# Patient Record
Sex: Male | Born: 1935
Health system: Southern US, Community
[De-identification: ages and names within clinical notes are randomized; demographics above are authoritative.]

## PROBLEM LIST (undated history)

## (undated) DIAGNOSIS — Z8521 Personal history of malignant neoplasm of larynx: Secondary | ICD-10-CM

## (undated) DIAGNOSIS — G4733 Obstructive sleep apnea (adult) (pediatric): Secondary | ICD-10-CM

## (undated) DIAGNOSIS — L570 Actinic keratosis: Secondary | ICD-10-CM

## (undated) DIAGNOSIS — K219 Gastro-esophageal reflux disease without esophagitis: Secondary | ICD-10-CM

## (undated) DIAGNOSIS — I10 Essential (primary) hypertension: Secondary | ICD-10-CM

## (undated) DIAGNOSIS — M109 Gout, unspecified: Secondary | ICD-10-CM

## (undated) DIAGNOSIS — J302 Other seasonal allergic rhinitis: Secondary | ICD-10-CM

## (undated) DIAGNOSIS — R7303 Prediabetes: Secondary | ICD-10-CM

## (undated) DIAGNOSIS — Z85828 Personal history of other malignant neoplasm of skin: Secondary | ICD-10-CM

## (undated) DIAGNOSIS — Z9989 Dependence on other enabling machines and devices: Principal | ICD-10-CM

## (undated) DIAGNOSIS — M1990 Unspecified osteoarthritis, unspecified site: Secondary | ICD-10-CM

## (undated) DIAGNOSIS — T8859XA Other complications of anesthesia, initial encounter: Secondary | ICD-10-CM

## (undated) DIAGNOSIS — K5792 Diverticulitis of intestine, part unspecified, without perforation or abscess without bleeding: Secondary | ICD-10-CM

## (undated) DIAGNOSIS — H903 Sensorineural hearing loss, bilateral: Secondary | ICD-10-CM

## (undated) DIAGNOSIS — Z8739 Personal history of other diseases of the musculoskeletal system and connective tissue: Secondary | ICD-10-CM

## (undated) DIAGNOSIS — C4492 Squamous cell carcinoma of skin, unspecified: Secondary | ICD-10-CM

## (undated) DIAGNOSIS — I6523 Occlusion and stenosis of bilateral carotid arteries: Secondary | ICD-10-CM

## (undated) DIAGNOSIS — T4145XA Adverse effect of unspecified anesthetic, initial encounter: Secondary | ICD-10-CM

## (undated) HISTORY — DX: Personal history of other malignant neoplasm of skin: Z85.828

## (undated) HISTORY — DX: Unspecified osteoarthritis, unspecified site: M19.90

## (undated) HISTORY — PX: APPENDECTOMY: SHX54

## (undated) HISTORY — DX: Squamous cell carcinoma of skin, unspecified: C44.92

## (undated) HISTORY — DX: Personal history of malignant neoplasm of larynx: Z85.21

## (undated) HISTORY — PX: COLONOSCOPY: SHX174

## (undated) HISTORY — DX: Other seasonal allergic rhinitis: J30.2

## (undated) HISTORY — DX: Gastro-esophageal reflux disease without esophagitis: K21.9

## (undated) HISTORY — DX: Actinic keratosis: L57.0

## (undated) HISTORY — DX: Gout, unspecified: M10.9

## (undated) HISTORY — DX: Sensorineural hearing loss, bilateral: H90.3

## (undated) HISTORY — DX: Essential (primary) hypertension: I10

## (undated) HISTORY — PX: ACHILLES TENDON REPAIR: SUR1153

## (undated) HISTORY — PX: TREATMENT FISTULA ANAL: SUR1390

## (undated) HISTORY — PX: TONSILLECTOMY: SUR1361

## (undated) HISTORY — DX: Obstructive sleep apnea (adult) (pediatric): G47.33

## (undated) HISTORY — PX: CHOLECYSTECTOMY: SHX55

## (undated) HISTORY — DX: Diverticulitis of intestine, part unspecified, without perforation or abscess without bleeding: K57.92

## (undated) HISTORY — DX: Dependence on other enabling machines and devices: Z99.89

## (undated) HISTORY — DX: Personal history of other diseases of the musculoskeletal system and connective tissue: Z87.39

## (undated) HISTORY — DX: Occlusion and stenosis of bilateral carotid arteries: I65.23

---
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
## 2007-05-09 DIAGNOSIS — Z8521 Personal history of malignant neoplasm of larynx: Secondary | ICD-10-CM

## 2007-05-09 HISTORY — DX: Personal history of malignant neoplasm of larynx: Z85.21

## 2007-05-09 HISTORY — PX: THROAT SURGERY: SHX803

## 2013-05-08 DIAGNOSIS — L089 Local infection of the skin and subcutaneous tissue, unspecified: Secondary | ICD-10-CM | POA: Insufficient documentation

## 2013-05-08 DIAGNOSIS — B9562 Methicillin resistant Staphylococcus aureus infection as the cause of diseases classified elsewhere: Secondary | ICD-10-CM

## 2013-05-08 HISTORY — DX: Methicillin resistant Staphylococcus aureus infection as the cause of diseases classified elsewhere: B95.62

## 2013-05-08 HISTORY — DX: Local infection of the skin and subcutaneous tissue, unspecified: L08.9

## 2014-05-09 DIAGNOSIS — J019 Acute sinusitis, unspecified: Secondary | ICD-10-CM | POA: Diagnosis not present

## 2014-05-09 DIAGNOSIS — J209 Acute bronchitis, unspecified: Secondary | ICD-10-CM | POA: Diagnosis not present

## 2014-05-26 DIAGNOSIS — J209 Acute bronchitis, unspecified: Secondary | ICD-10-CM | POA: Diagnosis not present

## 2014-06-04 DIAGNOSIS — H33311 Horseshoe tear of retina without detachment, right eye: Secondary | ICD-10-CM | POA: Diagnosis not present

## 2014-06-04 DIAGNOSIS — H43813 Vitreous degeneration, bilateral: Secondary | ICD-10-CM | POA: Diagnosis not present

## 2014-06-04 DIAGNOSIS — D3131 Benign neoplasm of right choroid: Secondary | ICD-10-CM | POA: Diagnosis not present

## 2014-07-01 DIAGNOSIS — J069 Acute upper respiratory infection, unspecified: Secondary | ICD-10-CM | POA: Diagnosis not present

## 2014-07-14 DIAGNOSIS — L570 Actinic keratosis: Secondary | ICD-10-CM | POA: Diagnosis not present

## 2014-07-16 DIAGNOSIS — I10 Essential (primary) hypertension: Secondary | ICD-10-CM | POA: Diagnosis not present

## 2014-07-16 DIAGNOSIS — R6889 Other general symptoms and signs: Secondary | ICD-10-CM | POA: Diagnosis not present

## 2014-07-16 DIAGNOSIS — M1000 Idiopathic gout, unspecified site: Secondary | ICD-10-CM | POA: Diagnosis not present

## 2014-07-16 DIAGNOSIS — Z79899 Other long term (current) drug therapy: Secondary | ICD-10-CM | POA: Diagnosis not present

## 2014-07-21 DIAGNOSIS — M1000 Idiopathic gout, unspecified site: Secondary | ICD-10-CM | POA: Diagnosis not present

## 2014-07-21 DIAGNOSIS — I10 Essential (primary) hypertension: Secondary | ICD-10-CM | POA: Diagnosis not present

## 2014-08-03 DIAGNOSIS — S335XXA Sprain of ligaments of lumbar spine, initial encounter: Secondary | ICD-10-CM | POA: Diagnosis not present

## 2014-08-03 DIAGNOSIS — S134XXA Sprain of ligaments of cervical spine, initial encounter: Secondary | ICD-10-CM | POA: Diagnosis not present

## 2014-08-03 DIAGNOSIS — M9903 Segmental and somatic dysfunction of lumbar region: Secondary | ICD-10-CM | POA: Diagnosis not present

## 2014-08-03 DIAGNOSIS — M9905 Segmental and somatic dysfunction of pelvic region: Secondary | ICD-10-CM | POA: Diagnosis not present

## 2014-08-03 DIAGNOSIS — M9901 Segmental and somatic dysfunction of cervical region: Secondary | ICD-10-CM | POA: Diagnosis not present

## 2014-08-03 DIAGNOSIS — M5441 Lumbago with sciatica, right side: Secondary | ICD-10-CM | POA: Diagnosis not present

## 2014-08-04 DIAGNOSIS — M25551 Pain in right hip: Secondary | ICD-10-CM | POA: Diagnosis not present

## 2014-08-05 DIAGNOSIS — T148 Other injury of unspecified body region: Secondary | ICD-10-CM | POA: Diagnosis not present

## 2014-11-08 DIAGNOSIS — T25299A Burn of second degree of multiple sites of unspecified ankle and foot, initial encounter: Secondary | ICD-10-CM | POA: Diagnosis not present

## 2014-11-10 DIAGNOSIS — T25299D Burn of second degree of multiple sites of unspecified ankle and foot, subsequent encounter: Secondary | ICD-10-CM | POA: Diagnosis not present

## 2014-12-14 DIAGNOSIS — Z961 Presence of intraocular lens: Secondary | ICD-10-CM | POA: Diagnosis not present

## 2014-12-14 DIAGNOSIS — H04123 Dry eye syndrome of bilateral lacrimal glands: Secondary | ICD-10-CM | POA: Diagnosis not present

## 2014-12-24 DIAGNOSIS — C320 Malignant neoplasm of glottis: Secondary | ICD-10-CM | POA: Diagnosis not present

## 2015-01-25 DIAGNOSIS — H905 Unspecified sensorineural hearing loss: Secondary | ICD-10-CM | POA: Diagnosis not present

## 2015-01-25 DIAGNOSIS — H903 Sensorineural hearing loss, bilateral: Secondary | ICD-10-CM | POA: Diagnosis not present

## 2015-02-16 DIAGNOSIS — B356 Tinea cruris: Secondary | ICD-10-CM | POA: Diagnosis not present

## 2015-02-16 DIAGNOSIS — L821 Other seborrheic keratosis: Secondary | ICD-10-CM | POA: Diagnosis not present

## 2015-02-16 DIAGNOSIS — L570 Actinic keratosis: Secondary | ICD-10-CM | POA: Diagnosis not present

## 2015-02-17 DIAGNOSIS — M9905 Segmental and somatic dysfunction of pelvic region: Secondary | ICD-10-CM | POA: Diagnosis not present

## 2015-02-17 DIAGNOSIS — M62830 Muscle spasm of back: Secondary | ICD-10-CM | POA: Diagnosis not present

## 2015-02-17 DIAGNOSIS — M5441 Lumbago with sciatica, right side: Secondary | ICD-10-CM | POA: Diagnosis not present

## 2015-02-17 DIAGNOSIS — M9903 Segmental and somatic dysfunction of lumbar region: Secondary | ICD-10-CM | POA: Diagnosis not present

## 2015-03-04 DIAGNOSIS — Z23 Encounter for immunization: Secondary | ICD-10-CM | POA: Diagnosis not present

## 2015-03-05 DIAGNOSIS — I10 Essential (primary) hypertension: Secondary | ICD-10-CM | POA: Diagnosis not present

## 2015-03-05 DIAGNOSIS — X030XXA Exposure to flames in controlled fire, not in building or structure, initial encounter: Secondary | ICD-10-CM | POA: Diagnosis not present

## 2015-03-05 DIAGNOSIS — J309 Allergic rhinitis, unspecified: Secondary | ICD-10-CM | POA: Diagnosis not present

## 2015-03-05 DIAGNOSIS — T25232A Burn of second degree of left toe(s) (nail), initial encounter: Secondary | ICD-10-CM | POA: Diagnosis not present

## 2015-03-05 DIAGNOSIS — Z79899 Other long term (current) drug therapy: Secondary | ICD-10-CM | POA: Diagnosis not present

## 2015-03-05 DIAGNOSIS — C320 Malignant neoplasm of glottis: Secondary | ICD-10-CM | POA: Diagnosis not present

## 2015-03-05 DIAGNOSIS — M109 Gout, unspecified: Secondary | ICD-10-CM | POA: Diagnosis not present

## 2015-03-05 DIAGNOSIS — T310 Burns involving less than 10% of body surface: Secondary | ICD-10-CM | POA: Diagnosis not present

## 2015-03-05 DIAGNOSIS — T25231A Burn of second degree of right toe(s) (nail), initial encounter: Secondary | ICD-10-CM | POA: Diagnosis not present

## 2015-03-05 DIAGNOSIS — M1000 Idiopathic gout, unspecified site: Secondary | ICD-10-CM | POA: Diagnosis not present

## 2015-03-05 DIAGNOSIS — T25022A Burn of unspecified degree of left foot, initial encounter: Secondary | ICD-10-CM | POA: Diagnosis not present

## 2015-03-05 DIAGNOSIS — T25021A Burn of unspecified degree of right foot, initial encounter: Secondary | ICD-10-CM | POA: Diagnosis not present

## 2015-03-12 DIAGNOSIS — I10 Essential (primary) hypertension: Secondary | ICD-10-CM | POA: Diagnosis not present

## 2015-03-12 DIAGNOSIS — R7309 Other abnormal glucose: Secondary | ICD-10-CM | POA: Diagnosis not present

## 2015-03-12 LAB — CBC AND DIFFERENTIAL
HEMOGLOBIN: 13.2 g/dL — AB (ref 13.5–17.5)
PLATELETS: 224 10*3/uL (ref 150–399)
WBC: 4.7 10^3/mL

## 2015-03-12 LAB — VITAMIN D 25 HYDROXY (VIT D DEFICIENCY, FRACTURES): VIT D 25 HYDROXY: 39.4

## 2015-03-12 LAB — LIPID PANEL
Cholesterol: 137 mg/dL (ref 0–200)
HDL: 34 mg/dL — AB (ref 35–70)
LDL CALC: 68 mg/dL
Triglycerides: 173 mg/dL — AB (ref 40–160)

## 2015-03-12 LAB — HEPATIC FUNCTION PANEL
ALK PHOS: 53 U/L (ref 25–125)
ALT: 37 U/L (ref 10–40)
AST: 38 U/L (ref 14–40)
BILIRUBIN, TOTAL: 0.7 mg/dL

## 2015-03-12 LAB — TSH: TSH: 3.42 u[IU]/mL (ref 0.41–5.90)

## 2015-03-12 LAB — BASIC METABOLIC PANEL: CREATININE: 1 mg/dL (ref <=1.3)

## 2015-03-12 LAB — HEMOGLOBIN A1C: Hemoglobin A1C: 5.9

## 2015-03-17 DIAGNOSIS — M62830 Muscle spasm of back: Secondary | ICD-10-CM | POA: Diagnosis not present

## 2015-03-17 DIAGNOSIS — M5441 Lumbago with sciatica, right side: Secondary | ICD-10-CM | POA: Diagnosis not present

## 2015-03-17 DIAGNOSIS — M9905 Segmental and somatic dysfunction of pelvic region: Secondary | ICD-10-CM | POA: Diagnosis not present

## 2015-03-17 DIAGNOSIS — M9903 Segmental and somatic dysfunction of lumbar region: Secondary | ICD-10-CM | POA: Diagnosis not present

## 2015-03-18 DIAGNOSIS — M109 Gout, unspecified: Secondary | ICD-10-CM | POA: Diagnosis not present

## 2015-03-18 DIAGNOSIS — T25331A Burn of third degree of right toe(s) (nail), initial encounter: Secondary | ICD-10-CM | POA: Diagnosis not present

## 2015-03-18 DIAGNOSIS — T25131A Burn of first degree of right toe(s) (nail), initial encounter: Secondary | ICD-10-CM | POA: Diagnosis not present

## 2015-03-18 DIAGNOSIS — T25332A Burn of third degree of left toe(s) (nail), initial encounter: Secondary | ICD-10-CM | POA: Diagnosis not present

## 2015-03-18 DIAGNOSIS — I10 Essential (primary) hypertension: Secondary | ICD-10-CM | POA: Diagnosis not present

## 2015-03-18 DIAGNOSIS — T310 Burns involving less than 10% of body surface: Secondary | ICD-10-CM | POA: Diagnosis not present

## 2015-03-22 DIAGNOSIS — M5441 Lumbago with sciatica, right side: Secondary | ICD-10-CM | POA: Diagnosis not present

## 2015-03-22 DIAGNOSIS — M9905 Segmental and somatic dysfunction of pelvic region: Secondary | ICD-10-CM | POA: Diagnosis not present

## 2015-03-22 DIAGNOSIS — M9903 Segmental and somatic dysfunction of lumbar region: Secondary | ICD-10-CM | POA: Diagnosis not present

## 2015-03-22 DIAGNOSIS — M62830 Muscle spasm of back: Secondary | ICD-10-CM | POA: Diagnosis not present

## 2015-04-08 DIAGNOSIS — T25331A Burn of third degree of right toe(s) (nail), initial encounter: Secondary | ICD-10-CM | POA: Diagnosis not present

## 2015-04-08 DIAGNOSIS — I10 Essential (primary) hypertension: Secondary | ICD-10-CM | POA: Diagnosis not present

## 2015-04-08 DIAGNOSIS — T25332A Burn of third degree of left toe(s) (nail), initial encounter: Secondary | ICD-10-CM | POA: Diagnosis not present

## 2015-04-08 DIAGNOSIS — T310 Burns involving less than 10% of body surface: Secondary | ICD-10-CM | POA: Diagnosis not present

## 2015-04-08 DIAGNOSIS — M109 Gout, unspecified: Secondary | ICD-10-CM | POA: Diagnosis not present

## 2015-04-08 DIAGNOSIS — T25131A Burn of first degree of right toe(s) (nail), initial encounter: Secondary | ICD-10-CM | POA: Diagnosis not present

## 2015-04-16 DIAGNOSIS — B001 Herpesviral vesicular dermatitis: Secondary | ICD-10-CM | POA: Diagnosis not present

## 2015-04-16 DIAGNOSIS — L309 Dermatitis, unspecified: Secondary | ICD-10-CM | POA: Diagnosis not present

## 2015-04-22 DIAGNOSIS — T25332A Burn of third degree of left toe(s) (nail), initial encounter: Secondary | ICD-10-CM | POA: Diagnosis not present

## 2015-04-22 DIAGNOSIS — T25131A Burn of first degree of right toe(s) (nail), initial encounter: Secondary | ICD-10-CM | POA: Diagnosis not present

## 2015-04-22 DIAGNOSIS — T25331A Burn of third degree of right toe(s) (nail), initial encounter: Secondary | ICD-10-CM | POA: Diagnosis not present

## 2015-04-22 DIAGNOSIS — I10 Essential (primary) hypertension: Secondary | ICD-10-CM | POA: Diagnosis not present

## 2015-04-22 DIAGNOSIS — T310 Burns involving less than 10% of body surface: Secondary | ICD-10-CM | POA: Diagnosis not present

## 2015-04-22 DIAGNOSIS — M109 Gout, unspecified: Secondary | ICD-10-CM | POA: Diagnosis not present

## 2015-05-11 DIAGNOSIS — M9903 Segmental and somatic dysfunction of lumbar region: Secondary | ICD-10-CM | POA: Diagnosis not present

## 2015-05-11 DIAGNOSIS — I10 Essential (primary) hypertension: Secondary | ICD-10-CM | POA: Diagnosis not present

## 2015-05-11 DIAGNOSIS — M9905 Segmental and somatic dysfunction of pelvic region: Secondary | ICD-10-CM | POA: Diagnosis not present

## 2015-05-11 DIAGNOSIS — S39012A Strain of muscle, fascia and tendon of lower back, initial encounter: Secondary | ICD-10-CM | POA: Diagnosis not present

## 2015-05-11 DIAGNOSIS — J309 Allergic rhinitis, unspecified: Secondary | ICD-10-CM | POA: Diagnosis not present

## 2015-05-11 DIAGNOSIS — C320 Malignant neoplasm of glottis: Secondary | ICD-10-CM | POA: Diagnosis not present

## 2015-05-11 DIAGNOSIS — M5441 Lumbago with sciatica, right side: Secondary | ICD-10-CM | POA: Diagnosis not present

## 2015-05-13 DIAGNOSIS — M5441 Lumbago with sciatica, right side: Secondary | ICD-10-CM | POA: Diagnosis not present

## 2015-05-13 DIAGNOSIS — M9905 Segmental and somatic dysfunction of pelvic region: Secondary | ICD-10-CM | POA: Diagnosis not present

## 2015-05-13 DIAGNOSIS — S39012A Strain of muscle, fascia and tendon of lower back, initial encounter: Secondary | ICD-10-CM | POA: Diagnosis not present

## 2015-05-13 DIAGNOSIS — M9903 Segmental and somatic dysfunction of lumbar region: Secondary | ICD-10-CM | POA: Diagnosis not present

## 2015-05-17 DIAGNOSIS — M9903 Segmental and somatic dysfunction of lumbar region: Secondary | ICD-10-CM | POA: Diagnosis not present

## 2015-05-17 DIAGNOSIS — M9905 Segmental and somatic dysfunction of pelvic region: Secondary | ICD-10-CM | POA: Diagnosis not present

## 2015-05-17 DIAGNOSIS — S39012A Strain of muscle, fascia and tendon of lower back, initial encounter: Secondary | ICD-10-CM | POA: Diagnosis not present

## 2015-05-17 DIAGNOSIS — M5441 Lumbago with sciatica, right side: Secondary | ICD-10-CM | POA: Diagnosis not present

## 2015-05-18 DIAGNOSIS — T25332A Burn of third degree of left toe(s) (nail), initial encounter: Secondary | ICD-10-CM | POA: Diagnosis not present

## 2015-05-18 DIAGNOSIS — T310 Burns involving less than 10% of body surface: Secondary | ICD-10-CM | POA: Diagnosis not present

## 2015-05-18 DIAGNOSIS — T25331A Burn of third degree of right toe(s) (nail), initial encounter: Secondary | ICD-10-CM | POA: Diagnosis not present

## 2015-05-18 DIAGNOSIS — Z79899 Other long term (current) drug therapy: Secondary | ICD-10-CM | POA: Diagnosis not present

## 2015-05-18 DIAGNOSIS — I10 Essential (primary) hypertension: Secondary | ICD-10-CM | POA: Diagnosis not present

## 2015-05-18 DIAGNOSIS — M109 Gout, unspecified: Secondary | ICD-10-CM | POA: Diagnosis not present

## 2015-05-20 DIAGNOSIS — M5441 Lumbago with sciatica, right side: Secondary | ICD-10-CM | POA: Diagnosis not present

## 2015-05-20 DIAGNOSIS — M9905 Segmental and somatic dysfunction of pelvic region: Secondary | ICD-10-CM | POA: Diagnosis not present

## 2015-05-20 DIAGNOSIS — M9903 Segmental and somatic dysfunction of lumbar region: Secondary | ICD-10-CM | POA: Diagnosis not present

## 2015-05-20 DIAGNOSIS — S39012A Strain of muscle, fascia and tendon of lower back, initial encounter: Secondary | ICD-10-CM | POA: Diagnosis not present

## 2015-06-08 DIAGNOSIS — Z79899 Other long term (current) drug therapy: Secondary | ICD-10-CM | POA: Diagnosis not present

## 2015-06-08 DIAGNOSIS — T25331A Burn of third degree of right toe(s) (nail), initial encounter: Secondary | ICD-10-CM | POA: Diagnosis not present

## 2015-06-08 DIAGNOSIS — T310 Burns involving less than 10% of body surface: Secondary | ICD-10-CM | POA: Diagnosis not present

## 2015-06-08 DIAGNOSIS — T25332A Burn of third degree of left toe(s) (nail), initial encounter: Secondary | ICD-10-CM | POA: Diagnosis not present

## 2015-06-08 DIAGNOSIS — I10 Essential (primary) hypertension: Secondary | ICD-10-CM | POA: Diagnosis not present

## 2015-06-08 DIAGNOSIS — M109 Gout, unspecified: Secondary | ICD-10-CM | POA: Diagnosis not present

## 2015-06-24 DIAGNOSIS — I10 Essential (primary) hypertension: Secondary | ICD-10-CM | POA: Diagnosis not present

## 2015-06-24 DIAGNOSIS — T25332A Burn of third degree of left toe(s) (nail), initial encounter: Secondary | ICD-10-CM | POA: Diagnosis not present

## 2015-06-24 DIAGNOSIS — T310 Burns involving less than 10% of body surface: Secondary | ICD-10-CM | POA: Diagnosis not present

## 2015-06-24 DIAGNOSIS — M109 Gout, unspecified: Secondary | ICD-10-CM | POA: Diagnosis not present

## 2015-06-24 DIAGNOSIS — T25331A Burn of third degree of right toe(s) (nail), initial encounter: Secondary | ICD-10-CM | POA: Diagnosis not present

## 2015-06-28 DIAGNOSIS — J069 Acute upper respiratory infection, unspecified: Secondary | ICD-10-CM | POA: Diagnosis not present

## 2015-07-22 DIAGNOSIS — E669 Obesity, unspecified: Secondary | ICD-10-CM | POA: Diagnosis not present

## 2015-07-22 DIAGNOSIS — I10 Essential (primary) hypertension: Secondary | ICD-10-CM | POA: Diagnosis not present

## 2015-07-22 DIAGNOSIS — T310 Burns involving less than 10% of body surface: Secondary | ICD-10-CM | POA: Diagnosis not present

## 2015-07-22 DIAGNOSIS — T25331A Burn of third degree of right toe(s) (nail), initial encounter: Secondary | ICD-10-CM | POA: Diagnosis not present

## 2015-07-22 DIAGNOSIS — M109 Gout, unspecified: Secondary | ICD-10-CM | POA: Diagnosis not present

## 2015-07-22 DIAGNOSIS — T25332A Burn of third degree of left toe(s) (nail), initial encounter: Secondary | ICD-10-CM | POA: Diagnosis not present

## 2015-07-22 DIAGNOSIS — Z79899 Other long term (current) drug therapy: Secondary | ICD-10-CM | POA: Diagnosis not present

## 2015-07-27 DIAGNOSIS — L570 Actinic keratosis: Secondary | ICD-10-CM | POA: Diagnosis not present

## 2015-07-27 DIAGNOSIS — L821 Other seborrheic keratosis: Secondary | ICD-10-CM | POA: Diagnosis not present

## 2015-08-30 DIAGNOSIS — J019 Acute sinusitis, unspecified: Secondary | ICD-10-CM | POA: Diagnosis not present

## 2015-09-17 DIAGNOSIS — M86172 Other acute osteomyelitis, left ankle and foot: Secondary | ICD-10-CM | POA: Diagnosis not present

## 2015-09-17 DIAGNOSIS — M868X7 Other osteomyelitis, ankle and foot: Secondary | ICD-10-CM | POA: Diagnosis present

## 2015-09-17 DIAGNOSIS — T25331S Burn of third degree of right toe(s) (nail), sequela: Secondary | ICD-10-CM | POA: Diagnosis not present

## 2015-09-17 DIAGNOSIS — M868X8 Other osteomyelitis, other site: Secondary | ICD-10-CM | POA: Diagnosis not present

## 2015-09-17 DIAGNOSIS — I10 Essential (primary) hypertension: Secondary | ICD-10-CM | POA: Diagnosis present

## 2015-09-17 DIAGNOSIS — L97529 Non-pressure chronic ulcer of other part of left foot with unspecified severity: Secondary | ICD-10-CM | POA: Diagnosis not present

## 2015-09-17 DIAGNOSIS — T25332S Burn of third degree of left toe(s) (nail), sequela: Secondary | ICD-10-CM | POA: Diagnosis not present

## 2015-09-17 DIAGNOSIS — R600 Localized edema: Secondary | ICD-10-CM | POA: Diagnosis not present

## 2015-09-17 DIAGNOSIS — X038XXS Other exposure to controlled fire, not in building or structure, sequela: Secondary | ICD-10-CM | POA: Diagnosis not present

## 2015-09-17 DIAGNOSIS — L089 Local infection of the skin and subcutaneous tissue, unspecified: Secondary | ICD-10-CM | POA: Diagnosis not present

## 2015-09-17 DIAGNOSIS — L03032 Cellulitis of left toe: Secondary | ICD-10-CM | POA: Diagnosis present

## 2015-09-17 DIAGNOSIS — M109 Gout, unspecified: Secondary | ICD-10-CM | POA: Diagnosis present

## 2015-09-20 DIAGNOSIS — M868X7 Other osteomyelitis, ankle and foot: Secondary | ICD-10-CM | POA: Diagnosis not present

## 2015-09-20 DIAGNOSIS — L03032 Cellulitis of left toe: Secondary | ICD-10-CM | POA: Diagnosis not present

## 2015-09-20 DIAGNOSIS — L089 Local infection of the skin and subcutaneous tissue, unspecified: Secondary | ICD-10-CM | POA: Diagnosis not present

## 2015-09-20 DIAGNOSIS — T25332S Burn of third degree of left toe(s) (nail), sequela: Secondary | ICD-10-CM | POA: Diagnosis not present

## 2015-09-22 DIAGNOSIS — M86172 Other acute osteomyelitis, left ankle and foot: Secondary | ICD-10-CM | POA: Diagnosis not present

## 2015-09-23 DIAGNOSIS — M86172 Other acute osteomyelitis, left ankle and foot: Secondary | ICD-10-CM | POA: Diagnosis not present

## 2015-09-24 DIAGNOSIS — L603 Nail dystrophy: Secondary | ICD-10-CM | POA: Diagnosis not present

## 2015-09-24 DIAGNOSIS — L03032 Cellulitis of left toe: Secondary | ICD-10-CM | POA: Diagnosis not present

## 2015-09-24 DIAGNOSIS — T25332S Burn of third degree of left toe(s) (nail), sequela: Secondary | ICD-10-CM | POA: Diagnosis not present

## 2015-09-24 DIAGNOSIS — M86172 Other acute osteomyelitis, left ankle and foot: Secondary | ICD-10-CM | POA: Diagnosis not present

## 2015-09-24 DIAGNOSIS — I10 Essential (primary) hypertension: Secondary | ICD-10-CM | POA: Diagnosis not present

## 2015-09-24 DIAGNOSIS — B9689 Other specified bacterial agents as the cause of diseases classified elsewhere: Secondary | ICD-10-CM | POA: Diagnosis not present

## 2015-09-24 DIAGNOSIS — M86171 Other acute osteomyelitis, right ankle and foot: Secondary | ICD-10-CM | POA: Diagnosis not present

## 2015-09-24 DIAGNOSIS — S91102A Unspecified open wound of left great toe without damage to nail, initial encounter: Secondary | ICD-10-CM | POA: Diagnosis not present

## 2015-09-25 DIAGNOSIS — M86172 Other acute osteomyelitis, left ankle and foot: Secondary | ICD-10-CM | POA: Diagnosis not present

## 2015-09-26 DIAGNOSIS — M86172 Other acute osteomyelitis, left ankle and foot: Secondary | ICD-10-CM | POA: Diagnosis not present

## 2015-09-27 DIAGNOSIS — M86172 Other acute osteomyelitis, left ankle and foot: Secondary | ICD-10-CM | POA: Diagnosis not present

## 2015-09-28 DIAGNOSIS — M86172 Other acute osteomyelitis, left ankle and foot: Secondary | ICD-10-CM | POA: Diagnosis not present

## 2015-09-29 DIAGNOSIS — M86172 Other acute osteomyelitis, left ankle and foot: Secondary | ICD-10-CM | POA: Diagnosis not present

## 2015-09-30 DIAGNOSIS — M86172 Other acute osteomyelitis, left ankle and foot: Secondary | ICD-10-CM | POA: Diagnosis not present

## 2015-10-01 DIAGNOSIS — S91102A Unspecified open wound of left great toe without damage to nail, initial encounter: Secondary | ICD-10-CM | POA: Diagnosis not present

## 2015-10-01 DIAGNOSIS — Z923 Personal history of irradiation: Secondary | ICD-10-CM | POA: Diagnosis not present

## 2015-10-01 DIAGNOSIS — T25332S Burn of third degree of left toe(s) (nail), sequela: Secondary | ICD-10-CM | POA: Diagnosis not present

## 2015-10-01 DIAGNOSIS — B9689 Other specified bacterial agents as the cause of diseases classified elsewhere: Secondary | ICD-10-CM | POA: Diagnosis not present

## 2015-10-01 DIAGNOSIS — I10 Essential (primary) hypertension: Secondary | ICD-10-CM | POA: Diagnosis not present

## 2015-10-01 DIAGNOSIS — M86172 Other acute osteomyelitis, left ankle and foot: Secondary | ICD-10-CM | POA: Diagnosis not present

## 2015-10-01 DIAGNOSIS — L03032 Cellulitis of left toe: Secondary | ICD-10-CM | POA: Diagnosis not present

## 2015-10-01 DIAGNOSIS — M86171 Other acute osteomyelitis, right ankle and foot: Secondary | ICD-10-CM | POA: Diagnosis not present

## 2015-10-02 DIAGNOSIS — M86172 Other acute osteomyelitis, left ankle and foot: Secondary | ICD-10-CM | POA: Diagnosis not present

## 2015-10-03 DIAGNOSIS — M86172 Other acute osteomyelitis, left ankle and foot: Secondary | ICD-10-CM | POA: Diagnosis not present

## 2015-10-04 DIAGNOSIS — M86172 Other acute osteomyelitis, left ankle and foot: Secondary | ICD-10-CM | POA: Diagnosis not present

## 2015-10-05 DIAGNOSIS — L03032 Cellulitis of left toe: Secondary | ICD-10-CM | POA: Diagnosis not present

## 2015-10-05 DIAGNOSIS — B9689 Other specified bacterial agents as the cause of diseases classified elsewhere: Secondary | ICD-10-CM | POA: Diagnosis not present

## 2015-10-05 DIAGNOSIS — M86172 Other acute osteomyelitis, left ankle and foot: Secondary | ICD-10-CM | POA: Diagnosis not present

## 2015-10-05 DIAGNOSIS — I10 Essential (primary) hypertension: Secondary | ICD-10-CM | POA: Diagnosis not present

## 2015-10-05 DIAGNOSIS — T25332S Burn of third degree of left toe(s) (nail), sequela: Secondary | ICD-10-CM | POA: Diagnosis not present

## 2015-10-05 DIAGNOSIS — S91102A Unspecified open wound of left great toe without damage to nail, initial encounter: Secondary | ICD-10-CM | POA: Diagnosis not present

## 2015-10-06 DIAGNOSIS — I10 Essential (primary) hypertension: Secondary | ICD-10-CM | POA: Diagnosis not present

## 2015-10-06 DIAGNOSIS — B9689 Other specified bacterial agents as the cause of diseases classified elsewhere: Secondary | ICD-10-CM | POA: Diagnosis not present

## 2015-10-06 DIAGNOSIS — S91102A Unspecified open wound of left great toe without damage to nail, initial encounter: Secondary | ICD-10-CM | POA: Diagnosis not present

## 2015-10-06 DIAGNOSIS — M86172 Other acute osteomyelitis, left ankle and foot: Secondary | ICD-10-CM | POA: Diagnosis not present

## 2015-10-06 DIAGNOSIS — T25332S Burn of third degree of left toe(s) (nail), sequela: Secondary | ICD-10-CM | POA: Diagnosis not present

## 2015-10-06 DIAGNOSIS — L03032 Cellulitis of left toe: Secondary | ICD-10-CM | POA: Diagnosis not present

## 2015-10-07 DIAGNOSIS — L84 Corns and callosities: Secondary | ICD-10-CM | POA: Diagnosis not present

## 2015-10-07 DIAGNOSIS — L603 Nail dystrophy: Secondary | ICD-10-CM | POA: Diagnosis not present

## 2015-10-07 DIAGNOSIS — B9689 Other specified bacterial agents as the cause of diseases classified elsewhere: Secondary | ICD-10-CM | POA: Diagnosis not present

## 2015-10-07 DIAGNOSIS — Z8739 Personal history of other diseases of the musculoskeletal system and connective tissue: Secondary | ICD-10-CM

## 2015-10-07 DIAGNOSIS — I10 Essential (primary) hypertension: Secondary | ICD-10-CM | POA: Diagnosis not present

## 2015-10-07 DIAGNOSIS — L03032 Cellulitis of left toe: Secondary | ICD-10-CM | POA: Diagnosis not present

## 2015-10-07 DIAGNOSIS — T25332S Burn of third degree of left toe(s) (nail), sequela: Secondary | ICD-10-CM | POA: Diagnosis not present

## 2015-10-07 DIAGNOSIS — S91102A Unspecified open wound of left great toe without damage to nail, initial encounter: Secondary | ICD-10-CM | POA: Diagnosis not present

## 2015-10-07 DIAGNOSIS — M86172 Other acute osteomyelitis, left ankle and foot: Secondary | ICD-10-CM | POA: Diagnosis not present

## 2015-10-07 HISTORY — DX: Personal history of other diseases of the musculoskeletal system and connective tissue: Z87.39

## 2015-10-12 DIAGNOSIS — T25332S Burn of third degree of left toe(s) (nail), sequela: Secondary | ICD-10-CM | POA: Diagnosis not present

## 2015-10-12 DIAGNOSIS — I10 Essential (primary) hypertension: Secondary | ICD-10-CM | POA: Diagnosis not present

## 2015-10-12 DIAGNOSIS — B9689 Other specified bacterial agents as the cause of diseases classified elsewhere: Secondary | ICD-10-CM | POA: Diagnosis not present

## 2015-10-12 DIAGNOSIS — S91102A Unspecified open wound of left great toe without damage to nail, initial encounter: Secondary | ICD-10-CM | POA: Diagnosis not present

## 2015-10-12 DIAGNOSIS — L03032 Cellulitis of left toe: Secondary | ICD-10-CM | POA: Diagnosis not present

## 2015-10-12 DIAGNOSIS — M86172 Other acute osteomyelitis, left ankle and foot: Secondary | ICD-10-CM | POA: Diagnosis not present

## 2015-10-15 DIAGNOSIS — S91102A Unspecified open wound of left great toe without damage to nail, initial encounter: Secondary | ICD-10-CM | POA: Diagnosis not present

## 2015-10-15 DIAGNOSIS — M86171 Other acute osteomyelitis, right ankle and foot: Secondary | ICD-10-CM | POA: Diagnosis not present

## 2015-10-15 DIAGNOSIS — B9689 Other specified bacterial agents as the cause of diseases classified elsewhere: Secondary | ICD-10-CM | POA: Diagnosis not present

## 2015-10-15 DIAGNOSIS — I10 Essential (primary) hypertension: Secondary | ICD-10-CM | POA: Diagnosis not present

## 2015-10-15 DIAGNOSIS — L03032 Cellulitis of left toe: Secondary | ICD-10-CM | POA: Diagnosis not present

## 2015-10-15 DIAGNOSIS — M86172 Other acute osteomyelitis, left ankle and foot: Secondary | ICD-10-CM | POA: Diagnosis not present

## 2015-10-15 DIAGNOSIS — S91102D Unspecified open wound of left great toe without damage to nail, subsequent encounter: Secondary | ICD-10-CM | POA: Diagnosis not present

## 2015-10-15 DIAGNOSIS — T25332S Burn of third degree of left toe(s) (nail), sequela: Secondary | ICD-10-CM | POA: Diagnosis not present

## 2015-10-18 DIAGNOSIS — S91102A Unspecified open wound of left great toe without damage to nail, initial encounter: Secondary | ICD-10-CM | POA: Diagnosis not present

## 2015-10-18 DIAGNOSIS — L03032 Cellulitis of left toe: Secondary | ICD-10-CM | POA: Diagnosis not present

## 2015-10-18 DIAGNOSIS — M86172 Other acute osteomyelitis, left ankle and foot: Secondary | ICD-10-CM | POA: Diagnosis not present

## 2015-10-18 DIAGNOSIS — I10 Essential (primary) hypertension: Secondary | ICD-10-CM | POA: Diagnosis not present

## 2015-10-18 DIAGNOSIS — T25332S Burn of third degree of left toe(s) (nail), sequela: Secondary | ICD-10-CM | POA: Diagnosis not present

## 2015-10-18 DIAGNOSIS — B9689 Other specified bacterial agents as the cause of diseases classified elsewhere: Secondary | ICD-10-CM | POA: Diagnosis not present

## 2015-10-22 DIAGNOSIS — L03032 Cellulitis of left toe: Secondary | ICD-10-CM | POA: Diagnosis not present

## 2015-10-22 DIAGNOSIS — S91102A Unspecified open wound of left great toe without damage to nail, initial encounter: Secondary | ICD-10-CM | POA: Diagnosis not present

## 2015-10-22 DIAGNOSIS — M86172 Other acute osteomyelitis, left ankle and foot: Secondary | ICD-10-CM | POA: Diagnosis not present

## 2015-10-22 DIAGNOSIS — Z923 Personal history of irradiation: Secondary | ICD-10-CM | POA: Diagnosis not present

## 2015-10-22 DIAGNOSIS — I10 Essential (primary) hypertension: Secondary | ICD-10-CM | POA: Diagnosis not present

## 2015-10-22 DIAGNOSIS — T25332S Burn of third degree of left toe(s) (nail), sequela: Secondary | ICD-10-CM | POA: Diagnosis not present

## 2015-10-22 DIAGNOSIS — S91102D Unspecified open wound of left great toe without damage to nail, subsequent encounter: Secondary | ICD-10-CM | POA: Diagnosis not present

## 2015-10-22 DIAGNOSIS — B9689 Other specified bacterial agents as the cause of diseases classified elsewhere: Secondary | ICD-10-CM | POA: Diagnosis not present

## 2015-10-22 DIAGNOSIS — M86171 Other acute osteomyelitis, right ankle and foot: Secondary | ICD-10-CM | POA: Diagnosis not present

## 2015-10-29 DIAGNOSIS — L03032 Cellulitis of left toe: Secondary | ICD-10-CM | POA: Diagnosis not present

## 2015-10-29 DIAGNOSIS — B9689 Other specified bacterial agents as the cause of diseases classified elsewhere: Secondary | ICD-10-CM | POA: Diagnosis not present

## 2015-10-29 DIAGNOSIS — S91102A Unspecified open wound of left great toe without damage to nail, initial encounter: Secondary | ICD-10-CM | POA: Diagnosis not present

## 2015-10-29 DIAGNOSIS — M86171 Other acute osteomyelitis, right ankle and foot: Secondary | ICD-10-CM | POA: Diagnosis not present

## 2015-10-29 DIAGNOSIS — Z923 Personal history of irradiation: Secondary | ICD-10-CM | POA: Diagnosis not present

## 2015-10-29 DIAGNOSIS — I10 Essential (primary) hypertension: Secondary | ICD-10-CM | POA: Diagnosis not present

## 2015-10-29 DIAGNOSIS — M86172 Other acute osteomyelitis, left ankle and foot: Secondary | ICD-10-CM | POA: Diagnosis not present

## 2015-10-29 DIAGNOSIS — T25332S Burn of third degree of left toe(s) (nail), sequela: Secondary | ICD-10-CM | POA: Diagnosis not present

## 2015-10-29 DIAGNOSIS — S91102D Unspecified open wound of left great toe without damage to nail, subsequent encounter: Secondary | ICD-10-CM | POA: Diagnosis not present

## 2016-02-14 DIAGNOSIS — Z23 Encounter for immunization: Secondary | ICD-10-CM | POA: Diagnosis not present

## 2016-02-25 DIAGNOSIS — M5441 Lumbago with sciatica, right side: Secondary | ICD-10-CM | POA: Diagnosis not present

## 2016-02-25 DIAGNOSIS — M9905 Segmental and somatic dysfunction of pelvic region: Secondary | ICD-10-CM | POA: Diagnosis not present

## 2016-02-25 DIAGNOSIS — M9903 Segmental and somatic dysfunction of lumbar region: Secondary | ICD-10-CM | POA: Diagnosis not present

## 2016-02-25 DIAGNOSIS — M791 Myalgia: Secondary | ICD-10-CM | POA: Diagnosis not present

## 2016-02-28 DIAGNOSIS — M791 Myalgia: Secondary | ICD-10-CM | POA: Diagnosis not present

## 2016-02-28 DIAGNOSIS — M9905 Segmental and somatic dysfunction of pelvic region: Secondary | ICD-10-CM | POA: Diagnosis not present

## 2016-02-28 DIAGNOSIS — M5441 Lumbago with sciatica, right side: Secondary | ICD-10-CM | POA: Diagnosis not present

## 2016-02-28 DIAGNOSIS — M9903 Segmental and somatic dysfunction of lumbar region: Secondary | ICD-10-CM | POA: Diagnosis not present

## 2016-03-09 DIAGNOSIS — M9905 Segmental and somatic dysfunction of pelvic region: Secondary | ICD-10-CM | POA: Diagnosis not present

## 2016-03-09 DIAGNOSIS — M9903 Segmental and somatic dysfunction of lumbar region: Secondary | ICD-10-CM | POA: Diagnosis not present

## 2016-03-09 DIAGNOSIS — M5441 Lumbago with sciatica, right side: Secondary | ICD-10-CM | POA: Diagnosis not present

## 2016-03-09 DIAGNOSIS — M791 Myalgia: Secondary | ICD-10-CM | POA: Diagnosis not present

## 2016-03-13 DIAGNOSIS — M791 Myalgia: Secondary | ICD-10-CM | POA: Diagnosis not present

## 2016-03-13 DIAGNOSIS — M9903 Segmental and somatic dysfunction of lumbar region: Secondary | ICD-10-CM | POA: Diagnosis not present

## 2016-03-13 DIAGNOSIS — M5441 Lumbago with sciatica, right side: Secondary | ICD-10-CM | POA: Diagnosis not present

## 2016-03-13 DIAGNOSIS — M9905 Segmental and somatic dysfunction of pelvic region: Secondary | ICD-10-CM | POA: Diagnosis not present

## 2016-03-16 DIAGNOSIS — D3131 Benign neoplasm of right choroid: Secondary | ICD-10-CM | POA: Diagnosis not present

## 2016-03-16 DIAGNOSIS — H43393 Other vitreous opacities, bilateral: Secondary | ICD-10-CM | POA: Diagnosis not present

## 2016-03-16 DIAGNOSIS — Z961 Presence of intraocular lens: Secondary | ICD-10-CM | POA: Diagnosis not present

## 2016-03-16 DIAGNOSIS — H04123 Dry eye syndrome of bilateral lacrimal glands: Secondary | ICD-10-CM | POA: Diagnosis not present

## 2016-05-05 DIAGNOSIS — J069 Acute upper respiratory infection, unspecified: Secondary | ICD-10-CM | POA: Diagnosis not present

## 2016-05-08 DIAGNOSIS — Z8739 Personal history of other diseases of the musculoskeletal system and connective tissue: Secondary | ICD-10-CM | POA: Insufficient documentation

## 2016-05-22 ENCOUNTER — Ambulatory Visit (INDEPENDENT_AMBULATORY_CARE_PROVIDER_SITE_OTHER): Payer: Medicare Other | Admitting: Family Medicine

## 2016-05-22 ENCOUNTER — Encounter: Payer: Self-pay | Admitting: Family Medicine

## 2016-05-22 VITALS — BP 126/72 | HR 64 | Temp 97.9°F | Ht 69.0 in | Wt 230.0 lb

## 2016-05-22 DIAGNOSIS — K219 Gastro-esophageal reflux disease without esophagitis: Secondary | ICD-10-CM | POA: Diagnosis not present

## 2016-05-22 DIAGNOSIS — Z8739 Personal history of other diseases of the musculoskeletal system and connective tissue: Secondary | ICD-10-CM

## 2016-05-22 DIAGNOSIS — M159 Polyosteoarthritis, unspecified: Secondary | ICD-10-CM

## 2016-05-22 DIAGNOSIS — M1990 Unspecified osteoarthritis, unspecified site: Secondary | ICD-10-CM | POA: Insufficient documentation

## 2016-05-22 DIAGNOSIS — M150 Primary generalized (osteo)arthritis: Secondary | ICD-10-CM

## 2016-05-22 DIAGNOSIS — M1A00X0 Idiopathic chronic gout, unspecified site, without tophus (tophi): Secondary | ICD-10-CM | POA: Diagnosis not present

## 2016-05-22 DIAGNOSIS — I10 Essential (primary) hypertension: Secondary | ICD-10-CM | POA: Diagnosis not present

## 2016-05-22 DIAGNOSIS — M109 Gout, unspecified: Secondary | ICD-10-CM | POA: Insufficient documentation

## 2016-05-22 DIAGNOSIS — Z789 Other specified health status: Secondary | ICD-10-CM

## 2016-05-22 DIAGNOSIS — Z7289 Other problems related to lifestyle: Secondary | ICD-10-CM | POA: Insufficient documentation

## 2016-05-22 MED ORDER — HYDROCHLOROTHIAZIDE 12.5 MG PO CAPS: 12.5000 mg | ORAL_CAPSULE | Freq: Every day | ORAL | 1 refills | Status: DC

## 2016-05-22 MED ORDER — ALLOPURINOL 300 MG PO TABS: 300.0000 mg | ORAL_TABLET | Freq: Every day | ORAL | 1 refills | Status: DC

## 2016-05-22 MED ORDER — METOPROLOL SUCCINATE ER 50 MG PO TB24: 50.0000 mg | ORAL_TABLET | Freq: Every day | ORAL | 1 refills | Status: DC

## 2016-05-22 NOTE — Patient Instructions (Addendum)
Decrease nexium to 20mg  once daily. We will reassess in April.  Let's stop calcium.  meds refilled today. Nice to meet you today.  Return as needed or in April for medicare wellness visit, labs prior.

## 2016-05-22 NOTE — Assessment & Plan Note (Signed)
Chronic, stable. Suggested trial lower dose 20mg  nexium.

## 2016-05-22 NOTE — Assessment & Plan Note (Signed)
Chronic, stable. Continue current regimen. 

## 2016-05-22 NOTE — Progress Notes (Signed)
BP 126/72   Pulse 64   Temp 97.9 F (36.6 C) (Oral)   Ht 5\' 9"  (1.753 m)   Wt 230 lb (104.3 kg)   BMI 33.97 kg/m    CC: new pt to establish, med refill visit  Subjective:    Patient ID: [name], male    DOB: Jul 19, 1931, 83 y.o.   MRN: [id number]  HPI: [name] is a 83 y.o. male presenting on 05/22/2016 for Medication Refill   Has new pt appt 08/2016. Here for refill of meds. Prior saw Dr Glo Herring at [facility] in [place]. Records requested today. Step son is [name].   Gout - controlled on allopurinol.   GERD - controlled with nexium 40mg  daily. H/o vocal cord nodule removal ~2009. Started on nexium for this. Has tried several different PPIs including prilosec. Asks about decreasing dose.   HTN - compliant with hctz 12.5mg  daily, toprol XL 50mg  daily. No HA, vision changes, CP/tightness, SOB, leg swelling.   History of anal fistula s/p surgery. Takes miralax daily to stay regular.  H/o throat surgery for vocal cord nodule removal.   Relevant past medical, surgical, family and social history reviewed and updated as indicated. Interim medical history since our last visit reviewed. Allergies and medications reviewed and updated. No current outpatient prescriptions on file prior to visit.   No current facility-administered medications on file prior to visit.     Review of Systems Per HPI unless specifically indicated in ROS section     Objective:    BP 126/72   Pulse 64   Temp 97.9 F (36.6 C) (Oral)   Ht 5\' 9"  (1.753 m)   Wt 230 lb (104.3 kg)   BMI 33.97 kg/m   Wt Readings from Last 3 Encounters:  05/22/16 230 lb (104.3 kg)    Physical Exam  Constitutional: He appears well-developed and well-nourished. No distress.  HENT:  Head: Normocephalic and atraumatic.  Mouth/Throat: Oropharynx is clear and moist. No oropharyngeal exudate.  Eyes: Conjunctivae and EOM are normal. Pupils are equal, round, and reactive to light. No scleral  icterus.  Neck: Normal range of motion. Neck supple. Carotid bruit is not present. No thyromegaly present.  Cardiovascular: Normal rate, regular rhythm, normal heart sounds and intact distal pulses.   No murmur heard. Pulmonary/Chest: Breath sounds normal. No respiratory distress. He has no wheezes. He has no rales.  Musculoskeletal: He exhibits no edema.  Lymphadenopathy:    He has no cervical adenopathy.  Skin: Skin is warm and dry. No rash noted.  Psychiatric: He has a normal mood and affect.  Vitals reviewed.  No results found for this or any previous visit.    Assessment & Plan:   Problem List Items Addressed This Visit    Alcohol use    Encouraged moderation. Check LFTs next visit      GERD (gastroesophageal reflux disease)    Chronic, stable. Suggested trial lower dose 20mg  nexium.       Relevant Medications   esomeprazole (NEXIUM) 40 MG capsule   polyethylene glycol (MIRALAX / GLYCOLAX) packet   Lactobacillus (ACIDOPHILUS PO)   Gout    Chronic, stable. Continue current regimen.       History of osteomyelitis   HTN (hypertension) - Primary    Chronic, stable. Continue current regimen.       Relevant Medications   aspirin EC 81 MG tablet   hydrochlorothiazide (MICROZIDE) 12.5 MG capsule   metoprolol succinate (TOPROL-XL) 50 MG 24  hr tablet   Osteoarthritis    Stable on glucosamine.       Relevant Medications   aspirin EC 81 MG tablet   allopurinol (ZYLOPRIM) 300 MG tablet       Follow up plan: Return in about 3 months (around 08/20/2016) for medicare wellness visit.  Paul Bush, MD

## 2016-05-22 NOTE — Assessment & Plan Note (Addendum)
Chronic, stable. Continue current regimen. 

## 2016-05-22 NOTE — Progress Notes (Signed)
Pre visit review using our clinic review tool, if applicable. No additional management support is needed unless otherwise documented below in the visit note. 

## 2016-05-22 NOTE — Assessment & Plan Note (Signed)
Encouraged moderation. Check LFTs next visit

## 2016-05-22 NOTE — Assessment & Plan Note (Signed)
Stable on glucosamine.

## 2016-08-13 DIAGNOSIS — J019 Acute sinusitis, unspecified: Secondary | ICD-10-CM | POA: Diagnosis not present

## 2016-08-22 ENCOUNTER — Other Ambulatory Visit: Payer: Self-pay | Admitting: Family Medicine

## 2016-08-22 DIAGNOSIS — M1A00X0 Idiopathic chronic gout, unspecified site, without tophus (tophi): Secondary | ICD-10-CM

## 2016-08-22 DIAGNOSIS — I10 Essential (primary) hypertension: Secondary | ICD-10-CM

## 2016-08-23 ENCOUNTER — Ambulatory Visit (INDEPENDENT_AMBULATORY_CARE_PROVIDER_SITE_OTHER): Payer: Medicare Other

## 2016-08-23 ENCOUNTER — Encounter (INDEPENDENT_AMBULATORY_CARE_PROVIDER_SITE_OTHER): Payer: Self-pay

## 2016-08-23 VITALS — BP 130/74 | HR 56 | Temp 97.6°F | Ht 68.0 in | Wt 231.5 lb

## 2016-08-23 DIAGNOSIS — Z0000 Encounter for general adult medical examination without abnormal findings: Secondary | ICD-10-CM

## 2016-08-23 DIAGNOSIS — M1A00X0 Idiopathic chronic gout, unspecified site, without tophus (tophi): Secondary | ICD-10-CM | POA: Diagnosis not present

## 2016-08-23 DIAGNOSIS — I10 Essential (primary) hypertension: Secondary | ICD-10-CM | POA: Diagnosis not present

## 2016-08-23 LAB — COMPREHENSIVE METABOLIC PANEL
ALBUMIN: 4.1 g/dL (ref 3.5–5.2)
ALK PHOS: 54 U/L (ref 39–117)
ALT: 59 U/L — AB (ref 0–53)
AST: 51 U/L — AB (ref 0–37)
BILIRUBIN TOTAL: 0.6 mg/dL (ref 0.2–1.2)
BUN: 22 mg/dL (ref 6–23)
CALCIUM: 9.3 mg/dL (ref 8.4–10.5)
CO2: 29 mEq/L (ref 19–32)
CREATININE: 1.04 mg/dL (ref 0.40–1.50)
Chloride: 105 mEq/L (ref 96–112)
GFR: 72.55 mL/min (ref >60.00)
Glucose, Bld: 109 mg/dL — ABNORMAL HIGH (ref 70–99)
Potassium: 4.3 mEq/L (ref 3.5–5.1)
SODIUM: 139 meq/L (ref 135–145)
TOTAL PROTEIN: 7 g/dL (ref 6.0–8.3)

## 2016-08-23 LAB — LIPID PANEL
CHOLESTEROL: 139 mg/dL (ref 0–200)
HDL: 38.6 mg/dL — ABNORMAL LOW (ref >39.00)
LDL Cholesterol: 77 mg/dL (ref 0–99)
NonHDL: 100.42
TRIGLYCERIDES: 117 mg/dL (ref 0.0–149.0)
Total CHOL/HDL Ratio: 4
VLDL: 23.4 mg/dL (ref 0.0–40.0)

## 2016-08-23 LAB — MICROALBUMIN / CREATININE URINE RATIO
Creatinine,U: 195.9 mg/dL
MICROALB UR: 7.7 mg/dL — AB (ref 0.0–1.9)
Microalb Creat Ratio: 3.9 mg/g (ref 0.0–30.0)

## 2016-08-23 LAB — URIC ACID: URIC ACID, SERUM: 4.4 mg/dL (ref 4.0–7.8)

## 2016-08-23 NOTE — Patient Instructions (Signed)
[name] , Thank you for taking time to come for your Medicare Wellness Visit. I appreciate your ongoing commitment to your health goals. Please review the following plan we discussed and let me know if I can assist you in the future.   These are the goals we discussed: Goals    . Increase physical activity          When weather permits, I will resume walking at least 1 mile in morning.        This is a list of the screening recommended for you and due dates:  Health Maintenance  Topic Date Due  . Flu Shot  12/06/2016  . DTaP/Tdap/Td vaccine (2 - Td) 09/11/2022  . Tetanus Vaccine  09/11/2022  . Pneumonia vaccines  Completed   Preventive Care for Adults  A healthy lifestyle and preventive care can promote health and wellness. Preventive health guidelines for adults include the following key practices.  . A routine yearly physical is a good way to check with your health care provider about your health and preventive screening. It is a chance to share any concerns and updates on your health and to receive a thorough exam.  . Visit your dentist for a routine exam and preventive care every 6 months. Brush your teeth twice a day and floss once a day. Good oral hygiene prevents tooth decay and gum disease.  . The frequency of eye exams is based on your age, health, family medical history, use  of contact lenses, and other factors. Follow your health care provider's ecommendations for frequency of eye exams.  . Eat a healthy diet. Foods like vegetables, fruits, whole grains, low-fat dairy products, and lean protein foods contain the nutrients you need without too many calories. Decrease your intake of foods high in solid fats, added sugars, and salt. Eat the right amount of calories for you. Get information about a proper diet from your health care provider, if necessary.  . Regular physical exercise is one of the most important things you can do for your health. Most adults should get at  least 150 minutes of moderate-intensity exercise (any activity that increases your heart rate and causes you to sweat) each week. In addition, most adults need muscle-strengthening exercises on 2 or more days a week.  Silver Sneakers may be a benefit available to you. To determine eligibility, you may visit the website: www.silversneakers.com or contact program at 4017775604 Mon-Fri between 8AM-8PM.   . Maintain a healthy weight. The body mass index (BMI) is a screening tool to identify possible weight problems. It provides an estimate of body fat based on height and weight. Your health care provider can find your BMI and can help you achieve or maintain a healthy weight.   For adults 20 years and older: ? A BMI below 18.5 is considered underweight. ? A BMI of 18.5 to 24.9 is normal. ? A BMI of 25 to 29.9 is considered overweight. ? A BMI of 30 and above is considered obese.   . Maintain normal blood lipids and cholesterol levels by exercising and minimizing your intake of saturated fat. Eat a balanced diet with plenty of fruit and vegetables. Blood tests for lipids and cholesterol should begin at age 57 and be repeated every 5 years. If your lipid or cholesterol levels are high, you are over 50, or you are at high risk for heart disease, you may need your cholesterol levels checked more frequently. Ongoing high lipid and cholesterol levels should  be treated with medicines if diet and exercise are not working.  . If you smoke, find out from your health care provider how to quit. If you do not use tobacco, please do not start.  . If you choose to drink alcohol, please do not consume more than 2 drinks per day. One drink is considered to be 12 ounces (355 mL) of beer, 5 ounces (148 mL) of wine, or 1.5 ounces (44 mL) of liquor.  . If you are 28-68 years old, ask your health care provider if you should take aspirin to prevent strokes.  . Use sunscreen. Apply sunscreen liberally and repeatedly  throughout the day. You should seek shade when your shadow is shorter than you. Protect yourself by wearing long sleeves, pants, a wide-brimmed hat, and sunglasses year round, whenever you are outdoors.  . Once a month, do a whole body skin exam, using a mirror to look at the skin on your back. Tell your health care provider of new moles, moles that have irregular borders, moles that are larger than a pencil eraser, or moles that have changed in shape or color.

## 2016-08-23 NOTE — Progress Notes (Signed)
PCP notes:   Health maintenance:  No gaps identified.   Abnormal screenings:   None  Patient concerns:   Pt wants to discuss an audiology referral and a neurology referral for a sleep study. Pt states he needs a new CPAP machine.  Pt also has concerns about the wounds on both his big toes. Pt states they have been slow to heal.   Nurse concerns:  None  Next PCP appt:   08/31/16 @ 1515

## 2016-08-23 NOTE — Progress Notes (Signed)
Pre visit review using our clinic review tool, if applicable. No additional management support is needed unless otherwise documented below in the visit note. 

## 2016-08-23 NOTE — Progress Notes (Signed)
Subjective:   [name] is a 83 y.o. male who presents for an Initial Medicare Annual Wellness Visit.  Review of Systems  N/A Cardiac Risk Factors include: advanced age (>34men, >45 women);male gender;obesity (BMI >30kg/m2);hypertension    Objective:    Today's Vitals   08/23/16 1038  BP: 130/74  Pulse: (!) 56  Temp: 97.6 F (36.4 C)  TempSrc: Oral  SpO2: 95%  Weight: 231 lb 8 oz (105 kg)  Height: 5\' 8"  (1.727 m)  PainSc: 0-No pain   Body mass index is 35.2 kg/m.  Current Medications (verified) Outpatient Encounter Prescriptions as of 08/23/2016  Medication Sig  . allopurinol (ZYLOPRIM) 300 MG tablet Take 1 tablet (300 mg total) by mouth daily.  Marland Kitchen aspirin EC 81 MG tablet Take 81 mg by mouth daily.  Marland Kitchen esomeprazole (NEXIUM) 40 MG capsule Take 20 mg by mouth daily.   Marland Kitchen GLUCOSAMINE-CHONDROITIN-MSM PO Take 1 tablet by mouth daily.  . hydrochlorothiazide (MICROZIDE) 12.5 MG capsule Take 1 capsule (12.5 mg total) by mouth daily.  . Lactobacillus (ACIDOPHILUS PO) Take 1 capsule by mouth as needed.  . metoprolol succinate (TOPROL-XL) 50 MG 24 hr tablet Take 1 tablet (50 mg total) by mouth daily. Take with or immediately following a meal.  . Milk Thistle 1000 MG CAPS Take 1 capsule by mouth daily.  . Multiple Vitamins-Minerals (MULTIVITAMIN ADULT PO) Take 1 tablet by mouth daily.  . polyethylene glycol (MIRALAX / GLYCOLAX) packet Take 17 g by mouth daily.   No facility-administered encounter medications on file as of 08/23/2016.     Allergies (verified) Lisinopril   History: Past Medical History:  Diagnosis Date  . Diverticulitis   . GERD (gastroesophageal reflux disease)   . Gout   . History of osteomyelitis 10/2015   left foot  . HTN (hypertension)   . Osteoarthritis    back, neck, knees - multiple joints  . Personal history of malignant neoplasm of larynx 2009  . Seasonal allergies    Past Surgical History:  Procedure Laterality Date  . ACHILLES TENDON  REPAIR    . APPENDECTOMY    . THROAT SURGERY N/A 2009   ?vocal cord nodule removal  . TONSILLECTOMY    . TREATMENT FISTULA ANAL     Family History  Problem Relation Age of Onset  . Hypertension Mother   . Hypertension Father   . Cancer Other     unknown - niece   Social History   Occupational History  . Not on file.   Social History Main Topics  . Smoking status: Never Smoker  . Smokeless tobacco: Never Used  . Alcohol use Yes     Comment: 3 glasses wine/day, was heavy drinker  . Drug use: No  . Sexual activity: Not on file   Tobacco Counseling Counseling given: No   Activities of Daily Living In your present state of health, do you have any difficulty performing the following activities: 08/23/2016  Hearing? Y  Vision? N  Difficulty concentrating or making decisions? N  Walking or climbing stairs? N  Dressing or bathing? N  Doing errands, shopping? N  Preparing Food and eating ? N  Using the Toilet? N  In the past six months, have you accidently leaked urine? N  Do you have problems with loss of bowel control? N  Managing your Medications? N  Managing your Finances? N  Housekeeping or managing your Housekeeping? N    Immunizations and Health Maintenance Immunization History  Administered Date(s) Administered  .  Hepatitis A 12/06/2012  . Influenza-Unspecified 01/19/2016  . Pneumococcal Conjugate-13 07/21/2014  . Pneumococcal Polysaccharide-23 09/09/2010  . Tdap 09/10/2012  . Zoster 02/06/2008   There are no preventive care reminders to display for this patient.  Patient Care Team: Ria Bush, MD as PCP - General (Family Medicine)    Assessment:   This is a routine wellness examination for [name].  Hearing/Vision screen Hearing Screening Comments: Bilateral hearing aids Vision Screening Comments: Last vision exam in December 2017  Dietary issues and exercise activities discussed: Current Exercise Habits: The patient does not participate in  regular exercise at present, Exercise limited by: None identified  Goals    . Increase physical activity          When weather permits, I will resume walking at least 1 mile in morning.       Depression Screen PHQ 2/9 Scores 08/23/2016  PHQ - 2 Score 0    Fall Risk Fall Risk  08/23/2016  Falls in the past year? No    Cognitive Function: MMSE - Mini Mental State Exam 08/23/2016  Orientation to time 5  Orientation to Place 5  Registration 3  Attention/ Calculation 0  Recall 3  Language- name 2 objects 0  Language- repeat 1  Language- follow 3 step command 3  Language- read & follow direction 0  Write a sentence 0  Copy design 0  Total score 20     PLEASE NOTE: A Mini-Cog screen was completed. Maximum score is 20. A value of 0 denotes this part of Folstein MMSE was not completed or the patient failed this part of the Mini-Cog screening.   Mini-Cog Screening Orientation to Time - Max 5 pts Orientation to Place - Max 5 pts Registration - Max 3 pts Recall - Max 3 pts Language Repeat - Max 1 pts Language Follow 3 Step Command - Max 3 pts     Screening Tests Health Maintenance  Topic Date Due  . INFLUENZA VACCINE  12/06/2016  . DTaP/Tdap/Td (2 - Td) 09/11/2022  . TETANUS/TDAP  09/11/2022  . PNA vac Low Risk Adult  Completed        Plan:     I have personally reviewed and addressed the Medicare Annual Wellness questionnaire and have noted the following in the patient's chart:  A. Medical and social history B. Use of alcohol, tobacco or illicit drugs  C. Current medications and supplements D. Functional ability and status E.  Nutritional status F.  Physical activity G. Advance directives H. List of other physicians I.  Hospitalizations, surgeries, and ER visits in previous 12 months J.  [place] to include hearing, vision, cognitive, depression L. Referrals and appointments - none  In addition, I have reviewed and discussed with patient certain  preventive protocols, quality metrics, and best practice recommendations. A written personalized care plan for preventive services as well as general preventive health recommendations were provided to patient.  See attached scanned questionnaire for additional information.   Signed,   Lindell Noe, MHA, BS, LPN Health Coach

## 2016-08-27 NOTE — Progress Notes (Signed)
I reviewed health advisor's note, was available for consultation, and agree with documentation and plan.  

## 2016-08-31 ENCOUNTER — Ambulatory Visit (INDEPENDENT_AMBULATORY_CARE_PROVIDER_SITE_OTHER): Payer: Medicare Other | Admitting: Family Medicine

## 2016-08-31 ENCOUNTER — Encounter: Payer: Self-pay | Admitting: Family Medicine

## 2016-08-31 VITALS — BP 136/82 | HR 72 | Temp 97.8°F | Wt 231.0 lb

## 2016-08-31 DIAGNOSIS — Z8739 Personal history of other diseases of the musculoskeletal system and connective tissue: Secondary | ICD-10-CM | POA: Diagnosis not present

## 2016-08-31 DIAGNOSIS — R740 Nonspecific elevation of levels of transaminase and lactic acid dehydrogenase [LDH]: Secondary | ICD-10-CM

## 2016-08-31 DIAGNOSIS — R7401 Elevation of levels of liver transaminase levels: Secondary | ICD-10-CM

## 2016-08-31 DIAGNOSIS — H903 Sensorineural hearing loss, bilateral: Secondary | ICD-10-CM | POA: Diagnosis not present

## 2016-08-31 DIAGNOSIS — S91109S Unspecified open wound of unspecified toe(s) without damage to nail, sequela: Secondary | ICD-10-CM

## 2016-08-31 DIAGNOSIS — G4733 Obstructive sleep apnea (adult) (pediatric): Secondary | ICD-10-CM

## 2016-08-31 DIAGNOSIS — Z1283 Encounter for screening for malignant neoplasm of skin: Secondary | ICD-10-CM | POA: Diagnosis not present

## 2016-08-31 DIAGNOSIS — I10 Essential (primary) hypertension: Secondary | ICD-10-CM

## 2016-08-31 DIAGNOSIS — Z8521 Personal history of malignant neoplasm of larynx: Secondary | ICD-10-CM

## 2016-08-31 DIAGNOSIS — R0989 Other specified symptoms and signs involving the circulatory and respiratory systems: Secondary | ICD-10-CM | POA: Diagnosis not present

## 2016-08-31 DIAGNOSIS — M1A00X0 Idiopathic chronic gout, unspecified site, without tophus (tophi): Secondary | ICD-10-CM

## 2016-08-31 DIAGNOSIS — Z9989 Dependence on other enabling machines and devices: Secondary | ICD-10-CM | POA: Diagnosis not present

## 2016-08-31 MED ORDER — HYDROCHLOROTHIAZIDE 12.5 MG PO CAPS: 12.5000 mg | ORAL_CAPSULE | Freq: Every day | ORAL | 1 refills | Status: DC

## 2016-08-31 MED ORDER — METOPROLOL SUCCINATE ER 50 MG PO TB24: 50.0000 mg | ORAL_TABLET | Freq: Every day | ORAL | 1 refills | Status: DC

## 2016-08-31 NOTE — Progress Notes (Signed)
Pre visit review using our clinic review tool, if applicable. No additional management support is needed unless otherwise documented below in the visit note. 

## 2016-08-31 NOTE — Patient Instructions (Addendum)
We will refer you to skin doctor, neurologist and hearing doctor.  We will order ultrasound to check neck arteries.  See Rosaria Ferries on your way out.  Return in 6 months for follow up visit.  Vaseline or moisturizing cream twice daily to toe.

## 2016-08-31 NOTE — Progress Notes (Signed)
BP 136/82   Pulse 72   Temp 97.8 F (36.6 C) (Oral)   Wt 231 lb (104.8 kg)   BMI 35.12 kg/m    CC: new pt to establish care (seen 05/2016 for med refill visit) Subjective:    Patient ID: [name], male    DOB: May 07, 1932, 83 y.o.   MRN: [id number]  HPI: [name] is a 83 y.o. male presenting on 08/31/2016 for Annual Exam   Moved recently from [place], [place]. Prior saw Dr Glo Herring - records requested.  Saw Lesia last week for medicare wellness visit. Note reviewed.    Requests audiology referral - wears hearing aides.  Requests neurology referral to establish for obstructive sleep apnea - needs new CPAP machine. Has had machine for 17 yrs. Sleeps well with CPAP nightly.   Would like referral to dermatologist Dr Nehemiah Massed at [facility] skin. h/o multiple AKs on face s/p treatment. h/o squamous cell cancer behind L ear.   4 months ago burned toes working in [employer]. Very slow improvement. h/o osteomyelitis to L great toe. h/o fungal infection of toenails, no improvement with OTC remedy.   Relevant past medical, surgical, family and social history reviewed and updated as indicated. Interim medical history since our last visit reviewed. Allergies and medications reviewed and updated. Outpatient Medications Prior to Visit  Medication Sig Dispense Refill  . allopurinol (ZYLOPRIM) 300 MG tablet Take 1 tablet (300 mg total) by mouth daily. 90 tablet 1  . aspirin EC 81 MG tablet Take 81 mg by mouth daily.    Marland Kitchen esomeprazole (NEXIUM) 40 MG capsule Take 20 mg by mouth daily.     Marland Kitchen GLUCOSAMINE-CHONDROITIN-MSM PO Take 1 tablet by mouth daily.    . Lactobacillus (ACIDOPHILUS PO) Take 1 capsule by mouth as needed.    . Milk Thistle 1000 MG CAPS Take 1 capsule by mouth daily.    . Multiple Vitamins-Minerals (MULTIVITAMIN ADULT PO) Take 1 tablet by mouth daily.    . polyethylene glycol (MIRALAX / GLYCOLAX) packet Take 17 g by mouth daily.    . hydrochlorothiazide (MICROZIDE) 12.5 MG  capsule Take 1 capsule (12.5 mg total) by mouth daily. 90 capsule 1  . metoprolol succinate (TOPROL-XL) 50 MG 24 hr tablet Take 1 tablet (50 mg total) by mouth daily. Take with or immediately following a meal. 90 tablet 1   No facility-administered medications prior to visit.      Per HPI unless specifically indicated in ROS section below Review of Systems     Objective:    BP 136/82   Pulse 72   Temp 97.8 F (36.6 C) (Oral)   Wt 231 lb (104.8 kg)   BMI 35.12 kg/m   Wt Readings from Last 3 Encounters:  08/31/16 231 lb (104.8 kg)  08/23/16 231 lb 8 oz (105 kg)  05/22/16 230 lb (104.3 kg)    Physical Exam  Constitutional: He appears well-developed and well-nourished. No distress.  HENT:  Head: Normocephalic and atraumatic.  Mouth/Throat: Oropharynx is clear and moist. No oropharyngeal exudate.  Neck: Normal range of motion. Neck supple. Carotid bruit is present (faint B). No thyromegaly present.  Cardiovascular: Normal rate, regular rhythm and intact distal pulses.   Murmur (2/6 SEM at RUSB) heard. Pulmonary/Chest: Effort normal and breath sounds normal. No respiratory distress. He has no wheezes. He has no rales.  Musculoskeletal: He exhibits no edema.  Thickened onychomycotic nails bilateral feet  Lymphadenopathy:    He has no cervical adenopathy.  Skin: Skin  is warm and dry. No rash noted.  Peeling dry skin R great toe Chronic wound L great toe with induration, without erythema, drainage, warmth or tenderness.  Psychiatric: He has a normal mood and affect.  Nursing note and vitals reviewed.  Results for orders placed or performed in visit on 08/23/16  Lipid panel  Result Value Ref Range   Cholesterol 139 0 - 200 mg/dL   Triglycerides 117.0 0.0 - 149.0 mg/dL   HDL 38.60 (L) >39.00 mg/dL   VLDL 23.4 0.0 - 40.0 mg/dL   LDL Cholesterol 77 0 - 99 mg/dL   Total CHOL/HDL Ratio 4    NonHDL 100.42   Comprehensive metabolic panel  Result Value Ref Range   Sodium 139 135  - 145 mEq/L   Potassium 4.3 3.5 - 5.1 mEq/L   Chloride 105 96 - 112 mEq/L   CO2 29 19 - 32 mEq/L   Glucose, Bld 109 (H) 70 - 99 mg/dL   BUN 22 6 - 23 mg/dL   Creatinine, Ser 1.04 0.40 - 1.50 mg/dL   Total Bilirubin 0.6 0.2 - 1.2 mg/dL   Alkaline Phosphatase 54 39 - 117 U/L   AST 51 (H) 0 - 37 U/L   ALT 59 (H) 0 - 53 U/L   Total Protein 7.0 6.0 - 8.3 g/dL   Albumin 4.1 3.5 - 5.2 g/dL   Calcium 9.3 8.4 - 10.5 mg/dL   GFR 72.55 >60.00 mL/min  Uric acid  Result Value Ref Range   Uric Acid, Serum 4.4 4.0 - 7.8 mg/dL  Microalbumin / creatinine urine ratio  Result Value Ref Range   Microalb, Ur 7.7 (H) 0.0 - 1.9 mg/dL   Creatinine,U 195.9 mg/dL   Microalb Creat Ratio 3.9 0.0 - 30.0 mg/g      Assessment & Plan:   Problem List Items Addressed This Visit    Gout    Chronic, stable on allopurinol      History of osteomyelitis    Residual dry deep scab at toe without signs of infection.      HTN (hypertension)    Chronic, stable. Continue current regimen of metoprolol and hydrochlorothiazide.       Relevant Medications   hydrochlorothiazide (MICROZIDE) 12.5 MG capsule   metoprolol succinate (TOPROL-XL) 50 MG 24 hr tablet   Open toe wound, sequela    Overall healed well, with residual chronic dried deep scab to L great toe. No signs of infection, no open skin at this time. Discussed supportive care with vaseline.       OSA on CPAP - Primary    h/o this - requests referral to establish care with neurology. Sleeps well with CPAP machine, compliant with use. Has not had sleep study in 15 yrs.       Relevant Orders   Ambulatory referral to Neurology   Personal history of malignant neoplasm of larynx   Sensorineural hearing loss (SNHL) of both ears    Latest audiology evaluation 01/2015. R>L hearing loss. Records reviewed. Had binaural amplification.       Relevant Orders   Ambulatory referral to Audiology   Severe obesity (BMI 35.0-39.9) with comorbidity ([place])    Transaminitis    Anticipate alcohol related. He continues to drink.  Encouraged cessation. He takes milk thistle for liver health.        Other Visit Diagnoses    Bilateral carotid bruits       Relevant Orders   VAS US CAROTID   Skin cancer screening  Relevant Orders   Ambulatory referral to Dermatology       Follow up plan: Return in about 6 months (around 03/02/2017) for follow up visit.  Ria Bush, MD

## 2016-09-01 ENCOUNTER — Encounter: Payer: Self-pay | Admitting: Family Medicine

## 2016-09-01 DIAGNOSIS — R740 Nonspecific elevation of levels of transaminase and lactic acid dehydrogenase [LDH]: Secondary | ICD-10-CM

## 2016-09-01 DIAGNOSIS — G4733 Obstructive sleep apnea (adult) (pediatric): Secondary | ICD-10-CM

## 2016-09-01 DIAGNOSIS — E669 Obesity, unspecified: Secondary | ICD-10-CM | POA: Insufficient documentation

## 2016-09-01 DIAGNOSIS — Z9989 Dependence on other enabling machines and devices: Principal | ICD-10-CM

## 2016-09-01 DIAGNOSIS — R7401 Elevation of levels of liver transaminase levels: Secondary | ICD-10-CM | POA: Insufficient documentation

## 2016-09-01 DIAGNOSIS — H903 Sensorineural hearing loss, bilateral: Secondary | ICD-10-CM

## 2016-09-01 HISTORY — DX: Obstructive sleep apnea (adult) (pediatric): G47.33

## 2016-09-01 HISTORY — DX: Sensorineural hearing loss, bilateral: H90.3

## 2016-09-01 NOTE — Assessment & Plan Note (Signed)
Residual dry deep scab at toe without signs of infection.

## 2016-09-01 NOTE — Assessment & Plan Note (Signed)
Anticipate alcohol related. He continues to drink.  Encouraged cessation. He takes milk thistle for liver health.

## 2016-09-01 NOTE — Assessment & Plan Note (Addendum)
h/o this - requests referral to establish care with neurology. Sleeps well with CPAP machine, compliant with use. Has not had sleep study in 15 yrs.

## 2016-09-01 NOTE — Assessment & Plan Note (Signed)
Chronic, stable on allopurinol

## 2016-09-01 NOTE — Assessment & Plan Note (Signed)
Latest audiology evaluation 01/2015. R>L hearing loss. Records reviewed. Had binaural amplification.

## 2016-09-01 NOTE — Assessment & Plan Note (Signed)
Overall healed well, with residual chronic dried deep scab to L great toe. No signs of infection, no open skin at this time. Discussed supportive care with vaseline.

## 2016-09-01 NOTE — Assessment & Plan Note (Signed)
Chronic, stable. Continue current regimen of metoprolol and hydrochlorothiazide.

## 2016-09-13 ENCOUNTER — Ambulatory Visit: Payer: Medicare Other

## 2016-09-13 DIAGNOSIS — I6523 Occlusion and stenosis of bilateral carotid arteries: Secondary | ICD-10-CM | POA: Diagnosis not present

## 2016-09-13 DIAGNOSIS — R0989 Other specified symptoms and signs involving the circulatory and respiratory systems: Secondary | ICD-10-CM

## 2016-09-14 LAB — VAS US CAROTID
LCCADSYS: 183 cm/s
LCCAPDIAS: 14 cm/s
LEFT ECA DIAS: 0 cm/s
LICADDIAS: -7 cm/s
LICADSYS: -57 cm/s
LICAPDIAS: -17 cm/s
LICAPSYS: -110 cm/s
Left CCA dist dias: 23 cm/s
Left CCA prox sys: 122 cm/s
RCCAPSYS: 90 cm/s
RIGHT ECA DIAS: 0 cm/s
RIGHT VERTEBRAL DIAS: -6 cm/s
Right CCA prox dias: 9 cm/s
Right cca dist sys: -63 cm/s

## 2016-09-16 ENCOUNTER — Encounter: Payer: Self-pay | Admitting: Family Medicine

## 2016-09-16 DIAGNOSIS — I6523 Occlusion and stenosis of bilateral carotid arteries: Secondary | ICD-10-CM

## 2016-09-16 HISTORY — DX: Occlusion and stenosis of bilateral carotid arteries: I65.23

## 2016-09-18 ENCOUNTER — Telehealth: Payer: Self-pay | Admitting: Family Medicine

## 2016-09-18 NOTE — Telephone Encounter (Signed)
Patient returned Shapale's call. °

## 2016-09-18 NOTE — Telephone Encounter (Signed)
Addressed through result notes  

## 2016-09-21 ENCOUNTER — Encounter: Payer: Self-pay | Admitting: Family Medicine

## 2016-09-21 DIAGNOSIS — L570 Actinic keratosis: Secondary | ICD-10-CM | POA: Diagnosis not present

## 2016-09-21 DIAGNOSIS — D1801 Hemangioma of skin and subcutaneous tissue: Secondary | ICD-10-CM | POA: Diagnosis not present

## 2016-09-21 DIAGNOSIS — L821 Other seborrheic keratosis: Secondary | ICD-10-CM | POA: Diagnosis not present

## 2016-09-21 DIAGNOSIS — D225 Melanocytic nevi of trunk: Secondary | ICD-10-CM | POA: Diagnosis not present

## 2016-09-21 DIAGNOSIS — Z85828 Personal history of other malignant neoplasm of skin: Secondary | ICD-10-CM | POA: Diagnosis not present

## 2016-10-10 ENCOUNTER — Encounter: Payer: Self-pay | Admitting: Neurology

## 2016-10-11 ENCOUNTER — Ambulatory Visit (INDEPENDENT_AMBULATORY_CARE_PROVIDER_SITE_OTHER): Payer: Medicare Other | Admitting: Neurology

## 2016-10-11 ENCOUNTER — Encounter: Payer: Self-pay | Admitting: Neurology

## 2016-10-11 VITALS — BP 158/71 | HR 55 | Ht 69.0 in | Wt 236.0 lb

## 2016-10-11 DIAGNOSIS — E669 Obesity, unspecified: Secondary | ICD-10-CM

## 2016-10-11 DIAGNOSIS — Z9989 Dependence on other enabling machines and devices: Secondary | ICD-10-CM | POA: Diagnosis not present

## 2016-10-11 DIAGNOSIS — G253 Myoclonus: Secondary | ICD-10-CM | POA: Diagnosis not present

## 2016-10-11 DIAGNOSIS — G4733 Obstructive sleep apnea (adult) (pediatric): Secondary | ICD-10-CM

## 2016-10-11 NOTE — Patient Instructions (Signed)
Based on your symptoms and your exam I believe you are still at risk for obstructive sleep apnea or OSA, and I think we should proceed with a sleep study to determine the degree of the OSA. If you have more than mild OSA, I would like for you to continue with CPAP, but based on your history, we will have to requalify you for the diagnosis and to be able to get a new CPAP for you.  Please remember, the risks and ramifications of moderate to severe obstructive sleep apnea or OSA are: Cardiovascular disease, including congestive heart failure, stroke, difficult to control hypertension, arrhythmias, and even type 2 diabetes has been linked to untreated OSA. Sleep apnea causes disruption of sleep and sleep deprivation in most cases, which, in turn, can cause recurrent headaches, problems with memory, mood, concentration, focus, and vigilance. Most people with untreated sleep apnea report excessive daytime sleepiness, which can affect their ability to drive. Please do not drive if you feel sleepy.   I will likely see you back after your sleep study to go over the test results and where to go from there. We will call you after your sleep study to advise about the results (most likely, you will hear from [place], my nurse) and to set up an appointment at the time, as necessary.    Our sleep lab administrative assistant, Arrie Aran will call you to schedule your sleep study. If you don't hear back from her by next week please feel free to call her at [phone]. This is her direct line and please leave a message with your phone number to call back if you get the voicemail box. She will call back as soon as possible.

## 2016-10-11 NOTE — Progress Notes (Signed)
Subjective:    Patient ID: Jennifer Holland is a 83 y.o. male.  HPI     Star Age, MD, PhD [facility] [address] [place], [place] [address]  Dear Dr. Danise Mina,  I saw your patient, Seanpatrick Maisano, upon your kind request in my neurologic clinic today for initial consultation of his sleep disorder, in particular reevaluation of his prior diagnosis of OSA. The patient is unaccompanied today. As you know, Mr. Preece is an 83 year old right-handed gentleman with an underlying medical history of gout, reflux disease, diverticulitis, bilateral carotid artery stenosis, osteoarthritis, hypertension, skin cancer, history of osteomyelitis, hearing loss, seasonal allergies and obesity, who was previously diagnosed with obstructive sleep apnea and placed on CPAP therapy years ago. Prior sleep study results are not available for my review today. He has an older CPAP machine, maybe 81 years old. A CPAP compliance download is reviewed today, from 09/11/2016 through 10/10/2016, which is a total of 30 days, pressure of 13.8, AHI of 2.1. This past month, leak low with the 95th percentile at 4.5 L/m. I reviewed your office note from 08/31/2016. His Epworth sleepiness score is 7 out of 24, fatigue score is 17 out of 63. He was diagnosed with OSA when he turned 83, but has had Sx of OSA way before then. Per patient, he had an AHI of about 40/hour. He moved from [place] in 12/17, as wife's family is here in [place], [place]. He has 2 step children, no biological kids. He retired from Economist job.  He has one dog. TV tends to stay on at night, but does not bother him. No telltale RLS symptoms, but was told he jerked a lot during sleep study.  His bedtime varies, usually between 8:30 and 11. His wake up time very is, usually between 5:30 AM to 7 AM. He does not have night to night nocturia. He has one sister who had sleep apnea. He has a total of 6 siblings. Lives with his wife.  He is a nonsmoker, drinks alcohol daily about 2 glasses per evening, sometimes more on the weekends. He reports that he was a heavy drinker in the past, typically drinking liquor regularly. He does not drink much in the way of caffeine, usually one cup of coffee per day. He is fully compliant with CPAP machine. He would like to be able to get a new and updated machine.   His Past Medical History Is Significant For: Past Medical History:  Diagnosis Date  . Carotid stenosis, asymptomatic, bilateral 09/16/2016   Diffuse 1-39% bilaterally, but involving ICA, ECA, CCA - rpt 1 yr (09/2016)  . Diverticulitis   . GERD (gastroesophageal reflux disease)   . Gout   . History of osteomyelitis 10/2015   left foot  . History of squamous cell carcinoma of skin   . HTN (hypertension)   . OSA on CPAP 09/01/2016  . Osteoarthritis    back, neck, knees - multiple joints  . Personal history of malignant neoplasm of larynx 2009  . Seasonal allergies   . Sensorineural hearing loss (SNHL) of both ears 09/01/2016    His Past Surgical History Is Significant For: Past Surgical History:  Procedure Laterality Date  . ACHILLES TENDON REPAIR    . APPENDECTOMY    . THROAT SURGERY N/A 2009   ?vocal cord nodule removal  . TONSILLECTOMY    . TREATMENT FISTULA ANAL      His Family History Is Significant For: Family History  Problem Relation Age of Onset  . Hypertension Mother   . Hypertension Father   . Cancer Other        unknown - niece    His Social History Is Significant For: Social History   Social History  . Marital status: Married    Spouse name: N/A  . Number of children: N/A  . Years of education: N/A   Social History Main Topics  . Smoking status: Never Smoker  . Smokeless tobacco: Never Used  . Alcohol use Yes     Comment: 3 glasses wine/day, was heavy drinker  . Drug use: No  . Sexual activity: Not Asked   Other Topics Concern  . None   Social History Narrative   Widower. 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 Lisinopril Cough  :   His Current Medications Are:  Outpatient Encounter Prescriptions as of 10/11/2016  Medication Sig  . allopurinol (ZYLOPRIM) 300 MG tablet Take 1 tablet (300 mg total) by mouth daily.  Marland Kitchen aspirin EC 81 MG tablet Take 81 mg by mouth daily.  Marland Kitchen esomeprazole (NEXIUM) 40 MG capsule Take 20 mg by mouth daily.   Marland Kitchen GLUCOSAMINE-CHONDROITIN-MSM PO Take 1 tablet by mouth daily.  . hydrochlorothiazide (MICROZIDE) 12.5 MG capsule Take 1 capsule (12.5 mg total) by mouth daily.  . Lactobacillus (ACIDOPHILUS PO) Take 1 capsule by mouth as needed.  . metoprolol succinate (TOPROL-XL) 50 MG 24 hr tablet Take 1 tablet (50 mg total) by mouth daily. Take with or immediately following a meal.  . Milk Thistle 1000 MG CAPS Take 1 capsule by mouth daily.  . Multiple Vitamins-Minerals (MULTIVITAMIN ADULT PO) Take 1 tablet by mouth daily.  . polyethylene glycol (MIRALAX / GLYCOLAX) packet Take 17 g by mouth daily.   No facility-administered encounter medications on file as of 10/11/2016.   :  Review of Systems:  Out of a complete 14 point review of systems, all are reviewed and negative with the exception of these symptoms as listed below: Review of Systems  Neurological:       Pt presents today to discuss getting a new cpap. He had a sleep study when he was 83 and does not have a copy. His current cpap is about 81 years old. Pt moved here form [place] and needs a local DME.  Epworth Sleepiness Scale 0= would never doze 1= slight chance of dozing 2= moderate chance of dozing 3= high chance of dozing  Sitting and reading: 1 Watching TV: 1 Sitting inactive in a public place (ex. Theater or meeting): 0 As a passenger in a car for an hour without a break: 0 Lying down to rest in the afternoon: 2-3 Sitting and talking to  someone: 0 Sitting quietly after lunch (no alcohol): 1-2 In a car, while stopped in traffic:0 Total: 6-7     Objective:  Neurologic Exam  Physical Exam Physical Examination:   Vitals:   10/11/16 1119  BP: (!) 158/71  Pulse: (!) 55    General Examination: The patient is a very pleasant 83 y.o. male in no acute distress. He appears well-developed and well-nourished and well groomed.   HEENT: Normocephalic, atraumatic, pupils are equal, round and reactive to light and accommodation. Funduscopic exam is normal with sharp disc margins noted. Extraocular tracking is good without limitation to gaze excursion or nystagmus  noted. Normal smooth pursuit is noted. Hearing is mildly grossly intact. Hearing aids in place bilaterally. Face is symmetric with normal facial animation and normal facial sensation. Speech is clear with no dysarthria noted. There is no hypophonia. There is no lip, neck/head, jaw or voice tremor. Neck is supple with full range of passive and active motion. There are no carotid bruits on auscultation. Oropharynx exam reveals: mild mouth dryness, adequate dental hygiene and moderate airway crowding, due to redundant soft palate and smaller airway. Mallampati is class III. Tongue protrudes centrally and palate elevates symmetrically. Tonsils are absent. Neck size is 17 7/8 inches. He has a Mild overbite.   Chest: Clear to auscultation without wheezing, rhonchi or crackles noted.  Heart: S1+S2+0, regular and normal without murmurs, rubs or gallops noted.   Abdomen: Soft, non-tender and non-distended with normal bowel sounds appreciated on auscultation.  Extremities: There is trace pitting edema in the distal lower extremities bilaterally. Pedal pulses are intact.  Skin: Warm and dry without trophic changes noted. There are no varicose veins.  Musculoskeletal: exam reveals no obvious joint deformities, tenderness or joint swelling or erythema.   Neurologically:  Mental  status: The patient is awake, alert and oriented in all 4 spheres. His immediate and remote memory, attention, language skills and fund of knowledge are appropriate. There is no evidence of aphasia, agnosia, apraxia or anomia. Speech is clear with normal prosody and enunciation. Thought process is linear. Mood is normal and affect is normal.  Cranial nerves II - XII are as described above under HEENT exam. In addition: shoulder shrug is normal with equal shoulder height noted. Motor exam: Normal bulk, strength and tone is noted. There is no drift, tremor or rebound. Romberg is negative. Reflexes are 2+ throughout. Fine motor skills and coordination: intact with normal finger taps, normal hand movements, normal rapid alternating patting, normal foot taps and normal foot agility.  Cerebellar testing: No dysmetria or intention tremor on finger to nose testing. Heel to shin is unremarkable bilaterally. There is no truncal or gait ataxia.  Sensory exam: intact to light touch in the upper and lower extremities.  Gait, station and balance: He stands easily. No veering to one side is noted. No leaning to one side is noted. Posture is age-appropriate and stance is narrow based. Gait shows normal stride length and normal pace. No problems turning are noted.   Assessment and Plan:   In summary, Kristina Bertone is a very pleasant 83 y.o.-year old male with an underlying medical history of gout, reflux disease, diverticulitis, bilateral carotid artery stenosis, osteoarthritis, hypertension, skin cancer, history of osteomyelitis, hearing loss, seasonal allergies and obesity, whose history and physical exam are in keeping with obstructive sleep apnea (OSA). He would benefit from reevaluation and also updating his CPAP machine and supplies.  I had a long chat with the patient about my findings and the diagnosis of OSA, its prognosis and treatment options. We talked about medical treatments, surgical interventions and  non-pharmacological approaches. I explained in particular the risks and ramifications of untreated moderate to severe OSA, especially with respect to developing cardiovascular disease down the Road, including congestive heart failure, difficult to treat hypertension, cardiac arrhythmias, or stroke. Even type 2 diabetes has, in part, been linked to untreated OSA. Symptoms of untreated OSA include daytime sleepiness, memory problems, mood irritability and mood disorder such as depression and anxiety, lack of energy, as well as recurrent headaches, especially morning headaches. We talked about trying to maintain a healthy lifestyle  in general, as well as the importance of weight control. I encouraged the patient to eat healthy, exercise daily and keep well hydrated, to keep a scheduled bedtime and wake time routine, to not skip any meals and eat healthy snacks in between meals. I advised the patient not to drive when feeling sleepy. I recommended the following at this time: sleep study with potential positive airway pressure titration. (We will score hypopneas at 4%).   I explained the sleep test procedure to the patient and also outlined possible surgical and non-surgical treatment options of OSA, including the use of a custom-made dental device (which would require a referral to a specialist dentist or oral surgeon), upper airway surgical options, such as pillar implants, radiofrequency surgery, tongue base surgery, and UPPP (which would involve a referral to an ENT surgeon). Rarely, jaw surgery such as mandibular advancement may be considered.  I also explained the CPAP treatment option to the patient, who indicated that he would be willing to continue to use CPAP. He is commended for his superb CPAP adherence.  I answered all his questions today and the patient was in agreement. I would like to see him back after the sleep study is completed and encouraged him to call with any interim questions, concerns,  problems or updates.   Thank you very much for allowing me to participate in the care of this nice patient. If I can be of any further assistance to you please do not hesitate to call me at [phone].  Sincerely,   Star Age, MD, PhD

## 2016-10-27 ENCOUNTER — Ambulatory Visit (INDEPENDENT_AMBULATORY_CARE_PROVIDER_SITE_OTHER): Payer: Medicare Other | Admitting: Neurology

## 2016-10-27 DIAGNOSIS — Z9989 Dependence on other enabling machines and devices: Secondary | ICD-10-CM

## 2016-10-27 DIAGNOSIS — G4720 Circadian rhythm sleep disorder, unspecified type: Secondary | ICD-10-CM

## 2016-10-27 DIAGNOSIS — G4733 Obstructive sleep apnea (adult) (pediatric): Secondary | ICD-10-CM | POA: Diagnosis not present

## 2016-10-27 DIAGNOSIS — G4761 Periodic limb movement disorder: Secondary | ICD-10-CM

## 2016-11-07 NOTE — Procedures (Signed)
PATIENT'S NAME:  [name], [name] DOB:      [birth year]      MR#:    [id number]     DATE OF RECORDING: 10/27/2016 REFERRING M.D.:  Ria Bush MD Study Performed:  Split-Night Titration Study HISTORY: 83 year old man with a history of gout, reflux disease, diverticulitis, bilateral carotid artery stenosis, osteoarthritis, hypertension, skin cancer, history of osteomyelitis, hearing loss, seasonal allergies and obesity, who was previously diagnosed with obstructive sleep apnea and placed on CPAP therapy years ago. He presents for re-evaluation to get an update machine and updated mask fit and pressure settings. The patient endorsed the Epworth Sleepiness Scale at 7 points. The patient's weight 236 pounds with a height of 69 (inches), resulting in a BMI of 34.9 kg/m2. The patient's neck circumference measured 17.5 inches.  CURRENT MEDICATIONS: Allopurinol, Aspirin, Nexium, Microzide, Toprol  PROCEDURE:  This is a multichannel digital polysomnogram utilizing the Somnostar 11.2 system.  Electrodes and sensors were applied and monitored per AASM Specifications.   EEG, EOG, Chin and Limb EMG, were sampled at 200 Hz.  ECG, Snore and Nasal Pressure, Thermal Airflow, Respiratory Effort, CPAP Flow and Pressure, Oximetry was sampled at 50 Hz. Digital video and audio were recorded.      BASELINE STUDY WITHOUT CPAP RESULTS:  Lights Out was at 20:56 and Lights On at 05:05 for the night, split study started at 00:53, epoch 484. Total recording time (TRT) was 237, with a total sleep time (TST) of 106 minutes. REM sleep was absent. The sleep efficiency was 44.7 %.    SLEEP ARCHITECTURE: WASO (Wake after sleep onset) was 122 minutes, with severe sleep fragmentation noted. Stage N1 was 101 minutes, Stage N2 was 5 minutes, Stage N3 was 0 minutes and Stage R (REM sleep) was 0 minutes.  The percentages were Stage N1 95.3%, Stage N2 4.7%, Stage N3 and Stage R (REM sleep) were absent.  The arousals were noted as: 33 were  spontaneous, 36 were associated with PLMs, 61 were associated with respiratory events.   Audio and video analysis did not show any abnormal or unusual movements, behaviors, phonations or vocalizations.  The patient took 3 bathroom breaks for the night. Mild to moderate snoring was noted.  The EKG was in keeping with normal sinus rhythm (NSR)  RESPIRATORY ANALYSIS:  There were a total of 45 respiratory events:  35 obstructive apneas, 0 central apneas and 0 mixed apneas with a total of 35 apneas and an apnea index (AI) of 19.8. There were 10 hypopneas with a hypopnea index of 5.7. The patient also had 19 respiratory event related arousals (RERAs).  Snoring was noted.     The total APNEA/HYPOPNEA INDEX (AHI) was 25.5 /hour and the total RESPIRATORY DISTURBANCE INDEX was 36.2 /hour.  0 events occurred in REM sleep and 20 events in NREM. The REM AHI was 0, /hour versus a non-REM AHI of 25.5 /hour. The patient spent 223.5 minutes sleep time in the supine position 87 minutes in non-supine. The supine AHI was 24.9 /hour versus a non-supine AHI of 28.9 /hour.  OXYGEN SATURATION & C02:  The wake baseline 02 saturation was 94%, with the lowest being 89%. Time spent below 89% saturation equaled 0 minutes.   PERIODIC LIMB MOVEMENTS: The patient had a total of 84 Periodic Limb Movements.  The Periodic Limb Movement (PLM) index was 47.5 /hour and the PLM Arousal index was 20.4 /hour.  TITRATION STUDY WITH CPAP RESULTS:   The patient was fitted with a medium N20 nasal  mask. CPAP was initiated at 5 cmH20 with heated humidity per AASM split night standards and pressure was advanced to 10 cmH20 because of hypopneas, apneas and desaturations.  At a PAP pressure of 10 cmH20, there was a reduction of the AHI to 0 /hour with O2 nadir of 93% and minimal supine REM sleep achieved.   Total recording time (TRT) was 252.5 minutes, with a total sleep time (TST) of 204 minutes. The patient's sleep latency was 12.5 minutes. REM  latency was 21.5 minutes.  The sleep efficiency was 80.8 %.    SLEEP ARCHITECTURE: Wake after sleep was 43.5 minutes, with moderate sleep fragmentation noted. Stage N1 59.5 minutes, Stage N2 62.5 minutes, Stage N3 36 minutes and Stage R (REM sleep) 46 minutes. The percentages were: Stage N1 29.2%, Stage N2 30.6%, Stage N3 17.6% and Stage R (REM sleep) 22.5%. The sleep architecture was notable for evidence of slow wave sleep and REM sleep achieved. The arousals were noted as: 53 were spontaneous, 0 were associated with PLMs, 8 were associated with respiratory events.  RESPIRATORY ANALYSIS:  There were a total of 0 respiratory events: 0 obstructive apneas, 0 central apneas and 0 mixed apneas with a total of 0 apneas and an apnea index (AI) of 0. There were 0 hypopneas with a hypopnea index of 0 /hour. The patient also had 8 respiratory event related arousals (RERAs).      The total APNEA/HYPOPNEA INDEX  (AHI) was 0 /hour and the total RESPIRATORY DISTURBANCE INDEX was 2.4 /hour.  0 events occurred in REM sleep and 0 events in NREM. The REM AHI was 0 /hour versus a non-REM AHI of 0 /hour. REM sleep was achieved on a pressure of  cm/h2o (AHI was  .) The patient spent 65% of total sleep time in the supine position. The supine AHI was 0.0 /hour, versus a non-supine AHI of 0.0/hour.  OXYGEN SATURATION & C02:  The wake baseline 02 saturation was 95%, with the lowest being 89%. Time spent below 89% saturation equaled 0 minutes.  PERIODIC LIMB MOVEMENTS:  The patient had a total of 0 Periodic Limb Movements. The Periodic Limb Movement (PLM) index was 0 /hour and the PLM Arousal index was 0 /hour.   Post-study, the patient indicated that sleep was worse than usual.  POLYSOMNOGRAPHY IMPRESSION :   1. Obstructive Sleep Apnea(OSA)  2. Dysfunctions associated with sleep stages or arousals from sleep 3. Periodic Limb Movement Disorder (PLMD)  RECOMMENDATIONS:  1. This patient has moderate obstructive sleep  apnea and responded well on CPAP therapy. Of note, the absence of REM sleep during the baseline portion of the study likely renders an underestimation of his AHI and O2 nadir. I will prescribe a new CPAP machine, set at 10 cm via medium nasal mask with heated humidity. The patient should be reminded to be fully compliant with PAP therapy to improve sleep related symptoms and decrease long term cardiovascular risks. Please note that untreated obstructive sleep apnea carries additional perioperative morbidity. Patients with significant obstructive sleep apnea should receive perioperative PAP therapy and the surgeons and particularly the anesthesiologist should be informed of the diagnosis and the severity of the sleep disordered breathing. 2. This study shows significant sleep fragmentation and abnormal sleep stage percentages; these are nonspecific findings and per se do not signify an intrinsic sleep disorder or a cause for the patient's sleep-related symptoms. Causes include (but are not limited to) the first night effect of the sleep study, circadian rhythm disturbances, medication  effect or an underlying mood disorder or medical problem.  3. Moderate PLMs (periodic limb movements of sleep) were noted during the baseline portion of the study only with moderate arousals; clinical correlation is recommended. He had no significant PLMs during the titration portion of the study.  4. The patient should be cautioned not to drive, work at heights, or operate dangerous or heavy equipment when tired or sleepy. Review and reiteration of good sleep hygiene measures should be pursued with any patient. 5. The patient will be seen in follow-up by Dr. Rexene Alberts at [facility] for discussion of the test results and further management strategies. The referring provider will be notified of the test results.  I certify that I have reviewed the entire raw data recording prior to the issuance of this report in accordance with the Standards  of Accreditation of the American Academy of Sleep Medicine (AASM)   Star Age, MD, PhD Diplomat, American Board of Psychiatry and Neurology (Neurology and Sleep Medicine)

## 2016-11-07 NOTE — Addendum Note (Signed)
Addended by: Star Age on: 11/07/2016 06:53 PM   Modules accepted: Orders

## 2016-11-07 NOTE — Progress Notes (Signed)
  Patient referred by Dr. Danise Mina, seen by me on 10/11/16, split study on 10/27/16. Please call and notify patient that the recent sleep study confirmed the diagnosis of moderate OSA. He did well with CPAP during the study with significant improvement of the respiratory events. Therefore, I would like to prescribe a NEW CPAP machine for him. Has been on PAP therapy for many years. May have DME. I placed the order in the chart. The patient will need a follow up appointment with me or MM or CM in 10 weeks post set up that has to be scheduled; please go ahead and schedule while you have the patient on the phone and make sure patient understands the importance of keeping this window for the FU appointment, as it is often an insurance requirement and failing to adhere to this may result in losing coverage for sleep apnea treatment.  Please re-enforce the importance of compliance with treatment and the need for [place] to monitor compliance data - again an insurance requirement and good feedback for the patient as far as how they are doing.  Also remind patient, that any upcoming CPAP machine or mask issues, should be first addressed with the DME company. Please ask if patient has a preference regarding DME company.  Please arrange for CPAP set up at home through a DME company of patient's choice - once you have spoken to the patient - and faxed/routed report to PCP and referring MD (if other than PCP), you can close this encounter, thanks,   Star Age, MD, PhD [facility]

## 2016-11-09 ENCOUNTER — Telehealth: Payer: Self-pay

## 2016-11-09 NOTE — Telephone Encounter (Signed)
-----   Message from Star Age, MD sent at 11/07/2016  6:53 PM EDT -----  Patient referred by Dr. Danise Mina, seen by me on 10/11/16, split study on 10/27/16. Please call and notify patient that the recent sleep study confirmed the diagnosis of moderate OSA. He did well with CPAP during the study with significant improvement of the respiratory events. Therefore, I would like to prescribe a NEW CPAP machine for him. Has been on PAP therapy for many years. May have DME. I placed the order in the chart. The patient will need a follow up appointment with me or MM or CM in 10 weeks post set up that has to be scheduled; please go ahead and schedule while you have the patient on the phone and make sure patient understands the importance of keeping this window for the FU appointment, as it is often an insurance requirement and failing to adhere to this may result in losing coverage for sleep apnea treatment.  Please re-enforce the importance of compliance with treatment and the need for [place] to monitor compliance data - again an insurance requirement and good feedback for the patient as far as how they are doing.  Also remind patient, that any upcoming CPAP machine or mask issues, should be first addressed with the DME company. Please ask if patient has a preference regarding DME company.  Please arrange for CPAP set up at home through a DME company of patient's choice - once you have spoken to the patient - and faxed/routed report to PCP and referring MD (if other than PCP), you can close this encounter, thanks,   Star Age, MD, PhD [facility] ([place])

## 2016-11-09 NOTE — Telephone Encounter (Signed)
I called pt, spoke to pt's wife, Candis Schatz, per [facility]. I advised pt's wife that Dr. Rexene Alberts reviewed pt's sleep study results and found that pt had moderate osa. Dr. Rexene Alberts recommends that pt start a new cpap at home. I reviewed PAP compliance expectations with the pt. Pt's is agreeable to pt starting a new  CPAP. Pt's wife reports that pt already uses [facility]. I advised pt's wife that an order will be sent to a DME, [facility], and [facility] will call the pt within about one week after they file with the pt's insurance. [facility] will show the pt how to use the machine, fit for masks, and troubleshoot the CPAP if needed. [place] the pt will call me back today to make his follow up appt for September. Pt's wife verbalized understanding of results. Pt's wife had no questions at this time but was encouraged to call back if questions arise.

## 2016-11-09 NOTE — Telephone Encounter (Signed)
Pt returned my call, and appt was made for 01/23/17 at 3:00pm with Dr. Rexene Alberts. I confirmed that the address we have on file is correct, and I advised pt that I will mail him a letter with all of this information in it. Pt is agreeable to starting a new cpap, and using [facility], as discussed with the pt's wife.

## 2016-11-20 ENCOUNTER — Encounter: Payer: Self-pay | Admitting: Family Medicine

## 2016-11-20 ENCOUNTER — Ambulatory Visit (INDEPENDENT_AMBULATORY_CARE_PROVIDER_SITE_OTHER): Payer: Medicare Other | Admitting: Family Medicine

## 2016-11-20 VITALS — BP 138/60 | HR 58 | Temp 98.2°F | Ht 68.0 in | Wt 238.5 lb

## 2016-11-20 DIAGNOSIS — J0100 Acute maxillary sinusitis, unspecified: Secondary | ICD-10-CM

## 2016-11-20 MED ORDER — AMOXICILLIN-POT CLAVULANATE 875-125 MG PO TABS: 1.0000 | ORAL_TABLET | Freq: Two times a day (BID) | ORAL | 0 refills | Status: AC

## 2016-11-20 NOTE — Progress Notes (Signed)
Dr. Frederico Hamman T. Erling Arrazola, MD, [facility] [place], [address] Phone: [phone] Fax: [phone]  11/20/2016  Patient: [name], MRN: [id number], DOB: February 20, 1932, 83 y.o.  Primary Physician:  Ria Bush, MD   Chief Complaint  Patient presents with  . Sinusitis   Subjective:   This 83 y.o. male patient presents with runny nose, sneezing, cough, sore throat, malaise and minimal / low-grade fever for > 2 week. Now the primary complaint has become sinus pressure and pain behind the eyes and in the upper, anterior face.   The patent denies sore throat as the primary complaint. Denies sthortness of breath/wheezing, high fever, chest pain, significant myalgia, otalgia, abdominal pain, changes in bowel or bladder.  PMH, PHS, Allergies, Problem List, Medications, Family History, and Social History have all been reviewed.  Patient Active Problem List   Diagnosis Date Noted  . Carotid stenosis, asymptomatic, bilateral 09/16/2016  . Transaminitis 09/01/2016  . OSA on CPAP 09/01/2016  . Sensorineural hearing loss (SNHL) of both ears 09/01/2016  . Severe obesity (BMI 35.0-39.9) with comorbidity ([place]) 09/01/2016  . Open toe wound, sequela 08/31/2016  . Alcohol use 05/22/2016  . GERD (gastroesophageal reflux disease)   . Gout   . HTN (hypertension)   . Osteoarthritis   . History of osteomyelitis 10/07/2015  . Personal history of malignant neoplasm of larynx 05/09/2007    Past Medical History:  Diagnosis Date  . Carotid stenosis, asymptomatic, bilateral 09/16/2016   Diffuse 1-39% bilaterally, but involving ICA, ECA, CCA - rpt 1 yr (09/2016)  . Diverticulitis   . GERD (gastroesophageal reflux disease)   . Gout   . History of osteomyelitis 10/2015   left foot  . History of squamous cell carcinoma of skin   . HTN (hypertension)   . OSA on CPAP 09/01/2016  . Osteoarthritis    back, neck, knees - multiple joints    . Personal history of malignant neoplasm of larynx 2009  . Seasonal allergies   . Sensorineural hearing loss (SNHL) of both ears 09/01/2016    Past Surgical History:  Procedure Laterality Date  . ACHILLES TENDON REPAIR    . APPENDECTOMY    . THROAT SURGERY N/A 2009   ?vocal cord nodule removal  . TONSILLECTOMY    . TREATMENT FISTULA ANAL      Social History   Social History  . Marital status: Married    Spouse name: N/A  . Number of children: N/A  . Years of education: N/A   Occupational History  . Not on file.   Social History Main Topics  . Smoking status: Never Smoker  . Smokeless tobacco: Never Used  . Alcohol use Yes     Comment: 3 glasses wine/day, was heavy drinker  . Drug use: No  . Sexual activity: Not on file   Other Topics Concern  . Not on file   Social History Narrative   Widower. 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 Hypertension Mother   . Hypertension Father   . Cancer Other        unknown - niece    Allergies  Allergen Reactions  . Lisinopril Cough    Medication list reviewed and updated in full in [place].  ROS as above, eating and drinking - tolerating  PO. Urinating normally. No excessive vomitting or diarrhea. O/w as above.  Objective:   Blood pressure 138/60, pulse (!) 58, temperature 98.2 F (36.8 C), temperature source Oral, height 5\' 8"  (1.727 m), weight 238 lb 8 oz (108.2 kg), SpO2 95 %.  GEN: WDWN, Non-toxic, Atraumatic, normocephalic. A and O x 3. HEENT: Oropharynx clear without exudate, MMM, no significant LAD, mild rhinnorhea Sinuses: Right Frontal, ethmoid, and maxillary: Tender max Left Frontal, Ethmoid, and maxillary: Tender max Ears: TM clear, COL visualized with good landmarks CV: RRR, no m/g/r. Pulm: CTA B, no wheezes, rhonchi, or crackles, normal respiratory  effort. EXT: no c/c/e Psych: well oriented, neither depressed nor anxious in appearance  Assessment and Plan:   Acute non-recurrent maxillary sinusitis  Acute sinusitis: ABX as below.   Reviewed symptomatic care as well as ABX in this case.    Follow-up: No Follow-up on file.  New Prescriptions   AMOXICILLIN-CLAVULANATE (AUGMENTIN) 875-125 MG TABLET    Take 1 tablet by mouth 2 (two) times daily.   Signed,  Maud Deed. Ikesha Siller, MD  Patient's Medications  New Prescriptions   AMOXICILLIN-CLAVULANATE (AUGMENTIN) 875-125 MG TABLET    Take 1 tablet by mouth 2 (two) times daily.  Previous Medications   ALLOPURINOL (ZYLOPRIM) 300 MG TABLET    Take 1 tablet (300 mg total) by mouth daily.   ASPIRIN EC 81 MG TABLET    Take 81 mg by mouth daily.   ESOMEPRAZOLE (NEXIUM) 40 MG CAPSULE    Take 20 mg by mouth daily.    GLUCOSAMINE-CHONDROITIN-MSM PO    Take 1 tablet by mouth daily.   HYDROCHLOROTHIAZIDE (MICROZIDE) 12.5 MG CAPSULE    Take 1 capsule (12.5 mg total) by mouth daily.   LACTOBACILLUS (ACIDOPHILUS PO)    Take 1 capsule by mouth as needed.   METOPROLOL SUCCINATE (TOPROL-XL) 50 MG 24 HR TABLET    Take 1 tablet (50 mg total) by mouth daily. Take with or immediately following a meal.   MILK THISTLE 1000 MG CAPS    Take 1 capsule by mouth daily.   MULTIPLE VITAMINS-MINERALS (MULTIVITAMIN ADULT PO)    Take 1 tablet by mouth daily.   POLYETHYLENE GLYCOL (MIRALAX / GLYCOLAX) PACKET    Take 17 g by mouth daily.  Modified Medications   No medications on file  Discontinued Medications   No medications on file

## 2016-12-04 ENCOUNTER — Ambulatory Visit: Payer: Medicare Other | Admitting: Audiology

## 2016-12-07 ENCOUNTER — Ambulatory Visit: Payer: Medicare Other | Admitting: Audiology

## 2016-12-11 DIAGNOSIS — M5136 Other intervertebral disc degeneration, lumbar region: Secondary | ICD-10-CM | POA: Diagnosis not present

## 2016-12-11 DIAGNOSIS — M25551 Pain in right hip: Secondary | ICD-10-CM | POA: Diagnosis not present

## 2016-12-11 DIAGNOSIS — M9903 Segmental and somatic dysfunction of lumbar region: Secondary | ICD-10-CM | POA: Diagnosis not present

## 2016-12-11 DIAGNOSIS — M9905 Segmental and somatic dysfunction of pelvic region: Secondary | ICD-10-CM | POA: Diagnosis not present

## 2016-12-11 DIAGNOSIS — M9904 Segmental and somatic dysfunction of sacral region: Secondary | ICD-10-CM | POA: Diagnosis not present

## 2016-12-11 DIAGNOSIS — M25552 Pain in left hip: Secondary | ICD-10-CM | POA: Diagnosis not present

## 2016-12-12 DIAGNOSIS — M9904 Segmental and somatic dysfunction of sacral region: Secondary | ICD-10-CM | POA: Diagnosis not present

## 2016-12-12 DIAGNOSIS — M9905 Segmental and somatic dysfunction of pelvic region: Secondary | ICD-10-CM | POA: Diagnosis not present

## 2016-12-12 DIAGNOSIS — M25551 Pain in right hip: Secondary | ICD-10-CM | POA: Diagnosis not present

## 2016-12-12 DIAGNOSIS — M25552 Pain in left hip: Secondary | ICD-10-CM | POA: Diagnosis not present

## 2016-12-12 DIAGNOSIS — M5136 Other intervertebral disc degeneration, lumbar region: Secondary | ICD-10-CM | POA: Diagnosis not present

## 2016-12-12 DIAGNOSIS — M9903 Segmental and somatic dysfunction of lumbar region: Secondary | ICD-10-CM | POA: Diagnosis not present

## 2016-12-20 DIAGNOSIS — M5136 Other intervertebral disc degeneration, lumbar region: Secondary | ICD-10-CM | POA: Diagnosis not present

## 2016-12-20 DIAGNOSIS — M9901 Segmental and somatic dysfunction of cervical region: Secondary | ICD-10-CM | POA: Diagnosis not present

## 2016-12-20 DIAGNOSIS — M25552 Pain in left hip: Secondary | ICD-10-CM | POA: Diagnosis not present

## 2016-12-20 DIAGNOSIS — M25551 Pain in right hip: Secondary | ICD-10-CM | POA: Diagnosis not present

## 2016-12-20 DIAGNOSIS — M50322 Other cervical disc degeneration at C5-C6 level: Secondary | ICD-10-CM | POA: Diagnosis not present

## 2016-12-20 DIAGNOSIS — M9903 Segmental and somatic dysfunction of lumbar region: Secondary | ICD-10-CM | POA: Diagnosis not present

## 2016-12-20 DIAGNOSIS — M9905 Segmental and somatic dysfunction of pelvic region: Secondary | ICD-10-CM | POA: Diagnosis not present

## 2016-12-20 DIAGNOSIS — M9904 Segmental and somatic dysfunction of sacral region: Secondary | ICD-10-CM | POA: Diagnosis not present

## 2016-12-21 DIAGNOSIS — M9905 Segmental and somatic dysfunction of pelvic region: Secondary | ICD-10-CM | POA: Diagnosis not present

## 2016-12-21 DIAGNOSIS — M9904 Segmental and somatic dysfunction of sacral region: Secondary | ICD-10-CM | POA: Diagnosis not present

## 2016-12-21 DIAGNOSIS — M50322 Other cervical disc degeneration at C5-C6 level: Secondary | ICD-10-CM | POA: Diagnosis not present

## 2016-12-21 DIAGNOSIS — M25552 Pain in left hip: Secondary | ICD-10-CM | POA: Diagnosis not present

## 2016-12-21 DIAGNOSIS — M5136 Other intervertebral disc degeneration, lumbar region: Secondary | ICD-10-CM | POA: Diagnosis not present

## 2016-12-21 DIAGNOSIS — M9903 Segmental and somatic dysfunction of lumbar region: Secondary | ICD-10-CM | POA: Diagnosis not present

## 2016-12-21 DIAGNOSIS — M9901 Segmental and somatic dysfunction of cervical region: Secondary | ICD-10-CM | POA: Diagnosis not present

## 2016-12-21 DIAGNOSIS — M25551 Pain in right hip: Secondary | ICD-10-CM | POA: Diagnosis not present

## 2016-12-25 DIAGNOSIS — M50322 Other cervical disc degeneration at C5-C6 level: Secondary | ICD-10-CM | POA: Diagnosis not present

## 2016-12-25 DIAGNOSIS — M9905 Segmental and somatic dysfunction of pelvic region: Secondary | ICD-10-CM | POA: Diagnosis not present

## 2016-12-25 DIAGNOSIS — M9903 Segmental and somatic dysfunction of lumbar region: Secondary | ICD-10-CM | POA: Diagnosis not present

## 2016-12-25 DIAGNOSIS — M9901 Segmental and somatic dysfunction of cervical region: Secondary | ICD-10-CM | POA: Diagnosis not present

## 2016-12-25 DIAGNOSIS — M25552 Pain in left hip: Secondary | ICD-10-CM | POA: Diagnosis not present

## 2016-12-25 DIAGNOSIS — M5136 Other intervertebral disc degeneration, lumbar region: Secondary | ICD-10-CM | POA: Diagnosis not present

## 2016-12-25 DIAGNOSIS — M25551 Pain in right hip: Secondary | ICD-10-CM | POA: Diagnosis not present

## 2016-12-25 DIAGNOSIS — M9904 Segmental and somatic dysfunction of sacral region: Secondary | ICD-10-CM | POA: Diagnosis not present

## 2016-12-26 DIAGNOSIS — M25551 Pain in right hip: Secondary | ICD-10-CM | POA: Diagnosis not present

## 2016-12-26 DIAGNOSIS — M9905 Segmental and somatic dysfunction of pelvic region: Secondary | ICD-10-CM | POA: Diagnosis not present

## 2016-12-26 DIAGNOSIS — M5136 Other intervertebral disc degeneration, lumbar region: Secondary | ICD-10-CM | POA: Diagnosis not present

## 2016-12-26 DIAGNOSIS — M9904 Segmental and somatic dysfunction of sacral region: Secondary | ICD-10-CM | POA: Diagnosis not present

## 2016-12-26 DIAGNOSIS — M25552 Pain in left hip: Secondary | ICD-10-CM | POA: Diagnosis not present

## 2016-12-26 DIAGNOSIS — M9901 Segmental and somatic dysfunction of cervical region: Secondary | ICD-10-CM | POA: Diagnosis not present

## 2016-12-26 DIAGNOSIS — M9903 Segmental and somatic dysfunction of lumbar region: Secondary | ICD-10-CM | POA: Diagnosis not present

## 2016-12-26 DIAGNOSIS — M50322 Other cervical disc degeneration at C5-C6 level: Secondary | ICD-10-CM | POA: Diagnosis not present

## 2017-01-01 DIAGNOSIS — M9905 Segmental and somatic dysfunction of pelvic region: Secondary | ICD-10-CM | POA: Diagnosis not present

## 2017-01-01 DIAGNOSIS — M955 Acquired deformity of pelvis: Secondary | ICD-10-CM | POA: Diagnosis not present

## 2017-01-01 DIAGNOSIS — M9903 Segmental and somatic dysfunction of lumbar region: Secondary | ICD-10-CM | POA: Diagnosis not present

## 2017-01-01 DIAGNOSIS — M5416 Radiculopathy, lumbar region: Secondary | ICD-10-CM | POA: Diagnosis not present

## 2017-01-02 DIAGNOSIS — M9903 Segmental and somatic dysfunction of lumbar region: Secondary | ICD-10-CM | POA: Diagnosis not present

## 2017-01-02 DIAGNOSIS — M955 Acquired deformity of pelvis: Secondary | ICD-10-CM | POA: Diagnosis not present

## 2017-01-02 DIAGNOSIS — M5416 Radiculopathy, lumbar region: Secondary | ICD-10-CM | POA: Diagnosis not present

## 2017-01-02 DIAGNOSIS — M9905 Segmental and somatic dysfunction of pelvic region: Secondary | ICD-10-CM | POA: Diagnosis not present

## 2017-01-03 DIAGNOSIS — M5416 Radiculopathy, lumbar region: Secondary | ICD-10-CM | POA: Diagnosis not present

## 2017-01-03 DIAGNOSIS — M9903 Segmental and somatic dysfunction of lumbar region: Secondary | ICD-10-CM | POA: Diagnosis not present

## 2017-01-03 DIAGNOSIS — M9905 Segmental and somatic dysfunction of pelvic region: Secondary | ICD-10-CM | POA: Diagnosis not present

## 2017-01-03 DIAGNOSIS — M955 Acquired deformity of pelvis: Secondary | ICD-10-CM | POA: Diagnosis not present

## 2017-01-10 ENCOUNTER — Ambulatory Visit: Payer: Medicare Other | Admitting: Audiology

## 2017-01-17 DIAGNOSIS — M5416 Radiculopathy, lumbar region: Secondary | ICD-10-CM | POA: Diagnosis not present

## 2017-01-17 DIAGNOSIS — M9903 Segmental and somatic dysfunction of lumbar region: Secondary | ICD-10-CM | POA: Diagnosis not present

## 2017-01-17 DIAGNOSIS — M9905 Segmental and somatic dysfunction of pelvic region: Secondary | ICD-10-CM | POA: Diagnosis not present

## 2017-01-17 DIAGNOSIS — M955 Acquired deformity of pelvis: Secondary | ICD-10-CM | POA: Diagnosis not present

## 2017-01-18 DIAGNOSIS — M9903 Segmental and somatic dysfunction of lumbar region: Secondary | ICD-10-CM | POA: Diagnosis not present

## 2017-01-18 DIAGNOSIS — M5416 Radiculopathy, lumbar region: Secondary | ICD-10-CM | POA: Diagnosis not present

## 2017-01-18 DIAGNOSIS — M9905 Segmental and somatic dysfunction of pelvic region: Secondary | ICD-10-CM | POA: Diagnosis not present

## 2017-01-18 DIAGNOSIS — M955 Acquired deformity of pelvis: Secondary | ICD-10-CM | POA: Diagnosis not present

## 2017-01-21 ENCOUNTER — Encounter: Payer: Self-pay | Admitting: Neurology

## 2017-01-22 DIAGNOSIS — M5416 Radiculopathy, lumbar region: Secondary | ICD-10-CM | POA: Diagnosis not present

## 2017-01-22 DIAGNOSIS — M9905 Segmental and somatic dysfunction of pelvic region: Secondary | ICD-10-CM | POA: Diagnosis not present

## 2017-01-22 DIAGNOSIS — M9903 Segmental and somatic dysfunction of lumbar region: Secondary | ICD-10-CM | POA: Diagnosis not present

## 2017-01-22 DIAGNOSIS — M955 Acquired deformity of pelvis: Secondary | ICD-10-CM | POA: Diagnosis not present

## 2017-01-23 ENCOUNTER — Encounter (INDEPENDENT_AMBULATORY_CARE_PROVIDER_SITE_OTHER): Payer: Self-pay

## 2017-01-23 ENCOUNTER — Encounter: Payer: Self-pay | Admitting: Neurology

## 2017-01-23 ENCOUNTER — Ambulatory Visit (INDEPENDENT_AMBULATORY_CARE_PROVIDER_SITE_OTHER): Payer: Medicare Other | Admitting: Neurology

## 2017-01-23 VITALS — BP 136/70 | HR 62 | Ht 68.0 in | Wt 228.0 lb

## 2017-01-23 DIAGNOSIS — G4733 Obstructive sleep apnea (adult) (pediatric): Secondary | ICD-10-CM

## 2017-01-23 DIAGNOSIS — Z9989 Dependence on other enabling machines and devices: Secondary | ICD-10-CM | POA: Diagnosis not present

## 2017-01-23 NOTE — Progress Notes (Signed)
Subjective:    Patient ID: [name] is a 83 y.o. male.  HPI     Interim history:   [name] is an 83 year old right-handed gentleman with an underlying medical history of gout, reflux disease, diverticulitis, bilateral carotid artery stenosis, osteoarthritis, hypertension, skin cancer, history of osteomyelitis, hearing loss, seasonal allergies and obesity, who presents for follow-up consultation of his obstructive sleep apnea, after recent sleep study for re-evaluation. The patient is unaccompanied today. I first met him on 10/11/2016 at the request of his primary care physician, at which time he reported a prior diagnosis of obstructive sleep apnea and he had an older CPAP machine. He was advised to return for a repeat sleep study for requalification for sleep apnea treatment and a new CPAP machine. He had a split-night sleep study on 10/27/2016. I went over his test results with him in detail today. His baseline sleep efficiency was only 44.7%, REM sleep was absent during the first part of the study. Total AHI was 25.5 per hour, average oxygen saturation was 94%, nadir was 89%. He had moderate PLMS with moderate arousals. He was fitted with a nasal mask. CPAP was titrated from 5 cm to 10 cm. On final pressure his AHI was 0 per hour, some supine REM sleep was achieved and O2 nadir was 93%. Average oxygen saturation was 95%, nadir was 89%. He had no significant PLMS during the second part of the study and during the titration portion of the study sleep efficiency was 80.8% REM percentage was 22.5%. Based on his test results I prescribed a new CPAP machine for him.  Today, 01/23/2017 (all dictated new, as well as above notes, some dictation done in note pad or Word, outside of chart, may appear as copied):  I reviewed his CPAP compliance data from 12/23/2016 through 01/21/2017 which is a total of 30 days, during which time he used his machine 29 days with percent used days greater than 4 hours at  97%, indicating excellent compliance with an average usage of 9 hours and 35 minutes, residual AHI at goal at 2.2 per hour, leak acceptable with the 95th percentile at 16.1 L/m on a pressure of 10 cm with EPR. He reports, he occasional wakes up with a dull headache. He limits his water intake after 8 PM. He does like to drink wine, wife gives him a hard time if he drinks more than 3 glasses. He is using a medium Eson nasal mask. Has occasional leg cramps, but tonic water helps, no actual RLS symptoms.   The patient's allergies, current medications, family history, past medical history, past social history, past surgical history and problem list were reviewed and updated as appropriate.   Previously (copied from previous notes for reference):   10/11/2016: (He) was previously diagnosed with obstructive sleep apnea and placed on CPAP therapy years ago. Prior sleep study results are not available for my review today. He has an older CPAP machine, maybe 81 years old. A CPAP compliance download is reviewed today, from 09/11/2016 through 10/10/2016, which is a total of 30 days, pressure of 13.8, AHI of 2.1. This past month, leak low with the 95th percentile at 4.5 L/m. I reviewed your office note from 08/31/2016. His Epworth sleepiness score is 7 out of 24, fatigue score is 17 out of 63. He was diagnosed with OSA when he turned 14, but has had Sx of OSA way before then. Per patient, he had an AHI of about 40/hour. He moved from [place] in  12/17, as wife's family is here in [place], [place]. He has 2 step children, no biological kids. He retired from Economist job.  He has one dog. TV tends to stay on at night, but does not bother him. No telltale RLS symptoms, but was told he jerked a lot during sleep study.  His bedtime varies, usually between 8:30 and 11. His wake up time very is, usually between 5:30 AM to 7 AM. He does not have night to night nocturia. He has one sister who had sleep apnea. He has a total of 6  siblings. Lives with his wife. He is a nonsmoker, drinks alcohol daily about 2 glasses per evening, sometimes more on the weekends. He reports that he was a heavy drinker in the past, typically drinking liquor regularly. He does not drink much in the way of caffeine, usually one cup of coffee per day. He is fully compliant with CPAP machine. He would like to be able to get a new and updated machine.   His Past Medical History Is Significant For: Past Medical History:  Diagnosis Date  . Carotid stenosis, asymptomatic, bilateral 09/16/2016   Diffuse 1-39% bilaterally, but involving ICA, ECA, CCA - rpt 1 yr (09/2016)  . Diverticulitis   . GERD (gastroesophageal reflux disease)   . Gout   . History of osteomyelitis 10/2015   left foot  . History of squamous cell carcinoma of skin   . HTN (hypertension)   . OSA on CPAP 09/01/2016  . Osteoarthritis    back, neck, knees - multiple joints  . Personal history of malignant neoplasm of larynx 2009  . Seasonal allergies   . Sensorineural hearing loss (SNHL) of both ears 09/01/2016    His Past Surgical History Is Significant For: Past Surgical History:  Procedure Laterality Date  . ACHILLES TENDON REPAIR    . APPENDECTOMY    . THROAT SURGERY N/A 2009   ?vocal cord nodule removal  . TONSILLECTOMY    . TREATMENT FISTULA ANAL      His Family History Is Significant For: Family History  Problem Relation Age of Onset  . Hypertension Mother   . Hypertension Father   . Cancer Other        unknown - niece    His Social History Is Significant For: Social History   Social History  . Marital status: Married    Spouse name: N/A  . Number of children: N/A  . Years of education: N/A   Social History Main Topics  . Smoking status: Never Smoker  . Smokeless tobacco: Never Used  . Alcohol use Yes     Comment: 3 glasses wine/day, was heavy drinker  . Drug use: No  . Sexual activity: Not Asked   Other Topics Concern  . None   Social History  Narrative   Widower. 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 Lisinopril Cough  :   His Current Medications Are:  Outpatient Encounter Prescriptions as of 01/23/2017  Medication Sig  . esomeprazole (NEXIUM) 20 MG capsule Take 20 mg by mouth daily at 12 noon.  Marland Kitchen allopurinol (ZYLOPRIM) 300 MG tablet Take 1 tablet (300 mg total) by mouth daily.  Marland Kitchen aspirin EC 81 MG tablet Take 81 mg by mouth daily.  Marland Kitchen GLUCOSAMINE-CHONDROITIN-MSM PO Take 1 tablet by mouth daily.  . hydrochlorothiazide (MICROZIDE)  12.5 MG capsule Take 1 capsule (12.5 mg total) by mouth daily.  . Lactobacillus (ACIDOPHILUS PO) Take 1 capsule by mouth as needed.  . metoprolol succinate (TOPROL-XL) 50 MG 24 hr tablet Take 1 tablet (50 mg total) by mouth daily. Take with or immediately following a meal.  . Milk Thistle 1000 MG CAPS Take 1 capsule by mouth daily.  . Multiple Vitamins-Minerals (MULTIVITAMIN ADULT PO) Take 1 tablet by mouth daily.  . polyethylene glycol (MIRALAX / GLYCOLAX) packet Take 17 g by mouth daily.  . [DISCONTINUED] esomeprazole (NEXIUM) 40 MG capsule Take 20 mg by mouth daily.    No facility-administered encounter medications on file as of 01/23/2017.   :  Review of Systems:  Out of a complete 14 point review of systems, all are reviewed and negative with the exception of these symptoms as listed below: Review of Systems  Neurological:       Pt presents today to discuss his cpap. Pt is occasionally waking up with a headache. Pt is using [facility] as his DME.    Objective:  Neurological Exam  Physical Exam Physical Examination:   Vitals:   01/23/17 1513  BP: 136/70  Pulse: 62   General Examination: The patient is a very pleasant 83 y.o. male in no acute distress. He appears well-developed and well-nourished and well groomed.   HEENT: Normocephalic, atraumatic,  pupils are equal, round and reactive to light and accommodation. Funduscopic exam is normal with sharp disc margins noted. Extraocular tracking is good without limitation to gaze excursion or nystagmus noted. Normal smooth pursuit is noted. Hearing is impaired, hearing aids in place bilaterally. Face is symmetric with normal facial animation and normal facial sensation. Speech is clear with no dysarthria noted. There is no hypophonia. There is no lip, neck/head, jaw or voice tremor. Neck is supple with full range of passive and active motion. There are no carotid bruits on auscultation. Oropharynx exam reveals: moderate mouth dryness, adequate dental hygiene and moderate airway crowding. Tongue protrudes centrally and palate elevates symmetrically.    Chest: Clear to auscultation without wheezing, rhonchi or crackles noted.  Heart: S1+S2+0, regular and normal without murmurs, rubs or gallops noted.   Abdomen: Soft, non-tender and non-distended with normal bowel sounds appreciated on auscultation.  Extremities: There is no pitting edema in the distal lower extremities bilaterally. Pedal pulses are intact.  Skin: Warm and dry without trophic changes noted. Age related changes and changes in keeping with chronic sun exposure.  Musculoskeletal: exam reveals no obvious joint deformities, tenderness or joint swelling or erythema.   Neurologically:  Mental status: The patient is awake, alert and oriented in all 4 spheres. His immediate and remote memory, attention, language skills and fund of knowledge are appropriate. There is no evidence of aphasia, agnosia, apraxia or anomia. Speech is clear with normal prosody and enunciation. Thought process is linear. Mood is normal and affect is normal.  Cranial nerves II - XII are as described above under HEENT exam.  Motor exam: Normal bulk, strength and tone is noted. There is no drift, tremor or rebound. Fine motor skills and coordination: Grossly intact  for age.  Cerebellar testing: No dysmetria or intention tremor. There is no truncal or gait ataxia.  Sensory exam: intact to light touch in the upper and lower extremities.  Gait, station and balance: He stands easily. No veering to one side is noted. No leaning to one side is noted. Posture is age-appropriate and stance is narrow based. Gait shows normal  stride length and normal pace. No problems turning are noted.   Assessment and Plan:   In summary, [name] is a very pleasant 83 year old male with an underlying medical history of gout, reflux disease, diverticulitis, bilateral carotid artery stenosis, osteoarthritis, hypertension, skin cancer, history of osteomyelitis, hearing loss, seasonal allergies and obesity, who presents for follow Up consultation of his obstructive sleep apnea, after a recent split-night sleep study on 10/27/2016. This confirmed his sleep apnea diagnosis. He has been on CPAP therapy for years. He was able to get a new CPAP machine. I did lower his pressure to 10 cm as he did well with it. He is now also using a humidifier which he did not have before. He is compliant with treatment. He is doing quite well overall, has intermittent dull headaches but may not be hydrating very well. In fact, he does not often drink much water when he is at home and also limits his fluid intake after 8 PM so not to have to get up to use the bathroom at night. He also tends to sleep quite a bit, averaging over 9 hours, which leaves him quite under hydrated when he first wakes up. He is encouraged to drink more water and limit his sleep to at the most 8-1/2 hours. He is commended for his treatment adherence. Exam is stable. He is also encouraged to limit his alcohol intake to one glass of wine per day. I suggested a six-month recheck with one of our nurse practitioners. I answered all his questions today and we reviewed his sleep study results as well as his recent compliance data together. I  answered all his questions today and he was in agreement.  I spent 25 minutes in total face-to-face time with the patient, more than 50% of which was spent in counseling and coordination of care, reviewing test results, reviewing medication and discussing or reviewing the diagnosis of OSA, its prognosis and treatment options. Pertinent laboratory and imaging test results that were available during this visit with the patient were reviewed by me and considered in my medical decision making (see chart for details).

## 2017-01-23 NOTE — Patient Instructions (Addendum)
Please continue using your CPAP regularly. While your insurance requires that you use CPAP at least 4 hours each night on 70% of the nights, I recommend, that you not skip any nights and use it throughout the night if you can. Getting used to CPAP and staying with the treatment long term does take time and patience and discipline. Untreated obstructive sleep apnea when it is moderate to severe can have an adverse impact on cardiovascular health and raise her risk for heart disease, arrhythmias, hypertension, congestive heart failure, stroke and diabetes. Untreated obstructive sleep apnea causes sleep disruption, nonrestorative sleep, and sleep deprivation. This can have an impact on your day to day functioning and cause daytime sleepiness and impairment of cognitive function, memory loss, mood disturbance, and problems focussing. Using CPAP regularly can improve these symptoms.  Please hydrate better with water.   Please try to limit your sleep to 8 or 8 1/2 hours.   Keep up the good work! We can see you in 6 months.

## 2017-01-24 DIAGNOSIS — M9905 Segmental and somatic dysfunction of pelvic region: Secondary | ICD-10-CM | POA: Diagnosis not present

## 2017-01-24 DIAGNOSIS — M5416 Radiculopathy, lumbar region: Secondary | ICD-10-CM | POA: Diagnosis not present

## 2017-01-24 DIAGNOSIS — M9903 Segmental and somatic dysfunction of lumbar region: Secondary | ICD-10-CM | POA: Diagnosis not present

## 2017-01-24 DIAGNOSIS — M955 Acquired deformity of pelvis: Secondary | ICD-10-CM | POA: Diagnosis not present

## 2017-01-25 DIAGNOSIS — M9903 Segmental and somatic dysfunction of lumbar region: Secondary | ICD-10-CM | POA: Diagnosis not present

## 2017-01-25 DIAGNOSIS — M955 Acquired deformity of pelvis: Secondary | ICD-10-CM | POA: Diagnosis not present

## 2017-01-25 DIAGNOSIS — M9905 Segmental and somatic dysfunction of pelvic region: Secondary | ICD-10-CM | POA: Diagnosis not present

## 2017-01-25 DIAGNOSIS — M5416 Radiculopathy, lumbar region: Secondary | ICD-10-CM | POA: Diagnosis not present

## 2017-01-27 ENCOUNTER — Other Ambulatory Visit: Payer: Self-pay | Admitting: Family Medicine

## 2017-01-29 DIAGNOSIS — M9903 Segmental and somatic dysfunction of lumbar region: Secondary | ICD-10-CM | POA: Diagnosis not present

## 2017-01-29 DIAGNOSIS — M9905 Segmental and somatic dysfunction of pelvic region: Secondary | ICD-10-CM | POA: Diagnosis not present

## 2017-01-29 DIAGNOSIS — M5416 Radiculopathy, lumbar region: Secondary | ICD-10-CM | POA: Diagnosis not present

## 2017-01-29 DIAGNOSIS — M955 Acquired deformity of pelvis: Secondary | ICD-10-CM | POA: Diagnosis not present

## 2017-02-01 DIAGNOSIS — M5416 Radiculopathy, lumbar region: Secondary | ICD-10-CM | POA: Diagnosis not present

## 2017-02-01 DIAGNOSIS — M9903 Segmental and somatic dysfunction of lumbar region: Secondary | ICD-10-CM | POA: Diagnosis not present

## 2017-02-01 DIAGNOSIS — M9905 Segmental and somatic dysfunction of pelvic region: Secondary | ICD-10-CM | POA: Diagnosis not present

## 2017-02-01 DIAGNOSIS — M955 Acquired deformity of pelvis: Secondary | ICD-10-CM | POA: Diagnosis not present

## 2017-02-02 DIAGNOSIS — Z23 Encounter for immunization: Secondary | ICD-10-CM | POA: Diagnosis not present

## 2017-02-05 DIAGNOSIS — M9903 Segmental and somatic dysfunction of lumbar region: Secondary | ICD-10-CM | POA: Diagnosis not present

## 2017-02-05 DIAGNOSIS — M9905 Segmental and somatic dysfunction of pelvic region: Secondary | ICD-10-CM | POA: Diagnosis not present

## 2017-02-05 DIAGNOSIS — M955 Acquired deformity of pelvis: Secondary | ICD-10-CM | POA: Diagnosis not present

## 2017-02-05 DIAGNOSIS — M5416 Radiculopathy, lumbar region: Secondary | ICD-10-CM | POA: Diagnosis not present

## 2017-02-07 DIAGNOSIS — M955 Acquired deformity of pelvis: Secondary | ICD-10-CM | POA: Diagnosis not present

## 2017-02-07 DIAGNOSIS — M9903 Segmental and somatic dysfunction of lumbar region: Secondary | ICD-10-CM | POA: Diagnosis not present

## 2017-02-07 DIAGNOSIS — M5416 Radiculopathy, lumbar region: Secondary | ICD-10-CM | POA: Diagnosis not present

## 2017-02-07 DIAGNOSIS — M9905 Segmental and somatic dysfunction of pelvic region: Secondary | ICD-10-CM | POA: Diagnosis not present

## 2017-02-12 DIAGNOSIS — M9905 Segmental and somatic dysfunction of pelvic region: Secondary | ICD-10-CM | POA: Diagnosis not present

## 2017-02-12 DIAGNOSIS — M955 Acquired deformity of pelvis: Secondary | ICD-10-CM | POA: Diagnosis not present

## 2017-02-12 DIAGNOSIS — M9903 Segmental and somatic dysfunction of lumbar region: Secondary | ICD-10-CM | POA: Diagnosis not present

## 2017-02-12 DIAGNOSIS — M5416 Radiculopathy, lumbar region: Secondary | ICD-10-CM | POA: Diagnosis not present

## 2017-02-15 DIAGNOSIS — M9903 Segmental and somatic dysfunction of lumbar region: Secondary | ICD-10-CM | POA: Diagnosis not present

## 2017-02-15 DIAGNOSIS — L820 Inflamed seborrheic keratosis: Secondary | ICD-10-CM | POA: Diagnosis not present

## 2017-02-15 DIAGNOSIS — Z85828 Personal history of other malignant neoplasm of skin: Secondary | ICD-10-CM | POA: Diagnosis not present

## 2017-02-15 DIAGNOSIS — M5416 Radiculopathy, lumbar region: Secondary | ICD-10-CM | POA: Diagnosis not present

## 2017-02-15 DIAGNOSIS — L570 Actinic keratosis: Secondary | ICD-10-CM | POA: Diagnosis not present

## 2017-02-15 DIAGNOSIS — M955 Acquired deformity of pelvis: Secondary | ICD-10-CM | POA: Diagnosis not present

## 2017-02-15 DIAGNOSIS — M9905 Segmental and somatic dysfunction of pelvic region: Secondary | ICD-10-CM | POA: Diagnosis not present

## 2017-02-15 DIAGNOSIS — D1801 Hemangioma of skin and subcutaneous tissue: Secondary | ICD-10-CM | POA: Diagnosis not present

## 2017-02-15 DIAGNOSIS — L821 Other seborrheic keratosis: Secondary | ICD-10-CM | POA: Diagnosis not present

## 2017-02-15 DIAGNOSIS — L814 Other melanin hyperpigmentation: Secondary | ICD-10-CM | POA: Diagnosis not present

## 2017-02-15 DIAGNOSIS — D225 Melanocytic nevi of trunk: Secondary | ICD-10-CM | POA: Diagnosis not present

## 2017-02-21 DIAGNOSIS — M5416 Radiculopathy, lumbar region: Secondary | ICD-10-CM | POA: Diagnosis not present

## 2017-02-21 DIAGNOSIS — M9903 Segmental and somatic dysfunction of lumbar region: Secondary | ICD-10-CM | POA: Diagnosis not present

## 2017-02-21 DIAGNOSIS — M9905 Segmental and somatic dysfunction of pelvic region: Secondary | ICD-10-CM | POA: Diagnosis not present

## 2017-02-21 DIAGNOSIS — M955 Acquired deformity of pelvis: Secondary | ICD-10-CM | POA: Diagnosis not present

## 2017-02-28 ENCOUNTER — Ambulatory Visit: Payer: Medicare Other | Admitting: Family Medicine

## 2017-03-02 ENCOUNTER — Other Ambulatory Visit: Payer: Self-pay | Admitting: *Deleted

## 2017-03-02 DIAGNOSIS — I6523 Occlusion and stenosis of bilateral carotid arteries: Secondary | ICD-10-CM

## 2017-03-07 ENCOUNTER — Encounter: Payer: Self-pay | Admitting: Family Medicine

## 2017-03-07 ENCOUNTER — Ambulatory Visit (INDEPENDENT_AMBULATORY_CARE_PROVIDER_SITE_OTHER): Payer: Medicare Other | Admitting: Family Medicine

## 2017-03-07 VITALS — BP 126/64 | HR 52 | Temp 97.6°F | Wt 227.0 lb

## 2017-03-07 DIAGNOSIS — I10 Essential (primary) hypertension: Secondary | ICD-10-CM | POA: Diagnosis not present

## 2017-03-07 DIAGNOSIS — R252 Cramp and spasm: Secondary | ICD-10-CM | POA: Diagnosis not present

## 2017-03-07 DIAGNOSIS — Z7289 Other problems related to lifestyle: Secondary | ICD-10-CM

## 2017-03-07 DIAGNOSIS — Z8521 Personal history of malignant neoplasm of larynx: Secondary | ICD-10-CM | POA: Diagnosis not present

## 2017-03-07 DIAGNOSIS — E669 Obesity, unspecified: Secondary | ICD-10-CM | POA: Diagnosis not present

## 2017-03-07 DIAGNOSIS — K219 Gastro-esophageal reflux disease without esophagitis: Secondary | ICD-10-CM

## 2017-03-07 DIAGNOSIS — I6523 Occlusion and stenosis of bilateral carotid arteries: Secondary | ICD-10-CM | POA: Diagnosis not present

## 2017-03-07 DIAGNOSIS — Z789 Other specified health status: Secondary | ICD-10-CM

## 2017-03-07 DIAGNOSIS — H903 Sensorineural hearing loss, bilateral: Secondary | ICD-10-CM

## 2017-03-07 DIAGNOSIS — G4762 Sleep related leg cramps: Secondary | ICD-10-CM | POA: Insufficient documentation

## 2017-03-07 NOTE — Assessment & Plan Note (Signed)
Continues working on decreased use.

## 2017-03-07 NOTE — Patient Instructions (Addendum)
Ok to take tonic water for night time cramping. Try stretching legs out prior to bedtime.  You are doing well today. Continue medicines. Return as needed or in 6 months for medicare wellness visit with Katha Cabal and follow up visit with me

## 2017-03-07 NOTE — Assessment & Plan Note (Signed)
Congratulated on weight loss to date. Encouraged ongoing healthy diet choices.

## 2017-03-07 NOTE — Assessment & Plan Note (Signed)
Chronic, stable on nexium 20mg  daily.

## 2017-03-07 NOTE — Assessment & Plan Note (Signed)
Chronic, stable continue current regimen.  

## 2017-03-07 NOTE — Progress Notes (Signed)
BP 126/64 (BP Location: Left Arm, Patient Position: Sitting, Cuff Size: Large)   Pulse (!) 52   Temp 97.6 F (36.4 C) (Oral)   Wt 227 lb (103 kg)   SpO2 96%   BMI 34.52 kg/m    CC: 6 mo f/u visit Subjective:    Patient ID: [name], male    DOB: 1932/02/25, 83 y.o.   MRN: [id number]  HPI: [name] is a 83 y.o. male presenting on 03/07/2017 for 6 mo follow-up   Seeing chiropractor which helps his back. Overall feeling well.   Ongoing cramping of legs - charley-horse, previously treated with quinine sulfate. Tonic water does help as well. slo-mag didn't help. Mainly at night time, sometimes come on with driving.   Alcohol - 2-3 glasses of wine/day.  Work on diet and has lost weight - wife following weight watchers diet, so is he.   Sounds like he had difficulty getting in with audiologist , requests new referral today.   Relevant past medical, surgical, family and social history reviewed and updated as indicated. Interim medical history since our last visit reviewed. Allergies and medications reviewed and updated. Outpatient Medications Prior to Visit  Medication Sig Dispense Refill  . allopurinol (ZYLOPRIM) 300 MG tablet TAKE 1 TABLET BY MOUTH DAILY 90 tablet 0  . aspirin EC 81 MG tablet Take 81 mg by mouth daily.    Marland Kitchen esomeprazole (NEXIUM) 20 MG capsule Take 20 mg by mouth daily at 12 noon.    Marland Kitchen GLUCOSAMINE-CHONDROITIN-MSM PO Take 2 tablets by mouth daily.     . hydrochlorothiazide (MICROZIDE) 12.5 MG capsule Take 1 capsule (12.5 mg total) by mouth daily. 90 capsule 1  . Lactobacillus (ACIDOPHILUS PO) Take 1 capsule by mouth as needed.    . metoprolol succinate (TOPROL-XL) 50 MG 24 hr tablet Take 1 tablet (50 mg total) by mouth daily. Take with or immediately following a meal. 90 tablet 1  . Milk Thistle 1000 MG CAPS Take 1 capsule by mouth daily.    . Multiple Vitamins-Minerals (MULTIVITAMIN ADULT PO) Take 1 tablet by mouth daily.    . polyethylene glycol  (MIRALAX / GLYCOLAX) packet Take 17 g by mouth daily.     No facility-administered medications prior to visit.      Per HPI unless specifically indicated in ROS section below Review of Systems     Objective:    BP 126/64 (BP Location: Left Arm, Patient Position: Sitting, Cuff Size: Large)   Pulse (!) 52   Temp 97.6 F (36.4 C) (Oral)   Wt 227 lb (103 kg)   SpO2 96%   BMI 34.52 kg/m   Wt Readings from Last 3 Encounters:  03/07/17 227 lb (103 kg)  01/23/17 228 lb (103.4 kg)  11/20/16 238 lb 8 oz (108.2 kg)    Physical Exam  Constitutional: He appears well-developed and well-nourished. No distress.  HENT:  Mouth/Throat: Oropharynx is clear and moist. No oropharyngeal exudate.  Eyes: Pupils are equal, round, and reactive to light. Conjunctivae are normal.  Neck: Normal range of motion.  Woody consistency of neck s/p XRT  Cardiovascular: Normal rate, regular rhythm, normal heart sounds and intact distal pulses.   No murmur heard. Pulmonary/Chest: Effort normal and breath sounds normal. No respiratory distress. He has no wheezes. He has no rales.  Musculoskeletal: He exhibits no edema.  Lymphadenopathy:    He has no cervical adenopathy.  Nursing note and vitals reviewed.  Results for orders placed or performed in visit  on 09/21/16  CBC and differential  Result Value Ref Range   Hemoglobin 13.2 (A) 13.5 - 17.5 g/dL   Platelets 224 150 - 399 K/L   WBC 4.7 10^3/mL  VITAMIN D 25 Hydroxy (Vit-D Deficiency, Fractures)  Result Value Ref Range   Vit D, 25-Hydroxy 54.5   Basic metabolic panel  Result Value Ref Range   Creatinine 1.0 0.6 - 1.3 mg/dL  Lipid panel  Result Value Ref Range   Triglycerides 173 (A) 40 - 160 mg/dL   Cholesterol 137 0 - 200 mg/dL   HDL 34 (A) 35 - 70 mg/dL   LDL Cholesterol 68 mg/dL  Hepatic function panel  Result Value Ref Range   Alkaline Phosphatase 53 25 - 125 U/L   ALT 37 10 - 40 U/L   AST 38 14 - 40 U/L   Bilirubin, Total 0.7 mg/dL    Hemoglobin A1c  Result Value Ref Range   Hemoglobin A1C 5.9   TSH  Result Value Ref Range   TSH 3.42 0.41 - 5.90 uIU/mL      Assessment & Plan:   Problem List Items Addressed This Visit    Alcohol use    Continues working on decreased use.       GERD (gastroesophageal reflux disease)    Chronic, stable on nexium 20mg  daily.       HTN (hypertension)    Chronic, stable continue current regimen.       Leg cramps    Discussed tonic water, suggested stretching of legs prior to bedtime.       Obesity, Class I, BMI 30.0-34.9 (see actual BMI)    Congratulated on weight loss to date. Encouraged ongoing healthy diet choices.       Personal history of malignant neoplasm of larynx    ?of this - unclear if true cancer or just XRT "to be safe". Regardless, no signs of recurrence.      Sensorineural hearing loss (SNHL) of both ears - Primary    New audiology referral placed.       Relevant Orders   Ambulatory referral to Audiology       Follow up plan: Return in about 6 months (around 09/04/2017) for medicare wellness visit, follow up visit.  Ria Bush, MD

## 2017-03-07 NOTE — Assessment & Plan Note (Signed)
?  of this - unclear if true cancer or just XRT "to be safe". Regardless, no signs of recurrence.

## 2017-03-07 NOTE — Assessment & Plan Note (Signed)
New audiology referral placed.

## 2017-03-07 NOTE — Assessment & Plan Note (Signed)
Discussed tonic water, suggested stretching of legs prior to bedtime.

## 2017-03-10 ENCOUNTER — Other Ambulatory Visit: Payer: Self-pay | Admitting: Family Medicine

## 2017-03-13 DIAGNOSIS — H903 Sensorineural hearing loss, bilateral: Secondary | ICD-10-CM | POA: Diagnosis not present

## 2017-04-04 DIAGNOSIS — M5416 Radiculopathy, lumbar region: Secondary | ICD-10-CM | POA: Diagnosis not present

## 2017-04-04 DIAGNOSIS — M955 Acquired deformity of pelvis: Secondary | ICD-10-CM | POA: Diagnosis not present

## 2017-04-04 DIAGNOSIS — M9905 Segmental and somatic dysfunction of pelvic region: Secondary | ICD-10-CM | POA: Diagnosis not present

## 2017-04-04 DIAGNOSIS — M9903 Segmental and somatic dysfunction of lumbar region: Secondary | ICD-10-CM | POA: Diagnosis not present

## 2017-04-12 ENCOUNTER — Ambulatory Visit: Payer: Medicare Other | Admitting: Audiology

## 2017-04-20 ENCOUNTER — Other Ambulatory Visit: Payer: Self-pay | Admitting: Family Medicine

## 2017-04-23 DIAGNOSIS — M9905 Segmental and somatic dysfunction of pelvic region: Secondary | ICD-10-CM | POA: Diagnosis not present

## 2017-04-23 DIAGNOSIS — M9903 Segmental and somatic dysfunction of lumbar region: Secondary | ICD-10-CM | POA: Diagnosis not present

## 2017-04-23 DIAGNOSIS — M955 Acquired deformity of pelvis: Secondary | ICD-10-CM | POA: Diagnosis not present

## 2017-04-23 DIAGNOSIS — M5416 Radiculopathy, lumbar region: Secondary | ICD-10-CM | POA: Diagnosis not present

## 2017-04-25 DIAGNOSIS — M5416 Radiculopathy, lumbar region: Secondary | ICD-10-CM | POA: Diagnosis not present

## 2017-04-25 DIAGNOSIS — M955 Acquired deformity of pelvis: Secondary | ICD-10-CM | POA: Diagnosis not present

## 2017-04-25 DIAGNOSIS — M9903 Segmental and somatic dysfunction of lumbar region: Secondary | ICD-10-CM | POA: Diagnosis not present

## 2017-04-25 DIAGNOSIS — M9905 Segmental and somatic dysfunction of pelvic region: Secondary | ICD-10-CM | POA: Diagnosis not present

## 2017-05-21 ENCOUNTER — Other Ambulatory Visit: Payer: Self-pay | Admitting: Family Medicine

## 2017-05-21 DIAGNOSIS — M5416 Radiculopathy, lumbar region: Secondary | ICD-10-CM | POA: Diagnosis not present

## 2017-05-21 DIAGNOSIS — M9903 Segmental and somatic dysfunction of lumbar region: Secondary | ICD-10-CM | POA: Diagnosis not present

## 2017-05-21 DIAGNOSIS — M955 Acquired deformity of pelvis: Secondary | ICD-10-CM | POA: Diagnosis not present

## 2017-05-21 DIAGNOSIS — M9905 Segmental and somatic dysfunction of pelvic region: Secondary | ICD-10-CM | POA: Diagnosis not present

## 2017-05-23 DIAGNOSIS — M9903 Segmental and somatic dysfunction of lumbar region: Secondary | ICD-10-CM | POA: Diagnosis not present

## 2017-05-23 DIAGNOSIS — M5416 Radiculopathy, lumbar region: Secondary | ICD-10-CM | POA: Diagnosis not present

## 2017-05-23 DIAGNOSIS — M955 Acquired deformity of pelvis: Secondary | ICD-10-CM | POA: Diagnosis not present

## 2017-05-23 DIAGNOSIS — M9905 Segmental and somatic dysfunction of pelvic region: Secondary | ICD-10-CM | POA: Diagnosis not present

## 2017-05-28 DIAGNOSIS — M9905 Segmental and somatic dysfunction of pelvic region: Secondary | ICD-10-CM | POA: Diagnosis not present

## 2017-05-28 DIAGNOSIS — M955 Acquired deformity of pelvis: Secondary | ICD-10-CM | POA: Diagnosis not present

## 2017-05-28 DIAGNOSIS — M5416 Radiculopathy, lumbar region: Secondary | ICD-10-CM | POA: Diagnosis not present

## 2017-05-28 DIAGNOSIS — M9903 Segmental and somatic dysfunction of lumbar region: Secondary | ICD-10-CM | POA: Diagnosis not present

## 2017-06-05 ENCOUNTER — Other Ambulatory Visit: Payer: Self-pay | Admitting: Family Medicine

## 2017-06-08 ENCOUNTER — Other Ambulatory Visit: Payer: Self-pay | Admitting: Family Medicine

## 2017-06-11 DIAGNOSIS — M955 Acquired deformity of pelvis: Secondary | ICD-10-CM | POA: Diagnosis not present

## 2017-06-11 DIAGNOSIS — M9903 Segmental and somatic dysfunction of lumbar region: Secondary | ICD-10-CM | POA: Diagnosis not present

## 2017-06-11 DIAGNOSIS — M9905 Segmental and somatic dysfunction of pelvic region: Secondary | ICD-10-CM | POA: Diagnosis not present

## 2017-06-11 DIAGNOSIS — M5416 Radiculopathy, lumbar region: Secondary | ICD-10-CM | POA: Diagnosis not present

## 2017-06-25 DIAGNOSIS — M955 Acquired deformity of pelvis: Secondary | ICD-10-CM | POA: Diagnosis not present

## 2017-06-25 DIAGNOSIS — M9903 Segmental and somatic dysfunction of lumbar region: Secondary | ICD-10-CM | POA: Diagnosis not present

## 2017-06-25 DIAGNOSIS — M5416 Radiculopathy, lumbar region: Secondary | ICD-10-CM | POA: Diagnosis not present

## 2017-06-25 DIAGNOSIS — M9905 Segmental and somatic dysfunction of pelvic region: Secondary | ICD-10-CM | POA: Diagnosis not present

## 2017-07-18 ENCOUNTER — Encounter: Payer: Self-pay | Admitting: Adult Health

## 2017-07-23 ENCOUNTER — Ambulatory Visit: Payer: Medicare Other | Admitting: Adult Health

## 2017-07-23 DIAGNOSIS — M5416 Radiculopathy, lumbar region: Secondary | ICD-10-CM | POA: Diagnosis not present

## 2017-07-23 DIAGNOSIS — M9905 Segmental and somatic dysfunction of pelvic region: Secondary | ICD-10-CM | POA: Diagnosis not present

## 2017-07-23 DIAGNOSIS — M955 Acquired deformity of pelvis: Secondary | ICD-10-CM | POA: Diagnosis not present

## 2017-07-23 DIAGNOSIS — M9903 Segmental and somatic dysfunction of lumbar region: Secondary | ICD-10-CM | POA: Diagnosis not present

## 2017-08-13 ENCOUNTER — Other Ambulatory Visit: Payer: Self-pay | Admitting: Family Medicine

## 2017-08-13 ENCOUNTER — Ambulatory Visit (INDEPENDENT_AMBULATORY_CARE_PROVIDER_SITE_OTHER): Payer: Medicare Other

## 2017-08-13 DIAGNOSIS — M9903 Segmental and somatic dysfunction of lumbar region: Secondary | ICD-10-CM | POA: Diagnosis not present

## 2017-08-13 DIAGNOSIS — I6523 Occlusion and stenosis of bilateral carotid arteries: Secondary | ICD-10-CM

## 2017-08-13 DIAGNOSIS — M5416 Radiculopathy, lumbar region: Secondary | ICD-10-CM | POA: Diagnosis not present

## 2017-08-13 DIAGNOSIS — M955 Acquired deformity of pelvis: Secondary | ICD-10-CM | POA: Diagnosis not present

## 2017-08-13 DIAGNOSIS — M9905 Segmental and somatic dysfunction of pelvic region: Secondary | ICD-10-CM | POA: Diagnosis not present

## 2017-08-17 ENCOUNTER — Other Ambulatory Visit: Payer: Self-pay | Admitting: Family Medicine

## 2017-08-17 ENCOUNTER — Telehealth: Payer: Self-pay | Admitting: Family Medicine

## 2017-08-17 NOTE — Telephone Encounter (Signed)
Returning call.

## 2017-08-17 NOTE — Telephone Encounter (Signed)
Copied from [place] [id number]. Topic: Quick Communication - Other Results >> Aug 17, 2017  0:02 PM Brenton Grills, [place] wrote: Called patient to inform them of 08/13/17 carotic ultrasound results. When patient returns call, triage nurse may disclose results.

## 2017-08-17 NOTE — Telephone Encounter (Signed)
Left VM to return call, charted in result notes. 

## 2017-08-20 NOTE — Telephone Encounter (Signed)
Pt given results.    Notes recorded by Ria Bush, MD on 08/17/2017 at 12:41 PM EDT plz notify carotid US stable, did show some plaque in arteries but just recommend work towards good blood pressure and cholesterol control. rec rpt 1 yr.

## 2017-08-24 ENCOUNTER — Ambulatory Visit: Payer: PRIVATE HEALTH INSURANCE

## 2017-08-27 ENCOUNTER — Telehealth (INDEPENDENT_AMBULATORY_CARE_PROVIDER_SITE_OTHER): Payer: Medicare Other

## 2017-08-27 ENCOUNTER — Ambulatory Visit: Payer: PRIVATE HEALTH INSURANCE

## 2017-08-27 ENCOUNTER — Other Ambulatory Visit (INDEPENDENT_AMBULATORY_CARE_PROVIDER_SITE_OTHER): Payer: Medicare Other

## 2017-08-27 DIAGNOSIS — M1000 Idiopathic gout, unspecified site: Secondary | ICD-10-CM

## 2017-08-27 DIAGNOSIS — R809 Proteinuria, unspecified: Secondary | ICD-10-CM

## 2017-08-27 DIAGNOSIS — M955 Acquired deformity of pelvis: Secondary | ICD-10-CM | POA: Diagnosis not present

## 2017-08-27 DIAGNOSIS — I10 Essential (primary) hypertension: Secondary | ICD-10-CM | POA: Diagnosis not present

## 2017-08-27 DIAGNOSIS — M9903 Segmental and somatic dysfunction of lumbar region: Secondary | ICD-10-CM | POA: Diagnosis not present

## 2017-08-27 DIAGNOSIS — M5416 Radiculopathy, lumbar region: Secondary | ICD-10-CM | POA: Diagnosis not present

## 2017-08-27 DIAGNOSIS — M9905 Segmental and somatic dysfunction of pelvic region: Secondary | ICD-10-CM | POA: Diagnosis not present

## 2017-08-27 LAB — COMPREHENSIVE METABOLIC PANEL
ALT: 55 U/L — ABNORMAL HIGH (ref 0–53)
AST: 48 U/L — ABNORMAL HIGH (ref 0–37)
Albumin: 4.2 g/dL (ref 3.5–5.2)
Alkaline Phosphatase: 50 U/L (ref 39–117)
BUN: 26 mg/dL — AB (ref 6–23)
CHLORIDE: 106 meq/L (ref 96–112)
CO2: 28 meq/L (ref 19–32)
Calcium: 9.5 mg/dL (ref 8.4–10.5)
Creatinine, Ser: 1.14 mg/dL (ref 0.40–1.50)
GFR: 65.1 mL/min (ref >60.00)
GLUCOSE: 102 mg/dL — AB (ref 70–99)
POTASSIUM: 4.5 meq/L (ref 3.5–5.1)
SODIUM: 143 meq/L (ref 135–145)
TOTAL PROTEIN: 7.1 g/dL (ref 6.0–8.3)
Total Bilirubin: 0.6 mg/dL (ref 0.2–1.2)

## 2017-08-27 LAB — LIPID PANEL
CHOL/HDL RATIO: 4
Cholesterol: 141 mg/dL (ref 0–200)
HDL: 39 mg/dL — AB (ref >39.00)
LDL Cholesterol: 71 mg/dL (ref 0–99)
NONHDL: 102.35
Triglycerides: 159 mg/dL — ABNORMAL HIGH (ref 0.0–149.0)
VLDL: 31.8 mg/dL (ref 0.0–40.0)

## 2017-08-27 LAB — MICROALBUMIN / CREATININE URINE RATIO
CREATININE, U: 168.8 mg/dL
MICROALB UR: 3.3 mg/dL — AB (ref 0.0–1.9)
MICROALB/CREAT RATIO: 2 mg/g (ref 0.0–30.0)

## 2017-08-27 LAB — URIC ACID: URIC ACID, SERUM: 4.6 mg/dL (ref 4.0–7.8)

## 2017-08-27 NOTE — Telephone Encounter (Signed)
Labs for CPE?

## 2017-08-28 ENCOUNTER — Encounter: Payer: Self-pay | Admitting: Neurology

## 2017-08-28 ENCOUNTER — Ambulatory Visit (INDEPENDENT_AMBULATORY_CARE_PROVIDER_SITE_OTHER): Payer: Medicare Other

## 2017-08-28 ENCOUNTER — Other Ambulatory Visit: Payer: Self-pay | Admitting: Family Medicine

## 2017-08-28 VITALS — BP 136/70 | HR 58 | Temp 97.6°F | Ht 68.25 in | Wt 236.8 lb

## 2017-08-28 DIAGNOSIS — Z0000 Encounter for general adult medical examination without abnormal findings: Secondary | ICD-10-CM

## 2017-08-28 NOTE — Telephone Encounter (Signed)
Sent refills to [address] [address]/ [facility] ([facility])

## 2017-08-28 NOTE — Progress Notes (Signed)
PCP notes:  Health maintenance:  No gaps identified.  Abnormal screenings:   Mini-Cog: 19/20 MMSE - Mini Mental State Exam 08/28/2017 08/23/2016  Orientation to time 5 5  Orientation to Place 5 5  Registration 3 3  Attention/ Calculation 0 0  Recall 2 3  Recall-comments unable to recall 1 of 3 words -  Language- name 2 objects 0 0  Language- repeat 1 1  Language- follow 3 step command 3 3  Language- read & follow direction 0 0  Write a sentence 0 0  Copy design 0 0  Total score 19 20    Patient concerns:   A. Fatigue- becoming worse. Now must take daily naps.  B. ASA - should he continue to take medication?  C. Referral needed for eye care professional  Nurse concerns:  None  Next PCP appt:   09/04/17 @ 1030

## 2017-08-28 NOTE — Progress Notes (Signed)
Subjective:   [name] is a 84 y.o. male who presents for Medicare Annual/Subsequent preventive examination.  Review of Systems:  N/A Cardiac Risk Factors include: advanced age (>82men, >56 women);hypertension;obesity (BMI >30kg/m2)     Objective:    Vitals: BP 136/70 (BP Location: Left Arm, Patient Position: Sitting, Cuff Size: Normal)   Pulse (!) 58   Temp 97.6 F (36.4 C) (Oral)   Ht 5' 8.25" (1.734 m) Comment: no shoes  Wt 236 lb 12 oz (107.4 kg)   SpO2 97%   BMI 35.73 kg/m   Body mass index is 35.73 kg/m.  Advanced Directives 08/28/2017 08/23/2016  Does Patient Have a Medical Advance Directive? No Yes  Type of Advance Directive - [place];Living will  Copy of [place] in Chart? - No - copy requested  Would patient like information on creating a medical advance directive? No - Patient declined -    Tobacco Social History   Tobacco Use  Smoking Status Never Smoker  Smokeless Tobacco Never Used     Counseling given: No   Clinical Intake:  Pre-visit preparation completed: Yes  Pain : 0-10 Pain Score: 3  Pain Type: Chronic pain Pain Location: Back Pain Orientation: Lower Pain Onset: More than a month ago Pain Frequency: Constant     Nutritional Status: BMI > 30  Obese Nutritional Risks: None Diabetes: No  How often do you need to have someone help you when you read instructions, pamphlets, or other written materials from your doctor or pharmacy?: 1 - Never What is the last grade level you completed in school?: Bachelors degree  Interpreter Needed?: No  Comments: pt lives with spouse Information entered by :: LPinson, LPN  Past Medical History:  Diagnosis Date  . Carotid stenosis, asymptomatic, bilateral 09/16/2016   Diffuse 1-39% bilaterally, but involving ICA, ECA, CCA - rpt 1 yr (09/2016)  . Diverticulitis   . GERD (gastroesophageal reflux disease)   . Gout   . History of osteomyelitis 10/2015   left foot  . History of squamous cell carcinoma of skin   . HTN (hypertension)   . OSA on CPAP 09/01/2016  . Osteoarthritis    back, neck, knees - multiple joints  . Personal history of malignant neoplasm of larynx 2009   s/p XRT  . Seasonal allergies   . Sensorineural hearing loss (SNHL) of both ears 09/01/2016   Past Surgical History:  Procedure Laterality Date  . ACHILLES TENDON REPAIR    . APPENDECTOMY    . THROAT SURGERY N/A 2009   ?vocal cord vs larynx nodule removal, had XRT after this but states it was not cancer  . TONSILLECTOMY    . TREATMENT FISTULA ANAL     Family History  Problem Relation Age of Onset  . Hypertension Mother   . Hypertension Father   . Cancer Other        unknown - niece   Social History   Socioeconomic History  . Marital status: Married    Spouse name: Not on file  . Number of children: Not on file  . Years of education: Not on file  . Highest education level: Not on file  Occupational History  . Not on file  Social Needs  . Financial resource strain: Not on file  . Food insecurity:    Worry: Not on file    Inability: Not on file  . Transportation needs:    Medical: Not on file    Non-medical:  Not on file  Tobacco Use  . Smoking status: Never Smoker  . Smokeless tobacco: Never Used  Substance and Sexual Activity  . Alcohol use: Yes    Comment: 3 glasses wine per week  . Drug use: No  . Sexual activity: Not on file  Lifestyle  . Physical activity:    Days per week: Not on file    Minutes per session: Not on file  . Stress: Not on file  Relationships  . Social connections:    Talks on phone: Not on file    Gets together: Not on file    Attends religious service: Not on file    Active member of club or organization: Not on file    Attends meetings of clubs or organizations: Not on file    Relationship status: Not on file  Other Topics Concern  . Not on file  Social History Narrative   Widower. Second marriage - step-father  of Kathrynn Running   Occ: retired [employer]   Edu: college chemistry degree    Outpatient Encounter Medications as of 08/28/2017  Medication Sig  . allopurinol (ZYLOPRIM) 300 MG tablet TAKE 1 TABLET BY MOUTH DAILY  . aspirin EC 81 MG tablet Take 81 mg by mouth daily.  Marland Kitchen esomeprazole (NEXIUM) 20 MG capsule Take 20 mg by mouth daily at 12 noon.  Marland Kitchen GLUCOSAMINE-CHONDROITIN-MSM PO Take 2 tablets by mouth daily.   . Lactobacillus (ACIDOPHILUS PO) Take 1 capsule by mouth as needed.  . Milk Thistle 1000 MG CAPS Take 1 capsule by mouth daily.  . Multiple Vitamins-Minerals (MULTIVITAMIN ADULT PO) Take 1 tablet by mouth daily.  . polyethylene glycol (MIRALAX / GLYCOLAX) packet Take 17 g by mouth daily.  . [DISCONTINUED] hydrochlorothiazide (MICROZIDE) 12.5 MG capsule TAKE 1 CAPSULE(12.5 MG) BY MOUTH DAILY  . [DISCONTINUED] metoprolol succinate (TOPROL-XL) 50 MG 24 hr tablet TAKE 1 TABLET BY MOUTH EVERY DAY WITH OR IMMEDIATELY FOLLOWING A MEAL   No facility-administered encounter medications on file as of 08/28/2017.     Activities of Daily Living In your present state of health, do you have any difficulty performing the following activities: 08/28/2017  Hearing? Y  Vision? N  Difficulty concentrating or making decisions? N  Walking or climbing stairs? N  Dressing or bathing? N  Doing errands, shopping? N  Preparing Food and eating ? N  Using the Toilet? N  In the past six months, have you accidently leaked urine? N  Do you have problems with loss of bowel control? N  Managing your Medications? N  Managing your Finances? N  Housekeeping or managing your Housekeeping? N  Some recent data might be hidden    Patient Care Team: Ria Bush, MD as PCP - General (Family Medicine)   Assessment:   This is a routine wellness examination for [place].  Hearing Screening Comments: Bilateral hearing aids Vision Screening Comments: Last vision exam approx. 15 mths ago  Exercise  Activities and Dietary recommendations Current Exercise Habits: The patient does not participate in regular exercise at present, Exercise limited by: None identified  Goals    . DIET - INCREASE WATER INTAKE     Starting 08/28/2017, I will attempt to drink at least 6-8 glasses of water daily.        Fall Risk Fall Risk  08/28/2017 08/23/2016  Falls in the past year? No No   Depression Screen PHQ 2/9 Scores 08/28/2017 08/23/2016  PHQ - 2 Score 0 0  PHQ- 9  Score 0 -    Cognitive Function MMSE - Mini Mental State Exam 08/28/2017 08/23/2016  Orientation to time 5 5  Orientation to Place 5 5  Registration 3 3  Attention/ Calculation 0 0  Recall 2 3  Recall-comments unable to recall 1 of 3 words -  Language- name 2 objects 0 0  Language- repeat 1 1  Language- follow 3 step command 3 3  Language- read & follow direction 0 0  Write a sentence 0 0  Copy design 0 0  Total score 19 20     PLEASE NOTE: A Mini-Cog screen was completed. Maximum score is 20. A value of 0 denotes this part of [place] MMSE was not completed or the patient failed this part of the Mini-Cog screening.   Mini-Cog Screening Orientation to Time - Max 5 pts Orientation to Place - Max 5 pts Registration - Max 3 pts Recall - Max 3 pts Language Repeat - Max 1 pts Language Follow 3 Step Command - Max 3 pts     Immunization History  Administered Date(s) Administered  . Hepatitis A 12/06/2012  . Influenza-Unspecified 01/19/2016, 01/30/2017  . Pneumococcal Conjugate-13 07/21/2014  . Pneumococcal Polysaccharide-23 09/09/2010  . Td 08/06/2004  . Tdap 09/10/2012  . Zoster 02/06/2008    Screening Tests Health Maintenance  Topic Date Due  . INFLUENZA VACCINE  12/06/2017  . DTaP/Tdap/Td (2 - Td) 09/11/2022  . TETANUS/TDAP  09/11/2022  . PNA vac Low Risk Adult  Completed       Plan:     I have personally reviewed, addressed, and noted the following in the patient's chart:  A. Medical and social  history B. Use of alcohol, tobacco or illicit drugs  C. Current medications and supplements D. Functional ability and status E.  Nutritional status F.  Physical activity G. Advance directives H. List of other physicians I.  Hospitalizations, surgeries, and ER visits in previous 12 months J.  [place] to include hearing, vision, cognitive, depression L. Referrals and appointments - none  In addition, I have reviewed and discussed with patient certain preventive protocols, quality metrics, and best practice recommendations. A written personalized care plan for preventive services as well as general preventive health recommendations were provided to patient.  See attached scanned questionnaire for additional information.   Signed,   Lindell Noe, MHA, BS, LPN Health Coach

## 2017-08-28 NOTE — Patient Instructions (Signed)
[name] , Thank you for taking time to come for your Medicare Wellness Visit. I appreciate your ongoing commitment to your health goals. Please review the following plan we discussed and let me know if I can assist you in the future.   These are the goals we discussed: Goals    . DIET - INCREASE WATER INTAKE     Starting 08/28/2017, I will attempt to drink at least 6-8 glasses of water daily.        This is a list of the screening recommended for you and due dates:  Health Maintenance  Topic Date Due  . Flu Shot  12/06/2017  . DTaP/Tdap/Td vaccine (2 - Td) 09/11/2022  . Tetanus Vaccine  09/11/2022  . Pneumonia vaccines  Completed   Preventive Care for Adults  A healthy lifestyle and preventive care can promote health and wellness. Preventive health guidelines for adults include the following key practices.  . A routine yearly physical is a good way to check with your health care provider about your health and preventive screening. It is a chance to share any concerns and updates on your health and to receive a thorough exam.  . Visit your dentist for a routine exam and preventive care every 6 months. Brush your teeth twice a day and floss once a day. Good oral hygiene prevents tooth decay and gum disease.  . The frequency of eye exams is based on your age, health, family medical history, use  of contact lenses, and other factors. Follow your health care provider's recommendations for frequency of eye exams.  . Eat a healthy diet. Foods like vegetables, fruits, whole grains, low-fat dairy products, and lean protein foods contain the nutrients you need without too many calories. Decrease your intake of foods high in solid fats, added sugars, and salt. Eat the right amount of calories for you. Get information about a proper diet from your health care provider, if necessary.  . Regular physical exercise is one of the most important things you can do for your health. Most adults should  get at least 150 minutes of moderate-intensity exercise (any activity that increases your heart rate and causes you to sweat) each week. In addition, most adults need muscle-strengthening exercises on 2 or more days a week.  Silver Sneakers may be a benefit available to you. To determine eligibility, you may visit the website: www.silversneakers.com or contact program at 585-351-4629 Mon-Fri between 8AM-8PM.   . Maintain a healthy weight. The body mass index (BMI) is a screening tool to identify possible weight problems. It provides an estimate of body fat based on height and weight. Your health care provider can find your BMI and can help you achieve or maintain a healthy weight.   For adults 20 years and older: ? A BMI below 18.5 is considered underweight. ? A BMI of 18.5 to 24.9 is normal. ? A BMI of 25 to 29.9 is considered overweight. ? A BMI of 30 and above is considered obese.   . Maintain normal blood lipids and cholesterol levels by exercising and minimizing your intake of saturated fat. Eat a balanced diet with plenty of fruit and vegetables. Blood tests for lipids and cholesterol should begin at age 61 and be repeated every 5 years. If your lipid or cholesterol levels are high, you are over 50, or you are at high risk for heart disease, you may need your cholesterol levels checked more frequently. Ongoing high lipid and cholesterol levels should be treated  with medicines if diet and exercise are not working.  . If you smoke, find out from your health care provider how to quit. If you do not use tobacco, please do not start.  . If you choose to drink alcohol, please do not consume more than 2 drinks per day. One drink is considered to be 12 ounces (355 mL) of beer, 5 ounces (148 mL) of wine, or 1.5 ounces (44 mL) of liquor.  . If you are 65-23 years old, ask your health care provider if you should take aspirin to prevent strokes.  . Use sunscreen. Apply sunscreen liberally and  repeatedly throughout the day. You should seek shade when your shadow is shorter than you. Protect yourself by wearing long sleeves, pants, a wide-brimmed hat, and sunglasses year round, whenever you are outdoors.  . Once a month, do a whole body skin exam, using a mirror to look at the skin on your back. Tell your health care provider of new moles, moles that have irregular borders, moles that are larger than a pencil eraser, or moles that have changed in shape or color.

## 2017-08-28 NOTE — Telephone Encounter (Signed)
Noted  

## 2017-08-29 ENCOUNTER — Ambulatory Visit (INDEPENDENT_AMBULATORY_CARE_PROVIDER_SITE_OTHER): Payer: Medicare Other | Admitting: Adult Health

## 2017-08-29 ENCOUNTER — Encounter: Payer: Self-pay | Admitting: Adult Health

## 2017-08-29 VITALS — BP 124/69 | HR 66 | Ht 68.5 in | Wt 235.0 lb

## 2017-08-29 DIAGNOSIS — I6523 Occlusion and stenosis of bilateral carotid arteries: Secondary | ICD-10-CM

## 2017-08-29 DIAGNOSIS — G4733 Obstructive sleep apnea (adult) (pediatric): Secondary | ICD-10-CM

## 2017-08-29 DIAGNOSIS — Z9989 Dependence on other enabling machines and devices: Secondary | ICD-10-CM | POA: Diagnosis not present

## 2017-08-29 NOTE — Patient Instructions (Signed)
Your Plan:  Continue using CPAP nightly and >4 hours each night Will increase to 11 cm H20 If your symptoms worsen or you develop new symptoms please let us know.   Thank you for coming to see [place] at [facility]. I hope we have been able to provide you high quality care today.  You may receive a patient satisfaction survey over the next few weeks. We would appreciate your feedback and comments so that we may continue to improve ourselves and the health of our patients.

## 2017-08-29 NOTE — Progress Notes (Addendum)
PATIENT: [name] DOB: 11-06-1931  REASON FOR VISIT: follow up HISTORY FROM: patient  HISTORY OF PRESENT ILLNESS: Today 08/29/17 [name] is an 84 year old male with a history of obstructive sleep apnea on CPAP.  His download indicates that he use his machine nightly for compliance of 100%.  Every night he uses machine greater than 4 hours.  On average he uses his machine 8 hours and 56 minutes.  His residual AHI is 2.6 on 10 cm of water with EPR of 1.  He does not have a significant leak.  He states that since he is been on 10 cm of water he feels that he wakes up more.  He also states that his wife has noted that he is snoring.  He denies any significant daytime sleepiness.  He returns today for an evaluation.  HISTORY 01/23/2017 (Copied from Dr. Guadelupe Sabin note):  I reviewed his CPAP compliance data from 12/23/2016 through 01/21/2017 which is a total of 30 days, during which time he used his machine 29 days with percent used days greater than 4 hours at 97%, indicating excellent compliance with an average usage of 9 hours and 35 minutes, residual AHI at goal at 2.2 per hour, leak acceptable with the 95th percentile at 16.1 L/m on a pressure of 10 cm with EPR. He reports, he occasional wakes up with a dull headache. He limits his water intake after 8 PM. He does like to drink wine, wife gives him a hard time if he drinks more than 3 glasses. He is using a medium Eson nasal mask. Has occasional leg cramps, but tonic water helps, no actual RLS symptoms.   The patient's allergies, current medications, family history, past medical history, past social history, past surgical history and problem list were reviewed and updated as appropriate.     REVIEW OF SYSTEMS: Out of a complete 14 system review of symptoms, the patient complains only of the following symptoms, and all other reviewed systems are negative.  See HPI  ALLERGIES: Allergies  Allergen Reactions  . Lisinopril Cough     HOME MEDICATIONS: Outpatient Medications Prior to Visit  Medication Sig Dispense Refill  . allopurinol (ZYLOPRIM) 300 MG tablet TAKE 1 TABLET BY MOUTH DAILY 90 tablet 0  . aspirin EC 81 MG tablet Take 81 mg by mouth daily.    Marland Kitchen esomeprazole (NEXIUM) 20 MG capsule Take 20 mg by mouth daily at 12 noon.    Marland Kitchen GLUCOSAMINE-CHONDROITIN-MSM PO Take 2 tablets by mouth daily.     . hydrochlorothiazide (MICROZIDE) 12.5 MG capsule TAKE 1 CAPSULE(12.5 MG) BY MOUTH DAILY 90 capsule 0  . Lactobacillus (ACIDOPHILUS PO) Take 1 capsule by mouth as needed.    . metoprolol succinate (TOPROL-XL) 50 MG 24 hr tablet TAKE 1 TABLET BY MOUTH EVERY DAY WITH OR IMMEDIATELY FOLLOWING A MEAL 90 tablet 0  . Milk Thistle 1000 MG CAPS Take 1 capsule by mouth daily.    . Multiple Vitamins-Minerals (MULTIVITAMIN ADULT PO) Take 1 tablet by mouth daily.    . polyethylene glycol (MIRALAX / GLYCOLAX) packet Take 17 g by mouth daily.     No facility-administered medications prior to visit.     PAST MEDICAL HISTORY: Past Medical History:  Diagnosis Date  . Carotid stenosis, asymptomatic, bilateral 09/16/2016   Diffuse 1-39% bilaterally, but involving ICA, ECA, CCA - rpt 1 yr (09/2016)  . Diverticulitis   . GERD (gastroesophageal reflux disease)   . Gout   . History of osteomyelitis 10/2015  left foot  . History of squamous cell carcinoma of skin   . HTN (hypertension)   . OSA on CPAP 09/01/2016  . Osteoarthritis    back, neck, knees - multiple joints  . Personal history of malignant neoplasm of larynx 2009   s/p XRT  . Seasonal allergies   . Sensorineural hearing loss (SNHL) of both ears 09/01/2016    PAST SURGICAL HISTORY: Past Surgical History:  Procedure Laterality Date  . ACHILLES TENDON REPAIR    . APPENDECTOMY    . THROAT SURGERY N/A 2009   ?vocal cord vs larynx nodule removal, had XRT after this but states it was not cancer  . TONSILLECTOMY    . TREATMENT FISTULA ANAL      FAMILY HISTORY: Family  History  Problem Relation Age of Onset  . Hypertension Mother   . Hypertension Father   . Cancer Other        unknown - niece    SOCIAL HISTORY: Social History   Socioeconomic History  . Marital status: Married    Spouse name: Not on file  . Number of children: Not on file  . Years of education: Not on file  . Highest education level: Not on file  Occupational History  . Not on file  Social Needs  . Financial resource strain: Not on file  . Food insecurity:    Worry: Not on file    Inability: Not on file  . Transportation needs:    Medical: Not on file    Non-medical: Not on file  Tobacco Use  . Smoking status: Never Smoker  . Smokeless tobacco: Never Used  Substance and Sexual Activity  . Alcohol use: Yes    Comment: 3 glasses wine per week  . Drug use: No  . Sexual activity: Not on file  Lifestyle  . Physical activity:    Days per week: Not on file    Minutes per session: Not on file  . Stress: Not on file  Relationships  . Social connections:    Talks on phone: Not on file    Gets together: Not on file    Attends religious service: Not on file    Active member of club or organization: Not on file    Attends meetings of clubs or organizations: Not on file    Relationship status: Not on file  . Intimate partner violence:    Fear of current or ex partner: Not on file    Emotionally abused: Not on file    Physically abused: Not on file    Forced sexual activity: Not on file  Other Topics Concern  . Not on file  Social History Narrative   Widower. Second marriage - step-father of Kathrynn Running   Occ: retired [employer]: college chemistry degree      PHYSICAL EXAM  Vitals:   08/29/17 1350  BP: 124/69  Pulse: 66  Weight: 235 lb (106.6 kg)  Height: 5' 8.5" (1.74 m)   Body mass index is 35.21 kg/m.  Generalized: Well developed, in no acute distress   Neurological examination  Mentation: Alert oriented to time, place, history  taking. Follows all commands speech and language fluent Cranial nerve II-XII: Pupils were equal round reactive to light. Extraocular movements were full, visual field were full on confrontational test. Facial sensation and strength were normal. Uvula tongue midline. Head turning and shoulder shrug  were normal and symmetric.  Neck circumference 17-1/2 inches Motor: The  motor testing reveals 5 over 5 strength of all 4 extremities. Good symmetric motor tone is noted throughout.  Sensory: Sensory testing is intact to soft touch on all 4 extremities. No evidence of extinction is noted.  Coordination: Cerebellar testing reveals good finger-nose-finger and heel-to-shin bilaterally.  Gait and station: Gait is normal.  Reflexes: Deep tendon reflexes are symmetric and normal bilaterally.   DIAGNOSTIC DATA (LABS, IMAGING, TESTING) - I reviewed patient records, labs, notes, testing and imaging myself where available.  Lab Results  Component Value Date   WBC 4.7 03/12/2015   HGB 13.2 (A) 03/12/2015   PLT 224 03/12/2015      Component Value Date/Time   NA 143 08/27/2017 0856   K 4.5 08/27/2017 0856   CL 106 08/27/2017 0856   CO2 28 08/27/2017 0856   GLUCOSE 102 (H) 08/27/2017 0856   BUN 26 (H) 08/27/2017 0856   CREATININE 1.14 08/27/2017 0856   CALCIUM 9.5 08/27/2017 0856   PROT 7.1 08/27/2017 0856   ALBUMIN 4.2 08/27/2017 0856   AST 48 (H) 08/27/2017 0856   ALT 55 (H) 08/27/2017 0856   ALKPHOS 50 08/27/2017 0856   BILITOT 0.6 08/27/2017 0856   Lab Results  Component Value Date   CHOL 141 08/27/2017   HDL 39.00 (L) 08/27/2017   LDLCALC 71 08/27/2017   TRIG 159.0 (H) 08/27/2017   CHOLHDL 4 08/27/2017   Lab Results  Component Value Date   HGBA1C 5.9 03/12/2015   No results found for: CBULAGTX64 Lab Results  Component Value Date   TSH 3.42 03/12/2015      ASSESSMENT AND PLAN 83 y.o. year old male  has a past medical history of Carotid stenosis, asymptomatic, bilateral  (09/16/2016), Diverticulitis, GERD (gastroesophageal reflux disease), Gout, History of osteomyelitis (10/2015), History of squamous cell carcinoma of skin, HTN (hypertension), OSA on CPAP (09/01/2016), Osteoarthritis, Personal history of malignant neoplasm of larynx (2009), Seasonal allergies, and Sensorineural hearing loss (SNHL) of both ears (09/01/2016). here with:  1.  Obstructive sleep apnea on CPAP  Overall the patient is doing well.  His download indicates great compliance and good treatment of his apnea.  He does note that he is waking up more as well as his wife is complaining of him snoring.  I will increase his pressure to 11 cm of water to see if this offers him any additional benefit.  If he cannot tolerate this increase in pressure he will let us know.  He will follow-up in 1 year or sooner if needed.  I spent 15 minutes with the patient. 50% of this time was spent reviewing his CPAP download   Ward Givens, MSN, NP-C 08/29/2017, 1:50 PM [facility] [address], [place],  [address] [phone]  I reviewed the above note and documentation by the Nurse Practitioner and agree with the history, physical exam, assessment and plan as outlined above. I was immediately available for face-to-face consultation. Star Age, MD, PhD [facility])

## 2017-08-30 DIAGNOSIS — M955 Acquired deformity of pelvis: Secondary | ICD-10-CM | POA: Diagnosis not present

## 2017-08-30 DIAGNOSIS — M5416 Radiculopathy, lumbar region: Secondary | ICD-10-CM | POA: Diagnosis not present

## 2017-08-30 DIAGNOSIS — M9905 Segmental and somatic dysfunction of pelvic region: Secondary | ICD-10-CM | POA: Diagnosis not present

## 2017-08-30 DIAGNOSIS — M9903 Segmental and somatic dysfunction of lumbar region: Secondary | ICD-10-CM | POA: Diagnosis not present

## 2017-09-03 ENCOUNTER — Encounter: Payer: Medicare Other | Admitting: Family Medicine

## 2017-09-03 DIAGNOSIS — M9903 Segmental and somatic dysfunction of lumbar region: Secondary | ICD-10-CM | POA: Diagnosis not present

## 2017-09-03 DIAGNOSIS — M5416 Radiculopathy, lumbar region: Secondary | ICD-10-CM | POA: Diagnosis not present

## 2017-09-03 DIAGNOSIS — M955 Acquired deformity of pelvis: Secondary | ICD-10-CM | POA: Diagnosis not present

## 2017-09-03 DIAGNOSIS — M9905 Segmental and somatic dysfunction of pelvic region: Secondary | ICD-10-CM | POA: Diagnosis not present

## 2017-09-04 ENCOUNTER — Encounter: Payer: Self-pay | Admitting: Family Medicine

## 2017-09-04 ENCOUNTER — Encounter: Payer: Medicare Other | Admitting: Family Medicine

## 2017-09-04 ENCOUNTER — Ambulatory Visit (INDEPENDENT_AMBULATORY_CARE_PROVIDER_SITE_OTHER): Payer: Medicare Other | Admitting: Family Medicine

## 2017-09-04 VITALS — BP 140/62 | HR 56 | Temp 97.8°F | Ht 68.25 in | Wt 232.0 lb

## 2017-09-04 DIAGNOSIS — M1A00X0 Idiopathic chronic gout, unspecified site, without tophus (tophi): Secondary | ICD-10-CM

## 2017-09-04 DIAGNOSIS — Z7189 Other specified counseling: Secondary | ICD-10-CM | POA: Diagnosis not present

## 2017-09-04 DIAGNOSIS — I10 Essential (primary) hypertension: Secondary | ICD-10-CM

## 2017-09-04 DIAGNOSIS — E669 Obesity, unspecified: Secondary | ICD-10-CM

## 2017-09-04 DIAGNOSIS — Z1283 Encounter for screening for malignant neoplasm of skin: Secondary | ICD-10-CM

## 2017-09-04 DIAGNOSIS — G4733 Obstructive sleep apnea (adult) (pediatric): Secondary | ICD-10-CM | POA: Diagnosis not present

## 2017-09-04 DIAGNOSIS — Z9989 Dependence on other enabling machines and devices: Secondary | ICD-10-CM | POA: Diagnosis not present

## 2017-09-04 DIAGNOSIS — E785 Hyperlipidemia, unspecified: Secondary | ICD-10-CM

## 2017-09-04 DIAGNOSIS — R740 Nonspecific elevation of levels of transaminase and lactic acid dehydrogenase [LDH]: Secondary | ICD-10-CM

## 2017-09-04 DIAGNOSIS — M159 Polyosteoarthritis, unspecified: Secondary | ICD-10-CM

## 2017-09-04 DIAGNOSIS — I6523 Occlusion and stenosis of bilateral carotid arteries: Secondary | ICD-10-CM | POA: Diagnosis not present

## 2017-09-04 DIAGNOSIS — E66811 Obesity, class 1: Secondary | ICD-10-CM

## 2017-09-04 DIAGNOSIS — M150 Primary generalized (osteo)arthritis: Secondary | ICD-10-CM | POA: Diagnosis not present

## 2017-09-04 DIAGNOSIS — R7401 Elevation of levels of liver transaminase levels: Secondary | ICD-10-CM

## 2017-09-04 HISTORY — DX: Hyperlipidemia, unspecified: E78.5

## 2017-09-04 NOTE — Assessment & Plan Note (Signed)
Has established with neurology [name]) - notes significant benefit from CPAP use. Continue.

## 2017-09-04 NOTE — Assessment & Plan Note (Addendum)
Reviewed latest ultrasound 08/2017 - rpt recommended in 12 months.

## 2017-09-04 NOTE — Patient Instructions (Addendum)
Decrease toprol XL to 25mg  daily (1/2 tablet daily). Let me know how you feel on lower dose - this may be contributing to fatigue.  If interested, check with pharmacy about new 2 shot shingles series (shingrix).  Work on setting up advanced directive. Packet provided today.  We will refer you to [facility].  Check into  eyecare [phone] Good to see you, call us with questions. Return as needed or in 6 months for follow up visit.

## 2017-09-04 NOTE — Assessment & Plan Note (Signed)
Congratulated with ongoing weight loss, encouraged healthy diet and lifestyle changes

## 2017-09-04 NOTE — Assessment & Plan Note (Addendum)
Chronic, stable.  Bradycardia noted, endorsing increasing fatigue - will decrease metoprolol XL to 25mg  daily - update with effect after trial of lower dose for next few weeks.

## 2017-09-04 NOTE — Assessment & Plan Note (Signed)
Advanced directive discussion - does not have. Would want wife to be HCPOA. Packet provided today.

## 2017-09-04 NOTE — Assessment & Plan Note (Addendum)
Chronic, remains mildly elevated - continue milk thistle.  H/o significant alcohol use - encouraged continued moderation. Consider abd [place].

## 2017-09-04 NOTE — Progress Notes (Signed)
I reviewed health advisor's note, was available for consultation, and agree with documentation and plan.  

## 2017-09-04 NOTE — Progress Notes (Signed)
BP 140/62 (BP Location: Right Arm, Cuff Size: Normal)   Pulse (!) 56   Temp 97.8 F (36.6 C) (Oral)   Ht 5' 8.25" (1.734 m)   Wt 232 lb (105.2 kg)   SpO2 96%   BMI 35.02 kg/m    CC: AMW f/u visit Subjective:    Patient ID: [name], male    DOB: 05/25/1931, 84 y.o.   MRN: [id number]  HPI: [name] is a 84 y.o. male presenting on 09/04/2017 for Annual Exam (Pt 2. Wants to discuss taking 'baby' aspirin. Wants recommendation for dermatology and ophthalmology. C/o fatigue.)   Saw Lesia last week for medicare wellness visit. Note reviewed.   Notes increasing fatigue. He has to take nap in the afternoons.  Moved from [facility].   Sees neurologist for sleep apnea - last week's note reviewed. Good compliance. Pressures increased to 11cm. Has noticed marked difference in energy levels and daytime somnolence since he started CPAP.   Would like referral to new dermatologist. Prior saw [facility].   Preventative: Colon cancer screening - normal colonoscopies, last 10 yrs ago (~2009) Prostate cancer screening - age out Lung cancer screening - not eligible Flu shot - yearly Tdap 2014 Pneumovax 2012, prevnar 2016 zostavax 2009 shingrix - discussed Advanced directive discussion - does not have. Would want wife to be HCPOA. Packet provided today.  Seat belt use discussed Sunscreen use and skin screen discussed  Non smoker Alcohol - was heavy drinker, now drinking wine a few times a week  Widower. 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 Interim medical history since our last visit reviewed. Allergies and medications reviewed and updated. Outpatient Medications Prior to Visit  Medication Sig Dispense Refill  .  allopurinol (ZYLOPRIM) 300 MG tablet TAKE 1 TABLET BY MOUTH DAILY 90 tablet 0  . aspirin EC 81 MG tablet Take 81 mg by mouth daily.    Marland Kitchen esomeprazole (NEXIUM) 20 MG capsule Take 20 mg by mouth daily at 12 noon.    Marland Kitchen GLUCOSAMINE-CHONDROITIN-MSM PO Take 2 tablets by mouth daily.     . hydrochlorothiazide (MICROZIDE) 12.5 MG capsule TAKE 1 CAPSULE(12.5 MG) BY MOUTH DAILY 90 capsule 0  . Lactobacillus (ACIDOPHILUS PO) Take 1 capsule by mouth as needed.    . metoprolol succinate (TOPROL-XL) 50 MG 24 hr tablet Take 1 tablet (50 mg total) by mouth daily. Take with or immediately following a meal.    . Milk Thistle 1000 MG CAPS Take 1 capsule by mouth daily.    . Multiple Vitamins-Minerals (MULTIVITAMIN ADULT PO) Take 1 tablet by mouth daily.    . polyethylene glycol (MIRALAX / GLYCOLAX) packet Take 17 g by mouth daily.    . metoprolol succinate (TOPROL-XL) 50 MG 24 hr tablet TAKE 1 TABLET BY MOUTH EVERY DAY WITH OR IMMEDIATELY FOLLOWING A MEAL 90 tablet 0   No facility-administered medications prior to visit.      Per HPI unless specifically indicated in ROS section below Review of Systems     Objective:    BP 140/62 (BP Location: Right Arm, Cuff Size: Normal)   Pulse (!) 56   Temp 97.8 F (36.6 C) (Oral)   Ht 5' 8.25" (1.734 m)   Wt 232 lb (105.2 kg)  SpO2 96%   BMI 35.02 kg/m   Wt Readings from Last 3 Encounters:  09/04/17 232 lb (105.2 kg)  08/29/17 235 lb (106.6 kg)  08/28/17 236 lb 12 oz (107.4 kg)    Physical Exam  Constitutional: He is oriented to person, place, and time. He appears well-developed and well-nourished. No distress.  HENT:  Head: Normocephalic and atraumatic.  Right Ear: Hearing, tympanic membrane, external ear and ear canal normal.  Left Ear: Hearing, tympanic membrane, external ear and ear canal normal.  Nose: Nose normal.  Mouth/Throat: Uvula is midline, oropharynx is clear and moist and mucous membranes are normal. No oropharyngeal exudate, posterior  oropharyngeal edema or posterior oropharyngeal erythema.  Eyes: Pupils are equal, round, and reactive to light. Conjunctivae and EOM are normal. No scleral icterus.  Neck: Normal range of motion. Neck supple. Carotid bruit is present (bilateral mild). No thyromegaly present.  Cardiovascular: Normal rate, regular rhythm, normal heart sounds and intact distal pulses.  No murmur heard. Pulses:      Radial pulses are 2+ on the right side, and 2+ on the left side.  Pulmonary/Chest: Effort normal and breath sounds normal. No respiratory distress. He has no wheezes. He has no rales.  Abdominal: Soft. Bowel sounds are normal. He exhibits no distension and no mass. There is no tenderness. There is no rebound and no guarding.  Musculoskeletal: Normal range of motion. He exhibits no edema.  Lymphadenopathy:    He has no cervical adenopathy.  Neurological: He is alert and oriented to person, place, and time.  CN grossly intact, station and gait intact  Skin: Skin is warm and dry. No rash noted.  Psychiatric: He has a normal mood and affect. His behavior is normal. Judgment and thought content normal.  Nursing note and vitals reviewed.  Results for orders placed or performed in visit on 08/27/17  Microalbumin/Creatinine Ratio, Urine  Result Value Ref Range   Microalb, Ur 3.3 (H) 0.0 - 1.9 mg/dL   Creatinine,U 168.8 mg/dL   Microalb Creat Ratio 2.0 0.0 - 30.0 mg/g  Uric acid  Result Value Ref Range   Uric Acid, Serum 4.6 4.0 - 7.8 mg/dL  Comprehensive metabolic panel  Result Value Ref Range   Sodium 143 135 - 145 mEq/L   Potassium 4.5 3.5 - 5.1 mEq/L   Chloride 106 96 - 112 mEq/L   CO2 28 19 - 32 mEq/L   Glucose, Bld 102 (H) 70 - 99 mg/dL   BUN 26 (H) 6 - 23 mg/dL   Creatinine, Ser 1.14 0.40 - 1.50 mg/dL   Total Bilirubin 0.6 0.2 - 1.2 mg/dL   Alkaline Phosphatase 50 39 - 117 U/L   AST 48 (H) 0 - 37 U/L   ALT 55 (H) 0 - 53 U/L   Total Protein 7.1 6.0 - 8.3 g/dL   Albumin 4.2 3.5 - 5.2 g/dL    Calcium 9.5 8.4 - 10.5 mg/dL   GFR 65.10 >60.00 mL/min  Lipid Panel  Result Value Ref Range   Cholesterol 141 0 - 200 mg/dL   Triglycerides 159.0 (H) 0.0 - 149.0 mg/dL   HDL 39.00 (L) >39.00 mg/dL   VLDL 31.8 0.0 - 40.0 mg/dL   LDL Cholesterol 71 0 - 99 mg/dL   Total CHOL/HDL Ratio 4    NonHDL 102.35       Assessment & Plan:  Derm referral placed per pt request to [facility] Requests recommendations for ophtho - suggested [facility].  Problem List Items  Addressed This Visit    Advanced care planning/counseling discussion - Primary    Advanced directive discussion - does not have. Would want wife to be HCPOA. Packet provided today.       Carotid stenosis, asymptomatic, bilateral    Reviewed latest ultrasound 08/2017 - rpt recommended in 12 months.      Relevant Medications   metoprolol succinate (TOPROL-XL) 50 MG 24 hr tablet   Dyslipidemia    Chronic, off statin. Reviewed pros/cons of statin - will defer at this time.  The ASCVD Risk score [name] DC Jr., et al., 2013) failed to calculate for the following reasons:   The 2013 ASCVD risk score is only valid for ages 62 to 34       Gout    Chronic, urate stable on allopurinol 300mg  daily without recent gout flare.       HTN (hypertension)    Chronic, stable.  Bradycardia noted, endorsing increasing fatigue - will decrease metoprolol XL to 25mg  daily - update with effect after trial of lower dose for next few weeks.       Relevant Medications   metoprolol succinate (TOPROL-XL) 50 MG 24 hr tablet   Obesity, Class I, BMI 30.0-34.9 (see actual BMI)    Congratulated with ongoing weight loss, encouraged healthy diet and lifestyle changes      OSA on CPAP    Has established with neurology Rexene Alberts) - notes significant benefit from CPAP use. Continue.       Osteoarthritis    Ongoing aches, takes glucosamine daily.       Transaminitis    Chronic, remains mildly elevated - continue milk thistle.  H/o  significant alcohol use - encouraged continued moderation. Consider abd [place].       Other Visit Diagnoses    Skin exam, screening for cancer       Relevant Orders   Ambulatory referral to Dermatology       No orders of the defined types were placed in this encounter.  Orders Placed This Encounter  Procedures  . Ambulatory referral to Dermatology    Referral Priority:   Routine    Referral Type:   Consultation    Referral Reason:   Specialty Services Required    Requested Specialty:   Dermatology    Number of Visits Requested:   1    Follow up plan: Return in about 6 months (around 03/06/2018) for follow up visit.  Ria Bush, MD

## 2017-09-04 NOTE — Assessment & Plan Note (Addendum)
Chronic, off statin. Reviewed pros/cons of statin - will defer at this time.  The ASCVD Risk score [name] DC Jr., et al., 2013) failed to calculate for the following reasons:   The 2013 ASCVD risk score is only valid for ages 44 to 47

## 2017-09-04 NOTE — Assessment & Plan Note (Signed)
Ongoing aches, takes glucosamine daily.

## 2017-09-04 NOTE — Assessment & Plan Note (Signed)
Chronic, urate stable on allopurinol 300mg  daily without recent gout flare.

## 2017-09-05 DIAGNOSIS — M5416 Radiculopathy, lumbar region: Secondary | ICD-10-CM | POA: Diagnosis not present

## 2017-09-05 DIAGNOSIS — M955 Acquired deformity of pelvis: Secondary | ICD-10-CM | POA: Diagnosis not present

## 2017-09-05 DIAGNOSIS — M9903 Segmental and somatic dysfunction of lumbar region: Secondary | ICD-10-CM | POA: Diagnosis not present

## 2017-09-05 DIAGNOSIS — M9905 Segmental and somatic dysfunction of pelvic region: Secondary | ICD-10-CM | POA: Diagnosis not present

## 2017-09-06 ENCOUNTER — Telehealth: Payer: Self-pay

## 2017-09-06 NOTE — Telephone Encounter (Signed)
Left message for patient to call [name] back in regards to a referral-[name], RMA   

## 2017-09-10 DIAGNOSIS — M9905 Segmental and somatic dysfunction of pelvic region: Secondary | ICD-10-CM | POA: Diagnosis not present

## 2017-09-10 DIAGNOSIS — M5416 Radiculopathy, lumbar region: Secondary | ICD-10-CM | POA: Diagnosis not present

## 2017-09-10 DIAGNOSIS — M955 Acquired deformity of pelvis: Secondary | ICD-10-CM | POA: Diagnosis not present

## 2017-09-10 DIAGNOSIS — M9903 Segmental and somatic dysfunction of lumbar region: Secondary | ICD-10-CM | POA: Diagnosis not present

## 2017-09-17 DIAGNOSIS — M9903 Segmental and somatic dysfunction of lumbar region: Secondary | ICD-10-CM | POA: Diagnosis not present

## 2017-09-17 DIAGNOSIS — M5416 Radiculopathy, lumbar region: Secondary | ICD-10-CM | POA: Diagnosis not present

## 2017-09-17 DIAGNOSIS — M9905 Segmental and somatic dysfunction of pelvic region: Secondary | ICD-10-CM | POA: Diagnosis not present

## 2017-09-17 DIAGNOSIS — M955 Acquired deformity of pelvis: Secondary | ICD-10-CM | POA: Diagnosis not present

## 2017-10-10 DIAGNOSIS — L820 Inflamed seborrheic keratosis: Secondary | ICD-10-CM | POA: Diagnosis not present

## 2017-10-10 DIAGNOSIS — L821 Other seborrheic keratosis: Secondary | ICD-10-CM | POA: Diagnosis not present

## 2017-10-10 DIAGNOSIS — Z1283 Encounter for screening for malignant neoplasm of skin: Secondary | ICD-10-CM | POA: Diagnosis not present

## 2017-10-10 DIAGNOSIS — L570 Actinic keratosis: Secondary | ICD-10-CM | POA: Diagnosis not present

## 2017-10-10 DIAGNOSIS — D692 Other nonthrombocytopenic purpura: Secondary | ICD-10-CM | POA: Diagnosis not present

## 2017-10-10 DIAGNOSIS — L578 Other skin changes due to chronic exposure to nonionizing radiation: Secondary | ICD-10-CM | POA: Diagnosis not present

## 2017-10-10 DIAGNOSIS — L812 Freckles: Secondary | ICD-10-CM | POA: Diagnosis not present

## 2017-10-22 DIAGNOSIS — M955 Acquired deformity of pelvis: Secondary | ICD-10-CM | POA: Diagnosis not present

## 2017-10-22 DIAGNOSIS — M5416 Radiculopathy, lumbar region: Secondary | ICD-10-CM | POA: Diagnosis not present

## 2017-10-22 DIAGNOSIS — M9903 Segmental and somatic dysfunction of lumbar region: Secondary | ICD-10-CM | POA: Diagnosis not present

## 2017-10-22 DIAGNOSIS — M9905 Segmental and somatic dysfunction of pelvic region: Secondary | ICD-10-CM | POA: Diagnosis not present

## 2017-10-29 ENCOUNTER — Other Ambulatory Visit: Payer: Self-pay | Admitting: Family Medicine

## 2017-10-30 DIAGNOSIS — M9905 Segmental and somatic dysfunction of pelvic region: Secondary | ICD-10-CM | POA: Diagnosis not present

## 2017-10-30 DIAGNOSIS — M955 Acquired deformity of pelvis: Secondary | ICD-10-CM | POA: Diagnosis not present

## 2017-10-30 DIAGNOSIS — M9903 Segmental and somatic dysfunction of lumbar region: Secondary | ICD-10-CM | POA: Diagnosis not present

## 2017-10-30 DIAGNOSIS — M5416 Radiculopathy, lumbar region: Secondary | ICD-10-CM | POA: Diagnosis not present

## 2017-11-14 ENCOUNTER — Ambulatory Visit (INDEPENDENT_AMBULATORY_CARE_PROVIDER_SITE_OTHER): Payer: Medicare Other | Admitting: Family Medicine

## 2017-11-14 ENCOUNTER — Encounter: Payer: Self-pay | Admitting: Family Medicine

## 2017-11-14 VITALS — BP 136/68 | HR 70 | Temp 97.8°F | Ht 68.25 in | Wt 231.5 lb

## 2017-11-14 DIAGNOSIS — I6523 Occlusion and stenosis of bilateral carotid arteries: Secondary | ICD-10-CM

## 2017-11-14 DIAGNOSIS — R0981 Nasal congestion: Secondary | ICD-10-CM | POA: Insufficient documentation

## 2017-11-14 MED ORDER — PREDNISONE 20 MG PO TABS: ORAL_TABLET | ORAL | 0 refills | Status: DC

## 2017-11-14 NOTE — Progress Notes (Signed)
BP 136/68 (BP Location: Left Arm, Patient Position: Sitting, Cuff Size: Normal)   Pulse 70   Temp 97.8 F (36.6 C) (Oral)   Ht 5' 8.25" (1.734 m)   Wt 231 lb 8 oz (105 kg)   SpO2 96%   BMI 34.94 kg/m    CC: sinus trouble Subjective:    Patient ID: [name], male    DOB: May 15, 1931, 84 y.o.   MRN: [id number]  HPI: [name] is a 84 y.o. male presenting on 11/14/2017 for Sinus Problem (Sinus drainage and cough started 3 wks ago. Tried Claritin and Zyrtec, barely helpful. )   3 wk h/o sinus congestion, productive cough of clear mucous, PNDrainage. Some hoarseness. Today feels better.  Symptoms started after he was outdoors for a long time while on recent trip to [place].   No fevers/chills, ST, ear or tooth pain, sinus pressure. Denies dyspnea or wheezing.   No h/o asthma.  Known allergic rhinitis alternates allegra and claritin.  No sick contacts at home. Non smoker.   Relevant past medical, surgical, family and social history reviewed and updated as indicated. Interim medical history since our last visit reviewed. Allergies and medications reviewed and updated. Outpatient Medications Prior to Visit  Medication Sig Dispense Refill  . allopurinol (ZYLOPRIM) 300 MG tablet TAKE 1 TABLET BY MOUTH DAILY 90 tablet 3  . aspirin EC 81 MG tablet Take 81 mg by mouth daily.    Marland Kitchen esomeprazole (NEXIUM) 20 MG capsule Take 20 mg by mouth daily at 12 noon.    Marland Kitchen GLUCOSAMINE-CHONDROITIN-MSM PO Take 2 tablets by mouth daily.     . hydrochlorothiazide (MICROZIDE) 12.5 MG capsule TAKE 1 CAPSULE(12.5 MG) BY MOUTH DAILY 90 capsule 0  . Lactobacillus (ACIDOPHILUS PO) Take 1 capsule by mouth as needed.    . metoprolol succinate (TOPROL-XL) 50 MG 24 hr tablet Take 1 tablet (50 mg total) by mouth daily. Take with or immediately following a meal.    . Milk Thistle 1000 MG CAPS Take 1 capsule by mouth daily.    . Multiple Vitamins-Minerals (MULTIVITAMIN ADULT PO) Take 1 tablet by mouth daily.     . polyethylene glycol (MIRALAX / GLYCOLAX) packet Take 17 g by mouth daily.     No facility-administered medications prior to visit.      Per HPI unless specifically indicated in ROS section below Review of Systems     Objective:    BP 136/68 (BP Location: Left Arm, Patient Position: Sitting, Cuff Size: Normal)   Pulse 70   Temp 97.8 F (36.6 C) (Oral)   Ht 5' 8.25" (1.734 m)   Wt 231 lb 8 oz (105 kg)   SpO2 96%   BMI 34.94 kg/m   Wt Readings from Last 3 Encounters:  11/14/17 231 lb 8 oz (105 kg)  09/04/17 232 lb (105.2 kg)  08/29/17 235 lb (106.6 kg)    Physical Exam  Constitutional: He appears well-developed and well-nourished. No distress.  HENT:  Head: Normocephalic and atraumatic.  Right Ear: Hearing, tympanic membrane, external ear and ear canal normal.  Left Ear: Hearing, tympanic membrane, external ear and ear canal normal.  Nose: Rhinorrhea present. No mucosal edema. Right sinus exhibits no maxillary sinus tenderness and no frontal sinus tenderness. Left sinus exhibits no maxillary sinus tenderness and no frontal sinus tenderness.  Mouth/Throat: Oropharynx is clear and moist and mucous membranes are normal.  Difficult to see oropharynx  Eyes: Pupils are equal, round, and reactive to light. Conjunctivae and EOM  are normal. No scleral icterus.  Neck: Normal range of motion. Neck supple.  Cardiovascular: Normal rate, regular rhythm, normal heart sounds and intact distal pulses.  No murmur heard. Pulmonary/Chest: Effort normal and breath sounds normal. No respiratory distress. He has no wheezes. He has no rales.  Lungs clear  Lymphadenopathy:    He has no cervical adenopathy.  Skin: Skin is warm and dry. No rash noted.  Nursing note and vitals reviewed.     Assessment & Plan:   Problem List Items Addressed This Visit    Nasal sinus congestion - Primary    Anticipate inflammation from allergies, no current signs of bacterial infection. Treat with prednisone  course. Red flags to update [place] for abx course discussed. Pt agrees with plan.           Meds ordered this encounter  Medications  . predniSONE (DELTASONE) 20 MG tablet    Sig: Take two tablets daily for 3 days followed by one tablet daily for 4 days    Dispense:  10 tablet    Refill:  0   No orders of the defined types were placed in this encounter.   Follow up plan: Return if symptoms worsen or fail to improve.  Ria Bush, MD

## 2017-11-14 NOTE — Assessment & Plan Note (Signed)
Anticipate inflammation from allergies, no current signs of bacterial infection. Treat with prednisone course. Red flags to update [place] for abx course discussed. Pt agrees with plan.

## 2017-11-14 NOTE — Patient Instructions (Addendum)
I think you have sinus inflammation, but not bacterial infection.  Treat with short prednisone course Continue alternating antihistamines. Push fluids and rest.  Nasal saline irrigation.  Let us know if worsening productive cough, fevers >101 or worsening sinus pressure headache or other signs of bacterial infection.

## 2017-11-21 ENCOUNTER — Other Ambulatory Visit: Payer: Self-pay | Admitting: Family Medicine

## 2017-11-26 DIAGNOSIS — M9905 Segmental and somatic dysfunction of pelvic region: Secondary | ICD-10-CM | POA: Diagnosis not present

## 2017-11-26 DIAGNOSIS — M9903 Segmental and somatic dysfunction of lumbar region: Secondary | ICD-10-CM | POA: Diagnosis not present

## 2017-11-26 DIAGNOSIS — M955 Acquired deformity of pelvis: Secondary | ICD-10-CM | POA: Diagnosis not present

## 2017-11-26 DIAGNOSIS — M5416 Radiculopathy, lumbar region: Secondary | ICD-10-CM | POA: Diagnosis not present

## 2018-02-11 DIAGNOSIS — M9905 Segmental and somatic dysfunction of pelvic region: Secondary | ICD-10-CM | POA: Diagnosis not present

## 2018-02-11 DIAGNOSIS — M5416 Radiculopathy, lumbar region: Secondary | ICD-10-CM | POA: Diagnosis not present

## 2018-02-11 DIAGNOSIS — M955 Acquired deformity of pelvis: Secondary | ICD-10-CM | POA: Diagnosis not present

## 2018-02-11 DIAGNOSIS — M9903 Segmental and somatic dysfunction of lumbar region: Secondary | ICD-10-CM | POA: Diagnosis not present

## 2018-02-11 DIAGNOSIS — Z23 Encounter for immunization: Secondary | ICD-10-CM | POA: Diagnosis not present

## 2018-03-25 DIAGNOSIS — M955 Acquired deformity of pelvis: Secondary | ICD-10-CM | POA: Diagnosis not present

## 2018-03-25 DIAGNOSIS — M5416 Radiculopathy, lumbar region: Secondary | ICD-10-CM | POA: Diagnosis not present

## 2018-03-25 DIAGNOSIS — M9903 Segmental and somatic dysfunction of lumbar region: Secondary | ICD-10-CM | POA: Diagnosis not present

## 2018-03-25 DIAGNOSIS — M9905 Segmental and somatic dysfunction of pelvic region: Secondary | ICD-10-CM | POA: Diagnosis not present

## 2018-04-19 ENCOUNTER — Other Ambulatory Visit: Payer: Self-pay | Admitting: Family Medicine

## 2018-04-23 ENCOUNTER — Telehealth: Payer: Self-pay

## 2018-04-23 DIAGNOSIS — M5416 Radiculopathy, lumbar region: Secondary | ICD-10-CM | POA: Diagnosis not present

## 2018-04-23 DIAGNOSIS — M9903 Segmental and somatic dysfunction of lumbar region: Secondary | ICD-10-CM | POA: Diagnosis not present

## 2018-04-23 DIAGNOSIS — M955 Acquired deformity of pelvis: Secondary | ICD-10-CM | POA: Diagnosis not present

## 2018-04-23 DIAGNOSIS — M9905 Segmental and somatic dysfunction of pelvic region: Secondary | ICD-10-CM | POA: Diagnosis not present

## 2018-04-23 MED ORDER — METOPROLOL SUCCINATE ER 25 MG PO TB24: 25.0000 mg | ORAL_TABLET | Freq: Every day | ORAL | 1 refills | Status: DC

## 2018-04-23 NOTE — Telephone Encounter (Signed)
Need more info I've sent in 25mg  dose - but how is pulse doing, how is blood pressure doing? Need to ensure ok to continue at lower dose.

## 2018-04-23 NOTE — Telephone Encounter (Signed)
Pt's wife, Vaughan Basta (on dpr), is here for OV today.  States pt needs refill for metoprolol.    Per 09/04/17 OV, pt was to decrease to 25 mg and let Dr. Darnell Level know how that worked.  Is ok to sent new rx for metoprolol 25 mg 24 hr cap?

## 2018-04-25 NOTE — Telephone Encounter (Signed)
Spoke with pt asking Dr. Synthia Innocent questions.  Pt states his HR and BP have been good, as far as he knows. Does not seem to be having any issues.  Notified him Dr. Darnell Level sent refill to pharmacy.  Pt verbalizes understanding. Fyi to Dr. Darnell Level.

## 2018-05-16 DIAGNOSIS — D692 Other nonthrombocytopenic purpura: Secondary | ICD-10-CM | POA: Diagnosis not present

## 2018-05-16 DIAGNOSIS — L812 Freckles: Secondary | ICD-10-CM | POA: Diagnosis not present

## 2018-05-16 DIAGNOSIS — L570 Actinic keratosis: Secondary | ICD-10-CM | POA: Diagnosis not present

## 2018-05-16 DIAGNOSIS — D2260 Melanocytic nevi of unspecified upper limb, including shoulder: Secondary | ICD-10-CM | POA: Diagnosis not present

## 2018-05-16 DIAGNOSIS — L578 Other skin changes due to chronic exposure to nonionizing radiation: Secondary | ICD-10-CM | POA: Diagnosis not present

## 2018-05-16 DIAGNOSIS — D2230 Melanocytic nevi of unspecified part of face: Secondary | ICD-10-CM | POA: Diagnosis not present

## 2018-05-21 DIAGNOSIS — M955 Acquired deformity of pelvis: Secondary | ICD-10-CM | POA: Diagnosis not present

## 2018-05-21 DIAGNOSIS — M9903 Segmental and somatic dysfunction of lumbar region: Secondary | ICD-10-CM | POA: Diagnosis not present

## 2018-05-21 DIAGNOSIS — M9905 Segmental and somatic dysfunction of pelvic region: Secondary | ICD-10-CM | POA: Diagnosis not present

## 2018-05-21 DIAGNOSIS — M5416 Radiculopathy, lumbar region: Secondary | ICD-10-CM | POA: Diagnosis not present

## 2018-06-04 DIAGNOSIS — M5416 Radiculopathy, lumbar region: Secondary | ICD-10-CM | POA: Diagnosis not present

## 2018-06-04 DIAGNOSIS — M9903 Segmental and somatic dysfunction of lumbar region: Secondary | ICD-10-CM | POA: Diagnosis not present

## 2018-06-04 DIAGNOSIS — M9905 Segmental and somatic dysfunction of pelvic region: Secondary | ICD-10-CM | POA: Diagnosis not present

## 2018-06-04 DIAGNOSIS — M955 Acquired deformity of pelvis: Secondary | ICD-10-CM | POA: Diagnosis not present

## 2018-06-18 DIAGNOSIS — M9903 Segmental and somatic dysfunction of lumbar region: Secondary | ICD-10-CM | POA: Diagnosis not present

## 2018-06-18 DIAGNOSIS — M5416 Radiculopathy, lumbar region: Secondary | ICD-10-CM | POA: Diagnosis not present

## 2018-06-18 DIAGNOSIS — M9905 Segmental and somatic dysfunction of pelvic region: Secondary | ICD-10-CM | POA: Diagnosis not present

## 2018-06-18 DIAGNOSIS — M955 Acquired deformity of pelvis: Secondary | ICD-10-CM | POA: Diagnosis not present

## 2018-07-02 DIAGNOSIS — M9903 Segmental and somatic dysfunction of lumbar region: Secondary | ICD-10-CM | POA: Diagnosis not present

## 2018-07-02 DIAGNOSIS — M9905 Segmental and somatic dysfunction of pelvic region: Secondary | ICD-10-CM | POA: Diagnosis not present

## 2018-07-02 DIAGNOSIS — M5416 Radiculopathy, lumbar region: Secondary | ICD-10-CM | POA: Diagnosis not present

## 2018-07-02 DIAGNOSIS — M955 Acquired deformity of pelvis: Secondary | ICD-10-CM | POA: Diagnosis not present

## 2018-07-16 DIAGNOSIS — M955 Acquired deformity of pelvis: Secondary | ICD-10-CM | POA: Diagnosis not present

## 2018-07-16 DIAGNOSIS — M9903 Segmental and somatic dysfunction of lumbar region: Secondary | ICD-10-CM | POA: Diagnosis not present

## 2018-07-16 DIAGNOSIS — M5416 Radiculopathy, lumbar region: Secondary | ICD-10-CM | POA: Diagnosis not present

## 2018-07-16 DIAGNOSIS — M9905 Segmental and somatic dysfunction of pelvic region: Secondary | ICD-10-CM | POA: Diagnosis not present

## 2018-08-19 ENCOUNTER — Telehealth: Payer: Self-pay | Admitting: *Deleted

## 2018-08-19 NOTE — Telephone Encounter (Signed)
Attempted to call pt.  No answer.  Attempting to convert appt to VV or Telephone visit.

## 2018-08-28 ENCOUNTER — Telehealth: Payer: Self-pay | Admitting: Family Medicine

## 2018-08-28 ENCOUNTER — Encounter: Payer: Self-pay | Admitting: *Deleted

## 2018-08-28 NOTE — Telephone Encounter (Signed)
Appts are now cancelled.

## 2018-08-28 NOTE — Telephone Encounter (Signed)
Returned pt's call.  Says he now wants to c/x 5/1 and 5/4 visits because he is going to the beach.  Pt will call back to r/s appts.

## 2018-08-28 NOTE — Telephone Encounter (Signed)
I called pt and relayed need to r/s appt due to mm/np on maternity leave.  Make virtual visit due to current COVID 19 pandemic, our office is severely reducing in office visits for at least the next 2 weeks, in order to minimize the risk to our patients and healthcare providers.  He CONSENTED.   Pt understands that although there may be some limitations with this type of visit, we will take all precautions to reduce any security or privacy concerns.  Pt understands that this will be treated like an in office visit and we will file with pt's insurance. Pt's email is [email or web address]. Pt understands that the [employer] webex software must be downloaded and operational on the device pt plans to use for the visit.  Chart updated.

## 2018-08-28 NOTE — Telephone Encounter (Signed)
Best number [phone]  Pt has labs on 5/1 and  Virtual medicare wellness with lisa and follow up with you on 5/4  Pt wants to know if he can his bp check when he has his labs done on 5/1

## 2018-09-02 ENCOUNTER — Other Ambulatory Visit: Payer: Self-pay

## 2018-09-02 ENCOUNTER — Encounter: Payer: Self-pay | Admitting: Family Medicine

## 2018-09-02 ENCOUNTER — Ambulatory Visit (INDEPENDENT_AMBULATORY_CARE_PROVIDER_SITE_OTHER): Payer: Medicare Other | Admitting: Family Medicine

## 2018-09-02 ENCOUNTER — Telehealth: Payer: Self-pay | Admitting: Family Medicine

## 2018-09-02 DIAGNOSIS — G4733 Obstructive sleep apnea (adult) (pediatric): Secondary | ICD-10-CM

## 2018-09-02 DIAGNOSIS — Z9989 Dependence on other enabling machines and devices: Secondary | ICD-10-CM

## 2018-09-02 DIAGNOSIS — L820 Inflamed seborrheic keratosis: Secondary | ICD-10-CM | POA: Diagnosis not present

## 2018-09-02 DIAGNOSIS — L578 Other skin changes due to chronic exposure to nonionizing radiation: Secondary | ICD-10-CM | POA: Diagnosis not present

## 2018-09-02 DIAGNOSIS — M9905 Segmental and somatic dysfunction of pelvic region: Secondary | ICD-10-CM | POA: Diagnosis not present

## 2018-09-02 DIAGNOSIS — L821 Other seborrheic keratosis: Secondary | ICD-10-CM | POA: Diagnosis not present

## 2018-09-02 DIAGNOSIS — M5416 Radiculopathy, lumbar region: Secondary | ICD-10-CM | POA: Diagnosis not present

## 2018-09-02 DIAGNOSIS — L570 Actinic keratosis: Secondary | ICD-10-CM | POA: Diagnosis not present

## 2018-09-02 DIAGNOSIS — M955 Acquired deformity of pelvis: Secondary | ICD-10-CM | POA: Diagnosis not present

## 2018-09-02 DIAGNOSIS — M9903 Segmental and somatic dysfunction of lumbar region: Secondary | ICD-10-CM | POA: Diagnosis not present

## 2018-09-02 DIAGNOSIS — D692 Other nonthrombocytopenic purpura: Secondary | ICD-10-CM | POA: Diagnosis not present

## 2018-09-02 NOTE — Telephone Encounter (Signed)
Please call pt back and offer him an in-office visit with Amy, NP for June. We are unlikely to be seeing pts in the office until at least June. If pt is doing well on his cpap with no questions, a delay in his appt should be ok.

## 2018-09-02 NOTE — Telephone Encounter (Signed)
Patient does not want to do a virtual visit. States he is doing fine. He would like a call back regarding getting rescheduled and possibly taking his wife or daughter's apt with Dr. Rexene Alberts on 5/19 instead. Best call back is [phone]

## 2018-09-02 NOTE — Telephone Encounter (Signed)
LVM for the patient to return my call. Office number was provided.

## 2018-09-02 NOTE — Progress Notes (Addendum)
PATIENT: [name] DOB: [birth year]  REASON FOR VISIT: follow up HISTORY FROM: patient  Virtual Visit via Telephone Note  I connected with [name] on 09/02/18 at  1:00 PM EDT by telephone and verified that I am speaking with the correct person using two identifiers.   I discussed the limitations, risks, security and privacy concerns of performing an evaluation and management service by telephone and the availability of in person appointments. I also discussed with the patient that there may be a patient responsible charge related to this service. The patient expressed understanding and agreed to proceed.   History of Present Illness:  09/02/18 [name] is a 85 y.o. male for follow up of OSA on CPAP.  He is doing very well.  He reports nightly use.  At last visit with [facility] in April, 2019 he was having some concerns of waking during the night and increased snoring.  Pressure was increased to 11 cm of water.  He states that he is waking up once per night to rollover.  His wife no longer notices him snoring.  Compliance data as follows:  08/03/2018 - 09/01/2018 - 09/01/2018 Usage days 30/30 days (100%) >= 4 hours 30 days (100%) < 4 hours 0 days (0%) Usage hours 231 hours 42 minutes Average usage (total days) 7 hours 43 minutes Average usage (days used) 7 hours 43 minutes Median usage (days used) 7 hours 54 minutes Total used hours (value since last reset - 09/01/2018) 5,628 hours AirSense 10 AutoSet Serial number [id number] Mode CPAP Set pressure 10 cmH2O EPR Fulltime EPR level 1 Therapy Leaks - L/min Median: 8.2 95th percentile: 15.3 Maximum: 26.8 Events per hour AI: 1.3 HI: 0.9 AHI: 2.2 Apnea Index Central: 0.2 Obstructive: 1.1 Unknown: 0.0 RERA Index 0.4   HISTORY (copied from [place] note on 08/29/2017)  [name] is an 85 year old male with a history of obstructive sleep apnea on CPAP.  His download indicates that he use his machine nightly  for compliance of 100%.  Every night he uses machine greater than 4 hours.  On average he uses his machine 8 hours and 56 minutes.  His residual AHI is 2.6 on 10 cm of water with EPR of 1.  He does not have a significant leak.  He states that since he is been on 10 cm of water he feels that he wakes up more.  He also states that his wife has noted that he is snoring.  He denies any significant daytime sleepiness.  He returns today for an evaluation.  HISTORY 01/23/2017(Copied from Dr. Guadelupe Sabin note):  I reviewed his CPAP compliance data from 12/23/2016 through 01/21/2017 which is a total of 30 days, during which time he used his machine 29 days with percent used days greater than 4 hours at 97%, indicating excellent compliance with an average usage of 9 hours and 35 minutes, residual AHI at goal at 2.2 per hour, leak acceptable with the 95th percentile at 16.1 L/m on a pressure of 10 cm with EPR. He reports, he occasional wakes up with a dull headache. He limits his water intake after 8 PM. He does like to drink wine, wife gives him a hard time if he drinks more than 3 glasses. He is using a medium Eson nasal mask.Has occasional leg cramps, but tonic water helps, no actual RLS symptoms.  The patient's allergies, current medications, family history, past medical history, past social history, past surgical history and problem list were reviewed and updated as  appropriate.   Observations/Objective:  Generalized: Well developed, in no acute distress  Mentation: Alert oriented to time, place, history taking. Follows all commands speech and language fluent   Assessment and Plan:  85 y.o. year old male  has a past medical history of Carotid stenosis, asymptomatic, bilateral (09/16/2016), Diverticulitis, GERD (gastroesophageal reflux disease), Gout, History of osteomyelitis (10/2015), History of squamous cell carcinoma of skin, HTN (hypertension), OSA on CPAP (09/01/2016), Osteoarthritis, Personal history  of malignant neoplasm of larynx (2009), Seasonal allergies, and Sensorineural hearing loss (SNHL) of both ears (09/01/2016). with    ICD-10-CM   1. OSA on CPAP G47.33    Z99.89    [name] is doing very well on CPAP therapy.  He feels that he is sleeping better and states that his wife no longer notices his snoring.  He shows excellent compliance on download report.  I have encouraged him to continue using CPAP therapy nightly and for greater than 4 hours each night.  We will follow-up with him in 1 year, sooner if needed.  He verbalizes understanding and agreement with this plan.  No orders of the defined types were placed in this encounter.   No orders of the defined types were placed in this encounter.    Follow Up Instructions:  I discussed the assessment and treatment plan with the patient. The patient was provided an opportunity to ask questions and all were answered. The patient agreed with the plan and demonstrated an understanding of the instructions.   The patient was advised to call back or seek an in-person evaluation if the symptoms worsen or if the condition fails to improve as anticipated.  I provided 25 minutes of non-face-to-face time during this encounter.  Patient is located at his place of residence during teleconference.  Provider is located at her place of residence.  Liane Comber, RN helped to facilitate visit.   Debbora Presto, NP   I reviewed the above note and documentation by the Nurse Practitioner and agree with the history, assessment and plan as outlined above. I was immediately available for phone consultation. Star Age, MD, PhD [facility])

## 2018-09-04 ENCOUNTER — Ambulatory Visit: Payer: Medicare Other | Admitting: Adult Health

## 2018-09-06 ENCOUNTER — Ambulatory Visit: Payer: Medicare Other

## 2018-09-06 ENCOUNTER — Other Ambulatory Visit: Payer: Medicare Other

## 2018-09-09 ENCOUNTER — Ambulatory Visit: Payer: Medicare Other | Admitting: Family Medicine

## 2018-09-11 ENCOUNTER — Encounter: Payer: Medicare Other | Admitting: Family Medicine

## 2018-10-02 DIAGNOSIS — M955 Acquired deformity of pelvis: Secondary | ICD-10-CM | POA: Diagnosis not present

## 2018-10-02 DIAGNOSIS — M5416 Radiculopathy, lumbar region: Secondary | ICD-10-CM | POA: Diagnosis not present

## 2018-10-02 DIAGNOSIS — M9903 Segmental and somatic dysfunction of lumbar region: Secondary | ICD-10-CM | POA: Diagnosis not present

## 2018-10-02 DIAGNOSIS — M9905 Segmental and somatic dysfunction of pelvic region: Secondary | ICD-10-CM | POA: Diagnosis not present

## 2018-10-17 ENCOUNTER — Other Ambulatory Visit: Payer: Self-pay | Admitting: Family Medicine

## 2018-10-28 DIAGNOSIS — M9903 Segmental and somatic dysfunction of lumbar region: Secondary | ICD-10-CM | POA: Diagnosis not present

## 2018-10-28 DIAGNOSIS — M955 Acquired deformity of pelvis: Secondary | ICD-10-CM | POA: Diagnosis not present

## 2018-10-28 DIAGNOSIS — M9905 Segmental and somatic dysfunction of pelvic region: Secondary | ICD-10-CM | POA: Diagnosis not present

## 2018-10-28 DIAGNOSIS — M5416 Radiculopathy, lumbar region: Secondary | ICD-10-CM | POA: Diagnosis not present

## 2018-11-07 DIAGNOSIS — H5789 Other specified disorders of eye and adnexa: Secondary | ICD-10-CM | POA: Diagnosis not present

## 2018-11-25 ENCOUNTER — Other Ambulatory Visit: Payer: Self-pay | Admitting: Family Medicine

## 2018-11-25 DIAGNOSIS — M9905 Segmental and somatic dysfunction of pelvic region: Secondary | ICD-10-CM | POA: Diagnosis not present

## 2018-11-25 DIAGNOSIS — M955 Acquired deformity of pelvis: Secondary | ICD-10-CM | POA: Diagnosis not present

## 2018-11-25 DIAGNOSIS — M5416 Radiculopathy, lumbar region: Secondary | ICD-10-CM | POA: Diagnosis not present

## 2018-11-25 DIAGNOSIS — M9903 Segmental and somatic dysfunction of lumbar region: Secondary | ICD-10-CM | POA: Diagnosis not present

## 2018-12-02 ENCOUNTER — Ambulatory Visit (INDEPENDENT_AMBULATORY_CARE_PROVIDER_SITE_OTHER): Payer: Medicare Other | Admitting: Family Medicine

## 2018-12-02 ENCOUNTER — Other Ambulatory Visit: Payer: Self-pay

## 2018-12-02 ENCOUNTER — Encounter: Payer: Self-pay | Admitting: Family Medicine

## 2018-12-02 ENCOUNTER — Ambulatory Visit (INDEPENDENT_AMBULATORY_CARE_PROVIDER_SITE_OTHER)
Admission: RE | Admit: 2018-12-02 | Discharge: 2018-12-02 | Disposition: A | Discharge status: Home / Self Care | Payer: Medicare Other | Source: Ambulatory Visit | Attending: Family Medicine | Admitting: Family Medicine

## 2018-12-02 VITALS — BP 132/70 | HR 59 | Temp 98.0°F | Ht 68.25 in | Wt 229.4 lb

## 2018-12-02 DIAGNOSIS — A4902 Methicillin resistant Staphylococcus aureus infection, unspecified site: Secondary | ICD-10-CM | POA: Diagnosis not present

## 2018-12-02 DIAGNOSIS — S91109S Unspecified open wound of unspecified toe(s) without damage to nail, sequela: Secondary | ICD-10-CM

## 2018-12-02 DIAGNOSIS — L84 Corns and callosities: Secondary | ICD-10-CM | POA: Insufficient documentation

## 2018-12-02 DIAGNOSIS — B9562 Methicillin resistant Staphylococcus aureus infection as the cause of diseases classified elsewhere: Secondary | ICD-10-CM

## 2018-12-02 DIAGNOSIS — S91102A Unspecified open wound of left great toe without damage to nail, initial encounter: Secondary | ICD-10-CM | POA: Diagnosis not present

## 2018-12-02 NOTE — Progress Notes (Signed)
This visit was conducted in person.  BP 132/70 (BP Location: Right Arm, Patient Position: Sitting, Cuff Size: Normal)   Pulse (!) 59   Temp 98 F (36.7 C) (Temporal)   Ht 5' 8.25" (1.734 m)   Wt 229 lb 6 oz (104 kg)   SpO2 97%   BMI 34.62 kg/m    CC: great toe wound Subjective:    Patient ID: [name], male    DOB: 29-Jan-1932, 85 y.o.   MRN: [id number]  HPI: [name] is a 85 y.o. male presenting on 12/02/2018 for Wound (C/o "hole" in left great toe. Wife notice on 11/30/18. Denies any pain. )   Wife's been trimming callus to great toe over the last few weeks. Wife noticed lesion to L great toe 11/30/2018.   H/o MRSA infection to same great toe 3-5 yrs ago s/p hospitalization then IV abx treatment through PICC line for 7 wks.      Relevant past medical, surgical, family and social history reviewed and updated as indicated. Interim medical history since our last visit reviewed. Allergies and medications reviewed and updated. Outpatient Medications Prior to Visit  Medication Sig Dispense Refill  . allopurinol (ZYLOPRIM) 300 MG tablet TAKE 1 TABLET BY MOUTH DAILY 90 tablet 1  . aspirin EC 81 MG tablet Take 81 mg by mouth daily.    . cetirizine (ZYRTEC) 10 MG tablet Take 10 mg by mouth daily. Alternates weekly with allegra.    Marland Kitchen esomeprazole (NEXIUM) 20 MG capsule Take 20 mg by mouth daily at 12 noon.    . fexofenadine (ALLEGRA) 180 MG tablet Take 180 mg by mouth daily. Alternates weekly with zyrtec.    Marland Kitchen GLUCOSAMINE-CHONDROITIN-MSM PO Take 2 tablets by mouth daily.     . hydrochlorothiazide (MICROZIDE) 12.5 MG capsule TAKE 1 CAPSULE(12.5 MG) BY MOUTH DAILY 30 capsule 0  . Lactobacillus (ACIDOPHILUS PO) Take 1 capsule by mouth as needed.    . metoprolol succinate (TOPROL-XL) 25 MG 24 hr tablet TAKE 1 TABLET BY MOUTH DAILY. TAKE WITH OR IMMEDIATELY FOLLOWING A MEAL 90 tablet 1  . Milk Thistle 1000 MG CAPS Take 1 capsule by mouth daily.    . Multiple Vitamins-Minerals  (MULTIVITAMIN ADULT PO) Take 1 tablet by mouth daily.    . polyethylene glycol (MIRALAX / GLYCOLAX) packet Take 17 g by mouth daily.     No facility-administered medications prior to visit.      Per HPI unless specifically indicated in ROS section below Review of Systems Objective:    BP 132/70 (BP Location: Right Arm, Patient Position: Sitting, Cuff Size: Normal)   Pulse (!) 59   Temp 98 F (36.7 C) (Temporal)   Ht 5' 8.25" (1.734 m)   Wt 229 lb 6 oz (104 kg)   SpO2 97%   BMI 34.62 kg/m   Wt Readings from Last 3 Encounters:  12/02/18 229 lb 6 oz (104 kg)  11/14/17 231 lb 8 oz (105 kg)  09/04/17 232 lb (105.2 kg)    Physical Exam Vitals signs and nursing note reviewed.  Constitutional:      Appearance: Normal appearance. He is not ill-appearing.  Musculoskeletal: Normal range of motion.     Comments:  Diminished pulses bilaterally Diminished sensation to monofilament testing L>R  Skin:    General: Skin is warm and dry.     Findings: Lesion present.     Comments:  Chronic calluses bilateral great toes L>R, some bruising to great toe L great toe with chronic  appearing opening of skin without significant drainage, erythema, no pain with axial loading of toe, FROM at toe without pain  Neurological:     Mental Status: He is alert.       Assessment & Plan:   Problem List Items Addressed This Visit    Pre-ulcerative calluses - Primary    With diminished sensation and diminished pulses. Anticipate component of periph neuropathy, possible poor circulation. No signs of concomitant infection at this time. Supportive care reviewed. Check toe xray.  Advised to stop trimming calluses of toes. Offered podiatry eval - declines at this time.  Consider ABIs. H/o great toe MRSA infection years ago at [facility], s/p hospitalization and prolonged IV abx use.       Relevant Orders   DG Toe Great Left   Open toe wound, sequela   Infection of left great toe due to methicillin  resistant Staphylococcus aureus (MRSA)       No orders of the defined types were placed in this encounter.  Orders Placed This Encounter  Procedures  . DG Toe Great Left    Standing Status:   Future    Standing Expiration Date:   02/02/2020    Order Specific Question:   Reason for Exam (SYMPTOM  OR DIAGNOSIS REQUIRED)    Answer:   left toe callus with chronic open wound    Order Specific Question:   Preferred imaging location?    Answer:   [facility]    Order Specific Question:   Radiology Contrast Protocol - do NOT remove file path    Answer:   [email or web address]    Follow up plan: No follow-ups on file.  Ria Bush, MD

## 2018-12-02 NOTE — Patient Instructions (Addendum)
Stop trimming great toe.  Continue neosporin antibiotic ointment and bandaid daily.  Xray of great toe today.

## 2018-12-02 NOTE — Assessment & Plan Note (Addendum)
With diminished sensation and diminished pulses. Anticipate component of periph neuropathy, possible poor circulation. No signs of concomitant infection at this time. Supportive care reviewed. Check toe xray.  Advised to stop trimming calluses of toes. Offered podiatry eval - declines at this time.  Consider ABIs. H/o great toe MRSA infection years ago at [facility], s/p hospitalization and prolonged IV abx use.

## 2018-12-05 ENCOUNTER — Other Ambulatory Visit: Payer: Self-pay

## 2018-12-13 DIAGNOSIS — Z961 Presence of intraocular lens: Secondary | ICD-10-CM | POA: Diagnosis not present

## 2018-12-16 DIAGNOSIS — M9905 Segmental and somatic dysfunction of pelvic region: Secondary | ICD-10-CM | POA: Diagnosis not present

## 2018-12-16 DIAGNOSIS — M5416 Radiculopathy, lumbar region: Secondary | ICD-10-CM | POA: Diagnosis not present

## 2018-12-16 DIAGNOSIS — M9903 Segmental and somatic dysfunction of lumbar region: Secondary | ICD-10-CM | POA: Diagnosis not present

## 2018-12-16 DIAGNOSIS — M955 Acquired deformity of pelvis: Secondary | ICD-10-CM | POA: Diagnosis not present

## 2018-12-19 DIAGNOSIS — L97521 Non-pressure chronic ulcer of other part of left foot limited to breakdown of skin: Secondary | ICD-10-CM | POA: Diagnosis not present

## 2018-12-19 DIAGNOSIS — G603 Idiopathic progressive neuropathy: Secondary | ICD-10-CM | POA: Diagnosis not present

## 2018-12-19 DIAGNOSIS — L97522 Non-pressure chronic ulcer of other part of left foot with fat layer exposed: Secondary | ICD-10-CM | POA: Diagnosis not present

## 2018-12-19 DIAGNOSIS — I10 Essential (primary) hypertension: Secondary | ICD-10-CM | POA: Diagnosis not present

## 2018-12-19 DIAGNOSIS — G629 Polyneuropathy, unspecified: Secondary | ICD-10-CM | POA: Diagnosis not present

## 2018-12-26 DIAGNOSIS — L97521 Non-pressure chronic ulcer of other part of left foot limited to breakdown of skin: Secondary | ICD-10-CM | POA: Diagnosis not present

## 2018-12-26 DIAGNOSIS — G603 Idiopathic progressive neuropathy: Secondary | ICD-10-CM | POA: Diagnosis not present

## 2018-12-26 DIAGNOSIS — L97522 Non-pressure chronic ulcer of other part of left foot with fat layer exposed: Secondary | ICD-10-CM | POA: Diagnosis not present

## 2018-12-26 DIAGNOSIS — I10 Essential (primary) hypertension: Secondary | ICD-10-CM | POA: Diagnosis not present

## 2018-12-30 ENCOUNTER — Other Ambulatory Visit: Payer: Self-pay | Admitting: Family Medicine

## 2019-01-14 DIAGNOSIS — G603 Idiopathic progressive neuropathy: Secondary | ICD-10-CM | POA: Diagnosis not present

## 2019-01-14 DIAGNOSIS — L97521 Non-pressure chronic ulcer of other part of left foot limited to breakdown of skin: Secondary | ICD-10-CM | POA: Diagnosis not present

## 2019-01-14 DIAGNOSIS — L97519 Non-pressure chronic ulcer of other part of right foot with unspecified severity: Secondary | ICD-10-CM | POA: Diagnosis not present

## 2019-01-14 DIAGNOSIS — L97522 Non-pressure chronic ulcer of other part of left foot with fat layer exposed: Secondary | ICD-10-CM | POA: Diagnosis not present

## 2019-01-14 DIAGNOSIS — I10 Essential (primary) hypertension: Secondary | ICD-10-CM | POA: Diagnosis not present

## 2019-01-16 DIAGNOSIS — Z23 Encounter for immunization: Secondary | ICD-10-CM | POA: Diagnosis not present

## 2019-01-20 DIAGNOSIS — M955 Acquired deformity of pelvis: Secondary | ICD-10-CM | POA: Diagnosis not present

## 2019-01-20 DIAGNOSIS — M9903 Segmental and somatic dysfunction of lumbar region: Secondary | ICD-10-CM | POA: Diagnosis not present

## 2019-01-20 DIAGNOSIS — M5416 Radiculopathy, lumbar region: Secondary | ICD-10-CM | POA: Diagnosis not present

## 2019-01-20 DIAGNOSIS — M9905 Segmental and somatic dysfunction of pelvic region: Secondary | ICD-10-CM | POA: Diagnosis not present

## 2019-01-29 ENCOUNTER — Other Ambulatory Visit: Payer: Self-pay

## 2019-01-29 ENCOUNTER — Encounter: Payer: Medicare Other | Attending: Internal Medicine | Admitting: Internal Medicine

## 2019-01-29 DIAGNOSIS — Z7982 Long term (current) use of aspirin: Secondary | ICD-10-CM | POA: Insufficient documentation

## 2019-01-29 DIAGNOSIS — Z79899 Other long term (current) drug therapy: Secondary | ICD-10-CM | POA: Insufficient documentation

## 2019-01-29 DIAGNOSIS — M109 Gout, unspecified: Secondary | ICD-10-CM | POA: Insufficient documentation

## 2019-01-29 DIAGNOSIS — L97522 Non-pressure chronic ulcer of other part of left foot with fat layer exposed: Secondary | ICD-10-CM | POA: Diagnosis not present

## 2019-01-29 DIAGNOSIS — G609 Hereditary and idiopathic neuropathy, unspecified: Secondary | ICD-10-CM | POA: Diagnosis not present

## 2019-01-29 DIAGNOSIS — G9009 Other idiopathic peripheral autonomic neuropathy: Secondary | ICD-10-CM | POA: Diagnosis not present

## 2019-01-29 DIAGNOSIS — I10 Essential (primary) hypertension: Secondary | ICD-10-CM | POA: Diagnosis not present

## 2019-01-29 DIAGNOSIS — L97521 Non-pressure chronic ulcer of other part of left foot limited to breakdown of skin: Secondary | ICD-10-CM | POA: Diagnosis not present

## 2019-01-29 DIAGNOSIS — Z8614 Personal history of Methicillin resistant Staphylococcus aureus infection: Secondary | ICD-10-CM | POA: Diagnosis not present

## 2019-01-29 NOTE — Progress Notes (Signed)
DAID, TRESCH ([id number]) Visit Report for 01/29/2019 Abuse/Suicide Risk Screen Details Patient Name: HANDY, FLAVIN Date of Service: 01/29/2019 9:45 AM Medical Record Number: [id number] Patient Account Number: [id number] Date of Birth/Sex: [birth year] (85 y.o. M) Treating RN: Army Melia Primary Care Devan Danzer: Ria Bush Other Clinician: Referring Shoichi Mielke: Vivi Ferns Treating Latondra Gebhart/Extender: Tito Dine in Treatment: 0 Abuse/Suicide Risk Screen Items Answer ABUSE RISK SCREEN: Has anyone close to you tried to hurt or harm you recentlyo No Do you feel uncomfortable with anyone in your familyo No Has anyone forced you do things that you didnot want to doo No Electronic Signature(s) Signed: 01/29/2019 10:47:16 AM By: Army Melia Entered By: Army Melia on 01/29/2019 09:48:55 Tackett, Bart ([id number]) -------------------------------------------------------------------------------- Activities of Daily Living Details Patient Name: [name] Date of Service: 01/29/2019 9:45 AM Medical Record Number: [id number] Patient Account Number: [id number] Date of Birth/Sex: [birth year] (85 y.o. M) Treating RN: Army Melia Primary Care Nikkol Pai: Ria Bush Other Clinician: Referring Lillar Bianca: Vivi Ferns Treating Londen Lorge/Extender: Tito Dine in Treatment: 0 Activities of Daily Living Items Answer Activities of Daily Living (Please select one for each item) Drive Automobile Completely Able Take Medications Completely Able Use Telephone Completely Able Care for Appearance Completely Able Use Toilet Completely Able Bath / Shower Completely Able Dress Self Completely Able Feed Self Completely Able Walk Completely Able Get In / Out Bed Completely Able Housework Completely Able Prepare Meals Completely [place] for Self Completely Able Electronic Signature(s) Signed: 01/29/2019 10:47:16 AM By: Army Melia Entered By: Army Melia on 01/29/2019 09:49:07 Decicco, Shelly Flatten ([id number]) -------------------------------------------------------------------------------- Education Screening Details Patient Name: [name] Date of Service: 01/29/2019 9:45 AM Medical Record Number: [id number] Patient Account Number: [id number] Date of Birth/Sex: [birth year] (85 y.o. M) Treating RN: Army Melia Primary Care Joliana Claflin: Ria Bush Other Clinician: Referring Salih Williamson: Vivi Ferns Treating Windsor Zirkelbach/Extender: Tito Dine in Treatment: 0 Primary Learner Assessed: Patient Learning Preferences/Education Level/Primary Language Learning Preference: Explanation, Demonstration Highest Education Level: College or Above Preferred Language: English Cognitive Barrier Language Barrier: No Translator Needed: No Memory Deficit: No Emotional Barrier: No Cultural/Religious Beliefs Affecting Medical Care: No Physical Barrier Impaired Vision: No Impaired Hearing: No Decreased Hand dexterity: No Knowledge/Comprehension Knowledge Level: High Comprehension Level: High Ability to understand written High instructions: Ability to understand verbal High instructions: Motivation Anxiety Level: Calm Cooperation: Cooperative Education Importance: Acknowledges Need Interest in Health Problems: Asks Questions Perception: Coherent Willingness to Engage in Self- High Management Activities: Readiness to Engage in Self- High Management Activities: Electronic Signature(s) Signed: 01/29/2019 10:47:16 AM By: Army Melia Entered By: Army Melia on 01/29/2019 09:49:24 BROX, CRIADO ([id number]) -------------------------------------------------------------------------------- Fall Risk Assessment Details Patient Name: [name] Date of Service: 01/29/2019 9:45 AM Medical Record Number: [id number] Patient Account Number: [id number] Date of Birth/Sex: Apr 14, 1932 (85 y.o. M) Treating  RN: Army Melia Primary Care Kristianne Albin: Ria Bush Other Clinician: Referring Raylin Diguglielmo: Vivi Ferns Treating Hazelle Woollard/Extender: Tito Dine in Treatment: 0 Fall Risk Assessment Items Have you had 2 or more falls in the last 12 monthso 0 No Have you had any fall that resulted in injury in the last 12 monthso 0 No FALLS RISK SCREEN History of falling - immediate or within 3 months 0 No Secondary diagnosis (Do you have 2 or more medical diagnoseso) 0 No Ambulatory aid None/bed rest/wheelchair/nurse 0 No Crutches/cane/walker 0 No Furniture 0 No Intravenous therapy Access/Saline/Heparin Lock 0 No Gait/Transferring Normal/ bed rest/ wheelchair 0 No Weak (short steps with or without shuffle, stooped  but able to lift head while 0 No walking, may seek support from furniture) Impaired (short steps with shuffle, may have difficulty arising from chair, head 0 No down, impaired balance) Mental Status Oriented to own ability 0 No Electronic Signature(s) Signed: 01/29/2019 10:47:16 AM By: Army Melia Entered By: Army Melia on 01/29/2019 09:49:30 Farrugia, Zachory ([id number]) -------------------------------------------------------------------------------- Foot Assessment Details Patient Name: [name] Date of Service: 01/29/2019 9:45 AM Medical Record Number: [id number] Patient Account Number: [id number] Date of Birth/Sex: 11-11-1931 (85 y.o. M) Treating RN: Army Melia Primary Care Nikcole Eischeid: Ria Bush Other Clinician: Referring Terrie Haring: Vivi Ferns Treating Yaneth Fairbairn/Extender: Tito Dine in Treatment: 0 Foot Assessment Items Site Locations + = Sensation present, - = Sensation absent, C = Callus, U = Ulcer R = Redness, W = Warmth, M = Maceration, PU = Pre-ulcerative lesion F = Fissure, S = Swelling, D = Dryness Assessment Right: Left: Other Deformity: No No Prior Foot Ulcer: No No Prior Amputation: No No Charcot Joint: No  No Ambulatory Status: Ambulatory Without Help Gait: Steady Electronic Signature(s) Signed: 01/29/2019 10:47:16 AM By: Army Melia Entered By: Army Melia on 01/29/2019 09:50:10 Benedicto, Shelly Flatten ([id number]) -------------------------------------------------------------------------------- Nutrition Risk Screening Details Patient Name: [name] Date of Service: 01/29/2019 9:45 AM Medical Record Number: [id number] Patient Account Number: [id number] Date of Birth/Sex: 12-17-1931 (85 y.o. M) Treating RN: Army Melia Primary Care Nani Ingram: Ria Bush Other Clinician: Referring Landers Prajapati: Vivi Ferns Treating Hillari Zumwalt/Extender: Tito Dine in Treatment: 0 Height (in): 69 Weight (lbs): 227 Body Mass Index (BMI): 33.5 Nutrition Risk Screening Items Score Screening NUTRITION RISK SCREEN: I have an illness or condition that made me change the kind and/or amount of 0 No food I eat I eat fewer than two meals per day 0 No I eat few fruits and vegetables, or milk products 0 No I have three or more drinks of beer, liquor or wine almost every day 0 No I have tooth or mouth problems that make it hard for me to eat 0 No I don't always have enough money to buy the food I need 0 No I eat alone most of the time 0 No I take three or more different prescribed or over-the-counter drugs a day 0 No Without wanting to, I have lost or gained 10 pounds in the last six months 0 No I am not always physically able to shop, cook and/or feed myself 0 No Nutrition Protocols Good Risk Protocol 0 No interventions needed Moderate Risk Protocol High Risk Proctocol Risk Level: Good Risk Score: 0 Electronic Signature(s) Signed: 01/29/2019 10:47:16 AM By: Army Melia Entered By: Army Melia on 01/29/2019 09:49:49

## 2019-01-30 NOTE — Progress Notes (Addendum)
RIVEN, SQUICCIARINI ([id number]) Visit Report for 01/29/2019 Allergy List Details Patient Name: Paul, Robles Date of Service: 01/29/2019 9:45 AM Medical Record Number: [id number] Patient Account Number: [id number] Date of Birth/Sex: August 29, 1931 (85 y.o. M) Treating RN: Army Melia Primary Care Sumaya Riedesel: Ria Bush Other Clinician: Referring Verlinda Slotnick: Vivi Ferns Treating Chrisoula Zegarra/Extender: Ricard Dillon Weeks in Treatment: 0 Allergies Active Allergies No Known Allergies Allergy Notes Electronic Signature(s) Signed: 01/29/2019 10:47:16 AM By: Army Melia Entered By: Army Melia on 01/29/2019 09:39:33 Minchew, Klayten ([id number]) -------------------------------------------------------------------------------- Arrival Information Details Patient Name: [name] Date of Service: 01/29/2019 9:45 AM Medical Record Number: [id number] Patient Account Number: [id number] Date of Birth/Sex: 01-04-1932 (85 y.o. M) Treating RN: Army Melia Primary Care Ayansh Feutz: Ria Bush Other Clinician: Referring Clyde Upshaw: Vivi Ferns Treating Ian Castagna/Extender: Tito Dine in Treatment: 0 Visit Information Patient Arrived: Ambulatory Arrival Time: 09:32 Accompanied By: self Transfer Assistance: None Patient Identification Verified: Yes Patient Has Alerts: Yes Patient Alerts: Patient on Blood Thinner Notes xerelto 20mg  and aspirin 81 mg Electronic Signature(s) Signed: 01/29/2019 10:47:16 AM By: Army Melia Entered By: Army Melia on 01/29/2019 09:50:49 Migues, Xaidyn ([id number]) -------------------------------------------------------------------------------- Clinic Level of Care Assessment Details Patient Name: [name] Date of Service: 01/29/2019 9:45 AM Medical Record Number: [id number] Patient Account Number: [id number] Date of Birth/Sex: 14-Oct-1931 (85 y.o. M) Treating RN: Cornell Barman Primary Care Lida Berkery: Ria Bush Other  Clinician: Referring Kip Cropp: Vivi Ferns Treating Shaquill Iseman/Extender: Tito Dine in Treatment: 0 Clinic Level of Care Assessment Items TOOL 1 Quantity Score []  - Use when EandM and Procedure is performed on INITIAL visit 0 ASSESSMENTS - Nursing Assessment / Reassessment X - General Physical Exam (combine w/ comprehensive assessment (listed just below) when 1 20 performed on new pt. evals) X- 1 25 Comprehensive Assessment (HX, ROS, Risk Assessments, Wounds Hx, etc.) ASSESSMENTS - Wound and Skin Assessment / Reassessment []  - Dermatologic / Skin Assessment (not related to wound area) 0 ASSESSMENTS - Ostomy and/or Continence Assessment and Care []  - Incontinence Assessment and Management 0 []  - 0 Ostomy Care Assessment and Management (repouching, etc.) PROCESS - Coordination of Care X - Simple Patient / Family Education for ongoing care 1 15 []  - 0 Complex (extensive) Patient / Family Education for ongoing care []  - 0 Staff obtains Programmer, systems, Records, Test Results / Process Orders []  - 0 Staff telephones HHA, Nursing Homes / Clarify orders / etc []  - 0 Routine Transfer to another Facility (non-emergent condition) []  - 0 Routine Hospital Admission (non-emergent condition) X- 1 15 New Admissions / Biomedical engineer / Ordering NPWT, Apligraf, etc. []  - 0 Emergency Hospital Admission (emergent condition) PROCESS - Special Needs []  - Pediatric / Minor Patient Management 0 []  - 0 Isolation Patient Management []  - 0 Hearing / Language / Visual special needs []  - 0 Assessment of Community assistance (transportation, D/C planning, etc.) []  - 0 Additional assistance / Altered mentation []  - 0 Support Surface(s) Assessment (bed, cushion, seat, etc.) Pincock, Damarian ([id number]) INTERVENTIONS - Miscellaneous []  - External ear exam 0 []  - 0 Patient Transfer (multiple staff / Civil Service fast streamer / Similar devices) []  - 0 Simple Staple / Suture removal (25 or less) []   - 0 Complex Staple / Suture removal (26 or more) []  - 0 Hypo/Hyperglycemic Management (do not check if billed separately) X- 1 15 Ankle / Brachial Index (ABI) - do not check if billed separately Has the patient been seen at the hospital within the last three years: Yes Total Score: 90 Level Of Care:  New/Established - Level 3 Electronic Signature(s) Signed: 01/29/2019 5:53:54 PM By: Gretta Cool, BSN, RN, CWS, Kim RN, BSN Entered By: Gretta Cool, BSN, RN, CWS, Kim on 01/29/2019 10:20:36 JAWARA, SCHUCHART ([id number]) -------------------------------------------------------------------------------- Encounter Discharge Information Details Patient Name: [name] Date of Service: 01/29/2019 9:45 AM Medical Record Number: [id number] Patient Account Number: [id number] Date of Birth/Sex: [birth year] (85 y.o. M) Treating RN: Cornell Barman Primary Care Alizea Pell: Ria Bush Other Clinician: Referring Samanatha Brammer: Vivi Ferns Treating Sirenity Shew/Extender: Tito Dine in Treatment: 0 Encounter Discharge Information Items Post Procedure Vitals Discharge Condition: Stable Temperature (F): 97.8 Ambulatory Status: Ambulatory Pulse (bpm): 55 Discharge Destination: Home Respiratory Rate (breaths/min): 16 Transportation: Private Auto Blood Pressure (mmHg): 147/55 Accompanied By: self Schedule Follow-up Appointment: Yes Clinical Summary of Care: Electronic Signature(s) Signed: 01/29/2019 5:53:54 PM By: Gretta Cool, BSN, RN, CWS, Kim RN, BSN Entered By: Gretta Cool, BSN, RN, CWS, Kim on 01/29/2019 10:22:31 [name] ([id number]) -------------------------------------------------------------------------------- Lower Extremity Assessment Details Patient Name: [name] Date of Service: 01/29/2019 9:45 AM Medical Record Number: [id number] Patient Account Number: [id number] Date of Birth/Sex: 1931/06/04 (85 y.o. M) Treating RN: Army Melia Primary Care Emalyn Schou: Ria Bush Other  Clinician: Referring Muaad Boehning: Vivi Ferns Treating Zidane Renner/Extender: Tito Dine in Treatment: 0 Edema Assessment Assessed: [Left: No] [Right: No] Edema: [Left: No] [Right: No] Calf Left: Right: Point of Measurement: 34 cm From Medial Instep 38.5 cm cm Ankle Left: Right: Point of Measurement: 10 cm From Medial Instep 24.2 cm cm Vascular Assessment Pulses: Dorsalis Pedis Palpable: [Left:Yes] Doppler Audible: [Left:Yes] Posterior Tibial Palpable: [Left:Yes] Blood Pressure: Brachial: [Left:147] [Right:140] Dorsalis Pedis: 140 Ankle: Posterior Tibial: 130 Ankle Brachial Index: [Left:0.95] Electronic Signature(s) Signed: 01/29/2019 10:47:16 AM By: Army Melia Entered By: Army Melia on 01/29/2019 09:51:42 Cowans, Deondrick ([id number]) -------------------------------------------------------------------------------- Multi Wound Chart Details Patient Name: [name] Date of Service: 01/29/2019 9:45 AM Medical Record Number: [id number] Patient Account Number: [id number] Date of Birth/Sex: February 16, 1932 (85 y.o. M) Treating RN: Cornell Barman Primary Care Georgena Weisheit: Ria Bush Other Clinician: Referring Sundy Houchins: Vivi Ferns Treating Oron Westrup/Extender: Tito Dine in Treatment: 0 Vital Signs Height(in): 69 Pulse(bpm): 55 Weight(lbs): 227 Blood Pressure(mmHg): 147/55 Body Mass Index(BMI): 34 Temperature(F): 97.8 Respiratory Rate 16 (breaths/min): Photos: [N/A:N/A] Wound Location: Left Toe Great N/A N/A Wounding Event: Blister N/A N/A Primary Etiology: Neuropathic Ulcer-Non N/A N/A Diabetic Comorbid History: Cataracts, Chronic sinus N/A N/A problems/congestion, Hypertension, History of Burn, Gout Date Acquired: 12/07/2018 N/A N/A Weeks of Treatment: 0 N/A N/A Wound Status: Open N/A N/A Measurements L x W x D 0.8x0.5x0.4 N/A N/A (cm) Area (cm) : 0.314 N/A N/A Volume (cm) : 0.126 N/A N/A % Reduction in Area: 0.00% N/A N/A %  Reduction in Volume: 0.00% N/A N/A Starting Position 1 12 (o'clock): Ending Position 1 12 (o'clock): Maximum Distance 1 (cm): 0.6 Undermining: Yes N/A N/A Classification: Full Thickness Without N/A N/A Exposed Support Structures Exudate Amount: Medium N/A N/A Exudate Type: Serosanguineous N/A N/A Exudate Color: red, brown N/A N/A Wound Margin: Flat and Intact N/A N/A HARIS, SCRONCE ([id number]) Granulation Amount: Small (1-33%) N/A N/A Granulation Quality: Pink N/A N/A Necrotic Amount: Large (67-100%) N/A N/A Necrotic Tissue: Eschar, Adherent Slough N/A N/A Exposed Structures: Fat Layer (Subcutaneous N/A N/A Tissue) Exposed: Yes Fascia: No Tendon: No Muscle: No Joint: No Bone: No Debridement: Debridement - Excisional N/A N/A Pre-procedure 10:11 N/A N/A Verification/Time Out Taken: Pain Control: Lidocaine N/A N/A Tissue Debrided: Callus, Subcutaneous N/A N/A Level: Skin/Subcutaneous Tissue N/A N/A Debridement Area (sq cm): 0.6 N/A N/A Instrument: Blade N/A N/A  Bleeding: Moderate N/A N/A Hemostasis Achieved: Pressure N/A N/A Debridement Treatment Procedure was tolerated well N/A N/A Response: Post Debridement 1x0.6x0.5 N/A N/A Measurements L x W x D (cm) Post Debridement Volume: 0.236 N/A N/A (cm) Procedures Performed: Debridement N/A N/A Treatment Notes Wound #1 (Left Toe Great) Notes SIlver cell, foam, conform, patient will put on offloading boot on at home. Electronic Signature(s) Signed: 02/04/2019 2:14:41 PM By: Gretta Cool, BSN, RN, CWS, Kim RN, BSN Previous Signature: 01/29/2019 5:53:54 PM Version By: Gretta Cool, BSN, RN, CWS, Kim RN, BSN Entered By: Gretta Cool, BSN, RN, CWS, Kim on 02/04/2019 14:14:40 LUCIAN, SCHUENKE ([id number]) -------------------------------------------------------------------------------- Multi-Disciplinary Care Plan Details Patient Name: [name] Date of Service: 01/29/2019 9:45 AM Medical Record Number: [id number] Patient Account Number:  [id number] Date of Birth/Sex: [birth year] (85 y.o. M) Treating RN: Cornell Barman Primary Care Anette Barra: Ria Bush Other Clinician: Referring Italo Banton: Vivi Ferns Treating Melana Hingle/Extender: Tito Dine in Treatment: 0 Active Inactive Medication Nursing Diagnoses: Knowledge deficit related to medication safety: actual or potential Goals: Patient/caregiver will demonstrate understanding of all current medications Date Initiated: 01/29/2019 Target Resolution Date: 01/29/2019 Goal Status: Active Interventions: Patient/Caregiver given reconciled medication list upon admission, changes in medications and discharge from the [facility] Notes: Orientation to the Wound Care Program Nursing Diagnoses: Knowledge deficit related to the wound healing center program Goals: Patient/caregiver will verbalize understanding of the [facility] Date Initiated: 01/29/2019 Target Resolution Date: 03/05/2019 Goal Status: Active Interventions: Provide education on orientation to the wound center Notes: Wound/Skin Impairment Nursing Diagnoses: Knowledge deficit related to ulceration/compromised skin integrity Goals: Patient/caregiver will verbalize understanding of skin care regimen Date Initiated: 01/29/2019 Target Resolution Date: 02/05/2019 Goal Status: Active Ulcer/skin breakdown will have a volume reduction of 30% by week 4 Date Initiated: 01/29/2019 Target Resolution Date: 02/19/2019 ARNEL, EBEN ([id number]) Goal Status: Active Interventions: Assess patient/caregiver ability to obtain necessary supplies Assess ulceration(s) every visit Treatment Activities: Skin care regimen initiated : 01/29/2019 Notes: Electronic Signature(s) Signed: 01/29/2019 5:53:54 PM By: Gretta Cool, BSN, RN, CWS, Kim RN, BSN Entered By: Gretta Cool, BSN, RN, CWS, Kim on 01/29/2019 10:15:09 [name]  ([id number]) -------------------------------------------------------------------------------- Pain Assessment Details Patient Name: [name] Date of Service: 01/29/2019 9:45 AM Medical Record Number: [id number] Patient Account Number: [id number] Date of Birth/Sex: 07-May-1932 (85 y.o. M) Treating RN: Army Melia Primary Care Rasheda Ledger: Ria Bush Other Clinician: Referring Torri Michalski: Vivi Ferns Treating Lelania Bia/Extender: Tito Dine in Treatment: 0 Active Problems Location of Pain Severity and Description of Pain Patient Has Paino No Site Locations Pain Management and Medication Current Pain Management: Electronic Signature(s) Signed: 01/29/2019 10:47:16 AM By: Army Melia Entered By: Army Melia on 01/29/2019 09:33:09 Steenson, Shelly Flatten ([id number]) -------------------------------------------------------------------------------- Patient/Caregiver Education Details Patient Name: [name] Date of Service: 01/29/2019 9:45 AM Medical Record Number: [id number] Patient Account Number: [id number] Date of Birth/Gender: 10-Nov-1931 (85 y.o. M) Treating RN: Cornell Barman Primary Care Physician: Ria Bush Other Clinician: Referring Physician: Vivi Ferns Treating Physician/Extender: Tito Dine in Treatment: 0 Education Assessment Education Provided To: Patient Education Topics Provided Welcome To The [facility]: Handouts: Welcome To [facility] Methods: Demonstration, Explain/Verbal Responses: State content correctly Wound Debridement: Handouts: Wound Debridement Methods: Demonstration, Explain/Verbal Responses: State content correctly Wound/Skin Impairment: Handouts: Caring for Your Ulcer Methods: Demonstration Responses: State content correctly Electronic Signature(s) Signed: 01/29/2019 5:53:54 PM By: Gretta Cool, BSN, RN, CWS, Kim RN, BSN Entered By: Gretta Cool, BSN, RN, CWS, Kim on 01/29/2019 10:21:06 [name]  ([id number]) -------------------------------------------------------------------------------- Wound Assessment Details Patient Name: [name]  Date of Service: 01/29/2019 9:45 AM Medical Record Number: [id number] Patient Account Number: [id number] Date of Birth/Sex: 05/24/1931 (85 y.o. M) Treating RN: Army Melia Primary Care [name]: Ria Bush Other Clinician: Referring Mija Effertz: Vivi Ferns Treating Mathhew Buysse/Extender: Tito Dine in Treatment: 0 Wound Status Wound Number: 1 Primary Neuropathic Ulcer-Non Diabetic Etiology: Wound Location: Left Toe Great Wound Open Wounding Event: Blister Status: Date Acquired: 12/07/2018 Comorbid Cataracts, Chronic sinus problems/congestion, Weeks Of Treatment: 0 History: Hypertension, History of Burn, Gout Clustered Wound: No Photos Wound Measurements Length: (cm) 0.8 Width: (cm) 0.5 Depth: (cm) 0.4 Area: (cm) 0.314 Volume: (cm) 0.126 % Reduction in Area: 0% % Reduction in Volume: 0% Undermining: Yes Starting Position (o'clock): 12 Ending Position (o'clock): 12 Maximum Distance: (cm) 0.6 Wound Description Full Thickness Without Exposed Support Classification: Structures Wound Margin: Flat and Intact Exudate Medium Amount: Exudate Type: Serosanguineous Exudate Color: red, brown Foul Odor After Cleansing: No Slough/Fibrino Yes Wound Bed Granulation Amount: Small (1-33%) Exposed Structure Granulation Quality: Pink Fascia Exposed: No Necrotic Amount: Large (67-100%) Fat Layer (Subcutaneous Tissue) Exposed: Yes Necrotic Quality: Eschar, Adherent Slough Tendon Exposed: No Muscle Exposed: No Joint Exposed: No Malena, Kreg ([id number]) Bone Exposed: No Electronic Signature(s) Signed: 02/04/2019 2:11:13 PM By: Gretta Cool, BSN, RN, CWS, Kim RN, BSN Signed: 02/04/2019 3:51:55 PM By: Army Melia Previous Signature: 01/29/2019 10:47:16 AM Version By: Army Melia Previous Signature: 01/29/2019 5:53:54 PM  Version By: Gretta Cool, BSN, RN, CWS, Kim RN, BSN Entered By: Gretta Cool, BSN, RN, CWS, Kim on 02/04/2019 [place], [place] ([id number]) -------------------------------------------------------------------------------- Vitals Details Patient Name: [name] Date of Service: 01/29/2019 9:45 AM Medical Record Number: [id number] Patient Account Number: [id number] Date of Birth/Sex: 27-Apr-1932 (85 y.o. M) Treating RN: Army Melia Primary Care Marvene Strohm: Ria Bush Other Clinician: Referring Perian Tedder: Vivi Ferns Treating Kaedance Magos/Extender: Tito Dine in Treatment: 0 Vital Signs Time Taken: 09:33 Temperature (F): 97.8 Height (in): 69 Pulse (bpm): 55 Source: Stated Respiratory Rate (breaths/min): 16 Weight (lbs): 227 Blood Pressure (mmHg): 147/55 Body Mass Index (BMI): 33.5 Reference Range: 80 - 120 mg / dl Electronic Signature(s) Signed: 01/29/2019 10:47:16 AM By: Army Melia Entered By: Army Melia on 01/29/2019 09:34:06

## 2019-01-30 NOTE — Progress Notes (Addendum)
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 Code Description: [id number] WC PHYS LEVEL 3 o NEW PT ICD-10 Diagnosis Description 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

## 2019-02-03 DIAGNOSIS — M5416 Radiculopathy, lumbar region: Secondary | ICD-10-CM | POA: Diagnosis not present

## 2019-02-03 DIAGNOSIS — M9903 Segmental and somatic dysfunction of lumbar region: Secondary | ICD-10-CM | POA: Diagnosis not present

## 2019-02-03 DIAGNOSIS — M9905 Segmental and somatic dysfunction of pelvic region: Secondary | ICD-10-CM | POA: Diagnosis not present

## 2019-02-03 DIAGNOSIS — M955 Acquired deformity of pelvis: Secondary | ICD-10-CM | POA: Diagnosis not present

## 2019-02-04 ENCOUNTER — Other Ambulatory Visit: Payer: Self-pay | Admitting: Family Medicine

## 2019-02-04 DIAGNOSIS — E785 Hyperlipidemia, unspecified: Secondary | ICD-10-CM

## 2019-02-04 DIAGNOSIS — I10 Essential (primary) hypertension: Secondary | ICD-10-CM

## 2019-02-04 DIAGNOSIS — M1A00X0 Idiopathic chronic gout, unspecified site, without tophus (tophi): Secondary | ICD-10-CM

## 2019-02-05 ENCOUNTER — Other Ambulatory Visit: Payer: Self-pay

## 2019-02-05 ENCOUNTER — Other Ambulatory Visit: Payer: Medicare Other

## 2019-02-05 ENCOUNTER — Other Ambulatory Visit (INDEPENDENT_AMBULATORY_CARE_PROVIDER_SITE_OTHER): Payer: Medicare Other

## 2019-02-05 ENCOUNTER — Encounter: Payer: Medicare Other | Admitting: Internal Medicine

## 2019-02-05 DIAGNOSIS — Z8614 Personal history of Methicillin resistant Staphylococcus aureus infection: Secondary | ICD-10-CM | POA: Diagnosis not present

## 2019-02-05 DIAGNOSIS — M109 Gout, unspecified: Secondary | ICD-10-CM | POA: Diagnosis not present

## 2019-02-05 DIAGNOSIS — Z7982 Long term (current) use of aspirin: Secondary | ICD-10-CM | POA: Diagnosis not present

## 2019-02-05 DIAGNOSIS — M1A00X0 Idiopathic chronic gout, unspecified site, without tophus (tophi): Secondary | ICD-10-CM | POA: Diagnosis not present

## 2019-02-05 DIAGNOSIS — G609 Hereditary and idiopathic neuropathy, unspecified: Secondary | ICD-10-CM | POA: Diagnosis not present

## 2019-02-05 DIAGNOSIS — L97521 Non-pressure chronic ulcer of other part of left foot limited to breakdown of skin: Secondary | ICD-10-CM | POA: Diagnosis not present

## 2019-02-05 DIAGNOSIS — I10 Essential (primary) hypertension: Secondary | ICD-10-CM | POA: Diagnosis not present

## 2019-02-05 DIAGNOSIS — E785 Hyperlipidemia, unspecified: Secondary | ICD-10-CM

## 2019-02-05 DIAGNOSIS — G9009 Other idiopathic peripheral autonomic neuropathy: Secondary | ICD-10-CM | POA: Diagnosis not present

## 2019-02-05 DIAGNOSIS — S91102A Unspecified open wound of left great toe without damage to nail, initial encounter: Secondary | ICD-10-CM | POA: Diagnosis not present

## 2019-02-05 LAB — LIPID PANEL
Cholesterol: 131 mg/dL (ref 0–200)
HDL: 32.1 mg/dL — ABNORMAL LOW (ref >39.00)
NonHDL: 98.51
Total CHOL/HDL Ratio: 4
Triglycerides: 251 mg/dL — ABNORMAL HIGH (ref 0.0–149.0)
VLDL: 50.2 mg/dL — ABNORMAL HIGH (ref 0.0–40.0)

## 2019-02-05 LAB — COMPREHENSIVE METABOLIC PANEL
ALT: 23 U/L (ref 0–53)
AST: 23 U/L (ref 0–37)
Albumin: 4 g/dL (ref 3.5–5.2)
Alkaline Phosphatase: 53 U/L (ref 39–117)
BUN: 19 mg/dL (ref 6–23)
CO2: 27 mEq/L (ref 19–32)
Calcium: 9.1 mg/dL (ref 8.4–10.5)
Chloride: 105 mEq/L (ref 96–112)
Creatinine, Ser: 1.18 mg/dL (ref 0.40–1.50)
GFR: 58.65 mL/min — ABNORMAL LOW (ref >60.00)
Glucose, Bld: 115 mg/dL — ABNORMAL HIGH (ref 70–99)
Potassium: 4.4 mEq/L (ref 3.5–5.1)
Sodium: 141 mEq/L (ref 135–145)
Total Bilirubin: 0.4 mg/dL (ref 0.2–1.2)
Total Protein: 6.5 g/dL (ref 6.0–8.3)

## 2019-02-05 LAB — MICROALBUMIN / CREATININE URINE RATIO
Creatinine,U: 145.5 mg/dL
Microalb Creat Ratio: 1.1 mg/g (ref 0.0–30.0)
Microalb, Ur: 1.6 mg/dL (ref 0.0–1.9)

## 2019-02-05 LAB — LDL CHOLESTEROL, DIRECT: Direct LDL: 76 mg/dL

## 2019-02-05 LAB — URIC ACID: Uric Acid, Serum: 4.2 mg/dL (ref 4.0–7.8)

## 2019-02-05 LAB — TSH: TSH: 3.43 u[IU]/mL (ref 0.35–4.50)
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
## 2019-02-05 NOTE — Progress Notes (Signed)
DAYN, SPEIRS ([id number]) Visit Report for 02/05/2019 Arrival Information Details Patient Name: [name], [name] Date of Service: 02/05/2019 9:00 AM Medical Record Number: [id number] Patient Account Number: [id number] Date of Birth/Sex: 1932/03/21 (85 y.o. M) Treating RN: Army Melia Primary Care Carrie Schoonmaker: Ria Bush Other Clinician: Referring Giulian Goldring: Ria Bush Treating Anastaisa Wooding/Extender: Tito Dine in Treatment: 1 Visit Information History Since Last Visit Added or deleted any medications: No Patient Arrived: Ambulatory Any new allergies or adverse reactions: No Arrival Time: 08:54 Had a fall or experienced change in No Accompanied By: self activities of daily living that may affect Transfer Assistance: None risk of falls: Patient Has Alerts: Yes Signs or symptoms of abuse/neglect since last visito No Patient Alerts: Patient on Blood Thinner Hospitalized since last visit: No Has Dressing in Place as Prescribed: Yes Pain Present Now: No Electronic Signature(s) Signed: 02/05/2019 10:54:02 AM By: Army Melia Entered By: Army Melia on 02/05/2019 08:55:13 Shingler, Miner ([id number]) -------------------------------------------------------------------------------- Clinic Level of Care Assessment Details Patient Name: [name] Date of Service: 02/05/2019 9:00 AM Medical Record Number: [id number] Patient Account Number: [id number] Date of Birth/Sex: [birth year] (85 y.o. M) Treating RN: Cornell Barman Primary Care Azrael Huss: Ria Bush Other Clinician: Referring Elaiza Shoberg: Ria Bush Treating Ezrael Sam/Extender: Tito Dine in Treatment: 1 Clinic Level of Care Assessment Items TOOL 4 Quantity Score []  - Use when only an EandM is performed on FOLLOW-UP visit 0 ASSESSMENTS - Nursing Assessment / Reassessment X - Reassessment of Co-morbidities (includes updates in patient status) 1 10 X- 1 5 Reassessment of Adherence to  Treatment Plan ASSESSMENTS - Wound and Skin Assessment / Reassessment X - Simple Wound Assessment / Reassessment - one wound 1 5 []  - 0 Complex Wound Assessment / Reassessment - multiple wounds []  - 0 Dermatologic / Skin Assessment (not related to wound area) ASSESSMENTS - Focused Assessment []  - Circumferential Edema Measurements - multi extremities 0 []  - 0 Nutritional Assessment / Counseling / Intervention []  - 0 Lower Extremity Assessment (monofilament, tuning fork, pulses) []  - 0 Peripheral Arterial Disease Assessment (using hand held doppler) ASSESSMENTS - Ostomy and/or Continence Assessment and Care []  - Incontinence Assessment and Management 0 []  - 0 Ostomy Care Assessment and Management (repouching, etc.) PROCESS - Coordination of Care X - Simple Patient / Family Education for ongoing care 1 15 []  - 0 Complex (extensive) Patient / Family Education for ongoing care []  - 0 Staff obtains Programmer, systems, Records, Test Results / Process Orders []  - 0 Staff telephones HHA, Nursing Homes / Clarify orders / etc []  - 0 Routine Transfer to another Facility (non-emergent condition) []  - 0 Routine Hospital Admission (non-emergent condition) []  - 0 New Admissions / Biomedical engineer / Ordering NPWT, Apligraf, etc. []  - 0 Emergency Hospital Admission (emergent condition) X- 1 10 Simple Discharge Coordination RANALDO, BARASCH ([id number]) []  - 0 Complex (extensive) Discharge Coordination PROCESS - Special Needs []  - Pediatric / Minor Patient Management 0 []  - 0 Isolation Patient Management []  - 0 Hearing / Language / Visual special needs []  - 0 Assessment of Community assistance (transportation, D/C planning, etc.) []  - 0 Additional assistance / Altered mentation []  - 0 Support Surface(s) Assessment (bed, cushion, seat, etc.) INTERVENTIONS - Wound Cleansing / Measurement X - Simple Wound Cleansing - one wound 1 5 []  - 0 Complex Wound Cleansing - multiple wounds X- 1  5 Wound Imaging (photographs - any number of wounds) []  - 0 Wound Tracing (instead of photographs) X- 1 5 Simple Wound Measurement - one wound []  -  0 Complex Wound Measurement - multiple wounds INTERVENTIONS - Wound Dressings []  - Small Wound Dressing one or multiple wounds 0 X- 1 15 Medium Wound Dressing one or multiple wounds []  - 0 Large Wound Dressing one or multiple wounds []  - 0 Application of Medications - topical []  - 0 Application of Medications - injection INTERVENTIONS - Miscellaneous []  - External ear exam 0 []  - 0 Specimen Collection (cultures, biopsies, blood, body fluids, etc.) []  - 0 Specimen(s) / Culture(s) sent or taken to Lab for analysis []  - 0 Patient Transfer (multiple staff / Civil Service fast streamer / Similar devices) []  - 0 Simple Staple / Suture removal (25 or less) []  - 0 Complex Staple / Suture removal (26 or more) []  - 0 Hypo / Hyperglycemic Management (close monitor of Blood Glucose) []  - 0 Ankle / Brachial Index (ABI) - do not check if billed separately X- 1 5 Vital Signs Voorhies, Amara ([id number]) Has the patient been seen at the hospital within the last three years: Yes Total Score: 80 Level Of Care: New/Established - Level 3 Electronic Signature(s) Signed: 02/05/2019 5:01:16 PM By: Gretta Cool, BSN, RN, CWS, Kim RN, BSN Entered By: Gretta Cool, BSN, RN, CWS, Kim on 02/05/2019 09:06:47 [name] ([id number]) -------------------------------------------------------------------------------- Encounter Discharge Information Details Patient Name: [name] Date of Service: 02/05/2019 9:00 AM Medical Record Number: [id number] Patient Account Number: [id number] Date of Birth/Sex: 12-May-1931 (85 y.o. M) Treating RN: Cornell Barman Primary Care Nyelli Samara: Ria Bush Other Clinician: Referring Jamesyn Lindell: Ria Bush Treating Erion Hermans/Extender: Tito Dine in Treatment: 1 Encounter Discharge Information Items Discharge Condition:  Stable Ambulatory Status: Ambulatory Discharge Destination: Home Transportation: Private Auto Accompanied By: self Schedule Follow-up Appointment: Yes Clinical Summary of Care: Electronic Signature(s) Signed: 02/05/2019 5:01:16 PM By: Gretta Cool, BSN, RN, CWS, Kim RN, BSN Entered By: Gretta Cool, BSN, RN, CWS, Kim on 02/05/2019 09:08:01 [name] ([id number]) -------------------------------------------------------------------------------- Lower Extremity Assessment Details Patient Name: [name] Date of Service: 02/05/2019 9:00 AM Medical Record Number: [id number] Patient Account Number: [id number] Date of Birth/Sex: [birth year] (85 y.o. M) Treating RN: Army Melia Primary Care Najee Cowens: Ria Bush Other Clinician: Referring Jawanna Dykman: Ria Bush Treating Jlee Harkless/Extender: Ricard Dillon Weeks in Treatment: 1 Edema Assessment Assessed: [Left: No] [Right: No] Edema: [Left: N] [Right: o] Vascular Assessment Pulses: Dorsalis Pedis Palpable: [Left:Yes] Electronic Signature(s) Signed: 02/05/2019 10:54:02 AM By: Army Melia Entered By: Army Melia on 02/05/2019 08:58:49 Montesano, Olon ([id number]) -------------------------------------------------------------------------------- Multi Wound Chart Details Patient Name: [name] Date of Service: 02/05/2019 9:00 AM Medical Record Number: [id number] Patient Account Number: [id number] Date of Birth/Sex: 18-May-1931 (85 y.o. M) Treating RN: Cornell Barman Primary Care Elster Corbello: Ria Bush Other Clinician: Referring Ranetta Armacost: Ria Bush Treating Newman Waren/Extender: Tito Dine in Treatment: 1 Vital Signs Height(in): 69 Pulse(bpm): 63 Weight(lbs): 227 Blood Pressure(mmHg): 136/63 Body Mass Index(BMI): 34 Temperature(F): 97.5 Respiratory Rate 16 (breaths/min): Photos: [N/A:N/A] Wound Location: Left Toe Great N/A N/A Wounding Event: Blister N/A N/A Primary Etiology: Neuropathic Ulcer-Non  N/A N/A Diabetic Comorbid History: Cataracts, Chronic sinus N/A N/A problems/congestion, Hypertension, History of Burn, Gout Date Acquired: 12/07/2018 N/A N/A Weeks of Treatment: 1 N/A N/A Wound Status: Open N/A N/A Measurements L x W x D 0.4x0.2x0.2 N/A N/A (cm) Area (cm) : 0.063 N/A N/A Volume (cm) : 0.013 N/A N/A % Reduction in Area: 79.90% N/A N/A % Reduction in Volume: 89.70% N/A N/A Classification: Full Thickness Without N/A N/A Exposed Support Structures Exudate Amount: Medium N/A N/A Exudate Type: Serosanguineous N/A N/A Exudate Color: red, brown N/A N/A Wound  Margin: Flat and Intact N/A N/A Granulation Amount: Medium (34-66%) N/A N/A Granulation Quality: Pink N/A N/A Necrotic Amount: Medium (34-66%) N/A N/A Necrotic Tissue: Eschar, Adherent Slough N/A N/A Exposed Structures: Fat Layer (Subcutaneous N/A N/A Tissue) Exposed: JAHID, APLEY ([id number]) Fascia: No Tendon: No Muscle: No Joint: No Bone: No Epithelialization: Medium (34-66%) N/A N/A Treatment Notes Wound #1 (Left Toe Great) Notes SIlver cell, foam, conform, offloading shoe Electronic Signature(s) Signed: 02/05/2019 4:51:07 PM By: Linton Ham MD Entered By: Linton Ham on 02/05/2019 09:19:25 [name] ([id number]) -------------------------------------------------------------------------------- [place] Details Patient Name: [name] Date of Service: 02/05/2019 9:00 AM Medical Record Number: [id number] Patient Account Number: [id number] Date of Birth/Sex: May 14, 1931 (85 y.o. M) Treating RN: Cornell Barman Primary Care Askari Kinley: Ria Bush Other Clinician: Referring Jordani Nunn: Ria Bush Treating Raphael Espe/Extender: Tito Dine in Treatment: 1 Active Inactive Medication Nursing Diagnoses: Knowledge deficit related to medication safety: actual or potential Goals: Patient/caregiver will demonstrate understanding of all current  medications Date Initiated: 01/29/2019 Target Resolution Date: 01/29/2019 Goal Status: Active Interventions: Patient/Caregiver given reconciled medication list upon admission, changes in medications and discharge from the [facility] Notes: Orientation to the Wound Care Program Nursing Diagnoses: Knowledge deficit related to the wound healing center program Goals: Patient/caregiver will verbalize understanding of the [facility] Date Initiated: 01/29/2019 Target Resolution Date: 03/05/2019 Goal Status: Active Interventions: Provide education on orientation to the wound center Notes: Wound/Skin Impairment Nursing Diagnoses: Knowledge deficit related to ulceration/compromised skin integrity Goals: Patient/caregiver will verbalize understanding of skin care regimen Date Initiated: 01/29/2019 Target Resolution Date: 02/05/2019 Goal Status: Active Ulcer/skin breakdown will have a volume reduction of 30% by week 4 Date Initiated: 01/29/2019 Target Resolution Date: 02/19/2019 CARLENS, MERKER ([id number]) Goal Status: Active Interventions: Assess patient/caregiver ability to obtain necessary supplies Assess ulceration(s) every visit Treatment Activities: Skin care regimen initiated : 01/29/2019 Notes: Electronic Signature(s) Signed: 02/05/2019 5:01:16 PM By: Gretta Cool, BSN, RN, CWS, Kim RN, BSN Entered By: Gretta Cool, BSN, RN, CWS, Kim on 02/05/2019 09:04:51 [name] ([id number]) -------------------------------------------------------------------------------- Pain Assessment Details Patient Name: [name] Date of Service: 02/05/2019 9:00 AM Medical Record Number: [id number] Patient Account Number: [id number] Date of Birth/Sex: 1932-01-11 (85 y.o. M) Treating RN: Army Melia Primary Care Davey Limas: Ria Bush Other Clinician: Referring Bennie Scaff: Ria Bush Treating Mercedes Fort/Extender: Tito Dine in Treatment: 1 Active Problems Location  of Pain Severity and Description of Pain Patient Has Paino No Site Locations Pain Management and Medication Current Pain Management: Electronic Signature(s) Signed: 02/05/2019 10:54:02 AM By: Army Melia Entered By: Army Melia on 02/05/2019 08:55:22 Epps, Ellias ([id number]) -------------------------------------------------------------------------------- Patient/Caregiver Education Details Patient Name: [name] Date of Service: 02/05/2019 9:00 AM Medical Record Number: [id number] Patient Account Number: [id number] Date of Birth/Gender: 06-04-1931 (85 y.o. M) Treating RN: Cornell Barman Primary Care Physician: Ria Bush Other Clinician: Referring Physician: Ria Bush Treating Physician/Extender: Tito Dine in Treatment: 1 Education Assessment Education Provided To: Patient Education Topics Provided Wound/Skin Impairment: Handouts: Caring for Your Ulcer Methods: Demonstration, Explain/Verbal Responses: State content correctly Electronic Signature(s) Signed: 02/05/2019 5:01:16 PM By: Gretta Cool, BSN, RN, CWS, Kim RN, BSN Entered By: Gretta Cool, BSN, RN, CWS, Kim on 02/05/2019 09:07:16 [name] ([id number]) -------------------------------------------------------------------------------- Wound Assessment Details Patient Name: [name] Date of Service: 02/05/2019 9:00 AM Medical Record Number: [id number] Patient Account Number: [id number] Date of Birth/Sex: November 02, 1931 (85 y.o. M) Treating RN: Army Melia Primary Care Mackenzy Eisenberg: Ria Bush Other Clinician: Referring Carlena Ruybal: Ria Bush Treating Giannie Soliday/Extender: Ricard Dillon Weeks in Treatment:  1 Wound Status Wound Number: 1 Primary Neuropathic Ulcer-Non Diabetic Etiology: Wound Location: Left Toe Great Wound Open Wounding Event: Blister Status: Date Acquired: 12/07/2018 Comorbid Cataracts, Chronic sinus problems/congestion, Weeks Of Treatment: 1 History: Hypertension,  History of Burn, Gout Clustered Wound: No Photos Wound Measurements Length: (cm) 0.4 Width: (cm) 0.2 Depth: (cm) 0.2 Area: (cm) 0.063 Volume: (cm) 0.013 % Reduction in Area: 79.9% % Reduction in Volume: 89.7% Epithelialization: Medium (34-66%) Tunneling: No Undermining: No Wound Description Full Thickness Without Exposed Support Classification: Structures Wound Margin: Flat and Intact Exudate Medium Amount: Exudate Type: Serosanguineous Exudate Color: red, brown Foul Odor After Cleansing: No Slough/Fibrino Yes Wound Bed Granulation Amount: Medium (34-66%) Exposed Structure Granulation Quality: Pink Fascia Exposed: No Necrotic Amount: Medium (34-66%) Fat Layer (Subcutaneous Tissue) Exposed: Yes Necrotic Quality: Eschar, Adherent Slough Tendon Exposed: No Muscle Exposed: No Joint Exposed: No Bone Exposed: No Doring, Atul ([id number]) Treatment Notes Wound #1 (Left Toe Great) Notes SIlver cell, foam, conform, offloading shoe Electronic Signature(s) Signed: 02/05/2019 10:54:02 AM By: Army Melia Entered By: Army Melia on 02/05/2019 08:58:32 Beigel, Antario ([id number]) -------------------------------------------------------------------------------- Vitals Details Patient Name: [name] Date of Service: 02/05/2019 9:00 AM Medical Record Number: [id number] Patient Account Number: [id number] Date of Birth/Sex: [birth year] (85 y.o. M) Treating RN: Army Melia Primary Care Jessicaann Overbaugh: Ria Bush Other Clinician: Referring Danniel Grenz: Ria Bush Treating Milos Milligan/Extender: Tito Dine in Treatment: 1 Vital Signs Time Taken: 08:55 Temperature (F): 97.5 Height (in): 69 Pulse (bpm): 63 Weight (lbs): 227 Respiratory Rate (breaths/min): 16 Body Mass Index (BMI): 33.5 Blood Pressure (mmHg): 136/63 Reference Range: 80 - 120 mg / dl Electronic Signature(s) Signed: 02/05/2019 10:54:02 AM By: Army Melia Entered By: Army Melia on  02/05/2019 08:56:06

## 2019-02-11 ENCOUNTER — Other Ambulatory Visit: Payer: Self-pay | Admitting: Family Medicine

## 2019-02-12 ENCOUNTER — Encounter: Payer: Medicare Other | Attending: Internal Medicine | Admitting: Internal Medicine

## 2019-02-12 ENCOUNTER — Ambulatory Visit (INDEPENDENT_AMBULATORY_CARE_PROVIDER_SITE_OTHER): Payer: Medicare Other | Admitting: Family Medicine

## 2019-02-12 ENCOUNTER — Other Ambulatory Visit: Payer: Self-pay

## 2019-02-12 ENCOUNTER — Encounter: Payer: Self-pay | Admitting: Family Medicine

## 2019-02-12 VITALS — BP 150/68 | HR 64 | Temp 98.4°F | Ht 68.75 in | Wt 234.2 lb

## 2019-02-12 DIAGNOSIS — G9009 Other idiopathic peripheral autonomic neuropathy: Secondary | ICD-10-CM | POA: Insufficient documentation

## 2019-02-12 DIAGNOSIS — G4733 Obstructive sleep apnea (adult) (pediatric): Secondary | ICD-10-CM | POA: Diagnosis not present

## 2019-02-12 DIAGNOSIS — K219 Gastro-esophageal reflux disease without esophagitis: Secondary | ICD-10-CM

## 2019-02-12 DIAGNOSIS — L97522 Non-pressure chronic ulcer of other part of left foot with fat layer exposed: Secondary | ICD-10-CM | POA: Diagnosis not present

## 2019-02-12 DIAGNOSIS — E785 Hyperlipidemia, unspecified: Secondary | ICD-10-CM | POA: Diagnosis not present

## 2019-02-12 DIAGNOSIS — Z8614 Personal history of Methicillin resistant Staphylococcus aureus infection: Secondary | ICD-10-CM | POA: Insufficient documentation

## 2019-02-12 DIAGNOSIS — E669 Obesity, unspecified: Secondary | ICD-10-CM | POA: Diagnosis not present

## 2019-02-12 DIAGNOSIS — R7401 Elevation of levels of liver transaminase levels: Secondary | ICD-10-CM | POA: Diagnosis not present

## 2019-02-12 DIAGNOSIS — Z9989 Dependence on other enabling machines and devices: Secondary | ICD-10-CM | POA: Diagnosis not present

## 2019-02-12 DIAGNOSIS — M1A00X0 Idiopathic chronic gout, unspecified site, without tophus (tophi): Secondary | ICD-10-CM

## 2019-02-12 DIAGNOSIS — S91109S Unspecified open wound of unspecified toe(s) without damage to nail, sequela: Secondary | ICD-10-CM | POA: Diagnosis not present

## 2019-02-12 DIAGNOSIS — Z0000 Encounter for general adult medical examination without abnormal findings: Secondary | ICD-10-CM | POA: Diagnosis not present

## 2019-02-12 DIAGNOSIS — Z7189 Other specified counseling: Secondary | ICD-10-CM

## 2019-02-12 DIAGNOSIS — R739 Hyperglycemia, unspecified: Secondary | ICD-10-CM | POA: Diagnosis not present

## 2019-02-12 DIAGNOSIS — I6523 Occlusion and stenosis of bilateral carotid arteries: Secondary | ICD-10-CM

## 2019-02-12 DIAGNOSIS — I10 Essential (primary) hypertension: Secondary | ICD-10-CM

## 2019-02-12 LAB — POCT GLYCOSYLATED HEMOGLOBIN (HGB A1C): Hemoglobin A1C: 5.6 % (ref 4.0–5.6)

## 2019-02-12 MED ORDER — HYDROCHLOROTHIAZIDE 12.5 MG PO CAPS: ORAL_CAPSULE | ORAL | 3 refills | Status: DC

## 2019-02-12 MED ORDER — ALLOPURINOL 100 MG PO TABS: 200.0000 mg | ORAL_TABLET | Freq: Every day | ORAL | 3 refills | Status: DC

## 2019-02-12 MED ORDER — METOPROLOL SUCCINATE ER 25 MG PO TB24: ORAL_TABLET | ORAL | 3 refills | Status: DC

## 2019-02-12 NOTE — Progress Notes (Signed)
This visit was conducted in person.  BP (!) 150/68 (BP Location: Right Arm, Patient Position: Sitting, Cuff Size: Normal)   Pulse 64   Temp 98.4 F (36.9 C) (Temporal)   Ht 5' 8.75" (1.746 m)   Wt 234 lb 4 oz (106.3 kg)   SpO2 95%   BMI 34.84 kg/m   No data found. orthostatic VS normal on repeat 160/80  CC: AMW Subjective:    Patient ID: [name], male    DOB: 21-Aug-1931, 85 y.o.   MRN: [id number]  HPI: [name] is a 85 y.o. male presenting on 02/12/2019 for Medicare Wellness (Requests 90-day refill for HCTZ. )   Did not see health advisor this year.               Hearing Screening   125Hz  250Hz  500Hz  1000Hz  2000Hz  3000Hz  4000Hz  6000Hz  8000Hz   Right ear:   25 40 40  20    Left ear:   40 0 0  0    Comments: Wears bilateral hearing aids.  Not wearing for today's exam  Vision Screening Comments: Last eye exam, 01/2019    Office Visit from 02/12/2019 in [place] at [facility] Total Score  0      Fall Risk  02/12/2019 08/28/2017 08/23/2016  Falls in the past year? 0 No No    L great toe callus with chronic opening [place]. Now followed by local wound clinic.   He is working on his Dentist.   Preventative: Colon cancer screening -normal colonoscopies, last 10 yrs ago (~2009) Prostate cancer screening -age out Lung cancer screening -not eligible Flu shot -yearly Tdap 2014 Pneumovax 2012, prevnar 2016 zostavax 2009 shingrix - discussed Advanced directive discussion -needs to update. Would want wife to be HCPOA.Packet provided today. Seat belt use discussed Sunscreen use discussed. No changing moles on skin.  Non smoker Alcohol - was heavy drinker, has cut down significantly  Dentist q6 mo Eye exam yearly ([place])  Bowel - constipation - managed with miralax Bladder - no incontinence  Widower. 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 Interim medical history since our last visit reviewed. Allergies and medications reviewed and updated. Outpatient Medications Prior to Visit  Medication Sig Dispense Refill  . aspirin EC 81 MG tablet Take 81 mg by mouth daily.    . cetirizine (ZYRTEC) 10 MG tablet Take 10 mg by mouth daily. Alternates weekly with allegra.    Marland Kitchen esomeprazole (NEXIUM) 20 MG capsule Take 20 mg by mouth daily at 12 noon.    . fexofenadine (ALLEGRA) 180 MG tablet Take 180 mg by mouth daily. Alternates weekly with zyrtec.    Marland Kitchen GLUCOSAMINE-CHONDROITIN-MSM PO Take 2 tablets by mouth daily.     . Lactobacillus (ACIDOPHILUS PO) Take 1 capsule by mouth as needed.    . Milk Thistle 1000 MG CAPS Take 1 capsule by mouth daily.    . Multiple Vitamins-Minerals (MULTIVITAMIN ADULT PO) Take 1 tablet by mouth daily.    . polyethylene glycol (MIRALAX / GLYCOLAX) packet Take 17 g by mouth daily.    Marland Kitchen allopurinol (ZYLOPRIM) 300 MG tablet TAKE 1 TABLET BY MOUTH DAILY 90 tablet 1  . hydrochlorothiazide (  MICROZIDE) 12.5 MG capsule TAKE 1 CAPSULE(12.5 MG) BY MOUTH DAILY 30 capsule 0  . metoprolol succinate (TOPROL-XL) 25 MG 24 hr tablet TAKE 1 TABLET BY MOUTH DAILY. TAKE WITH OR IMMEDIATELY FOLLOWING A MEAL 90 tablet 1   No facility-administered medications prior to visit.      Per HPI unless specifically indicated in ROS section below Review of Systems  Constitutional: Negative for activity change, appetite change, chills, fatigue, fever and unexpected weight change.  HENT: Positive for congestion (fall allergies). Negative for hearing loss.   Eyes: Negative for visual disturbance.  Respiratory: Negative for cough, chest tightness, shortness of breath and wheezing.   Cardiovascular: Negative  for chest pain, palpitations and leg swelling.  Gastrointestinal: Negative for abdominal distention, abdominal pain, blood in stool, constipation, diarrhea, nausea and vomiting.  Genitourinary: Negative for difficulty urinating and hematuria.  Musculoskeletal: Negative for arthralgias, myalgias and neck pain.  Skin: Negative for rash.  Neurological: Negative for dizziness, seizures, syncope and headaches.  Hematological: Negative for adenopathy. Does not bruise/bleed easily.  Psychiatric/Behavioral: Negative for dysphoric mood. The patient is not nervous/anxious.    Objective:    BP (!) 150/68 (BP Location: Right Arm, Patient Position: Sitting, Cuff Size: Normal)   Pulse 64   Temp 98.4 F (36.9 C) (Temporal)   Ht 5' 8.75" (1.746 m)   Wt 234 lb 4 oz (106.3 kg)   SpO2 95%   BMI 34.84 kg/m   Wt Readings from Last 3 Encounters:  02/12/19 234 lb 4 oz (106.3 kg)  12/02/18 229 lb 6 oz (104 kg)  11/14/17 231 lb 8 oz (105 kg)    Physical Exam Vitals signs and nursing note reviewed.  Constitutional:      General: He is not in acute distress.    Appearance: Normal appearance. He is well-developed. He is not ill-appearing.  HENT:     Head: Normocephalic and atraumatic.     Right Ear: Hearing, tympanic membrane, ear canal and external ear normal.     Left Ear: Hearing, tympanic membrane, ear canal and external ear normal.     Nose: Nose normal.     Mouth/Throat:     Mouth: Mucous membranes are moist.     Pharynx: Oropharynx is clear. Uvula midline. No posterior oropharyngeal erythema.  Eyes:     General: No scleral icterus.    Extraocular Movements: Extraocular movements intact.     Conjunctiva/sclera: Conjunctivae normal.     Pupils: Pupils are equal, round, and reactive to light.  Neck:     Musculoskeletal: Normal range of motion and neck supple.     Vascular: Carotid bruit (bilateral) present.  Cardiovascular:     Rate and Rhythm: Normal rate and regular rhythm.     Pulses:  Normal pulses.          Radial pulses are 2+ on the right side and 2+ on the left side.     Heart sounds: Normal heart sounds. No murmur (none appreciated).  Pulmonary:     Effort: Pulmonary effort is normal. No respiratory distress.     Breath sounds: Normal breath sounds. No wheezing, rhonchi or rales.  Abdominal:     General: Abdomen is flat. Bowel sounds are normal. There is no distension.     Palpations: Abdomen is soft. There is no mass.     Tenderness: There is no abdominal tenderness. There is no guarding or rebound.     Hernia: No hernia is present.  Musculoskeletal: Normal range of motion.  Lymphadenopathy:     Cervical: No cervical adenopathy.  Skin:    General: Skin is warm and dry.     Findings: No rash.  Neurological:     General: No focal deficit present.     Mental Status: He is alert and oriented to person, place, and time.     Comments:  CN grossly intact, station and gait intact Recall 0/3, 0/3 with cue Calculation 5/5 [id number]  Psychiatric:        Mood and Affect: Mood normal.        Behavior: Behavior normal.        Thought Content: Thought content normal.        Judgment: Judgment normal.       Results for orders placed or performed in visit on 02/12/19  POCT glycosylated hemoglobin (Hb A1C)  Result Value Ref Range   Hemoglobin A1C 5.6 4.0 - 5.6 %   HbA1c POC (<> result, manual entry)     HbA1c, POC (prediabetic range)     HbA1c, POC (controlled diabetic range)     Assessment & Plan:   Problem List Items Addressed This Visit    Transaminitis    This has improved with decreased alcohol consumption.       OSA on CPAP    Managed by neurology.      Open toe wound, sequela    Has established with local wound clinic       Obesity, Class I, BMI 30.0-34.9 (see actual BMI)    Continue to encourage healthy diet and lifestyle changes to affect sustainable weight loss.       Medicare annual wellness visit, subsequent - Primary    I have  personally reviewed the Medicare Annual Wellness questionnaire and have noted 1. The patient's medical and social history 2. Their use of alcohol, tobacco or illicit drugs 3. Their current medications and supplements 4. The patient's functional ability including ADL's, fall risks, home safety risks and hearing or visual impairment. Cognitive function has been assessed and addressed as indicated.  5. Diet and physical activity 6. Evidence for depression or mood disorders The patients weight, height, BMI have been recorded in the chart. I have made referrals, counseling and provided education to the patient based on review of the above and I have provided the pt with a written personalized care plan for preventive services. Provider list updated.. See scanned questionairre as needed for further documentation. Reviewed preventative protocols and updated unless pt declined.       Hyperglycemia    Update A1c.       Relevant Orders   POCT glycosylated hemoglobin (Hb A1C) (Completed)   HTN (hypertension)    Chronic ,elevated today. Advised he start monitoring BP at home and let me know if consistently >150/90 to titrate medication.       Relevant Medications   hydrochlorothiazide (MICROZIDE) 12.5 MG capsule   metoprolol succinate (TOPROL-XL) 25 MG 24 hr tablet   Gout    Great control without recent flare - suggested decreasing allopurinol dose - will trial 200mg  daily.  Caution with HCTZ use.       GERD (gastroesophageal reflux disease)    Will trial QOD nexium.       Dyslipidemia    Chronic, off statin. Given age will not start. The ASCVD Risk score [name] DC Jr., et al., 2013) failed to calculate for the following reasons:   The 2013 ASCVD risk score is only valid for ages 23 to 21  Carotid stenosis, asymptomatic, bilateral    Update carotid US      Relevant Medications   hydrochlorothiazide (MICROZIDE) 12.5 MG capsule   metoprolol succinate (TOPROL-XL) 25 MG 24 hr  tablet   Other Relevant Orders   VAS US CAROTID   Advanced care planning/counseling discussion    Advanced directive discussion - needs to update. Would want wife to be HCPOA. Packet provided today.           Meds ordered this encounter  Medications  . hydrochlorothiazide (MICROZIDE) 12.5 MG capsule    Sig: TAKE 1 CAPSULE(12.5 MG) BY MOUTH DAILY    Dispense:  90 capsule    Refill:  3  . allopurinol (ZYLOPRIM) 100 MG tablet    Sig: Take 2 tablets (200 mg total) by mouth daily.    Dispense:  180 tablet    Refill:  3    Note new sig  . metoprolol succinate (TOPROL-XL) 25 MG 24 hr tablet    Sig: TAKE 1 TABLET BY MOUTH DAILY. TAKE WITH OR IMMEDIATELY FOLLOWING A MEAL    Dispense:  90 tablet    Refill:  3   Orders Placed This Encounter  Procedures  . POCT glycosylated hemoglobin (Hb A1C)    Patient instructions: If interested, check with pharmacy about new 2 shot shingles series (shingrix).  Work on living will, bring [place] a copy when updated.  Sugar was staying high - prediabetes range. Decrease added sugars. A1c today to see how average sugar has been running.  Decrease allopurinol to 200mg  daily - new dose at pharmacy  We will schedule another carotid ultrasound study.  Try nexium (esomeprazole) every other day.  BP elevated today - monitor at home and let me know if consistently >150/90.  Return in 6 months for follow up visit, sooner if needed  Follow up plan: Return in about 6 months (around 08/13/2019) for follow up visit.  Ria Bush, MD

## 2019-02-12 NOTE — Assessment & Plan Note (Signed)
Has established with local wound clinic

## 2019-02-12 NOTE — Assessment & Plan Note (Signed)
Managed by neurology

## 2019-02-12 NOTE — Assessment & Plan Note (Signed)
This has improved with decreased alcohol consumption.

## 2019-02-12 NOTE — Assessment & Plan Note (Signed)
Continue to encourage healthy diet and lifestyle changes to affect sustainable weight loss.  

## 2019-02-12 NOTE — Assessment & Plan Note (Signed)
Chronic ,elevated today. Advised he start monitoring BP at home and let me know if consistently >150/90 to titrate medication.

## 2019-02-12 NOTE — Progress Notes (Signed)
[name], [name] ([id number]) Visit Report for 02/12/2019 Arrival Information Details Patient Name: [name], [name] Date of Service: 02/12/2019 9:00 AM Medical Record Number: [id number] Patient Account Number: [id number] Date of Birth/Sex: 1931-09-19 (85 y.o. M) Treating RN: Cornell Barman Primary Care Tarez Bowns: Ria Bush Other Clinician: Referring Phu Record: Ria Bush Treating Ben Habermann/Extender: Tito Dine in Treatment: 2 Visit Information History Since Last Visit Added or deleted any medications: No Patient Arrived: Ambulatory Any new allergies or adverse reactions: No Arrival Time: 08:53 Had a fall or experienced change in No Accompanied By: self activities of daily living that may affect Transfer Assistance: None risk of falls: Patient Identification Verified: Yes Signs or symptoms of abuse/neglect since last No Secondary Verification Process Yes visito Completed: Hospitalized since last visit: No Patient Requires Transmission-Based No Implantable device outside of the clinic No Precautions: excluding Patient Has Alerts: Yes cellular tissue based products placed in the Patient Alerts: Patient on Blood center Thinner since last visit: Has Dressing in Place as Prescribed: Yes Has Footwear/Offloading in Place as Yes Prescribed: Left: Surgical Shoe with Pressure Relief Insole Pain Present Now: No Electronic Signature(s) Signed: 02/12/2019 5:19:46 PM By: Gretta Cool, BSN, RN, CWS, Kim RN, BSN Entered By: Gretta Cool, BSN, RN, CWS, Kim on 02/12/2019 08:54:35 [name] ([id number]) -------------------------------------------------------------------------------- Encounter Discharge Information Details Patient Name: [name] Date of Service: 02/12/2019 9:00 AM Medical Record Number: [id number] Patient Account Number: [id number] Date of Birth/Sex: 1932-05-06 (85 y.o. M) Treating RN: Cornell Barman Primary Care Jovie Swanner: Ria Bush Other  Clinician: Referring Floride Hutmacher: Ria Bush Treating Marigrace Mccole/Extender: Tito Dine in Treatment: 2 Encounter Discharge Information Items Post Procedure Vitals Discharge Condition: Stable Temperature (F): 97.9 Ambulatory Status: Ambulatory Pulse (bpm): 56 Discharge Destination: Home Respiratory Rate (breaths/min): 16 Transportation: Private Auto Blood Pressure (mmHg): 146/57 Accompanied By: self Schedule Follow-up Appointment: Yes Clinical Summary of Care: Electronic Signature(s) Signed: 02/12/2019 5:19:46 PM By: Gretta Cool, BSN, RN, CWS, Kim RN, BSN Entered By: Gretta Cool, BSN, RN, CWS, Kim on 02/12/2019 [place], [place] ([id number]) -------------------------------------------------------------------------------- Lower Extremity Assessment Details Patient Name: [name] Date of Service: 02/12/2019 9:00 AM Medical Record Number: [id number] Patient Account Number: [id number] Date of Birth/Sex: Aug 22, 1931 (85 y.o. M) Treating RN: Cornell Barman Primary Care Dwight Adamczak: Ria Bush Other Clinician: Referring Lashann Hagg: Ria Bush Treating Fallen Crisostomo/Extender: Tito Dine in Treatment: 2 Vascular Assessment Pulses: Dorsalis Pedis Palpable: [Left:Yes] Electronic Signature(s) Signed: 02/12/2019 5:19:46 PM By: Gretta Cool, BSN, RN, CWS, Kim RN, BSN Entered By: Gretta Cool, BSN, RN, CWS, Kim on 02/12/2019 08:58:40 [name] ([id number]) -------------------------------------------------------------------------------- Multi Wound Chart Details Patient Name: [name] Date of Service: 02/12/2019 9:00 AM Medical Record Number: [id number] Patient Account Number: [id number] Date of Birth/Sex: [birth year] (85 y.o. M) Treating RN: Cornell Barman Primary Care Averee Harb: Ria Bush Other Clinician: Referring Masaye Gatchalian: Ria Bush Treating Maninder Deboer/Extender: Tito Dine in Treatment: 2 Vital Signs Height(in): 69 Pulse(bpm):  56 Weight(lbs): 227 Blood Pressure(mmHg): 146/57 Body Mass Index(BMI): 34 Temperature(F): 97.9 Respiratory Rate 16 (breaths/min): Photos: [N/A:N/A] Wound Location: Left Toe Great N/A N/A Wounding Event: Blister N/A N/A Primary Etiology: Neuropathic Ulcer-Non N/A N/A Diabetic Comorbid History: Cataracts, Chronic sinus N/A N/A problems/congestion, Hypertension, History of Burn, Gout Date Acquired: 12/07/2018 N/A N/A Weeks of Treatment: 2 N/A N/A Wound Status: Open N/A N/A Measurements L x W x D 0.2x0.2x0.2 N/A N/A (cm) Area (cm) : 0.031 N/A N/A Volume (cm) : 0.006 N/A N/A % Reduction in Area: 90.10% N/A N/A % Reduction in Volume: 95.20% N/A N/A Starting Position 1 5 (o'clock): Ending Position  1 12 (o'clock): Maximum Distance 1 (cm): 0.2 Undermining: Yes N/A N/A Classification: Full Thickness Without N/A N/A Exposed Support Structures Exudate Amount: Medium N/A N/A Exudate Type: Serosanguineous N/A N/A Exudate Color: red, brown N/A N/A Wound Margin: Flat and Intact N/A N/A [name], [name] ([id number]) Granulation Amount: Large (67-100%) N/A N/A Granulation Quality: Pink N/A N/A Necrotic Amount: None Present (0%) N/A N/A Exposed Structures: Fat Layer (Subcutaneous N/A N/A Tissue) Exposed: Yes Fascia: No Tendon: No Muscle: No Joint: No Bone: No Epithelialization: Medium (34-66%) N/A N/A Debridement: Debridement - Selective/Open N/A N/A Wound Pre-procedure 09:01 N/A N/A Verification/Time Out Taken: Pain Control: Lidocaine N/A N/A Tissue Debrided: Callus N/A N/A Level: Skin/Dermis N/A N/A Debridement Area (sq cm): 0.04 N/A N/A Instrument: Curette N/A N/A Bleeding: Minimum N/A N/A Hemostasis Achieved: Pressure N/A N/A Debridement Treatment Procedure was tolerated well N/A N/A Response: Post Debridement 0.3x0.4x0.2 N/A N/A Measurements L x W x D (cm) Post Debridement Volume: 0.019 N/A N/A (cm) Procedures Performed: Debridement N/A N/A Treatment  Notes Wound #1 (Left Toe Great) Notes Prisma Ag, foam, conform, offloading shoe Electronic Signature(s) Signed: 02/12/2019 5:14:46 PM By: Linton Ham MD Entered By: Linton Ham on 02/12/2019 09:13:13 [name] ([id number]) -------------------------------------------------------------------------------- Multi-Disciplinary Care Plan Details Patient Name: [name] Date of Service: 02/12/2019 9:00 AM Medical Record Number: [id number] Patient Account Number: [id number] Date of Birth/Sex: 07-27-1931 (85 y.o. M) Treating RN: Cornell Barman Primary Care Jearld Hemp: Ria Bush Other Clinician: Referring Lukis Bunt: Ria Bush Treating Aristides Luckey/Extender: Tito Dine in Treatment: 2 Active Inactive Medication Nursing Diagnoses: Knowledge deficit related to medication safety: actual or potential Goals: Patient/caregiver will demonstrate understanding of all current medications Date Initiated: 01/29/2019 Target Resolution Date: 01/29/2019 Goal Status: Active Interventions: Patient/Caregiver given reconciled medication list upon admission, changes in medications and discharge from the [facility] Notes: Necrotic Tissue Nursing Diagnoses: Impaired tissue integrity related to necrotic/devitalized tissue Goals: Necrotic/devitalized tissue will be minimized in the wound bed Date Initiated: 02/12/2019 Target Resolution Date: 02/19/2019 Goal Status: Active Interventions: Assess patient pain level pre-, during and post procedure and prior to discharge Treatment Activities: Apply topical anesthetic as ordered : 02/12/2019 Notes: Orientation to the Wound Care Program Nursing Diagnoses: Knowledge deficit related to the wound healing center program Goals: Patient/caregiver will verbalize understanding of the [facility] Date Initiated: 01/29/2019 Target Resolution Date: 03/05/2019 [name], [name] ([id number]) Goal Status:  Active Interventions: Provide education on orientation to the wound center Notes: Wound/Skin Impairment Nursing Diagnoses: Knowledge deficit related to ulceration/compromised skin integrity Goals: Patient/caregiver will verbalize understanding of skin care regimen Date Initiated: 01/29/2019 Target Resolution Date: 02/05/2019 Goal Status: Active Ulcer/skin breakdown will have a volume reduction of 30% by week 4 Date Initiated: 01/29/2019 Target Resolution Date: 02/19/2019 Goal Status: Active Interventions: Assess patient/caregiver ability to obtain necessary supplies Assess ulceration(s) every visit Treatment Activities: Skin care regimen initiated : 01/29/2019 Notes: Electronic Signature(s) Signed: 02/12/2019 5:19:46 PM By: Gretta Cool, BSN, RN, CWS, Kim RN, BSN Entered By: Gretta Cool, BSN, RN, CWS, Kim on 02/12/2019 09:02:48 [name] ([id number]) -------------------------------------------------------------------------------- Pain Assessment Details Patient Name: [name] Date of Service: 02/12/2019 9:00 AM Medical Record Number: [id number] Patient Account Number: [id number] Date of Birth/Sex: Feb 12, 1932 (85 y.o. M) Treating RN: Cornell Barman Primary Care Adri Schloss: Ria Bush Other Clinician: Referring Khalfani Weideman: Ria Bush Treating Faisal Stradling/Extender: Tito Dine in Treatment: 2 Active Problems Location of Pain Severity and Description of Pain Patient Has Paino No Site Locations Pain Management and Medication Current Pain Management: Electronic Signature(s) Signed: 02/12/2019 5:19:46 PM  By: Gretta Cool, BSN, RN, CWS, Kim RN, BSN Entered By: Gretta Cool, BSN, RN, CWS, Kim on 02/12/2019 08:54:40 [name] ([id number]) -------------------------------------------------------------------------------- Patient/Caregiver Education Details Patient Name: [name] Date of Service: 02/12/2019 9:00 AM Medical Record Number: [id number] Patient Account Number:  [id number] Date of Birth/Gender: [birth year] (85 y.o. M) Treating RN: Cornell Barman Primary Care Physician: Ria Bush Other Clinician: Referring Physician: Ria Bush Treating Physician/Extender: Tito Dine in Treatment: 2 Education Assessment Education Provided To: Patient Education Topics Provided Wound/Skin Impairment: Handouts: Caring for Your Ulcer Methods: Demonstration, Explain/Verbal Responses: State content correctly Electronic Signature(s) Signed: 02/12/2019 5:19:46 PM By: Gretta Cool, BSN, RN, CWS, Kim RN, BSN Entered By: Gretta Cool, BSN, RN, CWS, Kim on 02/12/2019 09:05:46 [name] ([id number]) -------------------------------------------------------------------------------- Wound Assessment Details Patient Name: [name] Date of Service: 02/12/2019 9:00 AM Medical Record Number: [id number] Patient Account Number: [id number] Date of Birth/Sex: 29-Aug-1931 (85 y.o. M) Treating RN: Cornell Barman Primary Care Caroly Purewal: Ria Bush Other Clinician: Referring Jodiann Ognibene: Ria Bush Treating Lasonya Hubner/Extender: Tito Dine in Treatment: 2 Wound Status Wound Number: 1 Primary Neuropathic Ulcer-Non Diabetic Etiology: Wound Location: Left Toe Great Wound Open Wounding Event: Blister Status: Date Acquired: 12/07/2018 Comorbid Cataracts, Chronic sinus problems/congestion, Weeks Of Treatment: 2 History: Hypertension, History of Burn, Gout Clustered Wound: No Photos Wound Measurements Length: (cm) 0.2 Width: (cm) 0.2 Depth: (cm) 0.2 Area: (cm) 0.031 Volume: (cm) 0.006 % Reduction in Area: 90.1% % Reduction in Volume: 95.2% Epithelialization: Medium (34-66%) Undermining: Yes Starting Position (o'clock): 5 Ending Position (o'clock): 12 Maximum Distance: (cm) 0.2 Wound Description Full Thickness Without Exposed Support Classification: Structures Wound Margin: Flat and Intact Exudate Medium Amount: Exudate Type:  Serosanguineous Exudate Color: red, brown Foul Odor After Cleansing: No Slough/Fibrino Yes Wound Bed Granulation Amount: Large (67-100%) Exposed Structure Granulation Quality: Pink Fascia Exposed: No Necrotic Amount: None Present (0%) Fat Layer (Subcutaneous Tissue) Exposed: Yes Tendon Exposed: No Muscle Exposed: No [name], [name] ([id number]) Joint Exposed: No Bone Exposed: No Treatment Notes Wound #1 (Left Toe Great) Notes Prisma Ag, foam, conform, offloading shoe Electronic Signature(s) Signed: 02/12/2019 5:19:46 PM By: Gretta Cool, BSN, RN, CWS, Kim RN, BSN Entered By: Gretta Cool, BSN, RN, CWS, Kim on 02/12/2019 08:58:23 [name] ([id number]) -------------------------------------------------------------------------------- Vitals Details Patient Name: [name] Date of Service: 02/12/2019 9:00 AM Medical Record Number: [id number] Patient Account Number: [id number] Date of Birth/Sex: [birth year] (85 y.o. M) Treating RN: Cornell Barman Primary Care Faye Strohman: Ria Bush Other Clinician: Referring Pavan Bring: Ria Bush Treating Yuri Fana/Extender: Tito Dine in Treatment: 2 Vital Signs Time Taken: 08:54 Temperature (F): 97.9 Height (in): 69 Pulse (bpm): 56 Weight (lbs): 227 Respiratory Rate (breaths/min): 16 Body Mass Index (BMI): 33.5 Blood Pressure (mmHg): 146/57 Reference Range: 80 - 120 mg / dl Electronic Signature(s) Signed: 02/12/2019 5:19:46 PM By: Gretta Cool, BSN, RN, CWS, Kim RN, BSN Entered By: Gretta Cool, BSN, RN, CWS, Kim on 02/12/2019 08:55:42

## 2019-02-12 NOTE — Assessment & Plan Note (Signed)
Update A1c ?

## 2019-02-12 NOTE — Assessment & Plan Note (Signed)
Great control without recent flare - suggested decreasing allopurinol dose - will trial 200mg  daily.  Caution with HCTZ use.

## 2019-02-12 NOTE — Assessment & Plan Note (Signed)

## 2019-02-12 NOTE — Assessment & Plan Note (Signed)
Has significantly cut down.  ?

## 2019-02-12 NOTE — Assessment & Plan Note (Signed)
Advanced directive discussion - needs to update. Would want wife to be HCPOA. Packet provided today.

## 2019-02-12 NOTE — Assessment & Plan Note (Signed)
Chronic, off statin. Given age will not start. The ASCVD Risk score [name] DC Jr., et al., 2013) failed to calculate for the following reasons:   The 2013 ASCVD risk score is only valid for ages 42 to 74
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
## 2019-02-12 NOTE — Patient Instructions (Addendum)
If interested, check with pharmacy about new 2 shot shingles series (shingrix).  Work on living will, bring [place] a copy when updated.  Sugar was staying high - prediabetes range. Decrease added sugars. A1c today to see how average sugar has been running.  Decrease allopurinol to 200mg  daily - new dose at pharmacy  We will schedule another carotid ultrasound study.  Try nexium (esomeprazole) every other day.  BP elevated today - monitor at home and let me know if consistently >150/90.  Return in 6 months for follow up visit, sooner if needed  Health Maintenance After Age 83 After age 39, you are at a higher risk for certain long-term diseases and infections as well as injuries from falls. Falls are a major cause of broken bones and head injuries in people who are older than age 25. Getting regular preventive care can help to keep you healthy and well. Preventive care includes getting regular testing and making lifestyle changes as recommended by your health care provider. Talk with your health care provider about:  Which screenings and tests you should have. A screening is a test that checks for a disease when you have no symptoms.  A diet and exercise plan that is right for you. What should I know about screenings and tests to prevent falls? Screening and testing are the best ways to find a health problem early. Early diagnosis and treatment give you the best chance of managing medical conditions that are common after age 59. Certain conditions and lifestyle choices may make you more likely to have a fall. Your health care provider may recommend:  Regular vision checks. Poor vision and conditions such as cataracts can make you more likely to have a fall. If you wear glasses, make sure to get your prescription updated if your vision changes.  Medicine review. Work with your health care provider to regularly review all of the medicines you are taking, including over-the-counter medicines. Ask your  health care provider about any side effects that may make you more likely to have a fall. Tell your health care provider if any medicines that you take make you feel dizzy or sleepy.  Osteoporosis screening. Osteoporosis is a condition that causes the bones to get weaker. This can make the bones weak and cause them to break more easily.  Blood pressure screening. Blood pressure changes and medicines to control blood pressure can make you feel dizzy.  Strength and balance checks. Your health care provider may recommend certain tests to check your strength and balance while standing, walking, or changing positions.  Foot health exam. Foot pain and numbness, as well as not wearing proper footwear, can make you more likely to have a fall.  Depression screening. You may be more likely to have a fall if you have a fear of falling, feel emotionally low, or feel unable to do activities that you used to do.  Alcohol use screening. Using too much alcohol can affect your balance and may make you more likely to have a fall. What actions can I take to lower my risk of falls? General instructions  Talk with your health care provider about your risks for falling. Tell your health care provider if: ? You fall. Be sure to tell your health care provider about all falls, even ones that seem minor. ? You feel dizzy, sleepy, or off-balance.  Take over-the-counter and prescription medicines only as told by your health care provider. These include any supplements.  Eat a healthy diet and  maintain a healthy weight. A healthy diet includes low-fat dairy products, low-fat (lean) meats, and fiber from whole grains, beans, and lots of fruits and vegetables. Home safety  Remove any tripping hazards, such as rugs, cords, and clutter.  Install safety equipment such as grab bars in bathrooms and safety rails on stairs.  Keep rooms and walkways well-lit. Activity   Follow a regular exercise program to stay fit. This  will help you maintain your balance. Ask your health care provider what types of exercise are appropriate for you.  If you need a cane or walker, use it as recommended by your health care provider.  Wear supportive shoes that have nonskid soles. Lifestyle  Do not drink alcohol if your health care provider tells you not to drink.  If you drink alcohol, limit how much you have: ? 0-1 drink a day for women. ? 0-2 drinks a day for men.  Be aware of how much alcohol is in your drink. In the [place] one drink equals one typical bottle of beer (12 oz), one-half glass of wine (5 oz), or one shot of hard liquor (1 oz).  Do not use any products that contain nicotine or tobacco, such as cigarettes and e-cigarettes. If you need help quitting, ask your health care provider. Summary  Having a healthy lifestyle and getting preventive care can help to protect your health and wellness after age 76.  Screening and testing are the best way to find a health problem early and help you avoid having a fall. Early diagnosis and treatment give you the best chance for managing medical conditions that are more common for people who are older than age 68.  Falls are a major cause of broken bones and head injuries in people who are older than age 53. Take precautions to prevent a fall at home.  Work with your health care provider to learn what changes you can make to improve your health and wellness and to prevent falls. This information is not intended to replace advice given to you by your health care provider. Make sure you discuss any questions you have with your health care provider. Document Released: 03/07/2017 Document Revised: 08/15/2018 Document Reviewed: 03/07/2017 Elsevier Patient Education  2020 [employer].

## 2019-02-12 NOTE — Assessment & Plan Note (Signed)
Update carotid US.  

## 2019-02-12 NOTE — Assessment & Plan Note (Signed)
Will trial QOD nexium.

## 2019-02-17 DIAGNOSIS — M955 Acquired deformity of pelvis: Secondary | ICD-10-CM | POA: Diagnosis not present

## 2019-02-17 DIAGNOSIS — M5416 Radiculopathy, lumbar region: Secondary | ICD-10-CM | POA: Diagnosis not present

## 2019-02-17 DIAGNOSIS — M9905 Segmental and somatic dysfunction of pelvic region: Secondary | ICD-10-CM | POA: Diagnosis not present

## 2019-02-17 DIAGNOSIS — M9903 Segmental and somatic dysfunction of lumbar region: Secondary | ICD-10-CM | POA: Diagnosis not present

## 2019-02-19 ENCOUNTER — Encounter: Payer: Medicare Other | Admitting: Internal Medicine

## 2019-02-19 ENCOUNTER — Other Ambulatory Visit: Payer: Self-pay

## 2019-02-19 DIAGNOSIS — L97522 Non-pressure chronic ulcer of other part of left foot with fat layer exposed: Secondary | ICD-10-CM | POA: Diagnosis not present

## 2019-02-19 DIAGNOSIS — Z8614 Personal history of Methicillin resistant Staphylococcus aureus infection: Secondary | ICD-10-CM | POA: Diagnosis not present

## 2019-02-19 DIAGNOSIS — G9009 Other idiopathic peripheral autonomic neuropathy: Secondary | ICD-10-CM | POA: Diagnosis not present

## 2019-02-19 DIAGNOSIS — I10 Essential (primary) hypertension: Secondary | ICD-10-CM | POA: Diagnosis not present

## 2019-02-20 DIAGNOSIS — L820 Inflamed seborrheic keratosis: Secondary | ICD-10-CM | POA: Diagnosis not present

## 2019-02-20 DIAGNOSIS — L821 Other seborrheic keratosis: Secondary | ICD-10-CM | POA: Diagnosis not present

## 2019-02-20 DIAGNOSIS — L578 Other skin changes due to chronic exposure to nonionizing radiation: Secondary | ICD-10-CM | POA: Diagnosis not present

## 2019-02-20 DIAGNOSIS — L570 Actinic keratosis: Secondary | ICD-10-CM | POA: Diagnosis not present
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
## 2019-02-20 NOTE — Progress Notes (Signed)
[name], [name] ([id number]) Visit Report for 02/19/2019 Arrival Information Details Patient Name: [name], [name] Date of Service: 02/19/2019 3:30 PM Medical Record Number: [id number] Patient Account Number: [id number] Date of Birth/Sex: 17-Sep-1931 (85 y.o. M) Treating RN: Cornell Barman Primary Care Kendyll Huettner: Ria Bush Other Clinician: Referring Brent Taillon: Ria Bush Treating Emmogene Simson/Extender: Tito Dine in Treatment: 3 Visit Information History Since Last Visit Added or deleted any medications: No Patient Arrived: Ambulatory Any new allergies or adverse reactions: No Arrival Time: 15:28 Had a fall or experienced change in No Accompanied By: self activities of daily living that may affect Transfer Assistance: None risk of falls: Patient Identification Verified: Yes Signs or symptoms of abuse/neglect since last visito No Secondary Verification Process Yes Hospitalized since last visit: No Completed: Implantable device outside of the clinic excluding No Patient Requires Transmission-Based No cellular tissue based products placed in the center Precautions: since last visit: Patient Has Alerts: Yes Has Dressing in Place as Prescribed: Yes Patient Alerts: Patient on Blood Pain Present Now: No Thinner Electronic Signature(s) Signed: 02/19/2019 4:52:13 PM By: Lorine Bears RCP, RRT, CHT Entered By: Lorine Bears on 02/19/2019 15:29:58 [name] ([id number]) -------------------------------------------------------------------------------- Encounter Discharge Information Details Patient Name: [name] Date of Service: 02/19/2019 3:30 PM Medical Record Number: [id number] Patient Account Number: [id number] Date of Birth/Sex: Feb 14, 1932 (85 y.o. M) Treating RN: Cornell Barman Primary Care Beckey Polkowski: Ria Bush Other Clinician: Referring Genetta Fiero: Ria Bush Treating Minetta Krisher/Extender: Tito Dine  in Treatment: 3 Encounter Discharge Information Items Post Procedure Vitals Discharge Condition: Stable Temperature (F): 97.4 Ambulatory Status: Ambulatory Pulse (bpm): 57 Discharge Destination: Home Respiratory Rate (breaths/min): 16 Transportation: Private Auto Blood Pressure (mmHg): 146/61 Accompanied By: self Schedule Follow-up Appointment: Yes Clinical Summary of Care: Electronic Signature(s) Signed: 02/19/2019 5:44:53 PM By: Gretta Cool, BSN, RN, CWS, Kim RN, BSN Entered By: Gretta Cool, BSN, RN, CWS, Kim on 02/19/2019 16:05:36 [name] ([id number]) -------------------------------------------------------------------------------- Lower Extremity Assessment Details Patient Name: [name] Date of Service: 02/19/2019 3:30 PM Medical Record Number: [id number] Patient Account Number: [id number] Date of Birth/Sex: 1931-06-17 (85 y.o. M) Treating RN: Montey Hora Primary Care Jeffery Bachmeier: Ria Bush Other Clinician: Referring Shamell Suarez: Ria Bush Treating Randal Yepiz/Extender: Ricard Dillon Weeks in Treatment: 3 Edema Assessment Assessed: [Left: No] [Right: No] Edema: [Left: N] [Right: o] Vascular Assessment Pulses: Dorsalis Pedis Palpable: [Left:Yes] Electronic Signature(s) Signed: 02/19/2019 4:44:26 PM By: Montey Hora Entered By: Montey Hora on 02/19/2019 15:44:46 [name], [name] ([id number]) -------------------------------------------------------------------------------- Multi Wound Chart Details Patient Name: [name] Date of Service: 02/19/2019 3:30 PM Medical Record Number: [id number] Patient Account Number: [id number] Date of Birth/Sex: 1931/06/01 (85 y.o. M) Treating RN: Cornell Barman Primary Care Onix Jumper: Ria Bush Other Clinician: Referring Cuahutemoc Attar: Ria Bush Treating Raegan Winders/Extender: Tito Dine in Treatment: 3 Vital Signs Height(in): 69 Pulse(bpm): 76 Weight(lbs): 227 Blood Pressure(mmHg):  146/61 Body Mass Index(BMI): 34 Temperature(F): 97.4 Respiratory Rate 16 (breaths/min): Photos: [N/A:N/A] Wound Location: Left Toe Great N/A N/A Wounding Event: Blister N/A N/A Primary Etiology: Neuropathic Ulcer-Non N/A N/A Diabetic Comorbid History: Cataracts, Chronic sinus N/A N/A problems/congestion, Hypertension, History of Burn, Gout Date Acquired: 12/07/2018 N/A N/A Weeks of Treatment: 3 N/A N/A Wound Status: Open N/A N/A Measurements L x W x D 0.2x0.2x0.2 N/A N/A (cm) Area (cm) : 0.031 N/A N/A Volume (cm) : 0.006 N/A N/A % Reduction in Area: 90.10% N/A N/A % Reduction in Volume: 95.20% N/A N/A Classification: Full Thickness Without N/A N/A Exposed Support Structures Exudate Amount: Medium N/A N/A Exudate Type: Serosanguineous N/A N/A Exudate  Color: red, brown N/A N/A Wound Margin: Flat and Intact N/A N/A Granulation Amount: Large (67-100%) N/A N/A Granulation Quality: Pink N/A N/A Necrotic Amount: None Present (0%) N/A N/A Exposed Structures: Fat Layer (Subcutaneous N/A N/A Tissue) Exposed: Yes Fascia: No [name], [name] ([id number]) Tendon: No Muscle: No Joint: No Bone: No Epithelialization: Small (1-33%) N/A N/A Debridement: Debridement - Excisional N/A N/A Pre-procedure 16:00 N/A N/A Verification/Time Out Taken: Pain Control: Lidocaine N/A N/A Tissue Debrided: Callus, Subcutaneous N/A N/A Level: Skin/Subcutaneous Tissue N/A N/A Debridement Area (sq cm): 0.04 N/A N/A Instrument: Curette N/A N/A Bleeding: Minimum N/A N/A Hemostasis Achieved: Pressure N/A N/A Debridement Treatment Procedure was tolerated well N/A N/A Response: Post Debridement 0.2x0.2x0.2 N/A N/A Measurements L x W x D (cm) Post Debridement Volume: 0.006 N/A N/A (cm) Procedures Performed: Debridement N/A N/A Treatment Notes Wound #1 (Left Toe Great) Notes Prisma Ag, foam, conform, offloading shoe Electronic Signature(s) Signed: 02/19/2019 5:10:21 PM By: Linton Ham  MD Entered By: Linton Ham on 02/19/2019 16:45:35 [name] ([id number]) -------------------------------------------------------------------------------- Multi-Disciplinary Care Plan Details Patient Name: [name] Date of Service: 02/19/2019 3:30 PM Medical Record Number: [id number] Patient Account Number: [id number] Date of Birth/Sex: 18-Aug-1931 (85 y.o. M) Treating RN: Cornell Barman Primary Care Sowmya Partridge: Ria Bush Other Clinician: Referring Maleiah Dula: Ria Bush Treating Montell Leopard/Extender: Tito Dine in Treatment: 3 Active Inactive Medication Nursing Diagnoses: Knowledge deficit related to medication safety: actual or potential Goals: Patient/caregiver will demonstrate understanding of all current medications Date Initiated: 01/29/2019 Target Resolution Date: 01/29/2019 Goal Status: Active Interventions: Patient/Caregiver given reconciled medication list upon admission, changes in medications and discharge from the [place] Notes: Necrotic Tissue Nursing Diagnoses: Impaired tissue integrity related to necrotic/devitalized tissue Goals: Necrotic/devitalized tissue will be minimized in the wound bed Date Initiated: 02/12/2019 Target Resolution Date: 02/19/2019 Goal Status: Active Interventions: Assess patient pain level pre-, during and post procedure and prior to discharge Treatment Activities: Apply topical anesthetic as ordered : 02/12/2019 Notes: Orientation to the Wound Care Program Nursing Diagnoses: Knowledge deficit related to the wound healing center program Goals: Patient/caregiver will verbalize understanding of the [place] Program Date Initiated: 01/29/2019 Target Resolution Date: 03/05/2019 [name], [name] ([id number]) Goal Status: Active Interventions: Provide education on orientation to the wound center Notes: Wound/Skin Impairment Nursing Diagnoses: Knowledge deficit related to  ulceration/compromised skin integrity Goals: Patient/caregiver will verbalize understanding of skin care regimen Date Initiated: 01/29/2019 Target Resolution Date: 02/05/2019 Goal Status: Active Ulcer/skin breakdown will have a volume reduction of 30% by week 4 Date Initiated: 01/29/2019 Target Resolution Date: 02/19/2019 Goal Status: Active Interventions: Assess patient/caregiver ability to obtain necessary supplies Assess ulceration(s) every visit Treatment Activities: Skin care regimen initiated : 01/29/2019 Notes: Electronic Signature(s) Signed: 02/19/2019 5:44:53 PM By: Gretta Cool, BSN, RN, CWS, Kim RN, BSN Entered By: Gretta Cool, BSN, RN, CWS, Kim on 02/19/2019 16:01:54 [name] ([id number]) -------------------------------------------------------------------------------- Pain Assessment Details Patient Name: [name] Date of Service: 02/19/2019 3:30 PM Medical Record Number: [id number] Patient Account Number: [id number] Date of Birth/Sex: [birth year] (85 y.o. M) Treating RN: Cornell Barman Primary Care Hadassah Rana: Ria Bush Other Clinician: Referring Natanel Snavely: Ria Bush Treating Vernella Niznik/Extender: Tito Dine in Treatment: 3 Active Problems Location of Pain Severity and Description of Pain Patient Has Paino No Site Locations Pain Management and Medication Current Pain Management: Electronic Signature(s) Signed: 02/19/2019 4:52:13 PM By: Paulla Fore, RRT, CHT Signed: 02/19/2019 5:44:53 PM By: Gretta Cool, BSN, RN, CWS, Kim RN, BSN Entered By: Lorine Bears on 02/19/2019 15:30:03 [name], [name] ([id number]) --------------------------------------------------------------------------------  Patient/Caregiver Education Details Patient Name: [name], [name] Date of Service: 02/19/2019 3:30 PM Medical Record Number: [id number] Patient Account Number: [id number] Date of Birth/Gender: Mar 19, 1932 (85 y.o. M) Treating RN: Cornell Barman Primary Care Physician: Ria Bush Other Clinician: Referring Physician: Ria Bush Treating Physician/Extender: Tito Dine in Treatment: 3 Education Assessment Education Provided To: Patient Education Topics Provided Wound/Skin Impairment: Handouts: Caring for Your Ulcer Methods: Demonstration, Explain/Verbal Responses: State content correctly Electronic Signature(s) Signed: 02/19/2019 5:44:53 PM By: Gretta Cool, BSN, RN, CWS, Kim RN, BSN Entered By: Gretta Cool, BSN, RN, CWS, Kim on 02/19/2019 16:04:31 [name] ([id number]) -------------------------------------------------------------------------------- Wound Assessment Details Patient Name: [name] Date of Service: 02/19/2019 3:30 PM Medical Record Number: [id number] Patient Account Number: [id number] Date of Birth/Sex: 1931-11-15 (85 y.o. M) Treating RN: Montey Hora Primary Care Idell Hissong: Ria Bush Other Clinician: Referring Denay Pleitez: Ria Bush Treating Elica Almas/Extender: Tito Dine in Treatment: 3 Wound Status Wound Number: 1 Primary Neuropathic Ulcer-Non Diabetic Etiology: Wound Location: Left Toe Great Wound Open Wounding Event: Blister Status: Date Acquired: 12/07/2018 Comorbid Cataracts, Chronic sinus problems/congestion, Weeks Of Treatment: 3 History: Hypertension, History of Burn, Gout Clustered Wound: No Photos Wound Measurements Length: (cm) 0.2 Width: (cm) 0.2 Depth: (cm) 0.2 Area: (cm) 0.031 Volume: (cm) 0.006 % Reduction in Area: 90.1% % Reduction in Volume: 95.2% Epithelialization: Small (1-33%) Tunneling: No Undermining: No Wound Description Full Thickness Without Exposed Support Classification: Structures Wound Margin: Flat and Intact Exudate Medium Amount: Exudate Type: Serosanguineous Exudate Color: red, brown Foul Odor After Cleansing: No Slough/Fibrino Yes Wound Bed Granulation Amount: Large (67-100%) Exposed  Structure Granulation Quality: Pink Fascia Exposed: No Necrotic Amount: None Present (0%) Fat Layer (Subcutaneous Tissue) Exposed: Yes Tendon Exposed: No Muscle Exposed: No Joint Exposed: No Bone Exposed: No [name], [name] ([id number]) Treatment Notes Wound #1 (Left Toe Great) Notes Prisma Ag, foam, conform, offloading shoe Electronic Signature(s) Signed: 02/19/2019 4:44:26 PM By: Montey Hora Entered By: Montey Hora on 02/19/2019 15:44:33 Broder, Jef ([id number]) -------------------------------------------------------------------------------- Vitals Details Patient Name: [name] Date of Service: 02/19/2019 3:30 PM Medical Record Number: [id number] Patient Account Number: [id number] Date of Birth/Sex: [birth year] (85 y.o. M) Treating RN: Cornell Barman Primary Care Rashi Giuliani: Ria Bush Other Clinician: Referring Chloey Ricard: Ria Bush Treating Yama Nielson/Extender: Tito Dine in Treatment: 3 Vital Signs Time Taken: 15:30 Temperature (F): 97.4 Height (in): 69 Pulse (bpm): 57 Weight (lbs): 227 Respiratory Rate (breaths/min): 16 Body Mass Index (BMI): 33.5 Blood Pressure (mmHg): 146/61 Reference Range: 80 - 120 mg / dl Electronic Signature(s) Signed: 02/19/2019 4:52:13 PM By: Lorine Bears RCP, RRT, CHT Entered By: Lorine Bears on 02/19/2019 15:33:36

## 2019-02-26 ENCOUNTER — Other Ambulatory Visit: Payer: Self-pay

## 2019-02-26 ENCOUNTER — Encounter: Payer: Medicare Other | Admitting: Internal Medicine

## 2019-02-26 DIAGNOSIS — G9009 Other idiopathic peripheral autonomic neuropathy: Secondary | ICD-10-CM | POA: Diagnosis not present

## 2019-02-26 DIAGNOSIS — I10 Essential (primary) hypertension: Secondary | ICD-10-CM | POA: Diagnosis not present

## 2019-02-26 DIAGNOSIS — Z8614 Personal history of Methicillin resistant Staphylococcus aureus infection: Secondary | ICD-10-CM | POA: Diagnosis not present

## 2019-02-26 DIAGNOSIS — L97522 Non-pressure chronic ulcer of other part of left foot with fat layer exposed: Secondary | ICD-10-CM | POA: Diagnosis not present

## 2019-02-26 NOTE — Progress Notes (Signed)
[name], [name] ([id number]) Visit Report for 02/26/2019 Arrival Information Details Patient Name: [name], [name] Date of Service: 02/26/2019 9:00 AM Medical Record Number: [id number] Patient Account Number: [id number] Date of Birth/Sex: Apr 18, 1932 (85 y.o. M) Treating RN: Army Melia Primary Care [name]: [name] Other Clinician: Referring [name]: [name] Treating [name]/Extender: [name] in Treatment: 4 Visit Information History Since Last Visit Added or deleted any medications: No Patient Arrived: Ambulatory Any new allergies or adverse reactions: No Arrival Time: 09:03 Had a fall or experienced change in No Accompanied By: self activities of daily living that may affect Transfer Assistance: None risk of falls: Patient Identification Verified: Yes Signs or symptoms of abuse/neglect since last visito No Patient Requires Transmission-Based No Hospitalized since last visit: No Precautions: Has Dressing in Place as Prescribed: Yes Patient Has Alerts: Yes Pain Present Now: No Patient Alerts: Patient on Blood Thinner Electronic Signature(s) Signed: 02/26/2019 3:15:35 PM By: Army Melia Entered By: Army Melia on 02/26/2019 [place], [name] ([id number]) -------------------------------------------------------------------------------- Clinic Level of Care Assessment Details Patient Name: [name] Date of Service: 02/26/2019 9:00 AM Medical Record Number: [id number] Patient Account Number: [id number] Date of Birth/Sex: Jan 19, 1932 (85 y.o. M) Treating RN: [name] Primary Care [name]: [name] Other Clinician: Referring [name]: [name] Treating [name]/Extender: [name] in Treatment: 4 Clinic Level of Care Assessment Items TOOL 1 Quantity Score []  - Use when EandM and Procedure is performed on INITIAL visit 0 ASSESSMENTS - Nursing Assessment / Reassessment []  - General Physical  Exam (combine w/ comprehensive assessment (listed just below) when 0 performed on new pt. evals) []  - 0 Comprehensive Assessment (HX, ROS, Risk Assessments, Wounds Hx, etc.) ASSESSMENTS - Wound and Skin Assessment / Reassessment []  - Dermatologic / Skin Assessment (not related to wound area) 0 ASSESSMENTS - Ostomy and/or Continence Assessment and Care []  - Incontinence Assessment and Management 0 []  - 0 Ostomy Care Assessment and Management (repouching, etc.) PROCESS - Coordination of Care []  - Simple Patient / Family Education for ongoing care 0 []  - 0 Complex (extensive) Patient / Family Education for ongoing care []  - 0 Staff obtains Programmer, systems, Records, Test Results / Process Orders []  - 0 Staff telephones HHA, Nursing Homes / Clarify orders / etc []  - 0 Routine Transfer to another Facility (non-emergent condition) []  - 0 Routine Hospital Admission (non-emergent condition) []  - 0 New Admissions / Biomedical engineer / Ordering NPWT, Apligraf, etc. []  - 0 Emergency Hospital Admission (emergent condition) PROCESS - Special Needs []  - Pediatric / Minor Patient Management 0 []  - 0 Isolation Patient Management []  - 0 Hearing / Language / Visual special needs []  - 0 Assessment of Community assistance (transportation, D/C planning, etc.) []  - 0 Additional assistance / Altered mentation []  - 0 Support Surface(s) Assessment (bed, cushion, seat, etc.) [name], [name] ([id number]) INTERVENTIONS - Miscellaneous []  - External ear exam 0 []  - 0 Patient Transfer (multiple staff / Civil Service fast streamer / Similar devices) []  - 0 Simple Staple / Suture removal (25 or less) []  - 0 Complex Staple / Suture removal (26 or more) []  - 0 Hypo/Hyperglycemic Management (do not check if billed separately) []  - 0 Ankle / Brachial Index (ABI) - do not check if billed separately Has the patient been seen at the hospital within the last three years: Yes Total Score: 0 Level Of Care: ____ Electronic  Signature(s) Signed: 02/26/2019 4:31:46 PM By: [name], BSN, RN, CWS, Kim RN, BSN Entered By: [name], BSN, RN, CWS, [name] on 02/26/2019  09:24:48 [name], [name] ([id number]) -------------------------------------------------------------------------------- Encounter Discharge Information Details Patient Name: [name] Date of Service: 02/26/2019 9:00 AM Medical Record Number: [id number] Patient Account Number: [id number] Date of Birth/Sex: [birth year] (85 y.o. M) Treating RN: [name] Primary Care Eastyn Dattilo: [name] Other Clinician: Referring [name]: [name] Treating [name]/Extender: [name] in Treatment: 4 Encounter Discharge Information Items Post Procedure Vitals Discharge Condition: Stable Temperature (F): 97.8 Ambulatory Status: Ambulatory Pulse (bpm): 59 Discharge Destination: Home Respiratory Rate (breaths/min): 16 Transportation: Private Auto Blood Pressure (mmHg): 157/66 Accompanied By: self Schedule Follow-up Appointment: Yes Clinical Summary of Care: Electronic Signature(s) Signed: 02/26/2019 4:31:46 PM By: [name], BSN, RN, CWS, Kim RN, BSN Entered By: [name], BSN, RN, CWS, [name] on 02/26/2019 09:26:59 [name] ([id number]) -------------------------------------------------------------------------------- Lower Extremity Assessment Details Patient Name: [name] Date of Service: 02/26/2019 9:00 AM Medical Record Number: [id number] Patient Account Number: [id number] Date of Birth/Sex: 1931/05/22 (85 y.o. M) Treating RN: Army Melia Primary Care Rache Klimaszewski: [name] Other Clinician: Referring Jeriel Vivanco: [name] Treating Beyonca Wisz/Extender: [name] Weeks in Treatment: 4 Edema Assessment Assessed: [Left: No] [Right: No] Edema: [Left: N] [Right: o] Vascular Assessment Pulses: Dorsalis Pedis Palpable: [Left:Yes] Electronic Signature(s) Signed: 02/26/2019 3:15:35 PM By: Army Melia Entered By:  Army Melia on 02/26/2019 09:07:59 [name], [name] ([id number]) -------------------------------------------------------------------------------- Multi Wound Chart Details Patient Name: [name] Date of Service: 02/26/2019 9:00 AM Medical Record Number: [id number] Patient Account Number: [id number] Date of Birth/Sex: 23-Aug-1931 (85 y.o. M) Treating RN: [name] Primary Care Brigit Doke: [name] Other Clinician: Referring Timya Trimmer: [name] Treating Gregoria Selvy/Extender: [name] in Treatment: 4 Vital Signs Height(in): 69 Pulse(bpm): 77 Weight(lbs): 227 Blood Pressure(mmHg): 157/66 Body Mass Index(BMI): 34 Temperature(F): 97.8 Respiratory Rate 16 (breaths/min): Photos: [N/A:N/A] Wound Location: Left Toe Great N/A N/A Wounding Event: Blister N/A N/A Primary Etiology: Neuropathic Ulcer-Non N/A N/A Diabetic Comorbid History: Cataracts, Chronic sinus N/A N/A problems/congestion, Hypertension, History of Burn, Gout Date Acquired: 12/07/2018 N/A N/A Weeks of Treatment: 4 N/A N/A Wound Status: Open N/A N/A Measurements L x W x D 0.1x0.1x0.1 N/A N/A (cm) Area (cm) : 0.008 N/A N/A Volume (cm) : 0.001 N/A N/A % Reduction in Area: 97.50% N/A N/A % Reduction in Volume: 99.20% N/A N/A Classification: Full Thickness Without N/A N/A Exposed Support Structures Exudate Amount: Medium N/A N/A Exudate Type: Serosanguineous N/A N/A Exudate Color: red, brown N/A N/A Wound Margin: Flat and Intact N/A N/A Granulation Amount: Small (1-33%) N/A N/A Granulation Quality: Pink N/A N/A Necrotic Amount: Large (67-100%) N/A N/A Necrotic Tissue: Eschar N/A N/A Exposed Structures: Fat Layer (Subcutaneous N/A N/A Tissue) Exposed: [name], [name] ([id number]) Fascia: No Tendon: No Muscle: No Joint: No Bone: No Epithelialization: Small (1-33%) N/A N/A Treatment Notes Electronic Signature(s) Signed: 02/26/2019 4:31:46 PM By: [name], BSN, RN, CWS, Kim RN,  BSN Entered By: [name], BSN, RN, CWS, [name] on 02/26/2019 09:20:52 [name], [name] ([id number]) -------------------------------------------------------------------------------- Multi-Disciplinary Care Plan Details Patient Name: [name] Date of Service: 02/26/2019 9:00 AM Medical Record Number: [id number] Patient Account Number: [id number] Date of Birth/Sex: February 18, 1932 (85 y.o. M) Treating RN: [name] Primary Care Kamali Sakata: [name] Other Clinician: Referring Kou Gucciardo: [name] Treating Jasmon Graffam/Extender: [name] in Treatment: 4 Active Inactive Medication Nursing Diagnoses: Knowledge deficit related to medication safety: actual or potential Goals: Patient/caregiver will demonstrate understanding of all current medications Date Initiated: 01/29/2019 Target Resolution Date: 01/29/2019 Goal Status: Active Interventions: Patient/Caregiver given reconciled medication list upon admission, changes in medications and discharge from the [facility] Notes:  Necrotic Tissue Nursing Diagnoses: Impaired tissue integrity related to necrotic/devitalized tissue Goals: Necrotic/devitalized tissue will be minimized in the wound bed Date Initiated: 02/12/2019 Target Resolution Date: 02/19/2019 Goal Status: Active Interventions: Assess patient pain level pre-, during and post procedure and prior to discharge Treatment Activities: Apply topical anesthetic as ordered : 02/12/2019 Notes: Orientation to the Wound Care Program Nursing Diagnoses: Knowledge deficit related to the wound healing center program Goals: Patient/caregiver will verbalize understanding of the [facility] Date Initiated: 01/29/2019 Target Resolution Date: 03/05/2019 [name], [name] ([id number]) Goal Status: Active Interventions: Provide education on orientation to the wound center Notes: Wound/Skin Impairment Nursing Diagnoses: Knowledge deficit related to  ulceration/compromised skin integrity Goals: Patient/caregiver will verbalize understanding of skin care regimen Date Initiated: 01/29/2019 Target Resolution Date: 02/05/2019 Goal Status: Active Ulcer/skin breakdown will have a volume reduction of 30% by week 4 Date Initiated: 01/29/2019 Target Resolution Date: 02/19/2019 Goal Status: Active Interventions: Assess patient/caregiver ability to obtain necessary supplies Assess ulceration(s) every visit Treatment Activities: Skin care regimen initiated : 01/29/2019 Notes: Electronic Signature(s) Signed: 02/26/2019 4:31:46 PM By: [name], BSN, RN, CWS, Kim RN, BSN Entered By: [name], BSN, RN, CWS, [name] on 02/26/2019 09:20:37 [name] ([id number]) -------------------------------------------------------------------------------- Pain Assessment Details Patient Name: [name] Date of Service: 02/26/2019 9:00 AM Medical Record Number: [id number] Patient Account Number: [id number] Date of Birth/Sex: July 25, 1931 (85 y.o. M) Treating RN: Army Melia Primary Care Delaine Hernandez: [name] Other Clinician: Referring Khamora Karan: [name] Treating Caterin Tabares/Extender: [name] in Treatment: 4 Active Problems Location of Pain Severity and Description of Pain Patient Has Paino No Site Locations Pain Management and Medication Current Pain Management: Electronic Signature(s) Signed: 02/26/2019 3:15:35 PM By: Army Melia Entered By: Army Melia on 02/26/2019 09:04:17 [name] ([id number]) -------------------------------------------------------------------------------- Patient/Caregiver Education Details Patient Name: [name] Date of Service: 02/26/2019 9:00 AM Medical Record Number: [id number] Patient Account Number: [id number] Date of Birth/Gender: Jan 13, 1932 (85 y.o. M) Treating RN: [name] Primary Care Physician: [name] Other Clinician: Referring Physician: Ria Robles Treating  Physician/Extender: [name] in Treatment: 4 Education Assessment Education Provided To: Patient Education Topics Provided Wound/Skin Impairment: Handouts: Caring for Your Ulcer Methods: Demonstration, Explain/Verbal Responses: State content correctly Electronic Signature(s) Signed: 02/26/2019 4:31:46 PM By: [name], BSN, RN, CWS, Kim RN, BSN Entered By: [name], BSN, RN, CWS, [name] on 02/26/2019 09:25:21 [name] ([id number]) -------------------------------------------------------------------------------- Wound Assessment Details Patient Name: [name] Date of Service: 02/26/2019 9:00 AM Medical Record Number: [id number] Patient Account Number: [id number] Date of Birth/Sex: Jul 08, 1931 (85 y.o. M) Treating RN: Army Melia Primary Care Roylee Chaffin: [name] Other Clinician: Referring Stryker Veasey: [name] Treating Tip Atienza/Extender: [name] in Treatment: 4 Wound Status Wound Number: 1 Primary Neuropathic Ulcer-Non Diabetic Etiology: Wound Location: Left Toe Great Wound Open Wounding Event: Blister Status: Date Acquired: 12/07/2018 Comorbid Cataracts, Chronic sinus problems/congestion, Weeks Of Treatment: 4 History: Hypertension, History of Burn, Gout Clustered Wound: No Photos Wound Measurements Length: (cm) 0.1 Width: (cm) 0.1 Depth: (cm) 0.1 Area: (cm) 0.008 Volume: (cm) 0.001 % Reduction in Area: 97.5% % Reduction in Volume: 99.2% Epithelialization: Small (1-33%) Tunneling: No Undermining: No Wound Description Full Thickness Without Exposed Support Classification: Structures Wound Margin: Flat and Intact Exudate Medium Amount: Exudate Type: Serosanguineous Exudate Color: red, brown Foul Odor After Cleansing: No Slough/Fibrino Yes Wound Bed Granulation Amount: Small (1-33%) Exposed Structure Granulation Quality: Pink Fascia Exposed: No Necrotic Amount: Large (67-100%) Fat Layer (Subcutaneous Tissue)  Exposed: Yes Necrotic Quality: Eschar Tendon Exposed: No Muscle Exposed:  No Joint Exposed: No Bone Exposed: No [name], [name] ([id number]) Treatment Notes Wound #1 (Left Toe Great) Notes Silver Cell, foam, conform, offloading shoe Electronic Signature(s) Signed: 02/26/2019 3:15:35 PM By: Army Melia Entered By: Army Melia on 02/26/2019 09:07:31 [name], [name] ([id number]) -------------------------------------------------------------------------------- Vitals Details Patient Name: [name] Date of Service: 02/26/2019 9:00 AM Medical Record Number: [id number] Patient Account Number: [id number] Date of Birth/Sex: 1931-06-08 (85 y.o. M) Treating RN: Army Melia Primary Care Chino Sardo: [name] Other Clinician: Referring Nadene Witherspoon: [name] Treating Embry Huss/Extender: [name] in Treatment: 4 Vital Signs Time Taken: 09:04 Temperature (F): 97.8 Height (in): 69 Pulse (bpm): 59 Weight (lbs): 227 Respiratory Rate (breaths/min): 16 Body Mass Index (BMI): 33.5 Blood Pressure (mmHg): 157/66 Reference Range: 80 - 120 mg / dl Electronic Signature(s) Signed: 02/26/2019 3:15:35 PM By: Army Melia Entered By: Army Melia on 02/26/2019 09:04:54
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
## 2019-03-05 ENCOUNTER — Encounter: Payer: Medicare Other | Admitting: Internal Medicine

## 2019-03-05 ENCOUNTER — Other Ambulatory Visit: Payer: Self-pay

## 2019-03-05 DIAGNOSIS — Z8614 Personal history of Methicillin resistant Staphylococcus aureus infection: Secondary | ICD-10-CM | POA: Diagnosis not present

## 2019-03-05 DIAGNOSIS — L97522 Non-pressure chronic ulcer of other part of left foot with fat layer exposed: Secondary | ICD-10-CM | POA: Diagnosis not present

## 2019-03-05 DIAGNOSIS — G9009 Other idiopathic peripheral autonomic neuropathy: Secondary | ICD-10-CM | POA: Diagnosis not present

## 2019-03-05 DIAGNOSIS — I10 Essential (primary) hypertension: Secondary | ICD-10-CM | POA: Diagnosis not present
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
## 2019-03-06 NOTE — Progress Notes (Signed)
DRACEN, FLUHARTY ([id number]) Visit Report for 03/05/2019 Arrival Information Details Patient Name: [name], [name] Date of Service: 03/05/2019 9:00 AM Medical Record Number: [id number] Patient Account Number: [id number] Date of Birth/Sex: 1931/05/27 (85 y.o. M) Treating RN: Harold Barban Primary Care Ramelo Oetken: Ria Bush Other Clinician: Referring Ortencia Askari: Ria Bush Treating Bali Lyn/Extender: Tito Dine in Treatment: 5 Visit Information History Since Last Visit Added or deleted any medications: No Patient Arrived: Ambulatory Any new allergies or adverse reactions: No Arrival Time: 09:04 Had a fall or experienced change in No Accompanied By: self activities of daily living that may affect Transfer Assistance: None risk of falls: Patient Identification Verified: Yes Signs or symptoms of abuse/neglect since last No Secondary Verification Process Yes visito Completed: Has Dressing in Place as Prescribed: Yes Patient Requires Transmission-Based No Has Footwear/Offloading in Place as Yes Precautions: Prescribed: Patient Has Alerts: Yes Right: Surgical Shoe with Patient Alerts: Patient on Blood Pressure Relief Insole Thinner Pain Present Now: No Electronic Signature(s) Signed: 03/05/2019 4:41:01 PM By: Harold Barban Entered By: Harold Barban on 03/05/2019 09:05:42 [name] ([id number]) -------------------------------------------------------------------------------- Encounter Discharge Information Details Patient Name: [name] Date of Service: 03/05/2019 9:00 AM Medical Record Number: [id number] Patient Account Number: [id number] Date of Birth/Sex: [birth year] (85 y.o. M) Treating RN: Cornell Barman Primary Care Bryah Ocheltree: Ria Bush Other Clinician: Referring Christe Tellez: Ria Bush Treating Dorinda Stehr/Extender: Tito Dine in Treatment: 5 Encounter Discharge Information Items Post Procedure Vitals Discharge  Condition: Stable Temperature (F): 97.8 Ambulatory Status: Ambulatory Pulse (bpm): 57 Discharge Destination: Home Respiratory Rate (breaths/min): 16 Transportation: Private Auto Blood Pressure (mmHg): 134/55 Accompanied By: self Schedule Follow-up Appointment: Yes Clinical Summary of Care: Electronic Signature(s) Signed: 03/06/2019 10:04:30 AM By: Gretta Cool, BSN, RN, CWS, Kim RN, BSN Entered By: Gretta Cool, BSN, RN, CWS, Kim on 03/05/2019 09:50:24 [name] ([id number]) -------------------------------------------------------------------------------- Lower Extremity Assessment Details Patient Name: [name] Date of Service: 03/05/2019 9:00 AM Medical Record Number: [id number] Patient Account Number: [id number] Date of Birth/Sex: 1931/09/17 (85 y.o. M) Treating RN: Harold Barban Primary Care Laruth Hanger: Ria Bush Other Clinician: Referring Dejha King: Ria Bush Treating Irja Wheless/Extender: Ricard Dillon Weeks in Treatment: 5 Vascular Assessment Pulses: Dorsalis Pedis Palpable: [Left:Yes] Posterior Tibial Palpable: [Left:Yes] Electronic Signature(s) Signed: 03/05/2019 4:41:01 PM By: Harold Barban Entered By: Harold Barban on 03/05/2019 09:07:29 Melgarejo, Shelly Flatten ([id number]) -------------------------------------------------------------------------------- Multi Wound Chart Details Patient Name: [name] Date of Service: 03/05/2019 9:00 AM Medical Record Number: [id number] Patient Account Number: [id number] Date of Birth/Sex: 06-21-1931 (85 y.o. M) Treating RN: Cornell Barman Primary Care Shakeyla Giebler: Ria Bush Other Clinician: Referring Story Vanvranken: Ria Bush Treating Zaynab Chipman/Extender: Tito Dine in Treatment: 5 Vital Signs Height(in): 69 Pulse(bpm): 92 Weight(lbs): 227 Blood Pressure(mmHg): 134/55 Body Mass Index(BMI): 34 Temperature(F): 97.8 Respiratory Rate 16 (breaths/min): Photos: [N/A:N/A] Wound Location: Left  Toe Great N/A N/A Wounding Event: Blister N/A N/A Primary Etiology: Neuropathic Ulcer-Non N/A N/A Diabetic Comorbid History: Cataracts, Chronic sinus N/A N/A problems/congestion, Hypertension, History of Burn, Gout Date Acquired: 12/07/2018 N/A N/A Weeks of Treatment: 5 N/A N/A Wound Status: Open N/A N/A Measurements L x W x D 0.3x0.4x0.2 N/A N/A (cm) Area (cm) : 0.094 N/A N/A Volume (cm) : 0.019 N/A N/A % Reduction in Area: 70.10% N/A N/A % Reduction in Volume: 84.90% N/A N/A Classification: Full Thickness Without N/A N/A Exposed Support Structures Exudate Amount: Medium N/A N/A Exudate Type: Serosanguineous N/A N/A Exudate Color: red, brown N/A N/A Wound Margin: Flat and Intact N/A N/A Granulation Amount: Small (1-33%) N/A N/A Granulation Quality: Pink N/A N/A  Necrotic Amount: Large (67-100%) N/A N/A Necrotic Tissue: Eschar N/A N/A Exposed Structures: Fat Layer (Subcutaneous N/A N/A Tissue) Exposed: ROLLIE, CILLO ([id number]) Fascia: No Tendon: No Muscle: No Joint: No Bone: No Epithelialization: Small (1-33%) N/A N/A Debridement: Debridement - Excisional N/A N/A Pre-procedure 09:44 N/A N/A Verification/Time Out Taken: Pain Control: Lidocaine N/A N/A Tissue Debrided: Callus, Subcutaneous N/A N/A Level: Skin/Subcutaneous Tissue N/A N/A Debridement Area (sq cm): 0.12 N/A N/A Instrument: Curette N/A N/A Bleeding: Minimum N/A N/A Hemostasis Achieved: Pressure N/A N/A Debridement Treatment Procedure was tolerated well N/A N/A Response: Post Debridement 0.3x0.4x0.1 N/A N/A Measurements L x W x D (cm) Post Debridement Volume: 0.009 N/A N/A (cm) Procedures Performed: Debridement N/A N/A Treatment Notes Wound #1 (Left Toe Great) Notes Silver Cell, foam, conform, offloading shoe Electronic Signature(s) Signed: 03/05/2019 5:54:02 PM By: Linton Ham MD Entered By: Linton Ham on 03/05/2019 16:33:29 [name]  ([id number]) -------------------------------------------------------------------------------- Multi-Disciplinary Care Plan Details Patient Name: [name] Date of Service: 03/05/2019 9:00 AM Medical Record Number: [id number] Patient Account Number: [id number] Date of Birth/Sex: 08/30/1931 (85 y.o. M) Treating RN: Cornell Barman Primary Care Antoniette Peake: Ria Bush Other Clinician: Referring Breanna Mcdaniel: Ria Bush Treating Khaled Herda/Extender: Tito Dine in Treatment: 5 Active Inactive Medication Nursing Diagnoses: Knowledge deficit related to medication safety: actual or potential Goals: Patient/caregiver will demonstrate understanding of all current medications Date Initiated: 01/29/2019 Target Resolution Date: 01/29/2019 Goal Status: Active Interventions: Patient/Caregiver given reconciled medication list upon admission, changes in medications and discharge from the [place] Notes: Necrotic Tissue Nursing Diagnoses: Impaired tissue integrity related to necrotic/devitalized tissue Goals: Necrotic/devitalized tissue will be minimized in the wound bed Date Initiated: 02/12/2019 Target Resolution Date: 02/19/2019 Goal Status: Active Interventions: Assess patient pain level pre-, during and post procedure and prior to discharge Treatment Activities: Apply topical anesthetic as ordered : 02/12/2019 Notes: Orientation to the Wound Care Program Nursing Diagnoses: Knowledge deficit related to the wound healing center program Goals: Patient/caregiver will verbalize understanding of the [facility] Program Date Initiated: 01/29/2019 Target Resolution Date: 03/05/2019 LARRIS, POUSSON ([id number]) Goal Status: Active Interventions: Provide education on orientation to the wound center Notes: Wound/Skin Impairment Nursing Diagnoses: Knowledge deficit related to ulceration/compromised skin integrity Goals: Patient/caregiver will verbalize  understanding of skin care regimen Date Initiated: 01/29/2019 Target Resolution Date: 02/05/2019 Goal Status: Active Ulcer/skin breakdown will have a volume reduction of 30% by week 4 Date Initiated: 01/29/2019 Target Resolution Date: 02/19/2019 Goal Status: Active Interventions: Assess patient/caregiver ability to obtain necessary supplies Assess ulceration(s) every visit Treatment Activities: Skin care regimen initiated : 01/29/2019 Notes: Electronic Signature(s) Signed: 03/06/2019 10:04:30 AM By: Gretta Cool, BSN, RN, CWS, Kim RN, BSN Entered By: Gretta Cool, BSN, RN, CWS, Kim on 03/05/2019 09:45:52 [name] ([id number]) -------------------------------------------------------------------------------- Pain Assessment Details Patient Name: [name] Date of Service: 03/05/2019 9:00 AM Medical Record Number: [id number] Patient Account Number: [id number] Date of Birth/Sex: [birth year] (85 y.o. M) Treating RN: Harold Barban Primary Care Kamla Skilton: Ria Bush Other Clinician: Referring Hershall Benkert: Ria Bush Treating Janiesha Diehl/Extender: Tito Dine in Treatment: 5 Active Problems Location of Pain Severity and Description of Pain Patient Has Paino No Site Locations Pain Management and Medication Current Pain Management: Electronic Signature(s) Signed: 03/05/2019 4:41:01 PM By: Harold Barban Entered By: Harold Barban on 03/05/2019 09:06:17 [name] ([id number]) -------------------------------------------------------------------------------- Patient/Caregiver Education Details Patient Name: [name] Date of Service: 03/05/2019 9:00 AM Medical Record Number: [id number] Patient Account Number: [id number] Date of Birth/Gender: 03/22/1932 (85 y.o. M) Treating RN: Cornell Barman Primary Care Physician: Danise Mina  JAVIER Other Clinician: Referring Physician: Ria Bush Treating Physician/Extender: Tito Dine in Treatment:  5 Education Assessment Education Provided To: Patient Education Topics Provided Wound/Skin Impairment: Handouts: Caring for Your Ulcer Methods: Demonstration, Explain/Verbal Responses: State content correctly Electronic Signature(s) Signed: 03/06/2019 10:04:30 AM By: Gretta Cool, BSN, RN, CWS, Kim RN, BSN Entered By: Gretta Cool, BSN, RN, CWS, Kim on 03/05/2019 09:49:08 [name] ([id number]) -------------------------------------------------------------------------------- Wound Assessment Details Patient Name: [name] Date of Service: 03/05/2019 9:00 AM Medical Record Number: [id number] Patient Account Number: [id number] Date of Birth/Sex: 1932/04/05 (85 y.o. M) Treating RN: Cornell Barman Primary Care Nealy Karapetian: Ria Bush Other Clinician: Referring Loretto Belinsky: Ria Bush Treating Kieana Livesay/Extender: Tito Dine in Treatment: 5 Wound Status Wound Number: 1 Primary Neuropathic Ulcer-Non Diabetic Etiology: Wound Location: Left Toe Great Wound Open Wounding Event: Blister Status: Date Acquired: 12/07/2018 Comorbid Cataracts, Chronic sinus problems/congestion, Weeks Of Treatment: 5 History: Hypertension, History of Burn, Gout Clustered Wound: No Photos Wound Measurements Length: (cm) 0.3 Width: (cm) 0.4 Depth: (cm) 0.2 Area: (cm) 0.094 Volume: (cm) 0.019 % Reduction in Area: 70.1% % Reduction in Volume: 84.9% Epithelialization: Small (1-33%) Tunneling: No Undermining: No Wound Description Full Thickness Without Exposed Support Classification: Structures Wound Margin: Flat and Intact Exudate Medium Amount: Exudate Type: Serosanguineous Exudate Color: red, brown Foul Odor After Cleansing: No Slough/Fibrino Yes Wound Bed Granulation Amount: Small (1-33%) Exposed Structure Granulation Quality: Pink Fascia Exposed: No Necrotic Amount: Large (67-100%) Fat Layer (Subcutaneous Tissue) Exposed: Yes Necrotic Quality: Eschar Tendon Exposed:  No Muscle Exposed: No Joint Exposed: No Bone Exposed: No Kirchman, Trice ([id number]) Treatment Notes Wound #1 (Left Toe Great) Notes Silver Cell, foam, conform, offloading shoe Electronic Signature(s) Signed: 03/06/2019 10:04:30 AM By: Gretta Cool, BSN, RN, CWS, Kim RN, BSN Entered By: Gretta Cool, BSN, RN, CWS, Kim on 03/05/2019 09:45:31 [name] ([id number]) -------------------------------------------------------------------------------- Vitals Details Patient Name: [name] Date of Service: 03/05/2019 9:00 AM Medical Record Number: [id number] Patient Account Number: [id number] Date of Birth/Sex: 1932-05-06 (85 y.o. M) Treating RN: Harold Barban Primary Care Ranessa Kosta: Ria Bush Other Clinician: Referring Chiyeko Ferre: Ria Bush Treating Sacred Roa/Extender: Tito Dine in Treatment: 5 Vital Signs Time Taken: 09:05 Temperature (F): 97.8 Height (in): 69 Pulse (bpm): 57 Weight (lbs): 227 Respiratory Rate (breaths/min): 16 Body Mass Index (BMI): 33.5 Blood Pressure (mmHg): 134/55 Reference Range: 80 - 120 mg / dl Electronic Signature(s) Signed: 03/05/2019 4:41:01 PM By: Harold Barban Entered By: Harold Barban on 03/05/2019 09:06:39

## 2019-03-10 DIAGNOSIS — M9903 Segmental and somatic dysfunction of lumbar region: Secondary | ICD-10-CM | POA: Diagnosis not present

## 2019-03-10 DIAGNOSIS — M955 Acquired deformity of pelvis: Secondary | ICD-10-CM | POA: Diagnosis not present

## 2019-03-10 DIAGNOSIS — M5416 Radiculopathy, lumbar region: Secondary | ICD-10-CM | POA: Diagnosis not present

## 2019-03-10 DIAGNOSIS — M9905 Segmental and somatic dysfunction of pelvic region: Secondary | ICD-10-CM | POA: Diagnosis not present

## 2019-03-12 ENCOUNTER — Encounter: Payer: Medicare Other | Attending: Internal Medicine | Admitting: Internal Medicine

## 2019-03-12 ENCOUNTER — Other Ambulatory Visit: Payer: Self-pay

## 2019-03-12 DIAGNOSIS — G9009 Other idiopathic peripheral autonomic neuropathy: Secondary | ICD-10-CM | POA: Diagnosis not present

## 2019-03-12 DIAGNOSIS — L97521 Non-pressure chronic ulcer of other part of left foot limited to breakdown of skin: Secondary | ICD-10-CM | POA: Diagnosis not present

## 2019-03-12 DIAGNOSIS — I10 Essential (primary) hypertension: Secondary | ICD-10-CM | POA: Diagnosis not present

## 2019-03-12 DIAGNOSIS — Z8614 Personal history of Methicillin resistant Staphylococcus aureus infection: Secondary | ICD-10-CM | POA: Diagnosis not present

## 2019-03-12 DIAGNOSIS — M109 Gout, unspecified: Secondary | ICD-10-CM | POA: Diagnosis not present

## 2019-03-12 DIAGNOSIS — L97529 Non-pressure chronic ulcer of other part of left foot with unspecified severity: Secondary | ICD-10-CM | POA: Diagnosis present

## 2019-03-14 NOTE — Progress Notes (Signed)
[name] ([id number]) Visit Report for 03/12/2019 Arrival Information Details Patient Name: [name] Date of Service: 03/12/2019 9:00 AM Medical Record Number: [id number] Patient Account Number: [id number] Date of Birth/Sex: June 17, 1931 (85 y.o. M) Treating RN: [name] Primary Care [name]: [name] Other Clinician: Referring [name]: [name] Treating [name]/Extender: [name] in Treatment: 6 Visit Information History Since Last Visit Added or deleted any medications: No Patient Arrived: Ambulatory Any new allergies or adverse reactions: No Arrival Time: 08:53 Had a fall or experienced change in No Accompanied By: self activities of daily living that may affect Transfer Assistance: None risk of falls: Patient Identification Verified: Yes Signs or symptoms of abuse/neglect since last visito No Secondary Verification Process Yes Hospitalized since last visit: No Completed: Has Dressing in Place as Prescribed: Yes Patient Requires Transmission-Based No Pain Present Now: No Precautions: Patient Has Alerts: Yes Patient Alerts: Patient on Blood Thinner Electronic Signature(s) Signed: 03/13/2019 4:15:06 PM By: [name] Entered By: [name] on 03/12/2019 08:54:37 [name] ([id number]) -------------------------------------------------------------------------------- Encounter Discharge Information Details Patient Name: [name] Date of Service: 03/12/2019 9:00 AM Medical Record Number: [id number] Patient Account Number: [id number] Date of Birth/Sex: Dec 08, 1931 (85 y.o. M) Treating RN: [name] Primary Care [name]: [name] Other Clinician: Referring Dinah Lupa: Ria Robles Treating Laterria Lasota/Extender: [name] in Treatment: 6 Encounter Discharge Information Items Post Procedure Vitals Discharge Condition: Stable Temperature (F): 97.7 Ambulatory Status:  Ambulatory Pulse (bpm): 57 Discharge Destination: Home Respiratory Rate (breaths/min): 18 Transportation: Private Auto Blood Pressure (mmHg): 149/60 Accompanied By: self Schedule Follow-up Appointment: Yes Clinical Summary of Care: Electronic Signature(s) Signed: 03/13/2019 5:19:38 PM By: [name], BSN, RN, CWS, Kim RN, BSN Entered By: [name], BSN, RN, CWS, [name] on 03/12/2019 09:43:40 [name] ([id number]) -------------------------------------------------------------------------------- Lower Extremity Assessment Details Patient Name: [name] Date of Service: 03/12/2019 9:00 AM Medical Record Number: [id number] Patient Account Number: [id number] Date of Birth/Sex: 19-May-1931 (85 y.o. M) Treating RN: [name] Primary Care [name]: [name] Other Clinician: Referring [name]: [name] Treating Kolin Erdahl/Extender: [name] Weeks in Treatment: 6 Vascular Assessment Pulses: Dorsalis Pedis Palpable: [Left:Yes] Posterior Tibial Palpable: [Left:Yes] Electronic Signature(s) Signed: 03/13/2019 4:15:06 PM By: [name] Entered By: [name] on 03/12/2019 08:55:49 Cutler, Shiro ([id number]) -------------------------------------------------------------------------------- Multi Wound Chart Details Patient Name: [name] Date of Service: 03/12/2019 9:00 AM Medical Record Number: [id number] Patient Account Number: [id number] Date of Birth/Sex: [birth year] (85 y.o. M) Treating RN: [name] Primary Care [name]: [name] Other Clinician: Referring [name]: [name] Treating [name]/Extender: [name] in Treatment: 6 Vital Signs Height(in): 69 Pulse(bpm): 34 Weight(lbs): 227 Blood Pressure(mmHg): 149/60 Body Mass Index(BMI): 34 Temperature(F): 97.7 Respiratory Rate 18 (breaths/min): Photos: [N/A:N/A] Wound Location: Left Toe Great N/A N/A Wounding Event: Blister N/A N/A Primary  Etiology: Neuropathic Ulcer-Non N/A N/A Diabetic Comorbid History: Cataracts, Chronic sinus N/A N/A problems/congestion, Hypertension, History of Burn, Gout Date Acquired: 12/07/2018 N/A N/A Weeks of Treatment: 6 N/A N/A Wound Status: Open N/A N/A Measurements L x W x D 0.1x0.1x0.1 N/A N/A (cm) Area (cm) : 0.008 N/A N/A Volume (cm) : 0.001 N/A N/A % Reduction in Area: 97.50% N/A N/A % Reduction in Volume: 99.20% N/A N/A Classification: Full Thickness Without N/A N/A Exposed Support Structures Exudate Amount: None Present N/A N/A Wound Margin: Flat and Intact N/A N/A Granulation Amount: Small (1-33%) N/A N/A Granulation Quality: Pink N/A N/A Necrotic Amount: Large (67-100%) N/A N/A Necrotic Tissue: Eschar N/A N/A Exposed Structures: Fascia: No N/A N/A Fat Layer (  Subcutaneous Tissue) Exposed: No Tendon: No [name] ([id number]) Muscle: No Joint: No Bone: No Epithelialization: Small (1-33%) N/A N/A Debridement: Debridement - Selective/Open N/A N/A Wound Pre-procedure 09:37 N/A N/A Verification/Time Out Taken: Pain Control: Lidocaine N/A N/A Tissue Debrided: Callus N/A N/A Level: Non-Viable Tissue N/A N/A Debridement Area (sq cm): 0.09 N/A N/A Instrument: Curette N/A N/A Bleeding: None N/A N/A Debridement Treatment Procedure was tolerated well N/A N/A Response: Post Debridement 0.3x0.3x0.1 N/A N/A Measurements L x W x D (cm) Post Debridement Volume: 0.007 N/A N/A (cm) Procedures Performed: Debridement N/A N/A Treatment Notes Wound #1 (Left Toe Great) Notes Silver Cell, foam, conform, offloading shoe Electronic Signature(s) Signed: 03/14/2019 7:47:21 AM By: Paul Ham MD Entered By: [name] on 03/12/2019 10:12:58 [name] ([id number]) -------------------------------------------------------------------------------- Multi-Disciplinary Care Plan Details Patient Name: [name] Date of Service: 03/12/2019 9:00 AM Medical Record  Number: [id number] Patient Account Number: [id number] Date of Birth/Sex: 04-Nov-1931 (85 y.o. M) Treating RN: [name] Primary Care [name]: [name] Other Clinician: Referring [name]: [name] Treating [name]/Extender: [name] in Treatment: 6 Active Inactive Medication Nursing Diagnoses: Knowledge deficit related to medication safety: actual or potential Goals: Patient/caregiver will demonstrate understanding of all current medications Date Initiated: 01/29/2019 Target Resolution Date: 01/29/2019 Goal Status: Active Interventions: Patient/Caregiver given reconciled medication list upon admission, changes in medications and discharge from the [facility] Notes: Necrotic Tissue Nursing Diagnoses: Impaired tissue integrity related to necrotic/devitalized tissue Goals: Necrotic/devitalized tissue will be minimized in the wound bed Date Initiated: 02/12/2019 Target Resolution Date: 02/19/2019 Goal Status: Active Interventions: Assess patient pain level pre-, during and post procedure and prior to discharge Treatment Activities: Apply topical anesthetic as ordered : 02/12/2019 Notes: Orientation to the Wound Care Program Nursing Diagnoses: Knowledge deficit related to the wound healing center program Goals: Patient/caregiver will verbalize understanding of the [facility] Program Date Initiated: 01/29/2019 Target Resolution Date: 03/05/2019 JANET, SZWED ([id number]) Goal Status: Active Interventions: Provide education on orientation to the wound center Notes: Wound/Skin Impairment Nursing Diagnoses: Knowledge deficit related to ulceration/compromised skin integrity Goals: Patient/caregiver will verbalize understanding of skin care regimen Date Initiated: 01/29/2019 Target Resolution Date: 02/05/2019 Goal Status: Active Ulcer/skin breakdown will have a volume reduction of 30% by week 4 Date Initiated: 01/29/2019 Target  Resolution Date: 02/19/2019 Goal Status: Active Interventions: Assess patient/caregiver ability to obtain necessary supplies Assess ulceration(s) every visit Treatment Activities: Skin care regimen initiated : 01/29/2019 Notes: Electronic Signature(s) Signed: 03/13/2019 5:19:38 PM By: [name], BSN, RN, CWS, Kim RN, BSN Entered By: [name], BSN, RN, CWS, [name] on 03/12/2019 09:39:51 [name] ([id number]) -------------------------------------------------------------------------------- Pain Assessment Details Patient Name: [name] Date of Service: 03/12/2019 9:00 AM Medical Record Number: [id number] Patient Account Number: [id number] Date of Birth/Sex: Apr 01, 1932 (85 y.o. M) Treating RN: [name] Primary Care Arabia Nylund: [name] Other Clinician: Referring Marybelle Giraldo: [name] Treating Decorey Wahlert/Extender: [name] in Treatment: 6 Active Problems Location of Pain Severity and Description of Pain Patient Has Paino No Site Locations Pain Management and Medication Current Pain Management: Electronic Signature(s) Signed: 03/13/2019 4:15:06 PM By: [name] Entered By: [name] on 03/12/2019 08:54:48 [name] ([id number]) -------------------------------------------------------------------------------- Patient/Caregiver Education Details Patient Name: [name] Date of Service: 03/12/2019 9:00 AM Medical Record Number: [id number] Patient Account Number: [id number] Date of Birth/Gender: Oct 17, 1931 (85 y.o. M) Treating RN: [name] Primary Care Physician: [name] Other Clinician: Referring Physician: Ria Robles Treating Physician/Extender: [name] in Treatment: 6 Education Assessment Education Provided To: Patient Education Topics  Provided Wound/Skin Impairment: Handouts: Caring for Your Ulcer Methods: Demonstration, Explain/Verbal Responses: State content correctly Electronic  Signature(s) Signed: 03/13/2019 5:19:38 PM By: [name], BSN, RN, CWS, Kim RN, BSN Entered By: [name], BSN, RN, CWS, [name] on 03/12/2019 09:42:39 [name] ([id number]) -------------------------------------------------------------------------------- Wound Assessment Details Patient Name: [name] Date of Service: 03/12/2019 9:00 AM Medical Record Number: [id number] Patient Account Number: [id number] Date of Birth/Sex: Oct 25, 1931 (85 y.o. M) Treating RN: [name] Primary Care Copeland Lapier: [name] Other Clinician: Referring Ailene Royal: [name] Treating Jaron Czarnecki/Extender: [name] in Treatment: 6 Wound Status Wound Number: 1 Primary Neuropathic Ulcer-Non Diabetic Etiology: Wound Location: Left Toe Great Wound Open Wounding Event: Blister Status: Date Acquired: 12/07/2018 Comorbid Cataracts, Chronic sinus problems/congestion, Weeks Of Treatment: 6 History: Hypertension, History of Burn, Gout Clustered Wound: No Photos Wound Measurements Length: (cm) 0.1 Width: (cm) 0.1 Depth: (cm) 0.1 Area: (cm) 0.008 Volume: (cm) 0.001 % Reduction in Area: 97.5% % Reduction in Volume: 99.2% Epithelialization: Small (1-33%) Tunneling: No Undermining: No Wound Description Full Thickness Without Exposed Support Classification: Structures Wound Margin: Flat and Intact Exudate None Present Amount: Foul Odor After Cleansing: No Slough/Fibrino No Wound Bed Granulation Amount: Small (1-33%) Exposed Structure Granulation Quality: Pink Fascia Exposed: No Necrotic Amount: Large (67-100%) Fat Layer (Subcutaneous Tissue) Exposed: No Necrotic Quality: Eschar Tendon Exposed: No Muscle Exposed: No Joint Exposed: No Bone Exposed: No Treatment Notes Wound #1 (Left [address] YUVAL, MAHAJAN ([id number]) Notes Silver Cell, foam, conform, offloading shoe Electronic Signature(s) Signed: 03/13/2019 4:15:06 PM By: [name] Entered By: [name] on 03/12/2019 08:59:18 Gemme, Shelly Flatten ([id number]) -------------------------------------------------------------------------------- Vitals Details Patient Name: [name] Date of Service: 03/12/2019 9:00 AM Medical Record Number: [id number] Patient Account Number: [id number] Date of Birth/Sex: December 03, 1931 (85 y.o. M) Treating RN: [name] Primary Care Bassheva Flury: [name] Other Clinician: Referring Emmet Messer: [name] Treating Bana Borgmeyer/Extender: [name] in Treatment: 6 Vital Signs Time Taken: 08:54 Temperature (F): 97.7 Height (in): 69 Pulse (bpm): 57 Weight (lbs): 227 Respiratory Rate (breaths/min): 18 Body Mass Index (BMI): 33.5 Blood Pressure (mmHg): 149/60 Reference Range: 80 - 120 mg / dl Electronic Signature(s) Signed: 03/13/2019 4:15:06 PM By: [name] Entered By: [name] on 03/12/2019 08:57:01
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
## 2019-03-17 ENCOUNTER — Other Ambulatory Visit: Payer: Self-pay

## 2019-03-17 ENCOUNTER — Ambulatory Visit (INDEPENDENT_AMBULATORY_CARE_PROVIDER_SITE_OTHER): Payer: Medicare Other

## 2019-03-17 DIAGNOSIS — I6523 Occlusion and stenosis of bilateral carotid arteries: Secondary | ICD-10-CM

## 2019-03-19 ENCOUNTER — Other Ambulatory Visit: Payer: Self-pay

## 2019-03-19 ENCOUNTER — Encounter: Payer: Medicare Other | Admitting: Internal Medicine

## 2019-03-19 DIAGNOSIS — S90422A Blister (nonthermal), left great toe, initial encounter: Secondary | ICD-10-CM | POA: Diagnosis not present

## 2019-03-19 DIAGNOSIS — I10 Essential (primary) hypertension: Secondary | ICD-10-CM | POA: Diagnosis not present

## 2019-03-19 DIAGNOSIS — M109 Gout, unspecified: Secondary | ICD-10-CM | POA: Diagnosis not present

## 2019-03-19 DIAGNOSIS — G9009 Other idiopathic peripheral autonomic neuropathy: Secondary | ICD-10-CM | POA: Diagnosis not present

## 2019-03-19 DIAGNOSIS — Z8614 Personal history of Methicillin resistant Staphylococcus aureus infection: Secondary | ICD-10-CM | POA: Diagnosis not present

## 2019-03-19 DIAGNOSIS — L97521 Non-pressure chronic ulcer of other part of left foot limited to breakdown of skin: Secondary | ICD-10-CM | POA: Diagnosis not present
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
## 2019-03-20 NOTE — Progress Notes (Signed)
BRAIDYN, ROTMAN ([id number]) Visit Report for 03/19/2019 Arrival Information Details Patient Name: [name], [name] Date of Service: 03/19/2019 9:00 AM Medical Record Number: [id number] Patient Account Number: [id number] Date of Birth/Sex: [birth year] (85 y.o. M) Treating RN: Army Melia Primary Care Xaiver Roskelley: Ria Bush Other Clinician: Referring Miner Koral: Ria Bush Treating Ruhama Lehew/Extender: Tito Dine in Treatment: 7 Visit Information History Since Last Visit Added or deleted any medications: No Patient Arrived: Ambulatory Any new allergies or adverse reactions: No Arrival Time: 08:55 Had a fall or experienced change in No Accompanied By: self activities of daily living that may affect Transfer Assistance: None risk of falls: Patient Identification Verified: Yes Signs or symptoms of abuse/neglect since last visito No Patient Requires Transmission-Based No Hospitalized since last visit: No Precautions: Has Dressing in Place as Prescribed: Yes Patient Has Alerts: Yes Pain Present Now: No Patient Alerts: Patient on Blood Thinner Electronic Signature(s) Signed: 03/20/2019 3:26:34 PM By: Army Melia Entered By: Army Melia on 03/19/2019 08:55:45 [name], [name] ([id number]) -------------------------------------------------------------------------------- Clinic Level of Care Assessment Details Patient Name: [name] Date of Service: 03/19/2019 9:00 AM Medical Record Number: [id number] Patient Account Number: [id number] Date of Birth/Sex: 12/07/1931 (85 y.o. M) Treating RN: Cornell Barman Primary Care Alexandrina Fiorini: Ria Bush Other Clinician: Referring Corneshia Hines: Ria Bush Treating Josha Weekley/Extender: Tito Dine in Treatment: 7 Clinic Level of Care Assessment Items TOOL 4 Quantity Score []  - Use when only an EandM is performed on FOLLOW-UP visit 0 ASSESSMENTS - Nursing Assessment / Reassessment X - Reassessment of  Co-morbidities (includes updates in patient status) 1 10 []  - 0 Reassessment of Adherence to Treatment Plan ASSESSMENTS - Wound and Skin Assessment / Reassessment X - Simple Wound Assessment / Reassessment - one wound 1 5 []  - 0 Complex Wound Assessment / Reassessment - multiple wounds []  - 0 Dermatologic / Skin Assessment (not related to wound area) ASSESSMENTS - Focused Assessment []  - Circumferential Edema Measurements - multi extremities 0 []  - 0 Nutritional Assessment / Counseling / Intervention []  - 0 Lower Extremity Assessment (monofilament, tuning fork, pulses) []  - 0 Peripheral Arterial Disease Assessment (using hand held doppler) ASSESSMENTS - Ostomy and/or Continence Assessment and Care []  - Incontinence Assessment and Management 0 []  - 0 Ostomy Care Assessment and Management (repouching, etc.) PROCESS - Coordination of Care X - Simple Patient / Family Education for ongoing care 1 15 []  - 0 Complex (extensive) Patient / Family Education for ongoing care X- 1 10 Staff obtains Programmer, systems, Records, Test Results / Process Orders []  - 0 Staff telephones HHA, Nursing Homes / Clarify orders / etc []  - 0 Routine Transfer to another Facility (non-emergent condition) []  - 0 Routine Hospital Admission (non-emergent condition) []  - 0 New Admissions / Biomedical engineer / Ordering NPWT, Apligraf, etc. []  - 0 Emergency Hospital Admission (emergent condition) []  - 0 Simple Discharge Coordination [name], [name] ([id number]) []  - 0 Complex (extensive) Discharge Coordination PROCESS - Special Needs []  - Pediatric / Minor Patient Management 0 []  - 0 Isolation Patient Management []  - 0 Hearing / Language / Visual special needs []  - 0 Assessment of Community assistance (transportation, D/C planning, etc.) []  - 0 Additional assistance / Altered mentation []  - 0 Support Surface(s) Assessment (bed, cushion, seat, etc.) INTERVENTIONS - Wound Cleansing / Measurement X -  Simple Wound Cleansing - one wound 1 5 []  - 0 Complex Wound Cleansing - multiple wounds X- 1 5 Wound Imaging (photographs - any number of wounds) []  - 0 Wound Tracing (instead of photographs)  X- 1 5 Simple Wound Measurement - one wound []  - 0 Complex Wound Measurement - multiple wounds INTERVENTIONS - Wound Dressings X - Small Wound Dressing one or multiple wounds 1 10 []  - 0 Medium Wound Dressing one or multiple wounds []  - 0 Large Wound Dressing one or multiple wounds []  - 0 Application of Medications - topical []  - 0 Application of Medications - injection INTERVENTIONS - Miscellaneous []  - External ear exam 0 []  - 0 Specimen Collection (cultures, biopsies, blood, body fluids, etc.) []  - 0 Specimen(s) / Culture(s) sent or taken to Lab for analysis []  - 0 Patient Transfer (multiple staff / Civil Service fast streamer / Similar devices) []  - 0 Simple Staple / Suture removal (25 or less) []  - 0 Complex Staple / Suture removal (26 or more) []  - 0 Hypo / Hyperglycemic Management (close monitor of Blood Glucose) []  - 0 Ankle / Brachial Index (ABI) - do not check if billed separately X- 1 5 Vital Signs [name], [name] ([id number]) Has the patient been seen at the hospital within the last three years: Yes Total Score: 70 Level Of Care: New/Established - Level 2 Electronic Signature(s) Signed: 03/19/2019 5:21:25 PM By: Gretta Cool, BSN, RN, CWS, Kim RN, BSN Entered By: Gretta Cool, BSN, RN, CWS, Kim on 03/19/2019 [name], [name] ([id number]) -------------------------------------------------------------------------------- Encounter Discharge Information Details Patient Name: [name] Date of Service: 03/19/2019 9:00 AM Medical Record Number: [id number] Patient Account Number: [id number] Date of Birth/Sex: [birth year] (85 y.o. M) Treating RN: Cornell Barman Primary Care Elianis Fischbach: Ria Bush Other Clinician: Referring Kamera Dubas: Ria Bush Treating Amaury Kuzel/Extender: Tito Dine in Treatment: 7 Encounter Discharge Information Items Discharge Condition: Stable Ambulatory Status: Ambulatory Discharge Destination: Home Transportation: Private Auto Accompanied By: self Schedule Follow-up Appointment: Yes Clinical Summary of Care: Electronic Signature(s) Signed: 03/19/2019 5:21:25 PM By: Gretta Cool, BSN, RN, CWS, Kim RN, BSN Entered By: Gretta Cool, BSN, RN, CWS, Kim on 03/19/2019 09:10:26 [name] ([id number]) -------------------------------------------------------------------------------- Lower Extremity Assessment Details Patient Name: [name] Date of Service: 03/19/2019 9:00 AM Medical Record Number: [id number] Patient Account Number: [id number] Date of Birth/Sex: [birth year] (85 y.o. M) Treating RN: Army Melia Primary Care Margee Trentham: Ria Bush Other Clinician: Referring Yaiden Yang: Ria Bush Treating Zhavia Cunanan/Extender: Ricard Dillon Weeks in Treatment: 7 Edema Assessment Assessed: [Left: No] [Right: No] Edema: [Left: N] [Right: o] Vascular Assessment Pulses: Dorsalis Pedis Palpable: [Left:Yes] Electronic Signature(s) Signed: 03/20/2019 3:26:34 PM By: Army Melia Entered By: Army Melia on 03/19/2019 08:59:29 [name], [name] ([id number]) -------------------------------------------------------------------------------- Multi Wound Chart Details Patient Name: [name] Date of Service: 03/19/2019 9:00 AM Medical Record Number: [id number] Patient Account Number: [id number] Date of Birth/Sex: [birth year] (85 y.o. M) Treating RN: Cornell Barman Primary Care Danajah Birdsell: Ria Bush Other Clinician: Referring Jhoana Upham: Ria Bush Treating Ianna Salmela/Extender: Tito Dine in Treatment: 7 Vital Signs Height(in): 69 Pulse(bpm): 55 Weight(lbs): 227 Blood Pressure(mmHg): 131/54 Body Mass Index(BMI): 34 Temperature(F): 98.2 Respiratory Rate 16 (breaths/min): Photos: [N/A:N/A] Wound Location:  Left Toe Great N/A N/A Wounding Event: Blister N/A N/A Primary Etiology: Neuropathic Ulcer-Non N/A N/A Diabetic Comorbid History: Cataracts, Chronic sinus N/A N/A problems/congestion, Hypertension, History of Burn, Gout Date Acquired: 12/07/2018 N/A N/A Weeks of Treatment: 7 N/A N/A Wound Status: Open N/A N/A Measurements L x W x D 0.1x0.1x0.1 N/A N/A (cm) Area (cm) : 0.008 N/A N/A Volume (cm) : 0.001 N/A N/A % Reduction in Area: 97.50% N/A N/A % Reduction in Volume: 99.20% N/A N/A Classification: Full Thickness Without N/A N/A Exposed Support Structures Exudate Amount: None Present N/A  N/A Wound Margin: Flat and Intact N/A N/A Granulation Amount: None Present (0%) N/A N/A Necrotic Amount: Large (67-100%) N/A N/A Necrotic Tissue: Eschar N/A N/A Exposed Structures: Fascia: No N/A N/A Fat Layer (Subcutaneous Tissue) Exposed: No Tendon: No Muscle: No [name], [name] ([id number]) Joint: No Bone: No Epithelialization: Large (67-100%) N/A N/A Treatment Notes Electronic Signature(s) Signed: 03/19/2019 5:41:49 PM By: Linton Ham MD Entered By: Linton Ham on 03/19/2019 [place] ([id number]) -------------------------------------------------------------------------------- Multi-Disciplinary Care Plan Details Patient Name: [name] Date of Service: 03/19/2019 9:00 AM Medical Record Number: [id number] Patient Account Number: [id number] Date of Birth/Sex: 1931/08/25 (85 y.o. M) Treating RN: Cornell Barman Primary Care Tomorrow Dehaas: Ria Bush Other Clinician: Referring Kaushal Vannice: Ria Bush Treating Miyo Aina/Extender: Tito Dine in Treatment: 7 Active Inactive Medication Nursing Diagnoses: Knowledge deficit related to medication safety: actual or potential Goals: Patient/caregiver will demonstrate understanding of all current medications Date Initiated: 01/29/2019 Target Resolution Date: 01/29/2019 Goal Status:  Active Interventions: Patient/Caregiver given reconciled medication list upon admission, changes in medications and discharge from the [facility] Notes: Necrotic Tissue Nursing Diagnoses: Impaired tissue integrity related to necrotic/devitalized tissue Goals: Necrotic/devitalized tissue will be minimized in the wound bed Date Initiated: 02/12/2019 Target Resolution Date: 02/19/2019 Goal Status: Active Interventions: Assess patient pain level pre-, during and post procedure and prior to discharge Treatment Activities: Apply topical anesthetic as ordered : 02/12/2019 Notes: Orientation to the Wound Care Program Nursing Diagnoses: Knowledge deficit related to the wound healing center program Goals: Patient/caregiver will verbalize understanding of the [place] Date Initiated: 01/29/2019 Target Resolution Date: 03/05/2019 [name], [name] ([id number]) Goal Status: Active Interventions: Provide education on orientation to the wound center Notes: Wound/Skin Impairment Nursing Diagnoses: Knowledge deficit related to ulceration/compromised skin integrity Goals: Patient/caregiver will verbalize understanding of skin care regimen Date Initiated: 01/29/2019 Target Resolution Date: 02/05/2019 Goal Status: Active Ulcer/skin breakdown will have a volume reduction of 30% by week 4 Date Initiated: 01/29/2019 Target Resolution Date: 02/19/2019 Goal Status: Active Interventions: Assess patient/caregiver ability to obtain necessary supplies Assess ulceration(s) every visit Treatment Activities: Skin care regimen initiated : 01/29/2019 Notes: Electronic Signature(s) Signed: 03/19/2019 5:21:25 PM By: Gretta Cool, BSN, RN, CWS, Kim RN, BSN Entered By: Gretta Cool, BSN, RN, CWS, Kim on 03/19/2019 09:05:41 [name] ([id number]) -------------------------------------------------------------------------------- Pain Assessment Details Patient Name: [name] Date of Service:  03/19/2019 9:00 AM Medical Record Number: [id number] Patient Account Number: [id number] Date of Birth/Sex: 1931/09/09 (85 y.o. M) Treating RN: Army Melia Primary Care Rashi Giuliani: Ria Bush Other Clinician: Referring Alexandrina Fiorini: Ria Bush Treating Tamarah Bhullar/Extender: Tito Dine in Treatment: 7 Active Problems Location of Pain Severity and Description of Pain Patient Has Paino No Site Locations Pain Management and Medication Current Pain Management: Electronic Signature(s) Signed: 03/20/2019 3:26:34 PM By: Army Melia Entered By: Army Melia on 03/19/2019 08:55:51 [name], [name] ([id number]) -------------------------------------------------------------------------------- Patient/Caregiver Education Details Patient Name: [name] Date of Service: 03/19/2019 9:00 AM Medical Record Number: [id number] Patient Account Number: [id number] Date of Birth/Gender: [birth year] (85 y.o. M) Treating RN: Cornell Barman Primary Care Physician: Ria Bush Other Clinician: Referring Physician: Ria Bush Treating Physician/Extender: Tito Dine in Treatment: 7 Education Assessment Education Provided To: Patient Education Topics Provided Wound/Skin Impairment: Handouts: Caring for Your Ulcer, Other: foam on wounds Methods: Demonstration, Explain/Verbal Responses: State content correctly Electronic Signature(s) Signed: 03/19/2019 5:21:25 PM By: Gretta Cool, BSN, RN, CWS, Kim RN, BSN Entered By: Gretta Cool, BSN, RN, CWS, Kim on 03/19/2019 09:09:41 [name] ([id number]) -------------------------------------------------------------------------------- Wound Assessment Details Patient Name: [name] Date  of Service: 03/19/2019 9:00 AM Medical Record Number: [id number] Patient Account Number: [id number] Date of Birth/Sex: [birth year] (85 y.o. M) Treating RN: Cornell Barman Primary Care Araseli Sherry: Ria Bush Other Clinician: Referring  Koah Chisenhall: Ria Bush Treating Sanyia Dini/Extender: Tito Dine in Treatment: 7 Wound Status Wound Number: 1 Primary Neuropathic Ulcer-Non Diabetic Etiology: Wound Location: Left Toe Great Wound Healed - Epithelialized Wounding Event: Blister Status: Date Acquired: 12/07/2018 Comorbid Cataracts, Chronic sinus problems/congestion, Weeks Of Treatment: 7 History: Hypertension, History of Burn, Gout Clustered Wound: No Photos Wound Measurements Length: (cm) 0 Width: (cm) 0 Depth: (cm) 0 Area: (cm) 0 Volume: (cm) 0 % Reduction in Area: 100% % Reduction in Volume: 100% Epithelialization: Large (67-100%) Undermining: No Wound Description Full Thickness Without Exposed Support Foul Od Classification: Structures Slough/ Wound Margin: Flat and Intact Exudate None Present Amount: or After Cleansing: No Fibrino No Wound Bed Granulation Amount: None Present (0%) Exposed Structure Necrotic Amount: Large (67-100%) Fascia Exposed: No Necrotic Quality: Eschar Fat Layer (Subcutaneous Tissue) Exposed: No Tendon Exposed: No Muscle Exposed: No Joint Exposed: No Bone Exposed: No Treatment Notes Wound #1 (Left [address] [name], [name] ([id number]) Notes foam, offloading shoe Electronic Signature(s) Signed: 03/19/2019 5:21:25 PM By: Gretta Cool, BSN, RN, CWS, Kim RN, BSN Entered By: Gretta Cool, BSN, RN, CWS, Kim on 03/19/2019 09:11:45 [name] ([id number]) -------------------------------------------------------------------------------- Vitals Details Patient Name: [name] Date of Service: 03/19/2019 9:00 AM Medical Record Number: [id number] Patient Account Number: [id number] Date of Birth/Sex: Apr 15, 1932 (85 y.o. M) Treating RN: Army Melia Primary Care Ozell Juhasz: Ria Bush Other Clinician: Referring Shelbee Apgar: Ria Bush Treating Mckennon Zwart/Extender: Tito Dine in Treatment: 7 Vital Signs Time Taken: 08:56 Temperature (F):  98.2 Height (in): 69 Pulse (bpm): 55 Weight (lbs): 227 Respiratory Rate (breaths/min): 16 Body Mass Index (BMI): 33.5 Blood Pressure (mmHg): 131/54 Reference Range: 80 - 120 mg / dl Electronic Signature(s) Signed: 03/20/2019 3:26:34 PM By: Army Melia Entered By: Army Melia on 03/19/2019 08:56:21

## 2019-03-26 ENCOUNTER — Encounter: Payer: Medicare Other | Admitting: Internal Medicine

## 2019-03-26 ENCOUNTER — Other Ambulatory Visit: Payer: Self-pay

## 2019-03-26 DIAGNOSIS — M109 Gout, unspecified: Secondary | ICD-10-CM | POA: Diagnosis not present

## 2019-03-26 DIAGNOSIS — Z8614 Personal history of Methicillin resistant Staphylococcus aureus infection: Secondary | ICD-10-CM | POA: Diagnosis not present

## 2019-03-26 DIAGNOSIS — L97521 Non-pressure chronic ulcer of other part of left foot limited to breakdown of skin: Secondary | ICD-10-CM | POA: Diagnosis not present

## 2019-03-26 DIAGNOSIS — I10 Essential (primary) hypertension: Secondary | ICD-10-CM | POA: Diagnosis not present

## 2019-03-26 DIAGNOSIS — S91102A Unspecified open wound of left great toe without damage to nail, initial encounter: Secondary | ICD-10-CM | POA: Diagnosis not present

## 2019-03-26 DIAGNOSIS — G9009 Other idiopathic peripheral autonomic neuropathy: Secondary | ICD-10-CM | POA: Diagnosis not present

## 2019-03-27 NOTE — Progress Notes (Signed)
JARTAVIOUS, DAVYDOV ([id number]) Visit Report for 03/26/2019 Arrival Information Details Patient Name: [name], [name] Date of Service: 03/26/2019 10:45 AM Medical Record Number: [id number] Patient Account Number: [id number] Date of Birth/Sex: May 07, 1932 (85 y.o. M) Treating RN: Army Melia Primary Care Netra Postlethwait: Ria Bush Other Clinician: Referring Shooter Tangen: Ria Bush Treating Mykelti Goldenstein/Extender: Tito Dine in Treatment: 8 Visit Information History Since Last Visit Added or deleted any medications: No Patient Arrived: Ambulatory Any new allergies or adverse reactions: No Arrival Time: 10:40 Had a fall or experienced change in No Accompanied By: self activities of daily living that may affect Transfer Assistance: None risk of falls: Patient Identification Verified: Yes Signs or symptoms of abuse/neglect since last visito No Patient Requires Transmission-Based No Hospitalized since last visit: No Precautions: Has Dressing in Place as Prescribed: Yes Patient Has Alerts: Yes Pain Present Now: No Patient Alerts: Patient on Blood Thinner Electronic Signature(s) Signed: 03/26/2019 4:09:40 PM By: Army Melia Entered By: Army Melia on 03/26/2019 10:40:19 [name] ([id number]) -------------------------------------------------------------------------------- Clinic Level of Care Assessment Details Patient Name: [name] Date of Service: 03/26/2019 10:45 AM Medical Record Number: [id number] Patient Account Number: [id number] Date of Birth/Sex: 03/03/1932 (85 y.o. M) Treating RN: Cornell Barman Primary Care Maryclaire Stoecker: Ria Bush Other Clinician: Referring Alden Feagan: Ria Bush Treating Pierce Barocio/Extender: Tito Dine in Treatment: 8 Clinic Level of Care Assessment Items TOOL 4 Quantity Score []  - Use when only an EandM is performed on FOLLOW-UP visit 0 ASSESSMENTS - Nursing Assessment / Reassessment []  - Reassessment of  Co-morbidities (includes updates in patient status) 0 X- 1 5 Reassessment of Adherence to Treatment Plan ASSESSMENTS - Wound and Skin Assessment / Reassessment X - Simple Wound Assessment / Reassessment - one wound 1 5 []  - 0 Complex Wound Assessment / Reassessment - multiple wounds []  - 0 Dermatologic / Skin Assessment (not related to wound area) ASSESSMENTS - Focused Assessment []  - Circumferential Edema Measurements - multi extremities 0 []  - 0 Nutritional Assessment / Counseling / Intervention []  - 0 Lower Extremity Assessment (monofilament, tuning fork, pulses) []  - 0 Peripheral Arterial Disease Assessment (using hand held doppler) ASSESSMENTS - Ostomy and/or Continence Assessment and Care []  - Incontinence Assessment and Management 0 []  - 0 Ostomy Care Assessment and Management (repouching, etc.) PROCESS - Coordination of Care X - Simple Patient / Family Education for ongoing care 1 15 []  - 0 Complex (extensive) Patient / Family Education for ongoing care []  - 0 Staff obtains Programmer, systems, Records, Test Results / Process Orders []  - 0 Staff telephones HHA, Nursing Homes / Clarify orders / etc []  - 0 Routine Transfer to another Facility (non-emergent condition) []  - 0 Routine Hospital Admission (non-emergent condition) []  - 0 New Admissions / Biomedical engineer / Ordering NPWT, Apligraf, etc. []  - 0 Emergency Hospital Admission (emergent condition) X- 1 10 Simple Discharge Coordination Cashwell, Edan ([id number]) []  - 0 Complex (extensive) Discharge Coordination PROCESS - Special Needs []  - Pediatric / Minor Patient Management 0 []  - 0 Isolation Patient Management []  - 0 Hearing / Language / Visual special needs []  - 0 Assessment of Community assistance (transportation, D/C planning, etc.) []  - 0 Additional assistance / Altered mentation []  - 0 Support Surface(s) Assessment (bed, cushion, seat, etc.) INTERVENTIONS - Wound Cleansing / Measurement []  -  Simple Wound Cleansing - one wound 0 []  - 0 Complex Wound Cleansing - multiple wounds []  - 0 Wound Imaging (photographs - any number of wounds) []  - 0 Wound Tracing (instead of photographs) []  -  0 Simple Wound Measurement - one wound []  - 0 Complex Wound Measurement - multiple wounds INTERVENTIONS - Wound Dressings []  - Small Wound Dressing one or multiple wounds 0 []  - 0 Medium Wound Dressing one or multiple wounds []  - 0 Large Wound Dressing one or multiple wounds []  - 0 Application of Medications - topical []  - 0 Application of Medications - injection INTERVENTIONS - Miscellaneous []  - External ear exam 0 []  - 0 Specimen Collection (cultures, biopsies, blood, body fluids, etc.) []  - 0 Specimen(s) / Culture(s) sent or taken to Lab for analysis []  - 0 Patient Transfer (multiple staff / Civil Service fast streamer / Similar devices) []  - 0 Simple Staple / Suture removal (25 or less) []  - 0 Complex Staple / Suture removal (26 or more) []  - 0 Hypo / Hyperglycemic Management (close monitor of Blood Glucose) []  - 0 Ankle / Brachial Index (ABI) - do not check if billed separately X- 1 5 Vital Signs Dieguez, Abelino ([id number]) Has the patient been seen at the hospital within the last three years: Yes Total Score: 40 Level Of Care: New/Established - Level 2 Electronic Signature(s) Signed: 03/27/2019 1:15:06 PM By: Gretta Cool, BSN, RN, CWS, Kim RN, BSN Entered By: Gretta Cool, BSN, RN, CWS, Kim on 03/26/2019 10:53:23 [name] ([id number]) -------------------------------------------------------------------------------- Encounter Discharge Information Details Patient Name: [name] Date of Service: 03/26/2019 10:45 AM Medical Record Number: [id number] Patient Account Number: [id number] Date of Birth/Sex: 1932/03/09 (85 y.o. M) Treating RN: Cornell Barman Primary Care Kenichi Cassada: Ria Bush Other Clinician: Referring Evangelos Paulino: Ria Bush Treating Wilene Pharo/Extender: Tito Dine in Treatment: 8 Encounter Discharge Information Items Discharge Condition: Stable Ambulatory Status: Ambulatory Discharge Destination: Home Transportation: Private Auto Accompanied By: self Schedule Follow-up Appointment: Yes Clinical Summary of Care: Electronic Signature(s) Signed: 03/27/2019 1:15:06 PM By: Gretta Cool, BSN, RN, CWS, Kim RN, BSN Entered By: Gretta Cool, BSN, RN, CWS, Kim on 03/26/2019 10:53:52 [name] ([id number]) -------------------------------------------------------------------------------- Lower Extremity Assessment Details Patient Name: [name] Date of Service: 03/26/2019 10:45 AM Medical Record Number: [id number] Patient Account Number: [id number] Date of Birth/Sex: 1931-06-02 (85 y.o. M) Treating RN: Army Melia Primary Care Gerene Nedd: Ria Bush Other Clinician: Referring Izea Livolsi: Ria Bush Treating Kyce Ging/Extender: Ricard Dillon Weeks in Treatment: 8 Edema Assessment Assessed: [Left: No] [Right: No] Edema: [Left: N] [Right: o] Vascular Assessment Pulses: Dorsalis Pedis Palpable: [Left:Yes] Electronic Signature(s) Signed: 03/26/2019 4:09:40 PM By: Army Melia Entered By: Army Melia on 03/26/2019 10:42:11 Powless, Yobany ([id number]) -------------------------------------------------------------------------------- Multi Wound Chart Details Patient Name: [name] Date of Service: 03/26/2019 10:45 AM Medical Record Number: [id number] Patient Account Number: [id number] Date of Birth/Sex: 1932-05-05 (85 y.o. M) Treating RN: Cornell Barman Primary Care Maelee Hoot: Ria Bush Other Clinician: Referring Gadge Hermiz: Ria Bush Treating Navada Osterhout/Extender: Tito Dine in Treatment: 8 Vital Signs Height(in): 69 Pulse(bpm): 58 Weight(lbs): 227 Blood Pressure(mmHg): 144/52 Body Mass Index(BMI): 34 Temperature(F): 97.8 Respiratory Rate 16 (breaths/min): Wound Assessments Treatment  Notes Electronic Signature(s) Signed: 03/27/2019 1:15:06 PM By: Gretta Cool, BSN, RN, CWS, Kim RN, BSN Entered By: Gretta Cool, BSN, RN, CWS, Kim on 03/26/2019 10:52:25 ARVIL, KMIECIK ([id number]) -------------------------------------------------------------------------------- Multi-Disciplinary Care Plan Details Patient Name: [name] Date of Service: 03/26/2019 10:45 AM Medical Record Number: [id number] Patient Account Number: [id number] Date of Birth/Sex: Apr 24, 1932 (85 y.o. M) Treating RN: Cornell Barman Primary Care Dimitri Dsouza: Ria Bush Other Clinician: Referring Tacuma Graffam: Ria Bush Treating Ronney Honeywell/Extender: Tito Dine in Treatment: 8 Active Inactive Electronic Signature(s) Signed: 03/27/2019 1:15:06 PM By: Gretta Cool, BSN, RN, CWS, Kim RN, BSN Entered By:  Gretta Cool, BSN, RN, CWS, Kim on 03/26/2019 10:52:16 TARRENCE, ROMICK ([id number]) -------------------------------------------------------------------------------- Pain Assessment Details Patient Name: ROCKO, YARMAN Date of Service: 03/26/2019 10:45 AM Medical Record Number: [id number] Patient Account Number: [id number] Date of Birth/Sex: 06/30/1931 (85 y.o. M) Treating RN: Army Melia Primary Care Amberley Hamler: Ria Bush Other Clinician: Referring Thy Gullikson: Ria Bush Treating Rohaan Durnil/Extender: Tito Dine in Treatment: 8 Active Problems Location of Pain Severity and Description of Pain Patient Has Paino No Site Locations Pain Management and Medication Current Pain Management: Electronic Signature(s) Signed: 03/26/2019 4:09:40 PM By: Army Melia Entered By: Army Melia on 03/26/2019 10:40:25 [name] ([id number]) -------------------------------------------------------------------------------- Patient/Caregiver Education Details Patient Name: [name] Date of Service: 03/26/2019 10:45 AM Medical Record Number: [id number] Patient Account Number: [id number] Date of  Birth/Gender: 1932-04-28 (85 y.o. M) Treating RN: Cornell Barman Primary Care Physician: Ria Bush Other Clinician: Referring Physician: Ria Bush Treating Physician/Extender: Tito Dine in Treatment: 8 Education Assessment Education Provided To: Patient Education Topics Provided Engineer, maintenance) Signed: 03/27/2019 1:15:06 PM By: Gretta Cool, BSN, RN, CWS, Kim RN, BSN Entered By: Gretta Cool, BSN, RN, CWS, Kim on 03/26/2019 10:53:36 [name] ([id number]) -------------------------------------------------------------------------------- Vitals Details Patient Name: [name] Date of Service: 03/26/2019 10:45 AM Medical Record Number: [id number] Patient Account Number: [id number] Date of Birth/Sex: [birth year] (85 y.o. M) Treating RN: Army Melia Primary Care Reily Treloar: Ria Bush Other Clinician: Referring Hermenegildo Clausen: Ria Bush Treating Catrina Fellenz/Extender: Tito Dine in Treatment: 8 Vital Signs Time Taken: 10:40 Temperature (F): 97.8 Height (in): 69 Pulse (bpm): 58 Weight (lbs): 227 Respiratory Rate (breaths/min): 16 Body Mass Index (BMI): 33.5 Blood Pressure (mmHg): 144/52 Reference Range: 80 - 120 mg / dl Electronic Signature(s) Signed: 03/26/2019 4:09:40 PM By: Army Melia Entered By: Army Melia on 03/26/2019 10:41:55
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
## 2019-04-02 ENCOUNTER — Ambulatory Visit: Payer: Medicare Other | Admitting: Physician Assistant

## 2019-04-09 DIAGNOSIS — M5416 Radiculopathy, lumbar region: Secondary | ICD-10-CM | POA: Diagnosis not present

## 2019-04-09 DIAGNOSIS — M9905 Segmental and somatic dysfunction of pelvic region: Secondary | ICD-10-CM | POA: Diagnosis not present

## 2019-04-09 DIAGNOSIS — M9903 Segmental and somatic dysfunction of lumbar region: Secondary | ICD-10-CM | POA: Diagnosis not present

## 2019-04-09 DIAGNOSIS — M955 Acquired deformity of pelvis: Secondary | ICD-10-CM | POA: Diagnosis not present

## 2019-04-16 ENCOUNTER — Other Ambulatory Visit
Admission: RE | Admit: 2019-04-16 | Discharge: 2019-04-16 | Disposition: A | Discharge status: Home / Self Care | Payer: Medicare Other | Source: Ambulatory Visit | Attending: Internal Medicine | Admitting: Internal Medicine

## 2019-04-16 ENCOUNTER — Other Ambulatory Visit: Payer: Self-pay | Admitting: Internal Medicine

## 2019-04-16 ENCOUNTER — Ambulatory Visit
Admission: RE | Admit: 2019-04-16 | Discharge: 2019-04-16 | Disposition: A | Discharge status: Home / Self Care | Payer: Medicare Other | Source: Ambulatory Visit | Attending: Internal Medicine | Admitting: Internal Medicine

## 2019-04-16 ENCOUNTER — Encounter: Payer: Medicare Other | Attending: Internal Medicine | Admitting: Internal Medicine

## 2019-04-16 ENCOUNTER — Other Ambulatory Visit: Payer: Self-pay

## 2019-04-16 DIAGNOSIS — I10 Essential (primary) hypertension: Secondary | ICD-10-CM | POA: Insufficient documentation

## 2019-04-16 DIAGNOSIS — B999 Unspecified infectious disease: Secondary | ICD-10-CM | POA: Insufficient documentation

## 2019-04-16 DIAGNOSIS — G9009 Other idiopathic peripheral autonomic neuropathy: Secondary | ICD-10-CM | POA: Diagnosis not present

## 2019-04-16 DIAGNOSIS — L97522 Non-pressure chronic ulcer of other part of left foot with fat layer exposed: Secondary | ICD-10-CM | POA: Insufficient documentation

## 2019-04-16 DIAGNOSIS — Z8614 Personal history of Methicillin resistant Staphylococcus aureus infection: Secondary | ICD-10-CM | POA: Insufficient documentation
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
## 2019-04-17 NOTE — Progress Notes (Signed)
JAYTHON, STOFFLET ([id number]) Visit Report for 04/16/2019 Arrival Information Details Patient Name: [name], [name] Date of Service: 04/16/2019 8:45 AM Medical Record Number: [id number] Patient Account Number: [id number] Date of Birth/Sex: 1932/02/23 (85 y.o. M) Treating RN: Cornell Barman Primary Care Camani Sesay: Ria Bush Other Clinician: Referring Sharad Vaneaton: Ria Bush Treating Veatrice Eckstein/Extender: Tito Dine in Treatment: 11 Visit Information History Since Last Visit Added or deleted any medications: No Patient Arrived: Ambulatory Any new allergies or adverse reactions: No Arrival Time: 08:59 Had a fall or experienced change in No Accompanied By: self activities of daily living that may affect Transfer Assistance: None risk of falls: Patient Requires Transmission-Based No Signs or symptoms of abuse/neglect since last visito No Precautions: Hospitalized since last visit: No Patient Has Alerts: Yes Has Dressing in Place as Prescribed: Yes Patient Alerts: Patient on Blood Pain Present Now: No Thinner Electronic Signature(s) Signed: 04/16/2019 5:38:26 PM By: Gretta Cool, BSN, RN, CWS, Kim RN, BSN Entered By: Gretta Cool, BSN, RN, CWS, Kim on 04/16/2019 08:59:18 [name] ([id number]) -------------------------------------------------------------------------------- Encounter Discharge Information Details Patient Name: [name] Date of Service: 04/16/2019 8:45 AM Medical Record Number: [id number] Patient Account Number: [id number] Date of Birth/Sex: 02-Sep-1931 (85 y.o. M) Treating RN: Montey Hora Primary Care Aleeta Robles: Ria Bush Other Clinician: Referring Lewanda Perea: Ria Bush Treating Aynslee Mulhall/Extender: Tito Dine in Treatment: 11 Encounter Discharge Information Items Post Procedure Vitals Discharge Condition: Stable Temperature (F): 97.6 Ambulatory Status: Ambulatory Pulse (bpm): 60 Discharge Destination: Home Respiratory Rate  (breaths/min): 16 Transportation: Private Auto Blood Pressure (mmHg): 135/72 Accompanied By: self Schedule Follow-up Appointment: Yes Clinical Summary of Care: Electronic Signature(s) Signed: 04/16/2019 5:12:19 PM By: Montey Hora Entered By: Montey Hora on 04/16/2019 09:34:06 Eltzroth, Shelly Flatten ([id number]) -------------------------------------------------------------------------------- Lower Extremity Assessment Details Patient Name: [name] Date of Service: 04/16/2019 8:45 AM Medical Record Number: [id number] Patient Account Number: [id number] Date of Birth/Sex: 02/11/1932 (85 y.o. M) Treating RN: Cornell Barman Primary Care Jacquie Lukes: Ria Bush Other Clinician: Referring Gabrelle Roca: Ria Bush Treating Lavella Myren/Extender: Tito Dine in Treatment: 11 Edema Assessment Assessed: [Left: No] [Right: No] Edema: [Left: N] [Right: o] Vascular Assessment Pulses: Dorsalis Pedis Palpable: [Left:Yes] Electronic Signature(s) Signed: 04/16/2019 5:38:26 PM By: Gretta Cool, BSN, RN, CWS, Kim RN, BSN Entered By: Gretta Cool, BSN, RN, CWS, Kim on 04/16/2019 09:02:45 [name] ([id number]) -------------------------------------------------------------------------------- Multi Wound Chart Details Patient Name: [name] Date of Service: 04/16/2019 8:45 AM Medical Record Number: [id number] Patient Account Number: [id number] Date of Birth/Sex: 03-21-1932 (85 y.o. M) Treating RN: Cornell Barman Primary Care Sherica Paternostro: Ria Bush Other Clinician: Referring Dartanyan Deasis: Ria Bush Treating Donshay Lupinski/Extender: Tito Dine in Treatment: 11 Vital Signs Height(in): 69 Pulse(bpm): 60 Weight(lbs): 227 Blood Pressure(mmHg): 135/72 Body Mass Index(BMI): 34 Temperature(F): 97.6 Respiratory Rate 16 (breaths/min): Photos: [N/A:N/A] Wound Location: Left Toe Great N/A N/A Wounding Event: Blister N/A N/A Primary Etiology: Neuropathic Ulcer-Non N/A  N/A Diabetic Comorbid History: Cataracts, Chronic sinus N/A N/A problems/congestion, Hypertension, History of Burn, Gout Date Acquired: 12/07/2018 N/A N/A Weeks of Treatment: 11 N/A N/A Wound Status: Open N/A N/A Wound Recurrence: Yes N/A N/A Measurements L x W x D 0.5x0.5x0.3 N/A N/A (cm) Area (cm) : 0.196 N/A N/A Volume (cm) : 0.059 N/A N/A % Reduction in Area: 37.60% N/A N/A % Reduction in Volume: 53.20% N/A N/A Classification: Full Thickness Without N/A N/A Exposed Support Structures Exudate Amount: Medium N/A N/A ZAYLIN, DYESS ([id number]) Exudate Type: Serosanguineous N/A N/A Exudate Color: red, brown N/A N/A Wound Margin: Flat and Intact N/A N/A Granulation Amount: Small (1-33%) N/A  N/A Granulation Quality: Red N/A N/A Necrotic Amount: Large (67-100%) N/A N/A Necrotic Tissue: Eschar N/A N/A Exposed Structures: Fat Layer (Subcutaneous N/A N/A Tissue) Exposed: Yes Fascia: No Tendon: No Muscle: No Joint: No Bone: No Epithelialization: None N/A N/A Debridement: Debridement - Selective/Open N/A N/A Wound Pre-procedure 09:08 N/A N/A Verification/Time Out Taken: Pain Control: Lidocaine N/A N/A Tissue Debrided: Callus N/A N/A Level: Skin/Dermis N/A N/A Debridement Area (sq cm): 0.45 N/A N/A Instrument: Curette N/A N/A Specimen: Swab N/A N/A Number of Specimens 1 N/A N/A Taken: Bleeding: Large N/A N/A Hemostasis Achieved: Silver Nitrate N/A N/A Debridement Treatment Procedure was tolerated well N/A N/A Response: Post Debridement 0.9x0.5x0.4 N/A N/A Measurements L x W x D (cm) Post Debridement Volume: 0.141 N/A N/A (cm) Procedures Performed: Debridement N/A N/A Treatment Notes Wound #1R (Left Toe Great) Notes prisma, foam, conform and offloading shoe Electronic Signature(s) Signed: 04/16/2019 6:09:55 PM By: Linton Ham MD Entered By: Linton Ham on 04/16/2019 10:32:48 [name]  ([id number]) -------------------------------------------------------------------------------- Multi-Disciplinary Care Plan Details Patient Name: [name] Date of Service: 04/16/2019 8:45 AM Medical Record Number: [id number] Patient Account Number: [id number] Date of Birth/Sex: 1931/08/27 (85 y.o. M) Treating RN: Cornell Barman Primary Care Charlottie Peragine: Ria Bush Other Clinician: Referring Zairah Arista: Ria Bush Treating Nikkia Devoss/Extender: Tito Dine in Treatment: 11 Active Inactive Medication Nursing Diagnoses: Knowledge deficit related to medication safety: actual or potential Goals: Patient/caregiver will demonstrate understanding of all current medications Date Initiated: 01/29/2019 Target Resolution Date: 01/29/2019 Goal Status: Active Interventions: Patient/Caregiver given reconciled medication list upon admission, changes in medications and discharge from the [facility] Notes: Necrotic Tissue Nursing Diagnoses: Impaired tissue integrity related to necrotic/devitalized tissue Goals: Necrotic/devitalized tissue will be minimized in the wound bed Date Initiated: 02/12/2019 Target Resolution Date: 02/19/2019 Goal Status: Active Interventions: Assess patient pain level pre-, during and post procedure and prior to discharge Treatment Activities: Apply topical anesthetic as ordered : 02/12/2019 Notes: Orientation to the Wound Care Program Nursing Diagnoses: Knowledge deficit related to the wound healing center program Goals: Patient/caregiver will verbalize understanding of the [facility] Program Date Initiated: 01/29/2019 Target Resolution Date: 03/05/2019 KOKI, PILLADO ([id number]) Goal Status: Active Interventions: Provide education on orientation to the wound center Notes: Wound/Skin Impairment Nursing Diagnoses: Knowledge deficit related to ulceration/compromised skin integrity Goals: Patient/caregiver will verbalize  understanding of skin care regimen Date Initiated: 01/29/2019 Target Resolution Date: 02/05/2019 Goal Status: Active Ulcer/skin breakdown will have a volume reduction of 30% by week 4 Date Initiated: 01/29/2019 Target Resolution Date: 02/19/2019 Goal Status: Active Interventions: Assess patient/caregiver ability to obtain necessary supplies Assess ulceration(s) every visit Treatment Activities: Skin care regimen initiated : 01/29/2019 Notes: Electronic Signature(s) Signed: 04/16/2019 5:38:26 PM By: Gretta Cool, BSN, RN, CWS, Kim RN, BSN Entered By: Gretta Cool, BSN, RN, CWS, Kim on 04/16/2019 09:10:16 [name] ([id number]) -------------------------------------------------------------------------------- Pain Assessment Details Patient Name: [name] Date of Service: 04/16/2019 8:45 AM Medical Record Number: [id number] Patient Account Number: [id number] Date of Birth/Sex: [birth year] (85 y.o. M) Treating RN: Cornell Barman Primary Care Sequita Wise: Ria Bush Other Clinician: Referring Claretta Kendra: Ria Bush Treating Amarii Bordas/Extender: Tito Dine in Treatment: 11 Active Problems Location of Pain Severity and Description of Pain Patient Has Paino No Site Locations Pain Management and Medication Current Pain Management: Electronic Signature(s) Signed: 04/16/2019 5:38:26 PM By: Gretta Cool, BSN, RN, CWS, Kim RN, BSN Entered By: Gretta Cool, BSN, RN, CWS, Kim on 04/16/2019 08:59:51 [name] ([id number]) -------------------------------------------------------------------------------- Patient/Caregiver Education Details Patient Name: [name] Date of Service: 04/16/2019 8:45 AM Medical Record  Number: [id number] Patient Account Number: [id number] Date of Birth/Gender: 1932/04/30 (85 y.o. M) Treating RN: Cornell Barman Primary Care Physician: Ria Bush Other Clinician: Referring Physician: Ria Bush Treating Physician/Extender: Tito Dine in  Treatment: 11 Education Assessment Education Provided To: Patient Education Topics Provided Offloading: Handouts: What is Offloadingo Methods: Demonstration, Explain/Verbal Wound/Skin Impairment: Handouts: Caring for Your Ulcer Methods: Demonstration, Explain/Verbal Responses: State content correctly Electronic Signature(s) Signed: 04/16/2019 5:38:26 PM By: Gretta Cool, BSN, RN, CWS, Kim RN, BSN Entered By: Gretta Cool, BSN, RN, CWS, Kim on 04/16/2019 09:22:26 [name] ([id number]) -------------------------------------------------------------------------------- Wound Assessment Details Patient Name: [name] Date of Service: 04/16/2019 8:45 AM Medical Record Number: [id number] Patient Account Number: [id number] Date of Birth/Sex: 14-Dec-1931 (85 y.o. M) Treating RN: Cornell Barman Primary Care Rashaun Wichert: Ria Bush Other Clinician: Referring Jeremiah Curci: Ria Bush Treating Jaston Havens/Extender: Tito Dine in Treatment: 11 Wound Status Wound Number: 1R Primary Neuropathic Ulcer-Non Diabetic Etiology: Wound Location: Left Toe Great Wound Open Wounding Event: Blister Status: Date Acquired: 12/07/2018 Comorbid Cataracts, Chronic sinus problems/congestion, Weeks Of Treatment: 11 History: Hypertension, History of Burn, Gout Clustered Wound: No Photos Wound Measurements Length: (cm) 0.5 Width: (cm) 0.5 Depth: (cm) 0.3 Area: (cm) 0.196 Volume: (cm) 0.059 % Reduction in Area: 37.6% % Reduction in Volume: 53.2% Epithelialization: None Tunneling: No Undermining: No Wound Description Full Thickness Without Exposed Support Classification: Structures Wound Margin: Flat and Intact Exudate Medium Amount: Exudate Type: Serosanguineous Exudate Color: red, brown Foul Odor After Cleansing: No Slough/Fibrino Yes Wound Bed Granulation Amount: Small (1-33%) Exposed Structure Granulation Quality: Red Fascia Exposed: No Necrotic Amount: Large (67-100%) Fat  Layer (Subcutaneous Tissue) Exposed: Yes Necrotic Quality: Eschar Tendon Exposed: No Muscle Exposed: No Joint Exposed: No Bone Exposed: No Vickrey, Hipolito ([id number]) Treatment Notes Wound #1R (Left Toe Great) Notes prisma, foam, conform and offloading shoe Electronic Signature(s) Signed: 04/16/2019 5:38:26 PM By: Gretta Cool, BSN, RN, CWS, Kim RN, BSN Entered By: Gretta Cool, BSN, RN, CWS, Kim on 04/16/2019 09:23:33 [name] ([id number]) -------------------------------------------------------------------------------- Vitals Details Patient Name: [name] Date of Service: 04/16/2019 8:45 AM Medical Record Number: [id number] Patient Account Number: [id number] Date of Birth/Sex: [birth year] (85 y.o. M) Treating RN: Cornell Barman Primary Care Jaleigh Mccroskey: Ria Bush Other Clinician: Referring Kimm Ungaro: Ria Bush Treating Christiano Blandon/Extender: Tito Dine in Treatment: 11 Vital Signs Time Taken: 08:59 Temperature (F): 97.6 Height (in): 69 Pulse (bpm): 60 Weight (lbs): 227 Respiratory Rate (breaths/min): 16 Body Mass Index (BMI): 33.5 Blood Pressure (mmHg): 135/72 Reference Range: 80 - 120 mg / dl Electronic Signature(s) Signed: 04/16/2019 5:38:26 PM By: Gretta Cool, BSN, RN, CWS, Kim RN, BSN Entered By: Gretta Cool, BSN, RN, CWS, Kim on 04/16/2019 09:00:14

## 2019-04-20 LAB — AEROBIC CULTURE W GRAM STAIN (SUPERFICIAL SPECIMEN): Gram Stain: NONE SEEN

## 2019-04-23 ENCOUNTER — Other Ambulatory Visit: Payer: Self-pay

## 2019-04-23 ENCOUNTER — Encounter: Payer: Medicare Other | Admitting: Internal Medicine

## 2019-04-23 DIAGNOSIS — L97522 Non-pressure chronic ulcer of other part of left foot with fat layer exposed: Secondary | ICD-10-CM | POA: Diagnosis not present

## 2019-04-24 ENCOUNTER — Other Ambulatory Visit: Payer: Self-pay | Admitting: Internal Medicine

## 2019-04-24 DIAGNOSIS — L97521 Non-pressure chronic ulcer of other part of left foot limited to breakdown of skin: Secondary | ICD-10-CM

## 2019-04-24 NOTE — Progress Notes (Signed)
PONCIANO, LOMBOY ([id number]) Visit Report for 04/23/2019 Arrival Information Details Patient Name: [name], [name] Date of Service: 04/23/2019 3:45 PM Medical Record Number: [id number] Patient Account Number: [id number] Date of Birth/Sex: 02/20/1932 (85 y.o. M) Treating RN: Montey Hora Primary Care Para Cossey: Ria Bush Other Clinician: Referring Alexie Samson: Ria Bush Treating Damont Balles/Extender: Tito Dine in Treatment: 12 Visit Information History Since Last Visit Added or deleted any medications: No Patient Arrived: Ambulatory Any new allergies or adverse reactions: No Arrival Time: 15:41 Had a fall or experienced change in No Accompanied By: self activities of daily living that may affect Transfer Assistance: None risk of falls: Patient Identification Verified: Yes Signs or symptoms of abuse/neglect since last visito No Secondary Verification Process Yes Hospitalized since last visit: No Completed: Has Dressing in Place as Prescribed: Yes Patient Requires Transmission-Based No Pain Present Now: No Precautions: Patient Has Alerts: Yes Patient Alerts: Patient on Blood Thinner Electronic Signature(s) Signed: 04/23/2019 4:12:46 PM By: Montey Hora Entered By: Montey Hora on 04/23/2019 15:43:38 Siglin, Ramone ([id number]) -------------------------------------------------------------------------------- Encounter Discharge Information Details Patient Name: [name] Date of Service: 04/23/2019 3:45 PM Medical Record Number: [id number] Patient Account Number: [id number] Date of Birth/Sex: 10-31-1931 (85 y.o. M) Treating RN: Cornell Barman Primary Care Yeraldin Litzenberger: Ria Bush Other Clinician: Referring Raela Bohl: Ria Bush Treating Kamy Poinsett/Extender: Tito Dine in Treatment: 12 Encounter Discharge Information Items Post Procedure Vitals Discharge Condition: Stable Temperature (F): 98.1 Ambulatory Status:  Ambulatory Pulse (bpm): 52 Discharge Destination: Home Respiratory Rate (breaths/min): 16 Transportation: Private Auto Blood Pressure (mmHg): 132/51 Accompanied By: self Schedule Follow-up Appointment: Yes Clinical Summary of Care: Electronic Signature(s) Signed: 04/24/2019 11:42:17 AM By: Gretta Cool, BSN, RN, CWS, Kim RN, BSN Entered By: Gretta Cool, BSN, RN, CWS, Kim on 04/23/2019 16:04:04 [name] ([id number]) -------------------------------------------------------------------------------- Lower Extremity Assessment Details Patient Name: [name] Date of Service: 04/23/2019 3:45 PM Medical Record Number: [id number] Patient Account Number: [id number] Date of Birth/Sex: 1932-01-17 (85 y.o. M) Treating RN: Montey Hora Primary Care Emely Fahy: Ria Bush Other Clinician: Referring Mae Cianci: Ria Bush Treating Suhani Stillion/Extender: Ricard Dillon Weeks in Treatment: 12 Edema Assessment Assessed: [Left: No] [Right: No] Edema: [Left: N] [Right: o] Vascular Assessment Pulses: Dorsalis Pedis Palpable: [Left:Yes] Electronic Signature(s) Signed: 04/23/2019 4:12:46 PM By: Montey Hora Entered By: Montey Hora on 04/23/2019 15:47:35 Mitchum, Levon ([id number]) -------------------------------------------------------------------------------- Multi Wound Chart Details Patient Name: [name] Date of Service: 04/23/2019 3:45 PM Medical Record Number: [id number] Patient Account Number: [id number] Date of Birth/Sex: 08-17-1931 (85 y.o. M) Treating RN: Cornell Barman Primary Care Daine Croker: Ria Bush Other Clinician: Referring Franklin Clapsaddle: Ria Bush Treating Veleda Mun/Extender: Tito Dine in Treatment: 12 Vital Signs Height(in): 69 Pulse(bpm): 52 Weight(lbs): 227 Blood Pressure(mmHg): 132/51 Body Mass Index(BMI): 34 Temperature(F): 98.1 Respiratory Rate 16 (breaths/min): Photos: [N/A:N/A] Wound Location: Left Toe Great N/A  N/A Wounding Event: Blister N/A N/A Primary Etiology: Neuropathic Ulcer-Non N/A N/A Diabetic Comorbid History: Cataracts, Chronic sinus N/A N/A problems/congestion, Hypertension, History of Burn, Gout Date Acquired: 12/07/2018 N/A N/A Weeks of Treatment: 12 N/A N/A Wound Status: Open N/A N/A Wound Recurrence: Yes N/A N/A Measurements L x W x D 0.5x0.4x0.3 N/A N/A (cm) Area (cm) : 0.157 N/A N/A Volume (cm) : 0.047 N/A N/A % Reduction in Area: 50.00% N/A N/A % Reduction in Volume: 62.70% N/A N/A Classification: Full Thickness Without N/A N/A Exposed Support Structures Exudate Amount: Medium N/A N/A Exudate Type: Serosanguineous N/A N/A Exudate Color: red, brown N/A N/A Wound Margin: Flat and Intact N/A N/A Granulation Amount: None Present (0%) N/A N/A  Necrotic Amount: Large (67-100%) N/A N/A Exposed Structures: Fat Layer (Subcutaneous N/A N/A Tissue) Exposed: Yes Fascia: No Trimmer, Raheim ([id number]) Tendon: No Muscle: No Joint: No Bone: No Epithelialization: None N/A N/A Debridement: Debridement - Excisional N/A N/A Pre-procedure 15:58 N/A N/A Verification/Time Out Taken: Pain Control: Lidocaine N/A N/A Tissue Debrided: Callus, Subcutaneous N/A N/A Level: Skin/Subcutaneous Tissue N/A N/A Debridement Area (sq cm): 0.25 N/A N/A Instrument: Curette N/A N/A Bleeding: Moderate N/A N/A Hemostasis Achieved: Silver Nitrate N/A N/A Debridement Treatment Procedure was tolerated well N/A N/A Response: Post Debridement 1x1x0.4 N/A N/A Measurements L x W x D (cm) Post Debridement Volume: 0.314 N/A N/A (cm) Procedures Performed: Debridement N/A N/A Treatment Notes Wound #1R (Left Toe Great) Notes prisma, foam, conform and offloading shoe Electronic Signature(s) Signed: 04/23/2019 4:44:44 PM By: Linton Ham MD Entered By: Linton Ham on 04/23/2019 16:22:08 [name]  ([id number]) -------------------------------------------------------------------------------- [place] Details Patient Name: [name] Date of Service: 04/23/2019 3:45 PM Medical Record Number: [id number] Patient Account Number: [id number] Date of Birth/Sex: June 03, 1931 (85 y.o. M) Treating RN: Cornell Barman Primary Care Ibn Stief: Ria Bush Other Clinician: Referring Bonnita Newby: Ria Bush Treating Sura Canul/Extender: Tito Dine in Treatment: 12 Active Inactive Medication Nursing Diagnoses: Knowledge deficit related to medication safety: actual or potential Goals: Patient/caregiver will demonstrate understanding of all current medications Date Initiated: 01/29/2019 Target Resolution Date: 01/29/2019 Goal Status: Active Interventions: Patient/Caregiver given reconciled medication list upon admission, changes in medications and discharge from the [facility] Notes: Necrotic Tissue Nursing Diagnoses: Impaired tissue integrity related to necrotic/devitalized tissue Goals: Necrotic/devitalized tissue will be minimized in the wound bed Date Initiated: 02/12/2019 Target Resolution Date: 02/19/2019 Goal Status: Active Interventions: Assess patient pain level pre-, during and post procedure and prior to discharge Treatment Activities: Apply topical anesthetic as ordered : 02/12/2019 Notes: Orientation to the Wound Care Program Nursing Diagnoses: Knowledge deficit related to the wound healing center program Goals: Patient/caregiver will verbalize understanding of the [facility] Date Initiated: 01/29/2019 Target Resolution Date: 03/05/2019 JAYQUAN, GOTSHALL ([id number]) Goal Status: Active Interventions: Provide education on orientation to the wound center Notes: Wound/Skin Impairment Nursing Diagnoses: Knowledge deficit related to ulceration/compromised skin integrity Goals: Patient/caregiver will verbalize  understanding of skin care regimen Date Initiated: 01/29/2019 Target Resolution Date: 02/05/2019 Goal Status: Active Ulcer/skin breakdown will have a volume reduction of 30% by week 4 Date Initiated: 01/29/2019 Target Resolution Date: 02/19/2019 Goal Status: Active Interventions: Assess patient/caregiver ability to obtain necessary supplies Assess ulceration(s) every visit Treatment Activities: Skin care regimen initiated : 01/29/2019 Notes: Electronic Signature(s) Signed: 04/24/2019 11:42:17 AM By: Gretta Cool, BSN, RN, CWS, Kim RN, BSN Entered By: Gretta Cool, BSN, RN, CWS, Kim on 04/23/2019 15:55:02 [name] ([id number]) -------------------------------------------------------------------------------- Pain Assessment Details Patient Name: [name] Date of Service: 04/23/2019 3:45 PM Medical Record Number: [id number] Patient Account Number: [id number] Date of Birth/Sex: [birth year] (85 y.o. M) Treating RN: Montey Hora Primary Care Alyne Martinson: Ria Bush Other Clinician: Referring Alyanna Stoermer: Ria Bush Treating Johnnie Goynes/Extender: Tito Dine in Treatment: 12 Active Problems Location of Pain Severity and Description of Pain Patient Has Paino No Site Locations Pain Management and Medication Current Pain Management: Electronic Signature(s) Signed: 04/23/2019 4:12:46 PM By: Montey Hora Entered By: Montey Hora on 04/23/2019 15:43:47 Alper, Raffaele ([id number]) -------------------------------------------------------------------------------- Patient/Caregiver Education Details Patient Name: [name] Date of Service: 04/23/2019 3:45 PM Medical Record Number: [id number] Patient Account Number: [id number] Date of Birth/Gender: Oct 02, 1931 (85 y.o. M) Treating RN: Cornell Barman Primary Care Physician: Ria Bush Other Clinician: Referring Physician:  Ria Bush Treating Physician/Extender: Tito Dine in Treatment:  12 Education Assessment Education Provided To: Patient Education Topics Provided Wound Debridement: Handouts: Wound Debridement Methods: Demonstration, Explain/Verbal Responses: State content correctly Wound/Skin Impairment: Handouts: Caring for Your Ulcer Methods: Demonstration, Explain/Verbal Responses: State content correctly Electronic Signature(s) Signed: 04/24/2019 11:42:17 AM By: Gretta Cool, BSN, RN, CWS, Kim RN, BSN Entered By: Gretta Cool, BSN, RN, CWS, Kim on 04/23/2019 16:03:10 [name] ([id number]) -------------------------------------------------------------------------------- Wound Assessment Details Patient Name: [name] Date of Service: 04/23/2019 3:45 PM Medical Record Number: [id number] Patient Account Number: [id number] Date of Birth/Sex: Sep 12, 1931 (85 y.o. M) Treating RN: Montey Hora Primary Care Buster Schueller: Ria Bush Other Clinician: Referring Thuy Atilano: Ria Bush Treating Nachum Derossett/Extender: Tito Dine in Treatment: 12 Wound Status Wound Number: 1R Primary Neuropathic Ulcer-Non Diabetic Etiology: Wound Location: Left Toe Great Wound Open Wounding Event: Blister Status: Date Acquired: 12/07/2018 Comorbid Cataracts, Chronic sinus problems/congestion, Weeks Of Treatment: 12 History: Hypertension, History of Burn, Gout Clustered Wound: No Photos Wound Measurements Length: (cm) 0.5 Width: (cm) 0.4 Depth: (cm) 0.3 Area: (cm) 0.157 Volume: (cm) 0.047 % Reduction in Area: 50% % Reduction in Volume: 62.7% Epithelialization: None Tunneling: No Undermining: No Wound Description Full Thickness Without Exposed Support Classification: Structures Wound Margin: Flat and Intact Exudate Medium Amount: Exudate Type: Serosanguineous Exudate Color: red, brown Foul Odor After Cleansing: No Slough/Fibrino Yes Wound Bed Granulation Amount: None Present (0%) Exposed Structure Necrotic Amount: Large (67-100%) Fascia Exposed:  No Necrotic Quality: Adherent Slough Fat Layer (Subcutaneous Tissue) Exposed: Yes Tendon Exposed: No Muscle Exposed: No Joint Exposed: No Bone Exposed: No Seeley, Christiano ([id number]) Treatment Notes Wound #1R (Left Toe Great) Notes prisma, foam, conform and offloading shoe Electronic Signature(s) Signed: 04/23/2019 4:12:46 PM By: Montey Hora Entered By: Montey Hora on 04/23/2019 15:47:21 Crespo, Donielle ([id number]) -------------------------------------------------------------------------------- [place] Details Patient Name: [name] Date of Service: 04/23/2019 3:45 PM Medical Record Number: [id number] Patient Account Number: [id number] Date of Birth/Sex: [birth year] (85 y.o. M) Treating RN: Montey Hora Primary Care Shaquera Ansley: Ria Bush Other Clinician: Referring Ryanne Morand: Ria Bush Treating Iwalani Templeton/Extender: Tito Dine in Treatment: 12 Vital Signs Time Taken: 15:40 Temperature (F): 98.1 Height (in): 69 Pulse (bpm): 52 Weight (lbs): 227 Respiratory Rate (breaths/min): 16 Body Mass Index (BMI): 33.5 Blood Pressure (mmHg): 132/51 Reference Range: 80 - 120 mg / dl Electronic Signature(s) Signed: 04/23/2019 4:12:46 PM By: Montey Hora Entered By: Montey Hora on 04/23/2019 15:45:01
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
## 2019-04-27 ENCOUNTER — Other Ambulatory Visit: Payer: Self-pay

## 2019-04-27 ENCOUNTER — Ambulatory Visit
Admission: RE | Admit: 2019-04-27 | Discharge: 2019-04-27 | Disposition: A | Discharge status: Home / Self Care | Payer: Medicare Other | Source: Ambulatory Visit | Attending: Internal Medicine | Admitting: Internal Medicine

## 2019-04-27 DIAGNOSIS — L97521 Non-pressure chronic ulcer of other part of left foot limited to breakdown of skin: Secondary | ICD-10-CM | POA: Insufficient documentation

## 2019-04-27 LAB — POCT I-STAT CREATININE: Creatinine, Ser: 1.3 mg/dL — ABNORMAL HIGH (ref 0.61–1.24)

## 2019-04-27 MED ORDER — GADOBUTROL 1 MMOL/ML IV SOLN
10.0000 mL | Freq: Once | INTRAVENOUS | Status: AC | PRN
  Administered 2019-04-27: 10 mL via INTRAVENOUS

## 2019-04-30 ENCOUNTER — Other Ambulatory Visit: Payer: Self-pay

## 2019-04-30 ENCOUNTER — Encounter: Payer: Medicare Other | Admitting: Internal Medicine

## 2019-04-30 DIAGNOSIS — L97522 Non-pressure chronic ulcer of other part of left foot with fat layer exposed: Secondary | ICD-10-CM | POA: Diagnosis not present

## 2019-05-01 ENCOUNTER — Telehealth: Payer: Self-pay | Admitting: Family Medicine

## 2019-05-01 NOTE — Telephone Encounter (Signed)
I'm sorry to hear this! Would suggest Dr Sharol Given in [place] - let me know if they need referral.

## 2019-05-01 NOTE — Telephone Encounter (Signed)
Spoke with pt relaying Dr. G's message.  Verbalizes understanding and expresses his thanks.  

## 2019-05-01 NOTE — Telephone Encounter (Signed)
Patient's wife called  She stated that the patient was seen by Dr Christell Constant and they had a MRI done on Sunday. It showed the patient has osteomyelitis in his toe. They stated that part of the toe will need to be removed. The patient and his wife would like to know if there is an Network engineer that you would recommend in the [place] area.  She stated they do not want to see a podiatrist

## 2019-05-07 ENCOUNTER — Ambulatory Visit: Payer: Medicare Other

## 2019-05-07 ENCOUNTER — Other Ambulatory Visit: Payer: Self-pay

## 2019-05-07 ENCOUNTER — Encounter: Payer: Medicare Other | Admitting: Internal Medicine

## 2019-05-07 DIAGNOSIS — L97522 Non-pressure chronic ulcer of other part of left foot with fat layer exposed: Secondary | ICD-10-CM | POA: Diagnosis not present

## 2019-05-08 ENCOUNTER — Encounter: Payer: Self-pay | Admitting: Orthopedic Surgery

## 2019-05-08 ENCOUNTER — Ambulatory Visit (INDEPENDENT_AMBULATORY_CARE_PROVIDER_SITE_OTHER): Payer: Medicare Other | Admitting: Orthopedic Surgery

## 2019-05-08 VITALS — Ht 68.0 in | Wt 234.0 lb

## 2019-05-08 DIAGNOSIS — I6523 Occlusion and stenosis of bilateral carotid arteries: Secondary | ICD-10-CM

## 2019-05-08 DIAGNOSIS — M869 Osteomyelitis, unspecified: Secondary | ICD-10-CM
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
## 2019-05-08 NOTE — Progress Notes (Signed)
[name] ([id number]) Visit Report for 05/07/2019 Arrival Information Details Patient Name: [name], [name] Date of Service: 05/07/2019 9:15 AM Medical Record Number: [id number] Patient Account Number: [id number] Date of Birth/Sex: February 13, 1932 (85 y.o. M) Treating RN: Montey Hora Primary Care Jadene Stemmer: Ria Bush Other Clinician: Referring Anhelica Fowers: Ria Bush Treating Elizbeth Posa/Extender: Tito Dine in Treatment: 14 Visit Information History Since Last Visit Added or deleted any medications: No Patient Arrived: Ambulatory Any new allergies or adverse reactions: No Arrival Time: 09:42 Had a fall or experienced change in No Accompanied By: self activities of daily living that may affect Transfer Assistance: None risk of falls: Patient Identification Verified: Yes Signs or symptoms of abuse/neglect since last No Secondary Verification Process Yes visito Completed: Hospitalized since last visit: No Patient Requires Transmission-Based No Implantable device outside of the clinic No Precautions: excluding Patient Has Alerts: Yes cellular tissue based products placed in the Patient Alerts: Patient on Blood center Thinner since last visit: Has Dressing in Place as Prescribed: Yes Has Footwear/Offloading in Place as Yes Prescribed: Left: Surgical Shoe with Pressure Relief Insole Pain Present Now: No Electronic Signature(s) Signed: 05/07/2019 4:52:23 PM By: Montey Hora Entered By: Montey Hora on 05/07/2019 09:43:39 Paff, Lajuane ([id number]) -------------------------------------------------------------------------------- Clinic Level of Care Assessment Details Patient Name: [name] Date of Service: 05/07/2019 9:15 AM Medical Record Number: [id number] Patient Account Number: [id number] Date of Birth/Sex: Jun 07, 1931 (85 y.o. M) Treating RN: Harold Barban Primary Care Melayna Robarts: Ria Bush Other Clinician: Referring  Leah Skora: Ria Bush Treating Urbano Milhouse/Extender: Tito Dine in Treatment: 14 Clinic Level of Care Assessment Items TOOL 4 Quantity Score []  - Use when only an EandM is performed on FOLLOW-UP visit 0 ASSESSMENTS - Nursing Assessment / Reassessment X - Reassessment of Co-morbidities (includes updates in patient status) 1 10 X- 1 5 Reassessment of Adherence to Treatment Plan ASSESSMENTS - Wound and Skin Assessment / Reassessment X - Simple Wound Assessment / Reassessment - one wound 1 5 []  - 0 Complex Wound Assessment / Reassessment - multiple wounds []  - 0 Dermatologic / Skin Assessment (not related to wound area) ASSESSMENTS - Focused Assessment []  - Circumferential Edema Measurements - multi extremities 0 []  - 0 Nutritional Assessment / Counseling / Intervention []  - 0 Lower Extremity Assessment (monofilament, tuning fork, pulses) []  - 0 Peripheral Arterial Disease Assessment (using hand held doppler) ASSESSMENTS - Ostomy and/or Continence Assessment and Care []  - Incontinence Assessment and Management 0 []  - 0 Ostomy Care Assessment and Management (repouching, etc.) PROCESS - Coordination of Care X - Simple Patient / Family Education for ongoing care 1 15 []  - 0 Complex (extensive) Patient / Family Education for ongoing care []  - 0 Staff obtains Programmer, systems, Records, Test Results / Process Orders []  - 0 Staff telephones HHA, Nursing Homes / Clarify orders / etc []  - 0 Routine Transfer to another Facility (non-emergent condition) []  - 0 Routine Hospital Admission (non-emergent condition) []  - 0 New Admissions / Biomedical engineer / Ordering NPWT, Apligraf, etc. []  - 0 Emergency Hospital Admission (emergent condition) X- 1 10 Simple Discharge Coordination KIYAN, MCCLAVE ([id number]) []  - 0 Complex (extensive) Discharge Coordination PROCESS - Special Needs []  - Pediatric / Minor Patient Management 0 []  - 0 Isolation Patient Management []  -  0 Hearing / Language / Visual special needs []  - 0 Assessment of Community assistance (transportation, D/C planning, etc.) []  - 0 Additional assistance / Altered mentation []  - 0 Support Surface(s) Assessment (bed, cushion, seat, etc.) INTERVENTIONS - Wound Cleansing / Measurement  X - Simple Wound Cleansing - one wound 1 5 []  - 0 Complex Wound Cleansing - multiple wounds X- 1 5 Wound Imaging (photographs - any number of wounds) []  - 0 Wound Tracing (instead of photographs) X- 1 5 Simple Wound Measurement - one wound []  - 0 Complex Wound Measurement - multiple wounds INTERVENTIONS - Wound Dressings X - Small Wound Dressing one or multiple wounds 1 10 []  - 0 Medium Wound Dressing one or multiple wounds []  - 0 Large Wound Dressing one or multiple wounds []  - 0 Application of Medications - topical []  - 0 Application of Medications - injection INTERVENTIONS - Miscellaneous []  - External ear exam 0 []  - 0 Specimen Collection (cultures, biopsies, blood, body fluids, etc.) []  - 0 Specimen(s) / Culture(s) sent or taken to Lab for analysis []  - 0 Patient Transfer (multiple staff / Civil Service fast streamer / Similar devices) []  - 0 Simple Staple / Suture removal (25 or less) []  - 0 Complex Staple / Suture removal (26 or more) []  - 0 Hypo / Hyperglycemic Management (close monitor of Blood Glucose) []  - 0 Ankle / Brachial Index (ABI) - do not check if billed separately X- 1 5 Vital Signs Grefe, [place] ([id number]) Has the patient been seen at the hospital within the last three years: Yes Total Score: 75 Level Of Care: New/Established - Level 2 Electronic Signature(s) Signed: 05/07/2019 3:13:29 PM By: Harold Barban Entered By: Harold Barban on 05/07/2019 10:08:14 [name] ([id number]) -------------------------------------------------------------------------------- Encounter Discharge Information Details Patient Name: [name] Date of Service: 05/07/2019 9:15  AM Medical Record Number: [id number] Patient Account Number: [id number] Date of Birth/Sex: 13-Jun-1931 (85 y.o. M) Treating RN: Harold Barban Primary Care Margarete Horace: Ria Bush Other Clinician: Referring Daylen Lipsky: Ria Bush Treating Devonte Migues/Extender: Tito Dine in Treatment: 14 Encounter Discharge Information Items Discharge Condition: Stable Ambulatory Status: Ambulatory Discharge Destination: Home Transportation: Private Auto Accompanied By: self Schedule Follow-up Appointment: Yes Clinical Summary of Care: Electronic Signature(s) Signed: 05/07/2019 3:13:29 PM By: Harold Barban Entered By: Harold Barban on 05/07/2019 10:13:59 Boer, Shelly Flatten ([id number]) -------------------------------------------------------------------------------- Lower Extremity Assessment Details Patient Name: [name] Date of Service: 05/07/2019 9:15 AM Medical Record Number: [id number] Patient Account Number: [id number] Date of Birth/Sex: [birth year] (85 y.o. M) Treating RN: Montey Hora Primary Care Wynter Grave: Ria Bush Other Clinician: Referring Kainen Struckman: Ria Bush Treating Kenesha Moshier/Extender: Ricard Dillon Weeks in Treatment: 14 Edema Assessment Assessed: [Left: No] [Right: No] Edema: [Left: N] [Right: o] Vascular Assessment Pulses: Dorsalis Pedis Palpable: [Left:Yes] Electronic Signature(s) Signed: 05/07/2019 4:52:23 PM By: Montey Hora Entered By: Montey Hora on 05/07/2019 09:51:43 Recktenwald, Jaylyn ([id number]) -------------------------------------------------------------------------------- Multi Wound Chart Details Patient Name: [name] Date of Service: 05/07/2019 9:15 AM Medical Record Number: [id number] Patient Account Number: [id number] Date of Birth/Sex: 1931/07/28 (85 y.o. M) Treating RN: Harold Barban Primary Care Minami Arriaga: Ria Bush Other Clinician: Referring Yulianna Folse: Ria Bush Treating  Bernadette Gores/Extender: Tito Dine in Treatment: 14 Vital Signs Height(in): 69 Pulse(bpm): 63 Weight(lbs): 227 Blood Pressure(mmHg): 151/67 Body Mass Index(BMI): 34 Temperature(F): 97.5 Respiratory Rate 16 (breaths/min): Photos: [N/A:N/A] Wound Location: Left Toe Great N/A N/A Wounding Event: Blister N/A N/A Primary Etiology: Neuropathic Ulcer-Non N/A N/A Diabetic Comorbid History: Cataracts, Chronic sinus N/A N/A problems/congestion, Hypertension, History of Burn, Gout Date Acquired: 12/07/2018 N/A N/A Weeks of Treatment: 14 N/A N/A Wound Status: Open N/A N/A Wound Recurrence: Yes N/A N/A Measurements L x W x D 1x0.9x0.4 N/A N/A (cm) Area (cm) : 0.707 N/A N/A Volume (cm) : 0.283 N/A N/A %  Reduction in Area: -125.20% N/A N/A % Reduction in Volume: -124.60% N/A N/A Classification: Full Thickness Without N/A N/A Exposed Support Structures Exudate Amount: Medium N/A N/A Exudate Type: Serosanguineous N/A N/A Exudate Color: red, brown N/A N/A Wound Margin: Flat and Intact N/A N/A Granulation Amount: Medium (34-66%) N/A N/A Necrotic Amount: Medium (34-66%) N/A N/A Exposed Structures: Fat Layer (Subcutaneous N/A N/A Tissue) Exposed: Yes Fascia: No Hunke, Zavion ([id number]) Tendon: No Muscle: No Joint: No Bone: No Epithelialization: None N/A N/A Treatment Notes Wound #1R (Left Toe Great) Notes Silver alginate foam, conform and offloading shoe Electronic Signature(s) Signed: 05/07/2019 4:48:37 PM By: Linton Ham MD Entered By: Linton Ham on 05/07/2019 10:31:44 [name] ([id number]) -------------------------------------------------------------------------------- Multi-Disciplinary Care Plan Details Patient Name: [name] Date of Service: 05/07/2019 9:15 AM Medical Record Number: [id number] Patient Account Number: [id number] Date of Birth/Sex: 1931/12/14 (85 y.o. M) Treating RN: Harold Barban Primary Care Trinidee Schrag: Ria Bush Other Clinician: Referring Daquane Aguilar: Ria Bush Treating Desjuan Stearns/Extender: Tito Dine in Treatment: 14 Active Inactive Electronic Signature(s) Signed: 05/07/2019 3:13:29 PM By: Harold Barban Entered By: Harold Barban on 05/07/2019 10:13:08 [name] ([id number]) -------------------------------------------------------------------------------- Pain Assessment Details Patient Name: [name] Date of Service: 05/07/2019 9:15 AM Medical Record Number: [id number] Patient Account Number: [id number] Date of Birth/Sex: 1931/09/06 (85 y.o. M) Treating RN: Army Melia Primary Care Corin Tilly: Ria Bush Other Clinician: Referring Tommy Goostree: Ria Bush Treating Mikalah Skyles/Extender: Tito Dine in Treatment: 14 Active Problems Location of Pain Severity and Description of Pain Patient Has Paino No Site Locations Pain Management and Medication Current Pain Management: Electronic Signature(s) Signed: 05/07/2019 9:57:21 AM By: Army Melia Signed: 05/07/2019 4:52:23 PM By: Montey Hora Entered By: Montey Hora on 05/07/2019 09:49:05 Gilbo, Lennis ([id number]) -------------------------------------------------------------------------------- Patient/Caregiver Education Details Patient Name: [name] Date of Service: 05/07/2019 9:15 AM Medical Record Number: [id number] Patient Account Number: [id number] Date of Birth/Gender: 05/28/1931 (85 y.o. M) Treating RN: Harold Barban Primary Care Physician: Ria Bush Other Clinician: Referring Physician: Ria Bush Treating Physician/Extender: Tito Dine in Treatment: 14 Education Assessment Education Provided To: Patient Education Topics Provided Wound/Skin Impairment: Handouts: Caring for Your Ulcer Methods: Demonstration, Explain/Verbal Responses: State content correctly Electronic Signature(s) Signed: 05/07/2019 3:13:29 PM By: Harold Barban Entered By: Harold Barban on 05/07/2019 10:07:43 Eckard, Shelly Flatten ([id number]) -------------------------------------------------------------------------------- Wound Assessment Details Patient Name: [name] Date of Service: 05/07/2019 9:15 AM Medical Record Number: [id number] Patient Account Number: [id number] Date of Birth/Sex: Aug 07, 1931 (85 y.o. M) Treating RN: Montey Hora Primary Care Ewen Varnell: Ria Bush Other Clinician: Referring Alainna Stawicki: Ria Bush Treating Shanan Fitzpatrick/Extender: Tito Dine in Treatment: 14 Wound Status Wound Number: 1R Primary Neuropathic Ulcer-Non Diabetic Etiology: Wound Location: Left Toe Great Wound Open Wounding Event: Blister Status: Date Acquired: 12/07/2018 Comorbid Cataracts, Chronic sinus problems/congestion, Weeks Of Treatment: 14 History: Hypertension, History of Burn, Gout Clustered Wound: No Photos Wound Measurements Length: (cm) 1 Width: (cm) 0.9 Depth: (cm) 0.4 Area: (cm) 0.707 Volume: (cm) 0.283 % Reduction in Area: -125.2% % Reduction in Volume: -124.6% Epithelialization: None Tunneling: No Undermining: No Wound Description Full Thickness Without Exposed Support Classification: Structures Wound Margin: Flat and Intact Exudate Medium Amount: Exudate Type: Serosanguineous Exudate Color: red, brown Foul Odor After Cleansing: No Slough/Fibrino Yes Wound Bed Granulation Amount: Medium (34-66%) Exposed Structure Necrotic Amount: Medium (34-66%) Fascia Exposed: No Necrotic Quality: Adherent Slough Fat Layer (Subcutaneous Tissue) Exposed: Yes Tendon Exposed: No Muscle Exposed: No Joint Exposed: No Bone Exposed: No Jett, Aragon ([id number]) Treatment Notes Wound #1R (Left Toe Great)  Notes Silver alginate foam, conform and offloading shoe Electronic Signature(s) Signed: 05/07/2019 4:52:23 PM By: Montey Hora Entered By: Montey Hora on 05/07/2019 09:51:16 Mccathern,  Cochise ([id number]) -------------------------------------------------------------------------------- [place] Details Patient Name: [name] Date of Service: 05/07/2019 9:15 AM Medical Record Number: [id number] Patient Account Number: [id number] Date of Birth/Sex: [birth year] (85 y.o. M) Treating RN: Army Melia Primary Care Jaquay Posthumus: Ria Bush Other Clinician: Referring Seanna Sisler: Ria Bush Treating Medrith Veillon/Extender: Tito Dine in Treatment: 14 Vital Signs Time Taken: 09:43 Temperature (F): 97.5 Height (in): 69 Pulse (bpm): 63 Weight (lbs): 227 Respiratory Rate (breaths/min): 16 Body Mass Index (BMI): 33.5 Blood Pressure (mmHg): 151/67 Reference Range: 80 - 120 mg / dl Electronic Signature(s) Signed: 05/07/2019 4:52:23 PM By: Montey Hora Entered By: Montey Hora on 05/07/2019 09:49:23

## 2019-05-12 ENCOUNTER — Encounter: Payer: Self-pay | Admitting: Orthopedic Surgery

## 2019-05-12 NOTE — Progress Notes (Signed)
Office Visit Note   Patient: [name]           Date of Birth: Feb 25, 1932           MRN: [id number] Visit Date: 05/08/2019              Requested by: Ria Bush, MD [address] [place],  [place] [address] PCP: Ria Bush, MD  Chief Complaint  Patient presents with  . Left Foot - Pain    oseto left GT       HPI: Patient is an 86 year old gentleman who is seen in referral from Dr. Dellia Nims at the wound center.  Patient states he got an argument with Dr. Dellia Nims stating that Dr. Dellia Nims did not want him to be referred here.  Patient states that he started treatment for the osteomyelitis of the left great toe when he was in [place] about 3 years ago patient states he was treated with 6 weeks of IV antibiotics through a PICC line.  Patient is currently been treated with Levaquin and just finished it prior to the office visit.  He has been undergoing dressing changes with silver alginate dressing a felt pad.  Assessment & Plan: Visit Diagnoses:  1. Osteomyelitis of great toe of left foot ([place])     Plan: With the chronic osteomyelitis of the great toe failure of conservative wound care and IV antibiotics patient's best option is to proceed with a amputation of the left great toe through the MTP joint.  Risks and benefits were discussed including risk of the wound not healing need for additional surgery.  Patient states he understands and wished to proceed at this time we will set up surgery as an outpatient.  Follow-Up Instructions: Return in about 2 weeks (around 05/22/2019).   Ortho Exam  Patient is alert, oriented, no adenopathy, well-dressed, normal affect, normal respiratory effort. Examination patient has a strong dorsalis pedis pulse he does have venous stasis swelling but no ulcers.  Patient states that the ulcer plantar aspect of the great toe is been there for years.  Patient does have swelling redness and ulceration.  Review of his MRI scan obtained on  December 20 [facility] shows enhancement of the tuft of the left great toe consistent with osteomyelitis.  Imaging: No results found. No images are attached to the encounter.  Labs: Lab Results  Component Value Date   HGBA1C 5.6 02/12/2019   HGBA1C 5.9 03/12/2015   LABURIC 4.2 02/05/2019   LABURIC 4.6 08/27/2017   LABURIC 4.4 08/23/2016   REPTSTATUS 04/20/2019 FINAL 04/16/2019   GRAMSTAIN  04/16/2019    NO WBC SEEN NO ORGANISMS SEEN Performed at [facility], [place] [address], [place],  [address]    CULT RARE STAPHYLOCOCCUS LUGDUNENSIS 04/16/2019   LABORGA STAPHYLOCOCCUS LUGDUNENSIS 04/16/2019     Lab Results  Component Value Date   ALBUMIN 4.0 02/05/2019   ALBUMIN 4.2 08/27/2017   ALBUMIN 4.1 08/23/2016   LABURIC 4.2 02/05/2019   LABURIC 4.6 08/27/2017   LABURIC 4.4 08/23/2016    No results found for: MG Lab Results  Component Value Date   VD25OH 39.4 03/12/2015    No results found for: PREALBUMIN CBC EXTENDED Latest Ref Rng & Units 03/12/2015  WBC 10:3/mL 4.7  HGB 13.5 - 17.5 g/dL 13.2(A)  PLT 150 - 399 K/L 224     Body mass index is 35.58 kg/m.  Orders:  No orders of the defined types were placed in  this encounter.  No orders of the defined types were placed in this encounter.    Procedures: No procedures performed  Clinical Data: No additional findings.  ROS:  All other systems negative, except as noted in the HPI. Review of Systems  Objective: Vital Signs: Ht 5\' 8"  (1.727 m)   Wt 234 lb (106.1 kg)   BMI 35.58 kg/m   Specialty Comments:  No specialty comments available.  PMFS History: Patient Active Problem List   Diagnosis Date Noted  . Medicare annual wellness visit, subsequent 02/12/2019  . Hyperglycemia 02/12/2019  . Pre-ulcerative calluses 12/02/2018  . Infection of left great toe due to methicillin resistant Staphylococcus aureus (MRSA) 12/02/2018  . Nasal sinus congestion 11/14/2017  .  Advanced care planning/counseling discussion 09/04/2017  . Dyslipidemia 09/04/2017  . Leg cramps 03/07/2017  . Carotid stenosis, asymptomatic, bilateral 09/16/2016  . Transaminitis 09/01/2016  . OSA on CPAP 09/01/2016  . Sensorineural hearing loss (SNHL) of both ears 09/01/2016  . Obesity, Class I, BMI 30.0-34.9 (see actual BMI) 09/01/2016  . Open toe wound, sequela 08/31/2016  . GERD (gastroesophageal reflux disease)   . Gout   . HTN (hypertension)   . Osteoarthritis   . History of osteomyelitis 10/07/2015  . Personal history of malignant neoplasm of larynx 05/09/2007   Past Medical History:  Diagnosis Date  . Carotid stenosis, asymptomatic, bilateral 09/16/2016   Diffuse 1-39% bilaterally, but involving ICA, ECA, CCA - rpt 1 yr (09/2016)  . Diverticulitis   . GERD (gastroesophageal reflux disease)   . Gout   . History of osteomyelitis 10/2015   left foot  . History of squamous cell carcinoma of skin   . HTN (hypertension)   . OSA on CPAP 09/01/2016  . Osteoarthritis    back, neck, knees - multiple joints  . Personal history of malignant neoplasm of larynx 2009   s/p XRT  . Seasonal allergies   . Sensorineural hearing loss (SNHL) of both ears 09/01/2016    Family History  Problem Relation Age of Onset  . Hypertension Mother   . Hypertension Father   . Cancer Other        unknown - niece    Past Surgical History:  Procedure Laterality Date  . ACHILLES TENDON REPAIR    . APPENDECTOMY    . THROAT SURGERY N/A 2009   ?vocal cord vs larynx nodule removal, had XRT after this but states it was not cancer  . TONSILLECTOMY    . TREATMENT FISTULA ANAL     Social History   Occupational History  . Not on file  Tobacco Use  . Smoking status: Never Smoker  . Smokeless tobacco: Never Used  Substance and Sexual Activity  . Alcohol use: Yes    Comment: 3 glasses wine per week  . Drug use: No  . Sexual activity: Not on file

## 2019-05-13 ENCOUNTER — Other Ambulatory Visit
Admission: RE | Admit: 2019-05-13 | Discharge: 2019-05-13 | Disposition: A | Discharge status: Home / Self Care | Payer: Medicare Other | Source: Ambulatory Visit | Attending: Orthopedic Surgery | Admitting: Orthopedic Surgery

## 2019-05-13 DIAGNOSIS — Z20822 Contact with and (suspected) exposure to covid-19: Secondary | ICD-10-CM | POA: Insufficient documentation

## 2019-05-13 DIAGNOSIS — M5416 Radiculopathy, lumbar region: Secondary | ICD-10-CM | POA: Diagnosis not present

## 2019-05-13 DIAGNOSIS — Z01812 Encounter for preprocedural laboratory examination: Secondary | ICD-10-CM | POA: Diagnosis not present

## 2019-05-13 DIAGNOSIS — M9905 Segmental and somatic dysfunction of pelvic region: Secondary | ICD-10-CM | POA: Diagnosis not present

## 2019-05-13 DIAGNOSIS — M9903 Segmental and somatic dysfunction of lumbar region: Secondary | ICD-10-CM | POA: Diagnosis not present

## 2019-05-13 DIAGNOSIS — M955 Acquired deformity of pelvis: Secondary | ICD-10-CM | POA: Diagnosis not present

## 2019-05-14 LAB — SARS CORONAVIRUS 2 (TAT 6-24 HRS): SARS Coronavirus 2: NEGATIVE

## 2019-05-14 NOTE — Progress Notes (Signed)
NIMA, SILL ([id number]) Visit Report for 04/30/2019 Arrival Information Details Patient Name: [name], [name] Date of Service: 04/30/2019 1:30 PM Medical Record Number: [id number] Patient Account Number: [id number] Date of Birth/Sex: [birth year] (86 y.o. M) Treating RN: Cornell Barman Primary Care Mabel Roll: Ria Bush Other Clinician: Referring Jensen Kilburg: Ria Bush Treating Josiyah Tozzi/Extender: Tito Dine in Treatment: 13 Visit Information History Since Last Visit Added or deleted any medications: No Patient Arrived: Ambulatory Any new allergies or adverse reactions: No Arrival Time: 13:44 Had a fall or experienced change in No Accompanied By: wife activities of daily living that may affect Transfer Assistance: None risk of falls: Patient Identification Verified: Yes Signs or symptoms of abuse/neglect since last visito No Secondary Verification Process Yes Hospitalized since last visit: No Completed: Has Dressing in Place as Prescribed: Yes Patient Requires Transmission-Based No Pain Present Now: No Precautions: Patient Has Alerts: Yes Patient Alerts: Patient on Blood Thinner Electronic Signature(s) Signed: 05/14/2019 4:51:46 PM By: Gretta Cool, BSN, RN, CWS, Kim RN, BSN Entered By: Gretta Cool, BSN, RN, CWS, Kim on 04/30/2019 13:47:55 [name] ([id number]) -------------------------------------------------------------------------------- Encounter Discharge Information Details Patient Name: [name] Date of Service: 04/30/2019 1:30 PM Medical Record Number: [id number] Patient Account Number: [id number] Date of Birth/Sex: Nov 18, 1931 (87 y.o. M) Treating RN: Cornell Barman Primary Care Rudine Rieger: Ria Bush Other Clinician: Referring Caly Pellum: Ria Bush Treating Hildred Mollica/Extender: Tito Dine in Treatment: 13 Encounter Discharge Information Items Post Procedure Vitals Discharge Condition: Stable Temperature (F):  97.3 Ambulatory Status: Ambulatory Pulse (bpm): 61 Discharge Destination: Home Respiratory Rate (breaths/min): 16 Transportation: Private Auto Blood Pressure (mmHg): 147/55 Accompanied By: self Schedule Follow-up Appointment: Yes Clinical Summary of Care: Electronic Signature(s) Signed: 05/14/2019 4:51:46 PM By: Gretta Cool, BSN, RN, CWS, Kim RN, BSN Entered By: Gretta Cool, BSN, RN, CWS, Kim on 04/30/2019 14:23:45 [name] ([id number]) -------------------------------------------------------------------------------- Lower Extremity Assessment Details Patient Name: [name] Date of Service: 04/30/2019 1:30 PM Medical Record Number: [id number] Patient Account Number: [id number] Date of Birth/Sex: [birth year] (87 y.o. M) Treating RN: Army Melia Primary Care Leonarda Leis: Ria Bush Other Clinician: Referring Avanell Banwart: Ria Bush Treating Chaka Boyson/Extender: Ricard Dillon Weeks in Treatment: 13 Edema Assessment Assessed: [Left: No] [Right: No] Edema: [Left: N] [Right: o] Vascular Assessment Pulses: Dorsalis Pedis Palpable: [Left:Yes] Electronic Signature(s) Signed: 04/30/2019 4:11:28 PM By: Army Melia Entered By: Army Melia on 04/30/2019 13:52:11 Ybarbo, Rajesh ([id number]) -------------------------------------------------------------------------------- Multi Wound Chart Details Patient Name: [name] Date of Service: 04/30/2019 1:30 PM Medical Record Number: [id number] Patient Account Number: [id number] Date of Birth/Sex: 10/05/1931 (87 y.o. M) Treating RN: Cornell Barman Primary Care Vieno Tarrant: Ria Bush Other Clinician: Referring Nino Amano: Ria Bush Treating Manley Fason/Extender: Tito Dine in Treatment: 13 Vital Signs Height(in): 69 Pulse(bpm): 61 Weight(lbs): 227 Blood Pressure(mmHg): 147/55 Body Mass Index(BMI): 34 Temperature(F): 97.3 Respiratory Rate 18 (breaths/min): Photos: [N/A:N/A] Wound Location: Left Toe  Great N/A N/A Wounding Event: Blister N/A N/A Primary Etiology: Neuropathic Ulcer-Non N/A N/A Diabetic Comorbid History: Cataracts, Chronic sinus N/A N/A problems/congestion, Hypertension, History of Burn, Gout Date Acquired: 12/07/2018 N/A N/A Weeks of Treatment: 13 N/A N/A Wound Status: Open N/A N/A Wound Recurrence: Yes N/A N/A Measurements L x W x D 0.5x0.5x0.3 N/A N/A (cm) Area (cm) : 0.196 N/A N/A Volume (cm) : 0.059 N/A N/A % Reduction in Area: 37.60% N/A N/A % Reduction in Volume: 53.20% N/A N/A Starting Position 1 12 (o'clock): Ending Position 1 12 (o'clock): Maximum Distance 1 (cm): 0.3 Undermining: Yes N/A N/A Classification: Full Thickness Without N/A N/A Exposed Support Structures Exudate Amount: Medium  N/A N/A Exudate Type: Serosanguineous N/A N/A Exudate Color: red, brown N/A N/A Mayr, Matthewjames ([id number]) Wound Margin: Flat and Intact N/A N/A Granulation Amount: Small (1-33%) N/A N/A Necrotic Amount: Large (67-100%) N/A N/A Exposed Structures: Fat Layer (Subcutaneous N/A N/A Tissue) Exposed: Yes Fascia: No Tendon: No Muscle: No Joint: No Bone: No Epithelialization: None N/A N/A Debridement: Debridement - Excisional N/A N/A Pre-procedure 14:11 N/A N/A Verification/Time Out Taken: Pain Control: Lidocaine N/A N/A Tissue Debrided: Callus, Subcutaneous, Slough N/A N/A Level: Skin/Subcutaneous Tissue N/A N/A Debridement Area (sq cm): 0.25 N/A N/A Instrument: Curette N/A N/A Bleeding: Minimum N/A N/A Hemostasis Achieved: Pressure N/A N/A Debridement Treatment Procedure was tolerated well N/A N/A Response: Post Debridement 0.5x0.5x0.5 N/A N/A Measurements L x W x D (cm) Post Debridement Volume: 0.098 N/A N/A (cm) Procedures Performed: Debridement N/A N/A Treatment Notes Wound #1R (Left Toe Great) Notes prisma, foam, conform and offloading shoe Electronic Signature(s) Signed: 04/30/2019 5:48:31 PM By: Linton Ham MD Entered By: Linton Ham on 04/30/2019 14:49:58 [place] ([id number]) -------------------------------------------------------------------------------- Multi-Disciplinary Care Plan Details Patient Name: [name] Date of Service: 04/30/2019 1:30 PM Medical Record Number: [id number] Patient Account Number: [id number] Date of Birth/Sex: 09-29-1931 (87 y.o. M) Treating RN: Cornell Barman Primary Care Jhordan Mckibben: Ria Bush Other Clinician: Referring Fayette Hamada: Ria Bush Treating Gloristine Turrubiates/Extender: Tito Dine in Treatment: 13 Active Inactive Medication Nursing Diagnoses: Knowledge deficit related to medication safety: actual or potential Goals: Patient/caregiver will demonstrate understanding of all current medications Date Initiated: 01/29/2019 Target Resolution Date: 01/29/2019 Goal Status: Active Interventions: Patient/Caregiver given reconciled medication list upon admission, changes in medications and discharge from the [place] Notes: Necrotic Tissue Nursing Diagnoses: Impaired tissue integrity related to necrotic/devitalized tissue Goals: Necrotic/devitalized tissue will be minimized in the wound bed Date Initiated: 02/12/2019 Target Resolution Date: 02/19/2019 Goal Status: Active Interventions: Assess patient pain level pre-, during and post procedure and prior to discharge Treatment Activities: Apply topical anesthetic as ordered : 02/12/2019 Notes: Orientation to the Wound Care Program Nursing Diagnoses: Knowledge deficit related to the wound healing center program Goals: Patient/caregiver will verbalize understanding of the [place] [facility] Date Initiated: 01/29/2019 Target Resolution Date: 03/05/2019 ARNAB, SOLURI ([id number]) Goal Status: Active Interventions: Provide education on orientation to the wound center Notes: Wound/Skin Impairment Nursing Diagnoses: Knowledge deficit related to ulceration/compromised skin  integrity Goals: Patient/caregiver will verbalize understanding of skin care regimen Date Initiated: 01/29/2019 Target Resolution Date: 02/05/2019 Goal Status: Active Ulcer/skin breakdown will have a volume reduction of 30% by week 4 Date Initiated: 01/29/2019 Target Resolution Date: 02/19/2019 Goal Status: Active Interventions: Assess patient/caregiver ability to obtain necessary supplies Assess ulceration(s) every visit Treatment Activities: Skin care regimen initiated : 01/29/2019 Notes: Electronic Signature(s) Signed: 05/14/2019 4:51:46 PM By: Gretta Cool, BSN, RN, CWS, Kim RN, BSN Entered By: Gretta Cool, BSN, RN, CWS, Kim on 04/30/2019 14:08:19 [name] ([id number]) -------------------------------------------------------------------------------- Pain Assessment Details Patient Name: [name] Date of Service: 04/30/2019 1:30 PM Medical Record Number: [id number] Patient Account Number: [id number] Date of Birth/Sex: 1931-08-11 (87 y.o. M) Treating RN: Cornell Barman Primary Care Khan Chura: Ria Bush Other Clinician: Referring Namine Beahm: Ria Bush Treating Laterrian Hevener/Extender: Tito Dine in Treatment: 13 Active Problems Location of Pain Severity and Description of Pain Patient Has Paino No Site Locations Pain Management and Medication Current Pain Management: Electronic Signature(s) Signed: 05/14/2019 4:51:46 PM By: Gretta Cool, BSN, RN, CWS, Kim RN, BSN Entered By: Gretta Cool, BSN, RN, CWS, Kim on 04/30/2019 13:48:04 [name] ([id number]) -------------------------------------------------------------------------------- Patient/Caregiver Education Details Patient Name: [name]  Date of Service: 04/30/2019 1:30 PM Medical Record Number: [id number] Patient Account Number: [id number] Date of Birth/Gender: 06-27-1931 (86 y.o. M) Treating RN: Cornell Barman Primary Care Physician: Ria Bush Other Clinician: Referring Physician: Ria Bush Treating  Physician/Extender: Tito Dine in Treatment: 13 Education Assessment Education Provided To: Patient Education Topics Provided Wound/Skin Impairment: Handouts: Caring for Your Ulcer Methods: Demonstration, Explain/Verbal Responses: State content correctly Electronic Signature(s) Signed: 05/14/2019 4:51:46 PM By: Gretta Cool, BSN, RN, CWS, Kim RN, BSN Entered By: Gretta Cool, BSN, RN, CWS, Kim on 04/30/2019 14:15:55 [name] ([id number]) -------------------------------------------------------------------------------- Wound Assessment Details Patient Name: [name] Date of Service: 04/30/2019 1:30 PM Medical Record Number: [id number] Patient Account Number: [id number] Date of Birth/Sex: [birth year] (87 y.o. M) Treating RN: Army Melia Primary Care Donnia Poplaski: Ria Bush Other Clinician: Referring Josel Keo: Ria Bush Treating Merwyn Hodapp/Extender: Tito Dine in Treatment: 13 Wound Status Wound Number: 1R Primary Neuropathic Ulcer-Non Diabetic Etiology: Wound Location: Left Toe Great Wound Open Wounding Event: Blister Status: Date Acquired: 12/07/2018 Comorbid Cataracts, Chronic sinus problems/congestion, Weeks Of Treatment: 13 History: Hypertension, History of Burn, Gout Clustered Wound: No Photos Wound Measurements Length: (cm) 0.5 Width: (cm) 0.5 Depth: (cm) 0.3 Area: (cm) 0.196 Volume: (cm) 0.059 % Reduction in Area: 37.6% % Reduction in Volume: 53.2% Epithelialization: None Tunneling: No Undermining: Yes Starting Position (o'clock): 12 Ending Position (o'clock): 12 Maximum Distance: (cm) 0.3 Wound Description Full Thickness Without Exposed Support Classification: Structures Wound Margin: Flat and Intact Exudate Medium Amount: Exudate Type: Serosanguineous Exudate Color: red, brown Foul Odor After Cleansing: No Slough/Fibrino Yes Wound Bed Granulation Amount: Small (1-33%) Exposed Structure Necrotic Amount: Large  (67-100%) Fascia Exposed: No Necrotic Quality: Adherent Slough Fat Layer (Subcutaneous Tissue) Exposed: Yes Tendon Exposed: No Rahm, Welles ([id number]) Muscle Exposed: No Joint Exposed: No Bone Exposed: No Electronic Signature(s) Signed: 04/30/2019 4:11:28 PM By: Army Melia Signed: 05/14/2019 4:51:46 PM By: Gretta Cool, BSN, RN, CWS, Kim RN, BSN Entered By: Gretta Cool, BSN, RN, CWS, Kim on 04/30/2019 14:08:09 ELIZER, SZYMBORSKI ([id number]) -------------------------------------------------------------------------------- [place] Details Patient Name: [name] Date of Service: 04/30/2019 1:30 PM Medical Record Number: [id number] Patient Account Number: [id number] Date of Birth/Sex: 09/07/1931 (86 y.o. M) Treating RN: Cornell Barman Primary Care Trina Asch: Ria Bush Other Clinician: Referring Candelario Steppe: Ria Bush Treating Breck Hollinger/Extender: Tito Dine in Treatment: 13 Vital Signs Time Taken: 13:48 Temperature (F): 97.3 Height (in): 69 Pulse (bpm): 61 Weight (lbs): 227 Respiratory Rate (breaths/min): 18 Body Mass Index (BMI): 33.5 Blood Pressure (mmHg): 147/55 Reference Range: 80 - 120 mg / dl Electronic Signature(s) Signed: 05/14/2019 4:51:46 PM By: Gretta Cool, BSN, RN, CWS, Kim RN, BSN Entered By: Gretta Cool, BSN, RN, CWS, Kim on 04/30/2019 13:50:25
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
## 2019-05-15 ENCOUNTER — Telehealth: Payer: Self-pay

## 2019-05-15 ENCOUNTER — Ambulatory Visit (INDEPENDENT_AMBULATORY_CARE_PROVIDER_SITE_OTHER)
Admission: RE | Admit: 2019-05-15 | Discharge: 2019-05-15 | Disposition: A | Discharge status: Home / Self Care | Payer: Medicare Other | Source: Ambulatory Visit | Attending: Family Medicine | Admitting: Family Medicine

## 2019-05-15 ENCOUNTER — Other Ambulatory Visit: Payer: Self-pay

## 2019-05-15 ENCOUNTER — Encounter: Payer: Self-pay | Admitting: Family Medicine

## 2019-05-15 ENCOUNTER — Other Ambulatory Visit: Payer: Self-pay | Admitting: Physician Assistant

## 2019-05-15 ENCOUNTER — Ambulatory Visit (INDEPENDENT_AMBULATORY_CARE_PROVIDER_SITE_OTHER): Payer: Medicare Other | Admitting: Family Medicine

## 2019-05-15 ENCOUNTER — Encounter (HOSPITAL_COMMUNITY): Payer: Self-pay | Admitting: Orthopedic Surgery

## 2019-05-15 VITALS — BP 138/70 | HR 60 | Temp 97.9°F | Ht 68.75 in | Wt 242.0 lb

## 2019-05-15 DIAGNOSIS — S62524A Nondisplaced fracture of distal phalanx of right thumb, initial encounter for closed fracture: Secondary | ICD-10-CM | POA: Diagnosis not present

## 2019-05-15 DIAGNOSIS — W19XXXA Unspecified fall, initial encounter: Secondary | ICD-10-CM | POA: Diagnosis not present

## 2019-05-15 DIAGNOSIS — M79644 Pain in right finger(s): Secondary | ICD-10-CM

## 2019-05-15 DIAGNOSIS — S62521A Displaced fracture of distal phalanx of right thumb, initial encounter for closed fracture: Secondary | ICD-10-CM | POA: Diagnosis not present

## 2019-05-15 NOTE — Progress Notes (Addendum)
Paul [name]. [name] denies chest pain or shortness. [name] tested negative for Covid 05/12/2018.  [name] has been in quarantine with his wife. Today [name] had a PCP appointment after the Dr appointment, [name] went into a restaurant and ate inside. [name] said there were only 4 people in the restaurant and that they were there only 20 minutes. I will notify Dr Linna Caprice. Dr Linna Caprice wants [name] to be tested when he arrives in am. I notified Paul Flatten, RN,-C AD,she said it can be time and [name] to arrive at [facility].  [name] is prediabetic, A1C in October 2020 was 5.6.  I instructed [name] that is is to not eat anything after midnight. I informed him that we would like him to drink some clear liquids. [name] said he will drink coffee with artificial  sweeter and water. I encourage [name] to drink a clear juice, apple or grape juice. All fluids should stop at 0540.

## 2019-05-15 NOTE — Progress Notes (Addendum)
Chief Complaint  Patient presents with  . Hand Injury    Right Thumb  . Fall    History of Present Illness: HPI 86 year old male presents following fall ( tripped on boot for toe issue) landed on right hand 2-3 days ago. PAin at base of right first digit.  No proceeding symptoms prior to fal.. just lost balance.  He has foot surgery tommorow  With Dr. Sharol Given.  This visit occurred during the SARS-CoV-2 public health emergency.  Safety protocols were in place, including screening questions prior to the visit, additional usage of staff PPE, and extensive cleaning of exam room while observing appropriate contact time as indicated for disinfecting solutions.   COVID 19 screen:  No recent travel or known exposure to COVID19 The patient denies respiratory symptoms of COVID 19 at this time. The importance of social distancing was discussed today.     Review of Systems  Constitutional: Negative for chills and fever.  HENT: Negative for congestion and ear pain.   Eyes: Negative for pain and redness.  Respiratory: Negative for cough and shortness of breath.   Cardiovascular: Negative for chest pain, palpitations and leg swelling.  Gastrointestinal: Negative for abdominal pain, blood in stool, constipation, diarrhea, nausea and vomiting.  Genitourinary: Negative for dysuria.  Musculoskeletal: Negative for falls and myalgias.  Skin: Negative for rash.  Neurological: Negative for dizziness.  Psychiatric/Behavioral: Negative for depression. The patient is not nervous/anxious.       Past Medical History:  Diagnosis Date  . Carotid stenosis, asymptomatic, bilateral 09/16/2016   Diffuse 1-39% bilaterally, but involving ICA, ECA, CCA - rpt 1 yr (09/2016)  . Diverticulitis   . GERD (gastroesophageal reflux disease)   . Gout   . History of osteomyelitis 10/2015   left foot  . History of squamous cell carcinoma of skin   . HTN (hypertension)   . OSA on CPAP 09/01/2016  . Osteoarthritis    back, neck, knees - multiple joints  . Personal history of malignant neoplasm of larynx 2009   s/p XRT  . Seasonal allergies   . Sensorineural hearing loss (SNHL) of both ears 09/01/2016    reports that he has never smoked. He has never used smokeless tobacco. He reports current alcohol use. He reports that he does not use drugs.   Current Outpatient Medications:  .  allopurinol (ZYLOPRIM) 100 MG tablet, Take 2 tablets (200 mg total) by mouth daily., Disp: 180 tablet, Rfl: 3 .  aspirin EC 81 MG tablet, Take 81 mg by mouth daily., Disp: , Rfl:  .  carboxymethylcellulose (REFRESH TEARS) 0.5 % SOLN, Place 1 drop into both eyes daily as needed (dry eyes)., Disp: , Rfl:  .  cetirizine (ZYRTEC) 10 MG tablet, Take 10 mg by mouth daily. Alternates weekly with allegra., Disp: , Rfl:  .  fexofenadine (ALLEGRA) 180 MG tablet, Take 180 mg by mouth daily. Alternates weekly with zyrtec., Disp: , Rfl:  .  GLUCOSAMINE-CHONDROITIN PO, Take 2 tablets by mouth daily., Disp: , Rfl:  .  hydrochlorothiazide (MICROZIDE) 12.5 MG capsule, TAKE 1 CAPSULE(12.5 MG) BY MOUTH DAILY (Patient taking differently: Take 12.5 mg by mouth daily. ), Disp: 90 capsule, Rfl: 3 .  Lactobacillus (ACIDOPHILUS PO), Take 1 capsule by mouth daily. , Disp: , Rfl:  .  metoprolol succinate (TOPROL-XL) 25 MG 24 hr tablet, TAKE 1 TABLET BY MOUTH DAILY. TAKE WITH OR IMMEDIATELY FOLLOWING A MEAL (Patient taking differently: Take 25 mg by mouth daily. TAKE 1 TABLET BY  MOUTH DAILY. TAKE WITH OR IMMEDIATELY FOLLOWING A MEAL), Disp: 90 tablet, Rfl: 3 .  Milk Thistle 1000 MG CAPS, Take 1,000 mg by mouth daily. , Disp: , Rfl:  .  Multiple Vitamins-Minerals (MULTIVITAMIN ADULT PO), Take 1 tablet by mouth daily., Disp: , Rfl:  .  naproxen sodium (ALEVE) 220 MG tablet, Take 440 mg by mouth daily., Disp: , Rfl:  .  omeprazole (PRILOSEC) 20 MG capsule, Take 20 mg by mouth daily., Disp: , Rfl:  .  polyethylene glycol (MIRALAX / GLYCOLAX) packet, Take 8.5 g by  mouth daily. , Disp: , Rfl:  .  Potassium 99 MG TABS, Take 99 mg by mouth daily., Disp: , Rfl:    Observations/Objective: Blood pressure 138/70, pulse 60, temperature 97.9 F (36.6 C), temperature source Temporal, height 5' 8.75" (1.746 m), weight 242 lb (109.8 kg), SpO2 97 %.  Physical Exam Constitutional:      Appearance: He is well-developed. He is obese.  HENT:     Head: Normocephalic.     Right Ear: Hearing normal.     Left Ear: Hearing normal.     Nose: Nose normal.  Neck:     Thyroid: No thyroid mass or thyromegaly.     Vascular: No carotid bruit.     Trachea: Trachea normal.  Cardiovascular:     Rate and Rhythm: Normal rate and regular rhythm.     Pulses: Normal pulses.     Heart sounds: Heart sounds not distant. No murmur. No friction rub. No gallop.      Comments: No peripheral edema Pulmonary:     Effort: Pulmonary effort is normal. No respiratory distress.     Breath sounds: Normal breath sounds.  Musculoskeletal:     Right wrist: Normal.     Left wrist: Normal.     Comments:  Contusion under right first nail... ttp distally and at base of right thumb, decreased ROM.  Noraml sensation, pulses 2+ radial, warm digit with good blood flow.  Skin:    General: Skin is warm and dry.     Findings: No rash.  Psychiatric:        Speech: Speech normal.        Behavior: Behavior normal.        Thought Content: Thought content normal.      Assessment and Plan  Accidental fall: no proceeding symptoms Tripped on   Cam Walker.    Fracture at tip of 1st of distal phalanx ( comminuted) and possible fracture at base of 1st metacarpal, slightly displaced:  Place thumb spica splint, ice and elevate.  Referral to hand specialist for consideration of repair.  X-rays to be over-read by radiology.  Addendum: Only fracture present is distally in thumb.     Eliezer Lofts, MD

## 2019-05-15 NOTE — Telephone Encounter (Signed)
Dr. Rometta Emery addended note after radiologist review faxed to Dr. Milly Jakob, [facility].

## 2019-05-15 NOTE — Patient Instructions (Signed)
Placed in thumb spica splint.  Ice, elevate.  We will call with hand specialist referral.

## 2019-05-16 ENCOUNTER — Telehealth: Payer: Self-pay | Admitting: Orthopedic Surgery

## 2019-05-16 ENCOUNTER — Other Ambulatory Visit: Payer: Self-pay

## 2019-05-16 ENCOUNTER — Encounter (HOSPITAL_COMMUNITY)
Admission: RE | Disposition: A | Discharge status: Home / Self Care | Payer: Self-pay | Source: Home / Self Care | Attending: Orthopedic Surgery

## 2019-05-16 ENCOUNTER — Ambulatory Visit (HOSPITAL_COMMUNITY): Payer: Medicare Other | Admitting: Anesthesiology

## 2019-05-16 ENCOUNTER — Ambulatory Visit (HOSPITAL_COMMUNITY)
Admission: RE | Admit: 2019-05-16 | Discharge: 2019-05-16 | Disposition: A | Discharge status: Home / Self Care | Payer: Medicare Other | Attending: Orthopedic Surgery | Admitting: Orthopedic Surgery

## 2019-05-16 ENCOUNTER — Encounter (HOSPITAL_COMMUNITY): Payer: Self-pay | Admitting: Orthopedic Surgery

## 2019-05-16 DIAGNOSIS — M109 Gout, unspecified: Secondary | ICD-10-CM | POA: Diagnosis not present

## 2019-05-16 DIAGNOSIS — K219 Gastro-esophageal reflux disease without esophagitis: Secondary | ICD-10-CM | POA: Diagnosis not present

## 2019-05-16 DIAGNOSIS — Z8521 Personal history of malignant neoplasm of larynx: Secondary | ICD-10-CM | POA: Insufficient documentation

## 2019-05-16 DIAGNOSIS — Z7982 Long term (current) use of aspirin: Secondary | ICD-10-CM | POA: Diagnosis not present

## 2019-05-16 DIAGNOSIS — Z9989 Dependence on other enabling machines and devices: Secondary | ICD-10-CM | POA: Diagnosis not present

## 2019-05-16 DIAGNOSIS — M479 Spondylosis, unspecified: Secondary | ICD-10-CM | POA: Diagnosis not present

## 2019-05-16 DIAGNOSIS — G4733 Obstructive sleep apnea (adult) (pediatric): Secondary | ICD-10-CM | POA: Insufficient documentation

## 2019-05-16 DIAGNOSIS — Z20822 Contact with and (suspected) exposure to covid-19: Secondary | ICD-10-CM | POA: Insufficient documentation

## 2019-05-16 DIAGNOSIS — M869 Osteomyelitis, unspecified: Secondary | ICD-10-CM | POA: Diagnosis not present

## 2019-05-16 DIAGNOSIS — Z8249 Family history of ischemic heart disease and other diseases of the circulatory system: Secondary | ICD-10-CM | POA: Insufficient documentation

## 2019-05-16 DIAGNOSIS — Z79899 Other long term (current) drug therapy: Secondary | ICD-10-CM | POA: Diagnosis not present

## 2019-05-16 DIAGNOSIS — Z888 Allergy status to other drugs, medicaments and biological substances status: Secondary | ICD-10-CM | POA: Insufficient documentation

## 2019-05-16 DIAGNOSIS — I10 Essential (primary) hypertension: Secondary | ICD-10-CM | POA: Diagnosis not present

## 2019-05-16 DIAGNOSIS — M86672 Other chronic osteomyelitis, left ankle and foot: Secondary | ICD-10-CM | POA: Diagnosis not present

## 2019-05-16 DIAGNOSIS — R7303 Prediabetes: Secondary | ICD-10-CM | POA: Insufficient documentation

## 2019-05-16 DIAGNOSIS — M170 Bilateral primary osteoarthritis of knee: Secondary | ICD-10-CM | POA: Diagnosis not present

## 2019-05-16 HISTORY — DX: Prediabetes: R73.03

## 2019-05-16 HISTORY — DX: Other complications of anesthesia, initial encounter: T88.59XA

## 2019-05-16 HISTORY — PX: AMPUTATION: SHX166

## 2019-05-16 LAB — BASIC METABOLIC PANEL
Anion gap: 12 (ref 5–15)
BUN: 25 mg/dL — ABNORMAL HIGH (ref 8–23)
CO2: 22 mmol/L (ref 22–32)
Calcium: 8.7 mg/dL — ABNORMAL LOW (ref 8.9–10.3)
Chloride: 103 mmol/L (ref 98–111)
Creatinine, Ser: 1.1 mg/dL (ref 0.61–1.24)
GFR calc Af Amer: >60 mL/min (ref >60)
GFR calc non Af Amer: >60 mL/min (ref >60)
Glucose, Bld: 109 mg/dL — ABNORMAL HIGH (ref 70–99)
Potassium: 4.1 mmol/L (ref 3.5–5.1)
Sodium: 137 mmol/L (ref 135–145)

## 2019-05-16 LAB — CBC
HCT: 40.6 % (ref 39.0–52.0)
Hemoglobin: 13.4 g/dL (ref 13.0–17.0)
MCH: 32.5 pg (ref 26.0–34.0)
MCHC: 33 g/dL (ref 30.0–36.0)
MCV: 98.5 fL (ref 80.0–100.0)
Platelets: 187 10*3/uL (ref 150–400)
RBC: 4.12 MIL/uL — ABNORMAL LOW (ref 4.22–5.81)
RDW: 13.8 % (ref 11.5–15.5)
WBC: 5 10*3/uL (ref 4.0–10.5)
nRBC: 0 % (ref 0.0–0.2)

## 2019-05-16 LAB — RESPIRATORY PANEL BY RT PCR (FLU A&B, COVID)
Influenza A by PCR: NEGATIVE
Influenza B by PCR: NEGATIVE
SARS Coronavirus 2 by RT PCR: NEGATIVE

## 2019-05-16 SURGERY — AMPUTATION DIGIT
Anesthesia: Regional | Laterality: Left

## 2019-05-16 MED ORDER — CEFAZOLIN SODIUM-DEXTROSE 2-4 GM/100ML-% IV SOLN
2.0000 g | INTRAVENOUS | Status: AC
  Administered 2019-05-16: 09:00:00 2 g via INTRAVENOUS

## 2019-05-16 MED ORDER — PROPOFOL 500 MG/50ML IV EMUL
INTRAVENOUS | Status: DC | PRN
  Administered 2019-05-16: 100 ug/kg/min via INTRAVENOUS

## 2019-05-16 MED ORDER — HYDROCODONE-ACETAMINOPHEN 5-325 MG PO TABS: 1.0000 | ORAL_TABLET | ORAL | 0 refills | Status: DC | PRN

## 2019-05-16 MED ORDER — ROPIVACAINE HCL 5 MG/ML IJ SOLN
INTRAMUSCULAR | Status: DC | PRN
  Administered 2019-05-16: 30 mL via EPIDURAL

## 2019-05-16 MED ORDER — MIDAZOLAM HCL 2 MG/2ML IJ SOLN
INTRAMUSCULAR | Status: AC
  Filled 2019-05-16: qty 2

## 2019-05-16 MED ORDER — LIDOCAINE HCL (CARDIAC) PF 100 MG/5ML IV SOSY
PREFILLED_SYRINGE | INTRAVENOUS | Status: DC | PRN
  Administered 2019-05-16: 60 mg via INTRATRACHEAL

## 2019-05-16 MED ORDER — CLONIDINE HCL (ANALGESIA) 100 MCG/ML EP SOLN
EPIDURAL | Status: DC | PRN
  Administered 2019-05-16: 100 ug

## 2019-05-16 MED ORDER — FENTANYL CITRATE (PF) 100 MCG/2ML IJ SOLN
25.0000 ug | Freq: Once | INTRAMUSCULAR | Status: AC
  Filled 2019-05-16: qty 0.5

## 2019-05-16 MED ORDER — CHLORHEXIDINE GLUCONATE 4 % EX LIQD: 60.0000 mL | Freq: Once | CUTANEOUS | Status: DC

## 2019-05-16 MED ORDER — ONDANSETRON HCL 4 MG/2ML IJ SOLN: 4.0000 mg | Freq: Once | INTRAMUSCULAR | Status: DC | PRN

## 2019-05-16 MED ORDER — 0.9 % SODIUM CHLORIDE (POUR BTL) OPTIME
TOPICAL | Status: DC | PRN
  Administered 2019-05-16: 09:00:00 1000 mL

## 2019-05-16 MED ORDER — FENTANYL CITRATE (PF) 100 MCG/2ML IJ SOLN: 25.0000 ug | INTRAMUSCULAR | Status: DC | PRN

## 2019-05-16 MED ORDER — FENTANYL CITRATE (PF) 100 MCG/2ML IJ SOLN
INTRAMUSCULAR | Status: AC
  Administered 2019-05-16: 08:00:00 25 ug via INTRAVENOUS
  Filled 2019-05-16: qty 2

## 2019-05-16 MED ORDER — ACETAMINOPHEN 10 MG/ML IV SOLN: 1000.0000 mg | Freq: Once | INTRAVENOUS | Status: DC | PRN

## 2019-05-16 MED ORDER — LIDOCAINE 2% (20 MG/ML) 5 ML SYRINGE
INTRAMUSCULAR | Status: AC
  Filled 2019-05-16: qty 5

## 2019-05-16 MED ORDER — CEFAZOLIN SODIUM-DEXTROSE 2-4 GM/100ML-% IV SOLN
INTRAVENOUS | Status: AC
  Filled 2019-05-16: qty 100

## 2019-05-16 MED ORDER — LACTATED RINGERS IV SOLN: INTRAVENOUS | Status: DC

## 2019-05-16 SURGICAL SUPPLY — 30 items
BLADE SURG 21 STRL SS (BLADE) ×3 IMPLANT
BNDG COHESIVE 4X5 TAN STRL (GAUZE/BANDAGES/DRESSINGS) IMPLANT
BNDG ELASTIC 4X5.8 VLCR STR LF (GAUZE/BANDAGES/DRESSINGS) ×3 IMPLANT
BNDG ESMARK 4X9 LF (GAUZE/BANDAGES/DRESSINGS) IMPLANT
BNDG GAUZE ELAST 4 BULKY (GAUZE/BANDAGES/DRESSINGS) ×3 IMPLANT
COVER SURGICAL LIGHT HANDLE (MISCELLANEOUS) ×3 IMPLANT
DRAPE U-SHAPE 47X51 STRL (DRAPES) ×3 IMPLANT
DRSG ADAPTIC 3X8 NADH LF (GAUZE/BANDAGES/DRESSINGS) ×3 IMPLANT
DRSG PAD ABDOMINAL 8X10 ST (GAUZE/BANDAGES/DRESSINGS) IMPLANT
DURAPREP 26ML APPLICATOR (WOUND CARE) ×3 IMPLANT
ELECT REM PT RETURN 9FT ADLT (ELECTROSURGICAL) ×3
ELECTRODE REM PT RTRN 9FT ADLT (ELECTROSURGICAL) ×1 IMPLANT
GAUZE SPONGE 4X4 12PLY STRL (GAUZE/BANDAGES/DRESSINGS) IMPLANT
GAUZE SPONGE 4X4 12PLY STRL LF (GAUZE/BANDAGES/DRESSINGS) ×3 IMPLANT
GLOVE BIOGEL PI IND STRL 9 (GLOVE) ×1 IMPLANT
GLOVE BIOGEL PI INDICATOR 9 (GLOVE) ×2
GLOVE SURG ORTHO 9.0 STRL STRW (GLOVE) ×3 IMPLANT
GOWN STRL REUS W/ TWL XL LVL3 (GOWN DISPOSABLE) ×2 IMPLANT
GOWN STRL REUS W/TWL XL LVL3 (GOWN DISPOSABLE) ×4
KIT BASIN OR (CUSTOM PROCEDURE TRAY) ×3 IMPLANT
KIT TURNOVER KIT B (KITS) ×3 IMPLANT
MANIFOLD NEPTUNE II (INSTRUMENTS) ×3 IMPLANT
NEEDLE 22X1 1/2 (OR ONLY) (NEEDLE) IMPLANT
NS IRRIG 1000ML POUR BTL (IV SOLUTION) ×3 IMPLANT
PACK ORTHO EXTREMITY (CUSTOM PROCEDURE TRAY) ×3 IMPLANT
PAD ABD 8X10 STRL (GAUZE/BANDAGES/DRESSINGS) ×3 IMPLANT
PAD ARMBOARD 7.5X6 YLW CONV (MISCELLANEOUS) ×3 IMPLANT
SUT ETHILON 2 0 PSLX (SUTURE) ×3 IMPLANT
SYR CONTROL 10ML LL (SYRINGE) IMPLANT
TOWEL GREEN STERILE (TOWEL DISPOSABLE) ×3 IMPLANT

## 2019-05-16 NOTE — Anesthesia Postprocedure Evaluation (Signed)
Anesthesia Post Note  Patient: [name]  Procedure(s) Performed: LEFT GREAT TOE AMPUTATION (Left )     Patient location during evaluation: PACU Anesthesia Type: Regional Level of consciousness: awake and alert Pain management: pain level controlled Vital Signs Assessment: post-procedure vital signs reviewed and stable Respiratory status: spontaneous breathing, nonlabored ventilation, respiratory function stable and patient connected to nasal cannula oxygen Cardiovascular status: stable and blood pressure returned to baseline Postop Assessment: no apparent nausea or vomiting Anesthetic complications: no    Last Vitals:  Vitals:   05/16/19 0918 05/16/19 0930  BP: 135/63 (!) 159/72  Pulse: (!) 55   Resp: 16   Temp:  36.4 C  SpO2: 95% 100%    Last Pain:  Vitals:   05/16/19 0903  TempSrc:   PainSc: 0-No pain                 Barnet Glasgow

## 2019-05-16 NOTE — Discharge Planning (Signed)
Fuller Mandril, RN, BSN, [place] [phone] Pt qualifies for DME rolling walker.  DME  ordered through [employer].  Zack Blank of rolling walker notified to deliver rolling walker to pt room prior to D/C home.

## 2019-05-16 NOTE — Telephone Encounter (Signed)
Patient called. Her husband had surgery this morning and she needs instruction on after care.  Call back number: [phone]

## 2019-05-16 NOTE — Evaluation (Signed)
Physical Therapy Evaluation Patient Details Name: [name] MRN: [id number] DOB: 1931-05-24 Today's Date: 05/16/2019   History of Present Illness  Patient is an 86 y/o male admitted with osteomyelitis of L great toe now s/p amputation with PMH of HTN, OSA on CPAP, GERD, carotid stenosis, gout and osteomyelitis.  Clinical Impression  Patient stable for home after adjusting walker and educated on gait sequence with TDWB on L with post-op shoe.  Able to return home safely with wife assist. No follow up PT needed.     Follow Up Recommendations No PT follow up    Equipment Recommendations  None recommended by PT(had a walker with him at d/c)    Recommendations for Other Services       Precautions / Restrictions Precautions Precautions: Other (comment) Precaution Comments: post-op shoe Restrictions Weight Bearing Restrictions: Yes LLE Weight Bearing: Touchdown weight bearing      Mobility  Bed Mobility               General bed mobility comments: in recliner  Transfers Overall transfer level: Needs assistance Equipment used: Rolling walker (2 wheeled) Transfers: Sit to/from Stand Sit to Stand: Supervision         General transfer comment: cues for technique, adjusted walker for height  Ambulation/Gait Ambulation/Gait assistance: Supervision Gait Distance (Feet): 80 Feet Assistive device: Rolling walker (2 wheeled) Gait Pattern/deviations: Step-to pattern;Decreased stance time - left;Decreased step length - right     General Gait Details: cues and demonstration for technique with RW TDWB on L demo with heel weight only, but pt not tolerating well so encouraged foot flat and all the weight through hands. Demonstrated and pt performed with S  Stairs            Wheelchair Mobility    Modified Rankin (Stroke Patients Only)       Balance Overall balance assessment: No apparent balance deficits (not formally assessed)                                            Pertinent Vitals/Pain Pain Assessment: Faces Pain Location: L knee with heel weight bearing Pain Descriptors / Indicators: Aching;Sore Pain Intervention(s): Monitored during session;Repositioned    Home Living Family/patient expects to be discharged to:: Private residence   Available Help at Discharge: Family Type of Home: House Home Access: Level entry     Home Layout: One level Home Equipment: Environmental consultant - 2 wheels      Prior Function Level of Independence: Independent               Hand Dominance        Extremity/Trunk Assessment   Upper Extremity Assessment Upper Extremity Assessment: Overall WFL for tasks assessed    Lower Extremity Assessment Lower Extremity Assessment: LLE deficits/detail LLE Deficits / Details: in post op shoe with bandaging around foot/toe area, moves WFL except pain in knee with heel weight bearing       Communication      Cognition Arousal/Alertness: Awake/alert Behavior During Therapy: WFL for tasks assessed/performed Overall Cognitive Status: Within Functional Limits for tasks assessed                                        General Comments General comments (skin integrity, edema, etc.): Encourgaed  to always have shoe on R foot to help with weight bearing and for balance.    Exercises     Assessment/Plan    PT Assessment Patent does not need any further PT services  PT Problem List         PT Treatment Interventions      PT Goals (Current goals can be found in the Care Plan section)  Acute Rehab PT Goals PT Goal Formulation: All assessment and education complete, DC therapy    Frequency     Barriers to discharge        Co-evaluation               AM-PAC PT "6 Clicks" Mobility  Outcome Measure Help needed turning from your back to your side while in a flat bed without using bedrails?: None Help needed moving from lying on your back to sitting on the side of a  flat bed without using bedrails?: None Help needed moving to and from a bed to a chair (including a wheelchair)?: None Help needed standing up from a chair using your arms (e.g., wheelchair or bedside chair)?: None Help needed to walk in hospital room?: None Help needed climbing 3-5 steps with a railing? : A Little 6 Click Score: 23    End of Session   Activity Tolerance: Patient tolerated treatment well Patient left: in chair(ready for d/c)   PT Visit Diagnosis: Difficulty in walking, not elsewhere classified (R26.2)    Time: [id number] PT Time Calculation (min) (ACUTE ONLY): 13 min   Charges:   PT Evaluation $PT Eval Low Complexity: [place], [place] [facility] [phone] 05/16/2019   Reginia Naas 05/16/2019, 12:33 PM

## 2019-05-16 NOTE — Anesthesia Procedure Notes (Signed)
Anesthesia Regional Block: Popliteal block   Pre-Anesthetic Checklist: ,, timeout performed, Correct Patient, Correct Site, Correct Laterality, Correct Procedure, Correct Position, site marked, Risks and benefits discussed, pre-op evaluation,  At surgeon's request and post-op pain management  Laterality: Left  Prep: Maximum Sterile Barrier Precautions used, chloraprep       Needles:  Injection technique: Single-shot  Needle Type: Echogenic Needle     Needle Length: 9cm  Needle Gauge: 21     Additional Needles:   Procedures:,,,, ultrasound used (permanent image in chart),,,,  Narrative:  Start time: 05/16/2019 8:11 AM End time: 05/16/2019 8:17 AM Injection made incrementally with aspirations every 5 mL.  Performed by: Personally  Anesthesiologist: Barnet Glasgow, MD  Additional Notes: Block assessed. Patient tolerated procedure well.

## 2019-05-16 NOTE — Anesthesia Procedure Notes (Signed)
Procedure Name: MAC Date/Time: 05/16/2019 8:50 AM Performed by: Kathryne Hitch, CRNA Pre-anesthesia Checklist: Patient identified, Emergency Drugs available, Suction available and Patient being monitored Patient Re-evaluated:Patient Re-evaluated prior to induction Oxygen Delivery Method: Simple face mask Preoxygenation: Pre-oxygenation with 100% oxygen Induction Type: IV induction

## 2019-05-16 NOTE — Anesthesia Preprocedure Evaluation (Addendum)
Anesthesia Evaluation  Patient identified by MRN, date of birth, ID band Patient awake    Reviewed: Allergy & Precautions, NPO status , Patient's Chart, lab work & pertinent test results  Airway Mallampati: II  TM Distance: >3 FB Neck ROM: Full    Dental no notable dental hx. (+) Teeth Intact, Implants   Pulmonary sleep apnea and Continuous Positive Airway Pressure Ventilation ,    Pulmonary exam normal breath sounds clear to auscultation       Cardiovascular Exercise Tolerance: Good hypertension, Pt. on medications Normal cardiovascular exam Rhythm:Regular Rate:Normal     Neuro/Psych negative neurological ROS  negative psych ROS   GI/Hepatic Neg liver ROS, GERD  ,  Endo/Other  negative endocrine ROS  Renal/GU negative Renal ROS     Musculoskeletal negative musculoskeletal ROS (+)   Abdominal   Peds  Hematology negative hematology ROS (+)   Anesthesia Other Findings   Reproductive/Obstetrics                            Anesthesia Physical Anesthesia Plan  ASA: III  Anesthesia Plan: Regional   Post-op Pain Management:    Induction: Intravenous  PONV Risk Score and Plan: 2 and Treatment may vary due to age or medical condition, Ondansetron and Dexamethasone  Airway Management Planned: Nasal Cannula and Natural Airway  Additional Equipment: None  Intra-op Plan:   Post-operative Plan:   Informed Consent: I have reviewed the patients History and Physical, chart, labs and discussed the procedure including the risks, benefits and alternatives for the proposed anesthesia with the patient or authorized representative who has indicated his/her understanding and acceptance.     Dental advisory given  Plan Discussed with: CRNA  Anesthesia Plan Comments: (l popliteal)        Anesthesia Quick Evaluation

## 2019-05-16 NOTE — Transfer of Care (Signed)
Immediate Anesthesia Transfer of Care Note  Patient: [name]  Procedure(s) Performed: LEFT GREAT TOE AMPUTATION (Left )  Patient Location: PACU  Anesthesia Type:MAC and Regional  Level of Consciousness: awake and patient cooperative  Airway & Oxygen Therapy: Patient Spontanous Breathing  Post-op Assessment: Report given to RN and Post -op Vital signs reviewed and stable  Post vital signs: Reviewed and stable  Last Vitals:  Vitals Value Taken Time  BP 96/79 05/16/19 0904  Temp 36.4 C 05/16/19 0903  Pulse 46 05/16/19 0905  Resp 20 05/16/19 0905  SpO2 95 % 05/16/19 0905  Vitals shown include unvalidated device data.  Last Pain:  Vitals:   05/16/19 0654  TempSrc:   PainSc: 0-No pain         Complications: No apparent anesthesia complications

## 2019-05-16 NOTE — Op Note (Signed)
05/16/2019  9:02 AM  PATIENT:  [name]    PRE-OPERATIVE DIAGNOSIS:  Osteomyelitis Left Great Toe  POST-OPERATIVE DIAGNOSIS:  Same  PROCEDURE:  LEFT GREAT TOE AMPUTATION  SURGEON:  Newt Minion, MD  PHYSICIAN ASSISTANT:None ANESTHESIA:   General  PREOPERATIVE INDICATIONS:  [name] is a  86 y.o. male with a diagnosis of Osteomyelitis Left Great Toe who failed conservative measures and elected for surgical management.    The risks benefits and alternatives were discussed with the patient preoperatively including but not limited to the risks of infection, bleeding, nerve injury, cardiopulmonary complications, the need for revision surgery, among others, and the patient was willing to proceed.  OPERATIVE IMPLANTS: None  @ENCIMAGES @  OPERATIVE FINDINGS: No abscess at the MTP joint  OPERATIVE PROCEDURE: Patient was brought the operating room after undergoing a regional block of the left lower extremity was then prepped using DuraPrep draped into a sterile field a timeout was called.  A fishmouth incision was made just distal to the MTP joint.  The toe was amputated through the MTP joint electrocautery was used for hemostasis wound was irrigated with normal saline incision was closed using 2-0 nylon a sterile dressing was applied patient was taken the PACU in stable condition.   DISCHARGE PLANNING:  Antibiotic duration: Preoperative antibiotics only  Weightbearing: Touchdown weightbearing on the left  Pain medication: Prescription for Vicodin  Dressing care/ Wound VAC: Change dressing at follow-up in the office  Ambulatory devices: Walker or crutches  Discharge to: Home.  Follow-up: In the office 1 week post operative.

## 2019-05-16 NOTE — H&P (Signed)
[name] is an 86 y.o. male.   Chief Complaint: Left great toe osteomyelitis  HPI: Patient is an 86 year old gentleman who is seen in referral from Dr. Dellia Nims at the wound center.  Patient states he got an argument with Dr. Dellia Nims stating that Dr. Dellia Nims did not want him to be referred here.  Patient states that he started treatment for the osteomyelitis of the left great toe when he was in [place] about 3 years ago patient states he was treated with 6 weeks of IV antibiotics through a PICC line.  Patient is currently been treated with Levaquin and just finished it prior to the office visit.  He has been undergoing dressing changes with silver alginate dressing a felt pad.  Past Medical History:  Diagnosis Date  . Carotid stenosis, asymptomatic, bilateral 09/16/2016   Diffuse 1-39% bilaterally, but involving ICA, ECA, CCA - rpt 1 yr (09/2016)  . Complication of anesthesia    slow to wake up after a colonoscopy years ago  . Diverticulitis   . GERD (gastroesophageal reflux disease)   . Gout   . History of osteomyelitis 10/2015   left foot  . History of squamous cell carcinoma of skin   . HTN (hypertension)   . OSA on CPAP 09/01/2016  . Osteoarthritis    back, neck, knees - multiple joints  . Personal history of malignant neoplasm of larynx 2009   s/p XRT, Skin Cancer- "pre cancerous"  . Pre-diabetes   . Seasonal allergies   . Sensorineural hearing loss (SNHL) of both ears 09/01/2016    Past Surgical History:  Procedure Laterality Date  . ACHILLES TENDON REPAIR Left   . APPENDECTOMY    . COLONOSCOPY    . THROAT SURGERY N/A 2009   ?vocal cord vs larynx nodule removal, had XRT after this but states it was not cancer  . TONSILLECTOMY    . TREATMENT FISTULA ANAL      Family History  Problem Relation Age of Onset  . Hypertension Mother   . Hypertension Father   . Cancer Other        unknown - niece   Social History:  reports that he has never smoked. He has never used  smokeless tobacco. He reports current alcohol use of about 4.0 standard drinks of alcohol per week. He reports that he does not use drugs.  Allergies:  Allergies  Allergen Reactions  . Lisinopril Cough    Medications Prior to Admission  Medication Sig Dispense Refill  . allopurinol (ZYLOPRIM) 100 MG tablet Take 2 tablets (200 mg total) by mouth daily. 180 tablet 3  . aspirin EC 81 MG tablet Take 81 mg by mouth daily.    . carboxymethylcellulose (REFRESH TEARS) 0.5 % SOLN Place 1 drop into both eyes daily as needed (dry eyes).    . cetirizine (ZYRTEC) 10 MG tablet Take 10 mg by mouth daily. Alternates weekly with allegra.    . fexofenadine (ALLEGRA) 180 MG tablet Take 180 mg by mouth daily. Alternates weekly with zyrtec.    Marland Kitchen GLUCOSAMINE-CHONDROITIN PO Take 2 tablets by mouth daily.    . hydrochlorothiazide (MICROZIDE) 12.5 MG capsule TAKE 1 CAPSULE(12.5 MG) BY MOUTH DAILY (Patient taking differently: Take 12.5 mg by mouth daily. ) 90 capsule 3  . Lactobacillus (ACIDOPHILUS PO) Take 1 capsule by mouth daily.     . metoprolol succinate (TOPROL-XL) 25 MG 24 hr tablet TAKE 1 TABLET BY MOUTH DAILY. TAKE WITH OR IMMEDIATELY FOLLOWING A MEAL (Patient taking  differently: Take 25 mg by mouth daily. TAKE 1 TABLET BY MOUTH DAILY. TAKE WITH OR IMMEDIATELY FOLLOWING A MEAL) 90 tablet 3  . Milk Thistle 1000 MG CAPS Take 1,000 mg by mouth daily.     . Multiple Vitamins-Minerals (MULTIVITAMIN ADULT PO) Take 1 tablet by mouth daily.    . naproxen sodium (ALEVE) 220 MG tablet Take 440 mg by mouth daily.    Marland Kitchen omeprazole (PRILOSEC) 20 MG capsule Take 20 mg by mouth daily.    . polyethylene glycol (MIRALAX / GLYCOLAX) packet Take 8.5 g by mouth daily.     . Potassium 99 MG TABS Take 99 mg by mouth daily.      No results found for this or any previous visit (from the past 48 hour(s)). DG Hand Complete Right  Result Date: 05/15/2019 CLINICAL DATA:  Right thumb pain after fall. EXAM: RIGHT HAND - COMPLETE 3+  VIEW COMPARISON:  No prior. FINDINGS: Acute comminuted nondisplaced fracture of the distal aspect of the distal phalanx of the right thumb noted. Diffuse osteopenia and degenerative change. Corticated bony density noted along the posterior wrist most likely related to old fracture fragment. Peripheral vascular calcification. IMPRESSION: Acute comminuted nondisplaced fracture of the distal aspect of the distal phalanx of the right thumb. Electronically Signed   By: Marcello Moores  Register   On: 05/15/2019 13:38    Review of Systems  All other systems reviewed and are negative.   Blood pressure (!) 171/65, pulse (!) 55, temperature 98.2 F (36.8 C), temperature source Oral, resp. rate 17, height 5\' 8"  (1.727 m), weight 105.2 kg, SpO2 95 %. Physical Exam  Patient is alert, oriented, no adenopathy, well-dressed, normal affect, normal respiratory effort. Examination patient has a strong dorsalis pedis pulse he does have venous stasis swelling but no ulcers.  Patient states that the ulcer plantar aspect of the great toe is been there for years.  Patient does have swelling redness and ulceration.  Review of his MRI scan obtained on December 20 [facility] shows enhancement of the tuft of the left great toe consistent with osteomyelitis Assessment/Plan 1. Osteomyelitis of great toe of left foot ([place])     Plan: With the chronic osteomyelitis of the great toe failure of conservative wound care and IV antibiotics patient's best option is to proceed with a amputation of the left great toe through the MTP joint.  Risks and benefits were discussed including risk of the wound not healing need for additional surgery.  Patient states he understands and wished to proceed at this time we will set up surgery as an outpatient.   Bevely Palmer Mercades Bajaj, PA 05/16/2019, 7:12 AM

## 2019-05-19 ENCOUNTER — Ambulatory Visit (INDEPENDENT_AMBULATORY_CARE_PROVIDER_SITE_OTHER): Payer: Medicare Other | Admitting: Orthopedic Surgery

## 2019-05-19 ENCOUNTER — Other Ambulatory Visit: Payer: Self-pay

## 2019-05-19 ENCOUNTER — Encounter: Payer: Self-pay | Admitting: Orthopedic Surgery

## 2019-05-19 VITALS — Ht 68.0 in | Wt 232.0 lb

## 2019-05-19 DIAGNOSIS — M869 Osteomyelitis, unspecified: Secondary | ICD-10-CM

## 2019-05-19 NOTE — Telephone Encounter (Signed)
Pt came in for an office visit today. Doing well no concerns.

## 2019-05-19 NOTE — Progress Notes (Signed)
Office Visit Note   Patient: [name]           Date of Birth: August 17, 1931           MRN: [id number] Visit Date: 05/19/2019              Requested by: Ria Bush, MD [address] [place],  [place] [address] PCP: Ria Bush, MD  Chief Complaint  Patient presents with  . Left Foot - Routine Post Op    05/16/2019 Left Foot GT amp      HPI: This is a pleasant gentleman who is 3 days status post left great toe amputation.  He comes in today because he was having some bleeding through the dressing.  Otherwise he has been doing well he has been trying to place only a small amount of weight on his heel  Assessment & Plan: Visit Diagnoses: No diagnosis found.  Plan: New dressing was placed he will follow-up on Thursday.  At that point I think he can begin showering  Follow-Up Instructions: No follow-ups on file.   Ortho Exam  Patient is alert, oriented, no adenopathy, well-dressed, normal affect, normal respiratory effort. Focused examination of his left foot demonstrates healing surgical incision with healthy wound edges he just has a very small area of dehiscence less than a millimeter on the lateral side of the wound.  Some skin maceration around the second toe.  Moderate amount of soft tissue swelling no cellulitis no foul odor  Imaging: No results found. No images are attached to the encounter.  Labs: Lab Results  Component Value Date   HGBA1C 5.6 02/12/2019   HGBA1C 5.9 03/12/2015   LABURIC 4.2 02/05/2019   LABURIC 4.6 08/27/2017   LABURIC 4.4 08/23/2016   REPTSTATUS 04/20/2019 FINAL 04/16/2019   GRAMSTAIN  04/16/2019    NO WBC SEEN NO ORGANISMS SEEN Performed at [facility], [place] [address] [place], [place] [address]    CULT RARE STAPHYLOCOCCUS LUGDUNENSIS 04/16/2019   LABORGA STAPHYLOCOCCUS LUGDUNENSIS 04/16/2019     Lab Results  Component Value Date   ALBUMIN 4.0 02/05/2019   ALBUMIN 4.2 08/27/2017   ALBUMIN 4.1 08/23/2016    LABURIC 4.2 02/05/2019   LABURIC 4.6 08/27/2017   LABURIC 4.4 08/23/2016    No results found for: MG Lab Results  Component Value Date   VD25OH 39.4 03/12/2015    No results found for: PREALBUMIN CBC EXTENDED Latest Ref Rng & Units 05/16/2019 03/12/2015  WBC 4.0 - 10.5 K/uL 5.0 4.7  RBC 4.22 - 5.81 MIL/uL 4.12(L) -  HGB 13.0 - 17.0 g/dL 13.4 13.2(A)  HCT 39.0 - 52.0 % 40.6 -  PLT 150 - 400 K/uL 187 224     Body mass index is 35.28 kg/m.  Orders:  No orders of the defined types were placed in this encounter.  No orders of the defined types were placed in this encounter.    Procedures: No procedures performed  Clinical Data: No additional findings.  ROS:  All other systems negative, except as noted in the HPI. Review of Systems  Objective: Vital Signs: Ht 5\' 8"  (1.727 m)   Wt 232 lb (105.2 kg)   BMI 35.28 kg/m   Specialty Comments:  No specialty comments available.  PMFS History: Patient Active Problem List   Diagnosis Date Noted  . Osteomyelitis of great toe of left foot ([place])   . Medicare annual wellness visit, subsequent 02/12/2019  . Hyperglycemia 02/12/2019  . Pre-ulcerative  calluses 12/02/2018  . Infection of left great toe due to methicillin resistant Staphylococcus aureus (MRSA) 12/02/2018  . Nasal sinus congestion 11/14/2017  . Advanced care planning/counseling discussion 09/04/2017  . Dyslipidemia 09/04/2017  . Leg cramps 03/07/2017  . Carotid stenosis, asymptomatic, bilateral 09/16/2016  . Transaminitis 09/01/2016  . OSA on CPAP 09/01/2016  . Sensorineural hearing loss (SNHL) of both ears 09/01/2016  . Obesity, Class I, BMI 30.0-34.9 (see actual BMI) 09/01/2016  . Open toe wound, sequela 08/31/2016  . GERD (gastroesophageal reflux disease)   . Gout   . HTN (hypertension)   . Osteoarthritis   . History of osteomyelitis 10/07/2015  . Personal history of malignant neoplasm of larynx 05/09/2007   Past Medical History:  Diagnosis Date    . Carotid stenosis, asymptomatic, bilateral 09/16/2016   Diffuse 1-39% bilaterally, but involving ICA, ECA, CCA - rpt 1 yr (09/2016)  . Complication of anesthesia    slow to wake up after a colonoscopy years ago  . Diverticulitis   . GERD (gastroesophageal reflux disease)   . Gout   . History of osteomyelitis 10/2015   left foot  . History of squamous cell carcinoma of skin   . HTN (hypertension)   . OSA on CPAP 09/01/2016  . Osteoarthritis    back, neck, knees - multiple joints  . Personal history of malignant neoplasm of larynx 2009   s/p XRT, Skin Cancer- "pre cancerous"  . Pre-diabetes   . Seasonal allergies   . Sensorineural hearing loss (SNHL) of both ears 09/01/2016    Family History  Problem Relation Age of Onset  . Hypertension Mother   . Hypertension Father   . Cancer Other        unknown - niece    Past Surgical History:  Procedure Laterality Date  . ACHILLES TENDON REPAIR Left   . AMPUTATION Left 05/16/2019   Procedure: LEFT GREAT TOE AMPUTATION;  Surgeon: Newt Minion, MD;  Location: [place];  Service: Orthopedics;  Laterality: Left;  . APPENDECTOMY    . COLONOSCOPY    . THROAT SURGERY N/A 2009   ?vocal cord vs larynx nodule removal, had XRT after this but states it was not cancer  . TONSILLECTOMY    . TREATMENT FISTULA ANAL     Social History   Occupational History  . Not on file  Tobacco Use  . Smoking status: Never Smoker  . Smokeless tobacco: Never Used  Substance and Sexual Activity  . Alcohol use: Yes    Alcohol/week: 4.0 standard drinks    Types: 4 Glasses of wine per week  . Drug use: No  . Sexual activity: Not on file

## 2019-05-22 ENCOUNTER — Other Ambulatory Visit: Payer: Self-pay

## 2019-05-22 ENCOUNTER — Encounter: Payer: Self-pay | Admitting: Orthopedic Surgery

## 2019-05-22 ENCOUNTER — Ambulatory Visit (INDEPENDENT_AMBULATORY_CARE_PROVIDER_SITE_OTHER): Payer: Medicare Other | Admitting: Orthopedic Surgery

## 2019-05-22 VITALS — Ht 68.0 in | Wt 232.0 lb

## 2019-05-22 DIAGNOSIS — M869 Osteomyelitis, unspecified: Secondary | ICD-10-CM

## 2019-05-22 NOTE — Progress Notes (Signed)
Office Visit Note   Patient: [name]           Date of Birth: [birth year]           MRN: [id number] Visit Date: 05/22/2019              Requested by: Ria Bush, MD [address] [place],  [place] [address] PCP: Ria Bush, MD  Chief Complaint  Patient presents with  . Left Foot - Routine Post Op    05/16/2019 Left Foot GT amp      HPI: This is a pleasant gentleman who is 1 week status post left great toe amputation he is doing well.  He has been compliant with his postoperative care though his wife thinks he has been on his foot more than he should  Assessment & Plan: Visit Diagnoses: No diagnosis found.  Plan: I emphasized the importance of elevating.  He may wash the incision with mild soap and water continue not to place weight on the foot is much as possible follow-up in 1 week  Follow-Up Instructions: No follow-ups on file.   Ortho Exam  Patient is alert, oriented, no adenopathy, well-dressed, normal affect, normal respiratory effort. Focused examination of his left toe demonstrates healing surgical incision moderate soft tissue swelling he does have some erythema surrounding the sutures but this does not look infected more inflammatory and gets better with elevation.  Healthy wound edges are noted  Imaging: No results found. No images are attached to the encounter.  Labs: Lab Results  Component Value Date   HGBA1C 5.6 02/12/2019   HGBA1C 5.9 03/12/2015   LABURIC 4.2 02/05/2019   LABURIC 4.6 08/27/2017   LABURIC 4.4 08/23/2016   REPTSTATUS 04/20/2019 FINAL 04/16/2019   GRAMSTAIN  04/16/2019    NO WBC SEEN NO ORGANISMS SEEN Performed at [facility], [place] [address] [place], [place] [address]    CULT RARE STAPHYLOCOCCUS LUGDUNENSIS 04/16/2019   LABORGA STAPHYLOCOCCUS LUGDUNENSIS 04/16/2019     Lab Results  Component Value Date   ALBUMIN 4.0 02/05/2019   ALBUMIN 4.2 08/27/2017   ALBUMIN 4.1 08/23/2016   LABURIC 4.2  02/05/2019   LABURIC 4.6 08/27/2017   LABURIC 4.4 08/23/2016    No results found for: MG Lab Results  Component Value Date   VD25OH 39.4 03/12/2015    No results found for: PREALBUMIN CBC EXTENDED Latest Ref Rng & Units 05/16/2019 03/12/2015  WBC 4.0 - 10.5 K/uL 5.0 4.7  RBC 4.22 - 5.81 MIL/uL 4.12(L) -  HGB 13.0 - 17.0 g/dL 13.4 13.2(A)  HCT 39.0 - 52.0 % 40.6 -  PLT 150 - 400 K/uL 187 224     Body mass index is 35.28 kg/m.  Orders:  No orders of the defined types were placed in this encounter.  No orders of the defined types were placed in this encounter.    Procedures: No procedures performed  Clinical Data: No additional findings.  ROS:  All other systems negative, except as noted in the HPI. Review of Systems  Objective: Vital Signs: Ht 5\' 8"  (1.727 m)   Wt 232 lb (105.2 kg)   BMI 35.28 kg/m   Specialty Comments:  No specialty comments available.  PMFS History: Patient Active Problem List   Diagnosis Date Noted  . Osteomyelitis of great toe of left foot ([facility])   . Medicare annual wellness visit, subsequent 02/12/2019  . Hyperglycemia 02/12/2019  . Pre-ulcerative calluses 12/02/2018  . Infection of left great  toe due to methicillin resistant Staphylococcus aureus (MRSA) 12/02/2018  . Nasal sinus congestion 11/14/2017  . Advanced care planning/counseling discussion 09/04/2017  . Dyslipidemia 09/04/2017  . Leg cramps 03/07/2017  . Carotid stenosis, asymptomatic, bilateral 09/16/2016  . Transaminitis 09/01/2016  . OSA on CPAP 09/01/2016  . Sensorineural hearing loss (SNHL) of both ears 09/01/2016  . Obesity, Class I, BMI 30.0-34.9 (see actual BMI) 09/01/2016  . Open toe wound, sequela 08/31/2016  . GERD (gastroesophageal reflux disease)   . Gout   . HTN (hypertension)   . Osteoarthritis   . History of osteomyelitis 10/07/2015  . Personal history of malignant neoplasm of larynx 05/09/2007   Past Medical History:  Diagnosis Date  . Carotid  stenosis, asymptomatic, bilateral 09/16/2016   Diffuse 1-39% bilaterally, but involving ICA, ECA, CCA - rpt 1 yr (09/2016)  . Complication of anesthesia    slow to wake up after a colonoscopy years ago  . Diverticulitis   . GERD (gastroesophageal reflux disease)   . Gout   . History of osteomyelitis 10/2015   left foot  . History of squamous cell carcinoma of skin   . HTN (hypertension)   . OSA on CPAP 09/01/2016  . Osteoarthritis    back, neck, knees - multiple joints  . Personal history of malignant neoplasm of larynx 2009   s/p XRT, Skin Cancer- "pre cancerous"  . Pre-diabetes   . Seasonal allergies   . Sensorineural hearing loss (SNHL) of both ears 09/01/2016    Family History  Problem Relation Age of Onset  . Hypertension Mother   . Hypertension Father   . Cancer Other        unknown - niece    Past Surgical History:  Procedure Laterality Date  . ACHILLES TENDON REPAIR Left   . AMPUTATION Left 05/16/2019   Procedure: LEFT GREAT TOE AMPUTATION;  Surgeon: Newt Minion, MD;  Location: [facility];  Service: Orthopedics;  Laterality: Left;  . APPENDECTOMY    . COLONOSCOPY    . THROAT SURGERY N/A 2009   ?vocal cord vs larynx nodule removal, had XRT after this but states it was not cancer  . TONSILLECTOMY    . TREATMENT FISTULA ANAL     Social History   Occupational History  . Not on file  Tobacco Use  . Smoking status: Never Smoker  . Smokeless tobacco: Never Used  Substance and Sexual Activity  . Alcohol use: Yes    Alcohol/week: 4.0 standard drinks    Types: 4 Glasses of wine per week  . Drug use: No  . Sexual activity: Not on file

## 2019-05-26 ENCOUNTER — Encounter: Payer: Self-pay | Admitting: *Deleted

## 2019-05-29 ENCOUNTER — Encounter: Payer: Self-pay | Admitting: Orthopedic Surgery

## 2019-05-29 ENCOUNTER — Other Ambulatory Visit: Payer: Self-pay

## 2019-05-29 ENCOUNTER — Ambulatory Visit (INDEPENDENT_AMBULATORY_CARE_PROVIDER_SITE_OTHER): Payer: Medicare Other | Admitting: Orthopedic Surgery

## 2019-05-29 VITALS — Ht 68.0 in | Wt 232.0 lb

## 2019-05-29 DIAGNOSIS — M869 Osteomyelitis, unspecified: Secondary | ICD-10-CM

## 2019-05-29 NOTE — Progress Notes (Signed)
Office Visit Note   Patient: [name]           Date of Birth: Jul 31, 1931           MRN: [id number] Visit Date: 05/29/2019              Requested by: Ria Bush, MD [address] [place],  [place] [address] PCP: Ria Bush, MD  Chief Complaint  Patient presents with  . Left Foot - Routine Post Op    05/16/19 left foot GT amputation       HPI: This is a pleasant gentleman who is 2 weeks status post left foot great toe amputation he continues to do well.  He feels swelling has gone down.  Pain is well controlled  Assessment & Plan: Visit Diagnoses: No diagnosis found.  Plan: Follow-up 1 week.  I am hoping that we can harvest sutures if appropriate at that time  Follow-Up Instructions: No follow-ups on file.   Ortho Exam  Patient is alert, oriented, no adenopathy, well-dressed, normal affect, normal respiratory effort. Focused examination demonstrates swelling to be well controlled wound is healing with healthy wound edges no surrounding cellulitis no drainage  Imaging: No results found. No images are attached to the encounter.  Labs: Lab Results  Component Value Date   HGBA1C 5.6 02/12/2019   HGBA1C 5.9 03/12/2015   LABURIC 4.2 02/05/2019   LABURIC 4.6 08/27/2017   LABURIC 4.4 08/23/2016   REPTSTATUS 04/20/2019 FINAL 04/16/2019   GRAMSTAIN  04/16/2019    NO WBC SEEN NO ORGANISMS SEEN Performed at [facility], [place] [address] [place], [place] [address]    CULT RARE STAPHYLOCOCCUS LUGDUNENSIS 04/16/2019   LABORGA STAPHYLOCOCCUS LUGDUNENSIS 04/16/2019     Lab Results  Component Value Date   ALBUMIN 4.0 02/05/2019   ALBUMIN 4.2 08/27/2017   ALBUMIN 4.1 08/23/2016   LABURIC 4.2 02/05/2019   LABURIC 4.6 08/27/2017   LABURIC 4.4 08/23/2016    No results found for: MG Lab Results  Component Value Date   VD25OH 39.4 03/12/2015    No results found for: PREALBUMIN CBC EXTENDED Latest Ref Rng & Units 05/16/2019 03/12/2015   WBC 4.0 - 10.5 K/uL 5.0 4.7  RBC 4.22 - 5.81 MIL/uL 4.12(L) -  HGB 13.0 - 17.0 g/dL 13.4 13.2(A)  HCT 39.0 - 52.0 % 40.6 -  PLT 150 - 400 K/uL 187 224     Body mass index is 35.28 kg/m.  Orders:  No orders of the defined types were placed in this encounter.  No orders of the defined types were placed in this encounter.    Procedures: No procedures performed  Clinical Data: No additional findings.  ROS:  All other systems negative, except as noted in the HPI. Review of Systems  Objective: Vital Signs: Ht 5\' 8"  (1.727 m)   Wt 232 lb (105.2 kg)   BMI 35.28 kg/m   Specialty Comments:  No specialty comments available.  PMFS History: Patient Active Problem List   Diagnosis Date Noted  . Osteomyelitis of great toe of left foot ([facility])   . Medicare annual wellness visit, subsequent 02/12/2019  . Hyperglycemia 02/12/2019  . Pre-ulcerative calluses 12/02/2018  . Infection of left great toe due to methicillin resistant Staphylococcus aureus (MRSA) 12/02/2018  . Nasal sinus congestion 11/14/2017  . Advanced care planning/counseling discussion 09/04/2017  . Dyslipidemia 09/04/2017  . Leg cramps 03/07/2017  . Carotid stenosis, asymptomatic, bilateral 09/16/2016  . Transaminitis 09/01/2016  . OSA  on CPAP 09/01/2016  . Sensorineural hearing loss (SNHL) of both ears 09/01/2016  . Obesity, Class I, BMI 30.0-34.9 (see actual BMI) 09/01/2016  . Open toe wound, sequela 08/31/2016  . GERD (gastroesophageal reflux disease)   . Gout   . HTN (hypertension)   . Osteoarthritis   . History of osteomyelitis 10/07/2015  . Personal history of malignant neoplasm of larynx 05/09/2007   Past Medical History:  Diagnosis Date  . Carotid stenosis, asymptomatic, bilateral 09/16/2016   Diffuse 1-39% bilaterally, but involving ICA, ECA, CCA - rpt 1 yr (09/2016)  . Complication of anesthesia    slow to wake up after a colonoscopy years ago  . Diverticulitis   . GERD (gastroesophageal reflux  disease)   . Gout   . History of osteomyelitis 10/2015   left foot  . History of squamous cell carcinoma of skin   . HTN (hypertension)   . OSA on CPAP 09/01/2016  . Osteoarthritis    back, neck, knees - multiple joints  . Personal history of malignant neoplasm of larynx 2009   s/p XRT, Skin Cancer- "pre cancerous"  . Pre-diabetes   . Seasonal allergies   . Sensorineural hearing loss (SNHL) of both ears 09/01/2016    Family History  Problem Relation Age of Onset  . Hypertension Mother   . Hypertension Father   . Cancer Other        unknown - niece    Past Surgical History:  Procedure Laterality Date  . ACHILLES TENDON REPAIR Left   . AMPUTATION Left 05/16/2019   Procedure: LEFT GREAT TOE AMPUTATION;  Surgeon: Newt Minion, MD;  Location: [facility];  Service: Orthopedics;  Laterality: Left;  . APPENDECTOMY    . COLONOSCOPY    . THROAT SURGERY N/A 2009   ?vocal cord vs larynx nodule removal, had XRT after this but states it was not cancer  . TONSILLECTOMY    . TREATMENT FISTULA ANAL     Social History   Occupational History  . Not on file  Tobacco Use  . Smoking status: Never Smoker  . Smokeless tobacco: Never Used  Substance and Sexual Activity  . Alcohol use: Yes    Alcohol/week: 4.0 standard drinks    Types: 4 Glasses of wine per week  . Drug use: No  . Sexual activity: Not on file

## 2019-05-31 ENCOUNTER — Encounter: Payer: Self-pay | Admitting: Family Medicine

## 2019-05-31 DIAGNOSIS — S62521A Displaced fracture of distal phalanx of right thumb, initial encounter for closed fracture: Secondary | ICD-10-CM | POA: Insufficient documentation

## 2019-06-02 ENCOUNTER — Encounter: Payer: Self-pay | Admitting: Family Medicine

## 2019-06-05 ENCOUNTER — Ambulatory Visit (INDEPENDENT_AMBULATORY_CARE_PROVIDER_SITE_OTHER): Payer: Medicare Other | Admitting: Physician Assistant

## 2019-06-05 ENCOUNTER — Other Ambulatory Visit: Payer: Self-pay

## 2019-06-05 ENCOUNTER — Encounter: Payer: Self-pay | Admitting: Physician Assistant

## 2019-06-05 DIAGNOSIS — M869 Osteomyelitis, unspecified: Secondary | ICD-10-CM

## 2019-06-05 NOTE — Progress Notes (Signed)
Office Visit Note   Patient: [name]           Date of Birth: 1931/07/15           MRN: [id number] Visit Date: 06/05/2019              Requested by: Ria Bush, MD [address] [place],  [place] [address] PCP: Ria Bush, MD  Chief Complaint  Patient presents with  . Left Foot - Routine Post Op      HPI: This is a pleasant gentleman who is 3 weeks status post great toe amputation.  He is doing well.  He has been doing daily dressing changes.  Assessment & Plan: Visit Diagnoses: No diagnosis found.  Plan: We will plan hopefully to harvest the sutures next week.  Follow-up at that time  Follow-Up Instructions: No follow-ups on file.   Ortho Exam  Patient is alert, oriented, no adenopathy, well-dressed, normal affect, normal respiratory effort. Focused examination wound is healing well.  I did debride a small area of superficial wound necrosis.  He still has some healing going on.  No surrounding cellulitis no drainage no odor.  Well approximated wound edges  Imaging: No results found. No images are attached to the encounter.  Labs: Lab Results  Component Value Date   HGBA1C 5.6 02/12/2019   HGBA1C 5.9 03/12/2015   LABURIC 4.2 02/05/2019   LABURIC 4.6 08/27/2017   LABURIC 4.4 08/23/2016   REPTSTATUS 04/20/2019 FINAL 04/16/2019   GRAMSTAIN  04/16/2019    NO WBC SEEN NO ORGANISMS SEEN Performed at [facility], [place] [address] [place], [place] [address]    CULT RARE STAPHYLOCOCCUS LUGDUNENSIS 04/16/2019   LABORGA STAPHYLOCOCCUS LUGDUNENSIS 04/16/2019     Lab Results  Component Value Date   ALBUMIN 4.0 02/05/2019   ALBUMIN 4.2 08/27/2017   ALBUMIN 4.1 08/23/2016   LABURIC 4.2 02/05/2019   LABURIC 4.6 08/27/2017   LABURIC 4.4 08/23/2016    No results found for: MG Lab Results  Component Value Date   VD25OH 39.4 03/12/2015    No results found for: PREALBUMIN CBC EXTENDED Latest Ref Rng & Units 05/16/2019 03/12/2015    WBC 4.0 - 10.5 K/uL 5.0 4.7  RBC 4.22 - 5.81 MIL/uL 4.12(L) -  HGB 13.0 - 17.0 g/dL 13.4 13.2(A)  HCT 39.0 - 52.0 % 40.6 -  PLT 150 - 400 K/uL 187 224     There is no height or weight on file to calculate BMI.  Orders:  No orders of the defined types were placed in this encounter.  No orders of the defined types were placed in this encounter.    Procedures: No procedures performed  Clinical Data: No additional findings.  ROS:  All other systems negative, except as noted in the HPI. Review of Systems  Objective: Vital Signs: There were no vitals taken for this visit.  Specialty Comments:  No specialty comments available.  PMFS History: Patient Active Problem List   Diagnosis Date Noted  . Closed displaced fracture of distal phalanx of right thumb 05/31/2019  . Osteomyelitis of great toe of left foot ([facility])   . Medicare annual wellness visit, subsequent 02/12/2019  . Hyperglycemia 02/12/2019  . Pre-ulcerative calluses 12/02/2018  . Infection of left great toe due to methicillin resistant Staphylococcus aureus (MRSA) 12/02/2018  . Nasal sinus congestion 11/14/2017  . Advanced care planning/counseling discussion 09/04/2017  . Dyslipidemia 09/04/2017  . Leg cramps 03/07/2017  . Carotid stenosis, asymptomatic,  bilateral 09/16/2016  . Transaminitis 09/01/2016  . OSA on CPAP 09/01/2016  . Sensorineural hearing loss (SNHL) of both ears 09/01/2016  . Obesity, Class I, BMI 30.0-34.9 (see actual BMI) 09/01/2016  . Open toe wound, sequela 08/31/2016  . GERD (gastroesophageal reflux disease)   . Gout   . HTN (hypertension)   . Osteoarthritis   . History of osteomyelitis 10/07/2015  . Personal history of malignant neoplasm of larynx 05/09/2007   Past Medical History:  Diagnosis Date  . Carotid stenosis, asymptomatic, bilateral 09/16/2016   Diffuse 1-39% bilaterally, but involving ICA, ECA, CCA - rpt 1 yr (09/2016)  . Complication of anesthesia    slow to wake up after  a colonoscopy years ago  . Diverticulitis   . GERD (gastroesophageal reflux disease)   . Gout   . History of osteomyelitis 10/2015   left foot  . History of squamous cell carcinoma of skin   . HTN (hypertension)   . OSA on CPAP 09/01/2016  . Osteoarthritis    back, neck, knees - multiple joints  . Personal history of malignant neoplasm of larynx 2009   s/p XRT, Skin Cancer- "pre cancerous"  . Pre-diabetes   . Seasonal allergies   . Sensorineural hearing loss (SNHL) of both ears 09/01/2016    Family History  Problem Relation Age of Onset  . Hypertension Mother   . Hypertension Father   . Cancer Other        unknown - niece    Past Surgical History:  Procedure Laterality Date  . ACHILLES TENDON REPAIR Left   . AMPUTATION Left 05/16/2019   LEFT GREAT TOE AMPUTATION for osteomyelitis Sharol Given, Illene Regulus, MD)  . APPENDECTOMY    . COLONOSCOPY    . THROAT SURGERY N/A 2009   ?vocal cord vs larynx nodule removal, had XRT after this but states it was not cancer  . TONSILLECTOMY    . TREATMENT FISTULA ANAL     Social History   Occupational History  . Not on file  Tobacco Use  . Smoking status: Never Smoker  . Smokeless tobacco: Never Used  Substance and Sexual Activity  . Alcohol use: Yes    Alcohol/week: 4.0 standard drinks    Types: 4 Glasses of wine per week  . Drug use: No  . Sexual activity: Not on file

## 2019-06-12 ENCOUNTER — Ambulatory Visit (INDEPENDENT_AMBULATORY_CARE_PROVIDER_SITE_OTHER): Payer: Medicare Other | Admitting: Physician Assistant

## 2019-06-12 ENCOUNTER — Ambulatory Visit: Payer: Medicare Other | Admitting: Physician Assistant

## 2019-06-12 ENCOUNTER — Other Ambulatory Visit: Payer: Self-pay

## 2019-06-12 ENCOUNTER — Encounter: Payer: Self-pay | Admitting: Physician Assistant

## 2019-06-12 VITALS — Ht 68.0 in | Wt 232.0 lb

## 2019-06-12 DIAGNOSIS — M869 Osteomyelitis, unspecified: Secondary | ICD-10-CM

## 2019-06-12 NOTE — Progress Notes (Signed)
Office Visit Note   Patient: [name]           Date of Birth: October 10, 1931           MRN: [id number] Visit Date: 06/12/2019              Requested by: Ria Bush, MD [address] [place],  [place] [address] PCP: Ria Bush, MD  Chief Complaint  Patient presents with  . Left Foot - Routine Post Op    05/16/19 left great toe amputation       HPI: Patient presents today almost 1 month status post left great toe amputation he is doing well and has no complaints  Assessment & Plan: Visit Diagnoses: No diagnosis found.  Plan: He may transition to a shoe that is supportive and has a high toe box and does not put any pressure over the end of his foot.  He will follow up in 2 weeks.  Follow-Up Instructions: No follow-ups on file.   Ortho Exam  Patient is alert, oriented, no adenopathy, well-dressed, normal affect, normal respiratory effort. Incision is healed quite well.  Mild amount of soft tissue swelling no surrounding cellulitis or drainage.  Sutures were harvested today  Imaging: No results found. No images are attached to the encounter.  Labs: Lab Results  Component Value Date   HGBA1C 5.6 02/12/2019   HGBA1C 5.9 03/12/2015   LABURIC 4.2 02/05/2019   LABURIC 4.6 08/27/2017   LABURIC 4.4 08/23/2016   REPTSTATUS 04/20/2019 FINAL 04/16/2019   GRAMSTAIN  04/16/2019    NO WBC SEEN NO ORGANISMS SEEN Performed at [facility], [place] [address], [place], [place] [address]    CULT RARE STAPHYLOCOCCUS LUGDUNENSIS 04/16/2019   LABORGA STAPHYLOCOCCUS LUGDUNENSIS 04/16/2019     Lab Results  Component Value Date   ALBUMIN 4.0 02/05/2019   ALBUMIN 4.2 08/27/2017   ALBUMIN 4.1 08/23/2016   LABURIC 4.2 02/05/2019   LABURIC 4.6 08/27/2017   LABURIC 4.4 08/23/2016    No results found for: MG Lab Results  Component Value Date   VD25OH 39.4 03/12/2015    No results found for: PREALBUMIN CBC EXTENDED Latest Ref Rng & Units 05/16/2019  03/12/2015  WBC 4.0 - 10.5 K/uL 5.0 4.7  RBC 4.22 - 5.81 MIL/uL 4.12(L) -  HGB 13.0 - 17.0 g/dL 13.4 13.2(A)  HCT 39.0 - 52.0 % 40.6 -  PLT 150 - 400 K/uL 187 224     Body mass index is 35.28 kg/m.  Orders:  No orders of the defined types were placed in this encounter.  No orders of the defined types were placed in this encounter.    Procedures: No procedures performed  Clinical Data: No additional findings.  ROS:  All other systems negative, except as noted in the HPI. Review of Systems  Objective: Vital Signs: Ht 5\' 8"  (1.727 m)   Wt 232 lb (105.2 kg)   BMI 35.28 kg/m   Specialty Comments:  No specialty comments available.  PMFS History: Patient Active Problem List   Diagnosis Date Noted  . Closed displaced fracture of distal phalanx of right thumb 05/31/2019  . Osteomyelitis of great toe of left foot ([place])   . Medicare annual wellness visit, subsequent 02/12/2019  . Hyperglycemia 02/12/2019  . Pre-ulcerative calluses 12/02/2018  . Infection of left great toe due to methicillin resistant Staphylococcus aureus (MRSA) 12/02/2018  . Nasal sinus congestion 11/14/2017  . Advanced care planning/counseling discussion 09/04/2017  . Dyslipidemia 09/04/2017  .  Leg cramps 03/07/2017  . Carotid stenosis, asymptomatic, bilateral 09/16/2016  . Transaminitis 09/01/2016  . OSA on CPAP 09/01/2016  . Sensorineural hearing loss (SNHL) of both ears 09/01/2016  . Obesity, Class I, BMI 30.0-34.9 (see actual BMI) 09/01/2016  . Open toe wound, sequela 08/31/2016  . GERD (gastroesophageal reflux disease)   . Gout   . HTN (hypertension)   . Osteoarthritis   . History of osteomyelitis 10/07/2015  . Personal history of malignant neoplasm of larynx 05/09/2007   Past Medical History:  Diagnosis Date  . Carotid stenosis, asymptomatic, bilateral 09/16/2016   Diffuse 1-39% bilaterally, but involving ICA, ECA, CCA - rpt 1 yr (09/2016)  . Complication of anesthesia    slow to wake up  after a colonoscopy years ago  . Diverticulitis   . GERD (gastroesophageal reflux disease)   . Gout   . History of osteomyelitis 10/2015   left foot  . History of squamous cell carcinoma of skin   . HTN (hypertension)   . OSA on CPAP 09/01/2016  . Osteoarthritis    back, neck, knees - multiple joints  . Personal history of malignant neoplasm of larynx 2009   s/p XRT, Skin Cancer- "pre cancerous"  . Pre-diabetes   . Seasonal allergies   . Sensorineural hearing loss (SNHL) of both ears 09/01/2016    Family History  Problem Relation Age of Onset  . Hypertension Mother   . Hypertension Father   . Cancer Other        unknown - niece    Past Surgical History:  Procedure Laterality Date  . ACHILLES TENDON REPAIR Left   . AMPUTATION Left 05/16/2019   LEFT GREAT TOE AMPUTATION for osteomyelitis Sharol Given, Illene Regulus, MD)  . APPENDECTOMY    . COLONOSCOPY    . THROAT SURGERY N/A 2009   ?vocal cord vs larynx nodule removal, had XRT after this but states it was not cancer  . TONSILLECTOMY    . TREATMENT FISTULA ANAL     Social History   Occupational History  . Not on file  Tobacco Use  . Smoking status: Never Smoker  . Smokeless tobacco: Never Used  Substance and Sexual Activity  . Alcohol use: Yes    Alcohol/week: 4.0 standard drinks    Types: 4 Glasses of wine per week  . Drug use: No  . Sexual activity: Not on file

## 2019-06-13 ENCOUNTER — Encounter: Payer: Self-pay | Admitting: Physician Assistant

## 2019-06-17 DIAGNOSIS — S62521A Displaced fracture of distal phalanx of right thumb, initial encounter for closed fracture: Secondary | ICD-10-CM | POA: Diagnosis not present

## 2019-06-26 ENCOUNTER — Ambulatory Visit: Payer: Medicare Other | Admitting: Physician Assistant

## 2019-06-30 ENCOUNTER — Ambulatory Visit: Payer: Self-pay

## 2019-06-30 ENCOUNTER — Other Ambulatory Visit: Payer: Self-pay

## 2019-06-30 ENCOUNTER — Ambulatory Visit (INDEPENDENT_AMBULATORY_CARE_PROVIDER_SITE_OTHER): Payer: Medicare Other | Admitting: Orthopedic Surgery

## 2019-06-30 ENCOUNTER — Encounter: Payer: Self-pay | Admitting: Physician Assistant

## 2019-06-30 VITALS — Ht 68.0 in | Wt 232.0 lb

## 2019-06-30 DIAGNOSIS — M6702 Short Achilles tendon (acquired), left ankle: Secondary | ICD-10-CM | POA: Diagnosis not present

## 2019-06-30 DIAGNOSIS — M9905 Segmental and somatic dysfunction of pelvic region: Secondary | ICD-10-CM | POA: Diagnosis not present

## 2019-06-30 DIAGNOSIS — M9903 Segmental and somatic dysfunction of lumbar region: Secondary | ICD-10-CM | POA: Diagnosis not present

## 2019-06-30 DIAGNOSIS — M5416 Radiculopathy, lumbar region: Secondary | ICD-10-CM | POA: Diagnosis not present

## 2019-06-30 DIAGNOSIS — M79675 Pain in left toe(s): Secondary | ICD-10-CM | POA: Diagnosis not present

## 2019-06-30 DIAGNOSIS — M955 Acquired deformity of pelvis: Secondary | ICD-10-CM | POA: Diagnosis not present

## 2019-06-30 MED ORDER — DOXYCYCLINE HYCLATE 100 MG PO TABS: 100.0000 mg | ORAL_TABLET | Freq: Two times a day (BID) | ORAL | 0 refills | Status: DC

## 2019-06-30 NOTE — Progress Notes (Signed)
Office Visit Note   Patient: [name]           Date of Birth: Apr 20, 1932           MRN: [id number] Visit Date: 06/30/2019              Requested by: Ria Bush, MD [address] [place],  [place] [address] PCP: Ria Bush, MD  Chief Complaint  Patient presents with  . Left Foot - Routine Post Op    Left great toe amputation DOS 05/16/2019      HPI: Patient is an 86 year old gentleman who is status post a left great toe amputation about 6 weeks ago.  Patient states that he has been having some redness and swelling and ulceration to the second toe secondary to stubbed his toe since the last visit.  Assessment & Plan: Visit Diagnoses:  1. Toe pain, left     Plan: With the redness and ulcer I am concerned with possible osteomyelitis we will start him on doxycycline follow-up in 4 weeks and obtain three-view radiographs of the left forefoot at follow-up.  Patient was given instruction and demonstrated Achilles stretching to unload pressure from the second toe.  Follow-Up Instructions: Return in about 4 weeks (around 07/28/2019).   Ortho Exam  Patient is alert, oriented, no adenopathy, well-dressed, normal affect, normal respiratory effort. On examination patient has good pulses the incision is healing nicely from the great toe amputation.  Patient does have significant Achilles contracture with dorsiflexion 20 degrees short of neutral with his knee extended.  This is overloading the second toe which has an ulcer this does not probe to bone there is redness and swelling of the second toe.  Imaging: No results found. No images are attached to the encounter.  Labs: Lab Results  Component Value Date   HGBA1C 5.6 02/12/2019   HGBA1C 5.9 03/12/2015   LABURIC 4.2 02/05/2019   LABURIC 4.6 08/27/2017   LABURIC 4.4 08/23/2016   REPTSTATUS 04/20/2019 FINAL 04/16/2019   GRAMSTAIN  04/16/2019    NO WBC SEEN NO ORGANISMS SEEN Performed at [facility], [place] [address], [place], [place] [address]    CULT RARE STAPHYLOCOCCUS LUGDUNENSIS 04/16/2019   LABORGA STAPHYLOCOCCUS LUGDUNENSIS 04/16/2019     Lab Results  Component Value Date   ALBUMIN 4.0 02/05/2019   ALBUMIN 4.2 08/27/2017   ALBUMIN 4.1 08/23/2016   LABURIC 4.2 02/05/2019   LABURIC 4.6 08/27/2017   LABURIC 4.4 08/23/2016    No results found for: MG Lab Results  Component Value Date   VD25OH 39.4 03/12/2015    No results found for: PREALBUMIN CBC EXTENDED Latest Ref Rng & Units 05/16/2019 03/12/2015  WBC 4.0 - 10.5 K/uL 5.0 4.7  RBC 4.22 - 5.81 MIL/uL 4.12(L) -  HGB 13.0 - 17.0 g/dL 13.4 13.2(A)  HCT 39.0 - 52.0 % 40.6 -  PLT 150 - 400 K/uL 187 224     Body mass index is 35.28 kg/m.  Orders:  No orders of the defined types were placed in this encounter.  Meds ordered this encounter  Medications  . doxycycline (VIBRA-TABS) 100 MG tablet    Sig: Take 1 tablet (100 mg total) by mouth 2 (two) times daily.    Dispense:  60 tablet    Refill:  0     Procedures: No procedures performed  Clinical Data: No additional findings.  ROS:  All other systems negative, except as noted in the HPI. Review of  Systems  Objective: Vital Signs: Ht 5\' 8"  (1.727 m)   Wt 232 lb (105.2 kg)   BMI 35.28 kg/m   Specialty Comments:  No specialty comments available.  PMFS History: Patient Active Problem List   Diagnosis Date Noted  . Closed displaced fracture of distal phalanx of right thumb 05/31/2019  . Osteomyelitis of great toe of left foot ([place])   . Medicare annual wellness visit, subsequent 02/12/2019  . Hyperglycemia 02/12/2019  . Pre-ulcerative calluses 12/02/2018  . Infection of left great toe due to methicillin resistant Staphylococcus aureus (MRSA) 12/02/2018  . Nasal sinus congestion 11/14/2017  . Advanced care planning/counseling discussion 09/04/2017  . Dyslipidemia 09/04/2017  . Leg cramps 03/07/2017  . Carotid stenosis, asymptomatic,  bilateral 09/16/2016  . Transaminitis 09/01/2016  . OSA on CPAP 09/01/2016  . Sensorineural hearing loss (SNHL) of both ears 09/01/2016  . Obesity, Class I, BMI 30.0-34.9 (see actual BMI) 09/01/2016  . Open toe wound, sequela 08/31/2016  . GERD (gastroesophageal reflux disease)   . Gout   . HTN (hypertension)   . Osteoarthritis   . History of osteomyelitis 10/07/2015  . Personal history of malignant neoplasm of larynx 05/09/2007   Past Medical History:  Diagnosis Date  . Carotid stenosis, asymptomatic, bilateral 09/16/2016   Diffuse 1-39% bilaterally, but involving ICA, ECA, CCA - rpt 1 yr (09/2016)  . Complication of anesthesia    slow to wake up after a colonoscopy years ago  . Diverticulitis   . GERD (gastroesophageal reflux disease)   . Gout   . History of osteomyelitis 10/2015   left foot  . History of squamous cell carcinoma of skin   . HTN (hypertension)   . OSA on CPAP 09/01/2016  . Osteoarthritis    back, neck, knees - multiple joints  . Personal history of malignant neoplasm of larynx 2009   s/p XRT, Skin Cancer- "pre cancerous"  . Pre-diabetes   . Seasonal allergies   . Sensorineural hearing loss (SNHL) of both ears 09/01/2016    Family History  Problem Relation Age of Onset  . Hypertension Mother   . Hypertension Father   . Cancer Other        unknown - niece    Past Surgical History:  Procedure Laterality Date  . ACHILLES TENDON REPAIR Left   . AMPUTATION Left 05/16/2019   LEFT GREAT TOE AMPUTATION for osteomyelitis Sharol Given, Illene Regulus, MD)  . APPENDECTOMY    . COLONOSCOPY    . THROAT SURGERY N/A 2009   ?vocal cord vs larynx nodule removal, had XRT after this but states it was not cancer  . TONSILLECTOMY    . TREATMENT FISTULA ANAL     Social History   Occupational History  . Not on file  Tobacco Use  . Smoking status: Never Smoker  . Smokeless tobacco: Never Used  Substance and Sexual Activity  . Alcohol use: Yes    Alcohol/week: 4.0 standard drinks      Types: 4 Glasses of wine per week  . Drug use: No  . Sexual activity: Not on file

## 2019-07-02 DIAGNOSIS — M9905 Segmental and somatic dysfunction of pelvic region: Secondary | ICD-10-CM | POA: Diagnosis not present

## 2019-07-02 DIAGNOSIS — M5416 Radiculopathy, lumbar region: Secondary | ICD-10-CM | POA: Diagnosis not present

## 2019-07-02 DIAGNOSIS — M955 Acquired deformity of pelvis: Secondary | ICD-10-CM | POA: Diagnosis not present

## 2019-07-02 DIAGNOSIS — M9903 Segmental and somatic dysfunction of lumbar region: Secondary | ICD-10-CM | POA: Diagnosis not present

## 2019-07-04 DIAGNOSIS — M5416 Radiculopathy, lumbar region: Secondary | ICD-10-CM | POA: Diagnosis not present

## 2019-07-04 DIAGNOSIS — M9905 Segmental and somatic dysfunction of pelvic region: Secondary | ICD-10-CM | POA: Diagnosis not present

## 2019-07-04 DIAGNOSIS — M9903 Segmental and somatic dysfunction of lumbar region: Secondary | ICD-10-CM | POA: Diagnosis not present

## 2019-07-04 DIAGNOSIS — M955 Acquired deformity of pelvis: Secondary | ICD-10-CM | POA: Diagnosis not present

## 2019-07-09 ENCOUNTER — Encounter: Payer: Self-pay | Admitting: Orthopedic Surgery

## 2019-07-09 ENCOUNTER — Telehealth: Payer: Self-pay | Admitting: Orthopedic Surgery

## 2019-07-09 NOTE — Telephone Encounter (Signed)
I called and sw pt's wife to advise that she should keep the area clean and covered. Continue with abx, make appt as soon as they get back into town and call if there are any changes. Pt's wife voiced understanding and will call with questions.

## 2019-07-09 NOTE — Telephone Encounter (Signed)
Patient's wife Stanton Kidney called asking if Dr Sharol Given received the [employer] and pictures of Mr Gropp toe? Mary asked for a call back as soon as possible. The number to contact Stanton Kidney is [phone]

## 2019-07-09 NOTE — Telephone Encounter (Signed)
Message was received and send response. To call with any other questions.

## 2019-07-09 NOTE — Telephone Encounter (Signed)
Pts wife, Stanton Kidney called stating the pt recently had his big toe on the left foot amputated and now the toe directly beside the amputee has formed an ulcer. Stanton Kidney stated they were cleared to travel and now they're in [place] and she's not sure what to do. Stanton Kidney would like a call back explaining her best choice of action?  [phone]

## 2019-07-10 DIAGNOSIS — L97529 Non-pressure chronic ulcer of other part of left foot with unspecified severity: Secondary | ICD-10-CM | POA: Diagnosis not present

## 2019-07-10 DIAGNOSIS — M869 Osteomyelitis, unspecified: Secondary | ICD-10-CM | POA: Diagnosis not present

## 2019-07-10 DIAGNOSIS — R7301 Impaired fasting glucose: Secondary | ICD-10-CM | POA: Diagnosis present

## 2019-07-10 DIAGNOSIS — E669 Obesity, unspecified: Secondary | ICD-10-CM | POA: Diagnosis present

## 2019-07-10 DIAGNOSIS — Z89412 Acquired absence of left great toe: Secondary | ICD-10-CM | POA: Diagnosis not present

## 2019-07-10 DIAGNOSIS — I890 Lymphedema, not elsewhere classified: Secondary | ICD-10-CM | POA: Diagnosis not present

## 2019-07-10 DIAGNOSIS — L03115 Cellulitis of right lower limb: Secondary | ICD-10-CM | POA: Diagnosis not present

## 2019-07-10 DIAGNOSIS — E1169 Type 2 diabetes mellitus with other specified complication: Secondary | ICD-10-CM | POA: Diagnosis not present

## 2019-07-10 DIAGNOSIS — E6609 Other obesity due to excess calories: Secondary | ICD-10-CM | POA: Diagnosis not present

## 2019-07-10 DIAGNOSIS — N179 Acute kidney failure, unspecified: Secondary | ICD-10-CM | POA: Diagnosis not present

## 2019-07-10 DIAGNOSIS — R609 Edema, unspecified: Secondary | ICD-10-CM | POA: Diagnosis present

## 2019-07-10 DIAGNOSIS — I10 Essential (primary) hypertension: Secondary | ICD-10-CM | POA: Diagnosis not present

## 2019-07-10 DIAGNOSIS — L97521 Non-pressure chronic ulcer of other part of left foot limited to breakdown of skin: Secondary | ICD-10-CM | POA: Diagnosis not present

## 2019-07-10 DIAGNOSIS — Z7982 Long term (current) use of aspirin: Secondary | ICD-10-CM | POA: Diagnosis not present

## 2019-07-10 DIAGNOSIS — Z20822 Contact with and (suspected) exposure to covid-19: Secondary | ICD-10-CM | POA: Diagnosis not present

## 2019-07-10 DIAGNOSIS — Z6833 Body mass index (BMI) 33.0-33.9, adult: Secondary | ICD-10-CM | POA: Diagnosis not present

## 2019-07-10 DIAGNOSIS — M868X7 Other osteomyelitis, ankle and foot: Secondary | ICD-10-CM | POA: Diagnosis not present

## 2019-07-10 DIAGNOSIS — M109 Gout, unspecified: Secondary | ICD-10-CM | POA: Diagnosis present

## 2019-07-10 DIAGNOSIS — L03032 Cellulitis of left toe: Secondary | ICD-10-CM | POA: Diagnosis present

## 2019-07-10 DIAGNOSIS — R739 Hyperglycemia, unspecified: Secondary | ICD-10-CM | POA: Diagnosis present

## 2019-07-10 DIAGNOSIS — M86172 Other acute osteomyelitis, left ankle and foot: Secondary | ICD-10-CM | POA: Diagnosis present

## 2019-07-10 DIAGNOSIS — R600 Localized edema: Secondary | ICD-10-CM | POA: Diagnosis not present

## 2019-07-10 DIAGNOSIS — L03116 Cellulitis of left lower limb: Secondary | ICD-10-CM | POA: Diagnosis not present

## 2019-07-10 DIAGNOSIS — R509 Fever, unspecified: Secondary | ICD-10-CM | POA: Diagnosis not present

## 2019-07-11 ENCOUNTER — Telehealth: Payer: Self-pay | Admitting: Family Medicine

## 2019-07-11 NOTE — Telephone Encounter (Signed)
Faxed list.  Notified pt's wife, Stanton Kidney (on dpr).  Expresses her thanks.  She wanted to make Dr. Darnell Level aware of what is going on. Says they are visiting their son when all of this took place. FYI to Dr. Darnell Level.

## 2019-07-11 NOTE — Telephone Encounter (Addendum)
Noted. Thanks.  Called and spoke with wife Vaughan Basta.  Planning surgery for that toe

## 2019-07-11 NOTE — Telephone Encounter (Signed)
Spouse called stating is in being admitted to [facility] down town right now   They need medication list faxed to them ASAP!!  ATTNSharol Given [phone]  FAX  osteomyelitis in left foot  Next to big toe they have already taken big toe  Please let spouse know when this has been done

## 2019-07-17 DIAGNOSIS — I70202 Unspecified atherosclerosis of native arteries of extremities, left leg: Secondary | ICD-10-CM | POA: Diagnosis not present

## 2019-07-17 DIAGNOSIS — M86172 Other acute osteomyelitis, left ankle and foot: Secondary | ICD-10-CM | POA: Diagnosis not present

## 2019-07-17 DIAGNOSIS — L03032 Cellulitis of left toe: Secondary | ICD-10-CM | POA: Diagnosis not present

## 2019-07-22 DIAGNOSIS — M9905 Segmental and somatic dysfunction of pelvic region: Secondary | ICD-10-CM | POA: Diagnosis not present

## 2019-07-22 DIAGNOSIS — M9903 Segmental and somatic dysfunction of lumbar region: Secondary | ICD-10-CM | POA: Diagnosis not present

## 2019-07-22 DIAGNOSIS — M5416 Radiculopathy, lumbar region: Secondary | ICD-10-CM | POA: Diagnosis not present

## 2019-07-22 DIAGNOSIS — M955 Acquired deformity of pelvis: Secondary | ICD-10-CM | POA: Diagnosis not present

## 2019-07-23 DIAGNOSIS — M79672 Pain in left foot: Secondary | ICD-10-CM | POA: Diagnosis not present

## 2019-07-31 ENCOUNTER — Ambulatory Visit: Payer: Medicare Other | Admitting: Physician Assistant

## 2019-07-31 DIAGNOSIS — M79672 Pain in left foot: Secondary | ICD-10-CM | POA: Diagnosis not present

## 2019-08-12 DIAGNOSIS — M9903 Segmental and somatic dysfunction of lumbar region: Secondary | ICD-10-CM | POA: Diagnosis not present

## 2019-08-12 DIAGNOSIS — M5416 Radiculopathy, lumbar region: Secondary | ICD-10-CM | POA: Diagnosis not present

## 2019-08-12 DIAGNOSIS — M9905 Segmental and somatic dysfunction of pelvic region: Secondary | ICD-10-CM | POA: Diagnosis not present

## 2019-08-12 DIAGNOSIS — M955 Acquired deformity of pelvis: Secondary | ICD-10-CM | POA: Diagnosis not present

## 2019-08-14 DIAGNOSIS — M79672 Pain in left foot: Secondary | ICD-10-CM | POA: Diagnosis not present

## 2019-08-18 ENCOUNTER — Ambulatory Visit (INDEPENDENT_AMBULATORY_CARE_PROVIDER_SITE_OTHER): Payer: Medicare Other | Admitting: Dermatology

## 2019-08-18 ENCOUNTER — Encounter: Payer: Self-pay | Admitting: Dermatology

## 2019-08-18 ENCOUNTER — Other Ambulatory Visit: Payer: Self-pay

## 2019-08-18 DIAGNOSIS — Z1283 Encounter for screening for malignant neoplasm of skin: Secondary | ICD-10-CM

## 2019-08-18 DIAGNOSIS — S98112A Complete traumatic amputation of left great toe, initial encounter: Secondary | ICD-10-CM

## 2019-08-18 DIAGNOSIS — M869 Osteomyelitis, unspecified: Secondary | ICD-10-CM

## 2019-08-18 DIAGNOSIS — L570 Actinic keratosis: Secondary | ICD-10-CM | POA: Diagnosis not present

## 2019-08-18 DIAGNOSIS — Z85828 Personal history of other malignant neoplasm of skin: Secondary | ICD-10-CM

## 2019-08-18 DIAGNOSIS — L814 Other melanin hyperpigmentation: Secondary | ICD-10-CM | POA: Diagnosis not present

## 2019-08-18 DIAGNOSIS — S98132A Complete traumatic amputation of one left lesser toe, initial encounter: Secondary | ICD-10-CM

## 2019-08-18 DIAGNOSIS — L578 Other skin changes due to chronic exposure to nonionizing radiation: Secondary | ICD-10-CM

## 2019-08-18 DIAGNOSIS — L820 Inflamed seborrheic keratosis: Secondary | ICD-10-CM

## 2019-08-18 DIAGNOSIS — D229 Melanocytic nevi, unspecified: Secondary | ICD-10-CM

## 2019-08-18 DIAGNOSIS — L821 Other seborrheic keratosis: Secondary | ICD-10-CM | POA: Diagnosis not present

## 2019-08-18 DIAGNOSIS — D1800 Hemangioma unspecified site: Secondary | ICD-10-CM

## 2019-08-18 DIAGNOSIS — I8310 Varicose veins of unspecified lower extremity with inflammation: Secondary | ICD-10-CM
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
## 2019-08-19 ENCOUNTER — Encounter: Payer: Self-pay | Admitting: Dermatology

## 2019-08-20 ENCOUNTER — Telehealth: Payer: Self-pay | Admitting: Family Medicine

## 2019-08-20 ENCOUNTER — Encounter: Payer: Self-pay | Admitting: Family Medicine

## 2019-08-20 ENCOUNTER — Other Ambulatory Visit: Payer: Self-pay

## 2019-08-20 ENCOUNTER — Ambulatory Visit (INDEPENDENT_AMBULATORY_CARE_PROVIDER_SITE_OTHER): Payer: Medicare Other | Admitting: Family Medicine

## 2019-08-20 VITALS — BP 120/80 | HR 69 | Temp 97.6°F | Ht 68.75 in | Wt 232.1 lb

## 2019-08-20 DIAGNOSIS — M1A00X0 Idiopathic chronic gout, unspecified site, without tophus (tophi): Secondary | ICD-10-CM

## 2019-08-20 DIAGNOSIS — S98131A Complete traumatic amputation of one right lesser toe, initial encounter: Secondary | ICD-10-CM

## 2019-08-20 DIAGNOSIS — Z8739 Personal history of other diseases of the musculoskeletal system and connective tissue: Secondary | ICD-10-CM | POA: Diagnosis not present

## 2019-08-20 DIAGNOSIS — I10 Essential (primary) hypertension: Secondary | ICD-10-CM | POA: Diagnosis not present

## 2019-08-20 DIAGNOSIS — S98139A Complete traumatic amputation of one unspecified lesser toe, initial encounter: Secondary | ICD-10-CM | POA: Insufficient documentation

## 2019-08-20 NOTE — Assessment & Plan Note (Addendum)
S/p 1st/2nd R toe amputation due to osteomyelitis (05/2019, 07/2019) Healing well. Will await [facility] hospital records.

## 2019-08-20 NOTE — Assessment & Plan Note (Signed)
Chronic, stable. Continue current regimen. 

## 2019-08-20 NOTE — Progress Notes (Signed)
This visit was conducted in person.  BP 120/80 (BP Location: Right Arm, Patient Position: Sitting, Cuff Size: Normal)   Pulse 69   Temp 97.6 F (36.4 C) (Temporal)   Ht 5' 8.75" (1.746 m)   Wt 232 lb 1 oz (105.3 kg)   SpO2 97%   BMI 34.52 kg/m    CC: hospital f/u visit, 6 mo f/u visit  Subjective:    Patient ID: [name], male    DOB: 1931/10/14, 86 y.o.   MRN: [id number]  HPI: [name] is a 86 y.o. male presenting on 08/20/2019 for Follow-up (Here for 6 mo f/u. )   Recent hospitalization last month in [place] on trip to visit family for L foot osteomyelitis s/p R 2nd toe amputation. Records have been requested. He was seeing ortho Dr Sharol Given on doxycycline, prior to this he had completed L great toe amputation 05/2019 for osteomyelitis as well.   Left foot healed well. Using bag balm.  Ambulating fine.  No further pain or fevers/chills.  Has ortho shoes but not using today.   Gout - doing well on lower allopurinol 200mg  dose without residual gout flare.   COVID vaccine - completed [employer].      Relevant past medical, surgical, family and social history reviewed and updated as indicated. Interim medical history since our last visit reviewed. Allergies and medications reviewed and updated. Outpatient Medications Prior to Visit  Medication Sig Dispense Refill  . allopurinol (ZYLOPRIM) 100 MG tablet Take 2 tablets (200 mg total) by mouth daily. 180 tablet 3  . aspirin EC 81 MG tablet Take 81 mg by mouth daily.    . carboxymethylcellulose (REFRESH TEARS) 0.5 % SOLN Place 1 drop into both eyes daily as needed (dry eyes).    . cetirizine (ZYRTEC) 10 MG tablet Take 10 mg by mouth daily. Alternates weekly with allegra.    . fexofenadine (ALLEGRA) 180 MG tablet Take 180 mg by mouth daily. Alternates weekly with zyrtec.    Marland Kitchen GLUCOSAMINE-CHONDROITIN PO Take 2 tablets by mouth daily.    . hydrochlorothiazide (MICROZIDE) 12.5 MG capsule TAKE 1 CAPSULE(12.5 MG) BY MOUTH  DAILY (Patient taking differently: Take 12.5 mg by mouth daily. ) 90 capsule 3  . metoprolol succinate (TOPROL-XL) 25 MG 24 hr tablet TAKE 1 TABLET BY MOUTH DAILY. TAKE WITH OR IMMEDIATELY FOLLOWING A MEAL (Patient taking differently: Take 25 mg by mouth daily. TAKE 1 TABLET BY MOUTH DAILY. TAKE WITH OR IMMEDIATELY FOLLOWING A MEAL) 90 tablet 3  . Multiple Vitamins-Minerals (MULTIVITAMIN ADULT PO) Take 1 tablet by mouth daily.    . naproxen sodium (ALEVE) 220 MG tablet Take 440 mg by mouth daily.    Marland Kitchen omeprazole (PRILOSEC) 20 MG capsule Take 20 mg by mouth daily.    . polyethylene glycol (MIRALAX / GLYCOLAX) packet Take 8.5 g by mouth daily.     . Potassium 99 MG TABS Take 99 mg by mouth daily.    Marland Kitchen doxycycline (VIBRA-TABS) 100 MG tablet Take 1 tablet (100 mg total) by mouth 2 (two) times daily. (Patient not taking: Reported on 08/18/2019) 60 tablet 0  . HYDROcodone-acetaminophen (NORCO/VICODIN) 5-325 MG tablet Take 1 tablet by mouth every 4 (four) hours as needed for moderate pain. (Patient not taking: Reported on 08/18/2019) 30 tablet 0  . Lactobacillus (ACIDOPHILUS PO) Take 1 capsule by mouth daily.     . Milk Thistle 1000 MG CAPS Take 1,000 mg by mouth daily.      No  facility-administered medications prior to visit.     Per HPI unless specifically indicated in ROS section below Review of Systems Objective:    BP 120/80 (BP Location: Right Arm, Patient Position: Sitting, Cuff Size: Normal)   Pulse 69   Temp 97.6 F (36.4 C) (Temporal)   Ht 5' 8.75" (1.746 m)   Wt 232 lb 1 oz (105.3 kg)   SpO2 97%   BMI 34.52 kg/m   Wt Readings from Last 3 Encounters:  08/20/19 232 lb 1 oz (105.3 kg)  06/30/19 232 lb (105.2 kg)  06/12/19 232 lb (105.2 kg)    Physical Exam Vitals and nursing note reviewed.  Constitutional:      Appearance: Normal appearance. He is not ill-appearing.  Cardiovascular:     Rate and Rhythm: Normal rate and regular rhythm.     Pulses: Normal pulses.     Heart  sounds: Normal heart sounds. No murmur.  Pulmonary:     Effort: Pulmonary effort is normal. No respiratory distress.     Breath sounds: Normal breath sounds. No wheezing, rhonchi or rales.  Musculoskeletal:        General: Normal range of motion.     Right lower leg: Edema (tr) present.     Left lower leg: Edema (tr) present.     Comments:  1+ DP bilat S/p R 1st/2nd toe amputation, healed well  Skin:    General: Skin is warm and dry.     Findings: No erythema or rash.  Neurological:     Mental Status: He is alert.       Results for orders placed or performed during the hospital encounter of 05/16/19  Respiratory Panel by RT PCR (Flu A&B, Covid) - Nasopharyngeal Swab   Specimen: Nasopharyngeal Swab  Result Value Ref Range   SARS Coronavirus 2 by RT PCR NEGATIVE NEGATIVE   Influenza A by PCR NEGATIVE NEGATIVE   Influenza B by PCR NEGATIVE NEGATIVE  CBC  Result Value Ref Range   WBC 5.0 4.0 - 10.5 K/uL   RBC 4.12 (L) 4.22 - 5.81 MIL/uL   Hemoglobin 13.4 13.0 - 17.0 g/dL   HCT 40.6 39.0 - 52.0 %   MCV 98.5 80.0 - 100.0 fL   MCH 32.5 26.0 - 34.0 pg   MCHC 33.0 30.0 - 36.0 g/dL   RDW 13.8 11.5 - 15.5 %   Platelets 187 150 - 400 K/uL   nRBC 0.0 0.0 - 0.2 %  Basic metabolic panel  Result Value Ref Range   Sodium 137 135 - 145 mmol/L   Potassium 4.1 3.5 - 5.1 mmol/L   Chloride 103 98 - 111 mmol/L   CO2 22 22 - 32 mmol/L   Glucose, Bld 109 (H) 70 - 99 mg/dL   BUN 25 (H) 8 - 23 mg/dL   Creatinine, Ser 1.10 0.61 - 1.24 mg/dL   Calcium 8.7 (L) 8.9 - 10.3 mg/dL   GFR calc non Af Amer >60 >60 mL/min   GFR calc Af Amer >60 >60 mL/min   Anion gap 12 5 - 15   Assessment & Plan:  This visit occurred during the SARS-CoV-2 public health emergency.  Safety protocols were in place, including screening questions prior to the visit, additional usage of staff PPE, and extensive cleaning of exam room while observing appropriate contact time as indicated for disinfecting solutions.    Problem List Items Addressed This Visit    HTN (hypertension)    Chronic, stable. Continue current regimen  History of osteomyelitis   Gout    Stable period on lower allopurinol dose - continue.       Amputation of one or more toes ([place]) - Primary    S/p 1st/2nd R toe amputation due to osteomyelitis (05/2019, 07/2019) Healing well. Will await [facility] hospital records.           No orders of the defined types were placed in this encounter.  No orders of the defined types were placed in this encounter.   Patient Instructions  You are doing well today I'm glad you've recovered from foot surgery.  Continue current medicines.  Return as needed or in 6 months for wellness visit   Follow up plan: Return in about 6 months (around 02/19/2020) for medicare wellness visit.  Ria Bush, MD

## 2019-08-20 NOTE — Telephone Encounter (Signed)
Patient wants to know if he needs to follow up with Amy Lomax,NP? He said Dr.G told him he was doing really well.

## 2019-08-20 NOTE — Assessment & Plan Note (Signed)
Stable period on lower allopurinol dose - continue.

## 2019-08-20 NOTE — Patient Instructions (Addendum)
You are doing well today I'm glad you've recovered from foot surgery.  Continue current medicines.  Return as needed or in 6 months for wellness visit

## 2019-08-21 NOTE — Telephone Encounter (Signed)
Yes I do want him to f/u with Amy for CPAP/OSA.

## 2019-08-21 NOTE — Telephone Encounter (Signed)
Pt returning call.  Relayed Dr. Synthia Innocent message.  Verbalizes understanding.

## 2019-08-21 NOTE — Telephone Encounter (Signed)
Lvm asking pt to call back.  Need to relay Dr. G's message.  

## 2019-09-04 ENCOUNTER — Encounter: Payer: Self-pay | Admitting: Family Medicine

## 2019-09-04 ENCOUNTER — Ambulatory Visit (INDEPENDENT_AMBULATORY_CARE_PROVIDER_SITE_OTHER): Payer: Medicare Other | Admitting: Family Medicine

## 2019-09-04 ENCOUNTER — Other Ambulatory Visit: Payer: Self-pay

## 2019-09-04 VITALS — BP 160/65 | HR 64 | Temp 97.9°F | Wt 233.0 lb

## 2019-09-04 DIAGNOSIS — Z9989 Dependence on other enabling machines and devices: Secondary | ICD-10-CM | POA: Diagnosis not present

## 2019-09-04 DIAGNOSIS — G4733 Obstructive sleep apnea (adult) (pediatric): Secondary | ICD-10-CM | POA: Diagnosis not present

## 2019-09-04 NOTE — Progress Notes (Addendum)
PATIENT: [name] DOB: 10-Aug-1931  REASON FOR VISIT: follow up HISTORY FROM: patient  Chief Complaint  Patient presents with  . OSA on CPAP    rm 6, one year FU "no problems with CPAP"     HISTORY OF PRESENT ILLNESS: Today 09/04/19 [name] is a 86 y.o. male here today for follow up for OSA on CPAP.  He is doing very well today and without complaints.  He continues CPAP therapy every night.  He states that he cannot sleep without it.  He is followed closely by PCP.  Blood pressure is 160/65 today.  He reports that at his CPE 3 weeks ago it was normal.  He does not routinely check at home.  He is asymptomatic today.  He does take metoprolol as prescribed.  Compliance report dated 08/04/2019 through 09/02/2019 reveals that he has used CPAP therapy every night.  He has used CPAP greater than 4 hours every night.  100% compliance on close.  Average usage is 8 hours and 33 minutes.  Residual AHI is 2.3 on 10 cm of water and EPR of 1.  There is no significant leak noted.  HISTORY: (copied from [place] note on 08/29/2017)  [name] is an 86 year old male with a history of obstructive sleep apnea on CPAP.  His download indicates that he use his machine nightly for compliance of 100%.  Every night he uses machine greater than 4 hours.  On average he uses his machine 8 hours and 56 minutes.  His residual AHI is 2.6 on 10 cm of water with EPR of 1.  He does not have a significant leak.  He states that since he is been on 10 cm of water he feels that he wakes up more.  He also states that his wife has noted that he is snoring.  He denies any significant daytime sleepiness.  He returns today for an evaluation.  HISTORY 01/23/2017(Copied from Dr. Guadelupe Sabin note):  I reviewed his CPAP compliance data from 12/23/2016 through 01/21/2017 which is a total of 30 days, during which time he used his machine 29 days with percent used days greater than 4 hours at 97%, indicating excellent  compliance with an average usage of 9 hours and 35 minutes, residual AHI at goal at 2.2 per hour, leak acceptable with the 95th percentile at 16.1 L/m on a pressure of 10 cm with EPR. He reports, he occasional wakes up with a dull headache. He limits his water intake after 8 PM. He does like to drink wine, wife gives him a hard time if he drinks more than 3 glasses. He is using a medium Eson nasal mask.Has occasional leg cramps, but tonic water helps, no actual RLS symptoms.  The patient's allergies, current medications, family history, past medical history, past social history, past surgical history and problem list were reviewed and updated as appropriate.    REVIEW OF SYSTEMS: Out of a complete 14 system review of symptoms, the patient complains only of the following symptoms, none and all other reviewed systems are negative.  ESS: 9  ALLERGIES: Allergies  Allergen Reactions  . Lisinopril Cough    HOME MEDICATIONS: Outpatient Medications Prior to Visit  Medication Sig Dispense Refill  . allopurinol (ZYLOPRIM) 100 MG tablet Take 2 tablets (200 mg total) by mouth daily. 180 tablet 3  . aspirin EC 81 MG tablet Take 81 mg by mouth daily.    . carboxymethylcellulose (REFRESH TEARS) 0.5 % SOLN Place  1 drop into both eyes daily as needed (dry eyes).    . cetirizine (ZYRTEC) 10 MG tablet Take 10 mg by mouth daily. Alternates weekly with allegra.    . fexofenadine (ALLEGRA) 180 MG tablet Take 180 mg by mouth daily. Alternates weekly with zyrtec.    Marland Kitchen GLUCOSAMINE-CHONDROITIN PO Take 2 tablets by mouth daily.    . hydrochlorothiazide (MICROZIDE) 12.5 MG capsule TAKE 1 CAPSULE(12.5 MG) BY MOUTH DAILY (Patient taking differently: Take 12.5 mg by mouth daily. ) 90 capsule 3  . metoprolol succinate (TOPROL-XL) 25 MG 24 hr tablet TAKE 1 TABLET BY MOUTH DAILY. TAKE WITH OR IMMEDIATELY FOLLOWING A MEAL (Patient taking differently: Take 25 mg by mouth daily. TAKE 1 TABLET BY MOUTH DAILY. TAKE WITH OR  IMMEDIATELY FOLLOWING A MEAL) 90 tablet 3  . Multiple Vitamins-Minerals (MULTIVITAMIN ADULT PO) Take 1 tablet by mouth daily.    . naproxen sodium (ALEVE) 220 MG tablet Take 440 mg by mouth daily.    Marland Kitchen omeprazole (PRILOSEC) 20 MG capsule Take 20 mg by mouth daily.    . polyethylene glycol (MIRALAX / GLYCOLAX) packet Take 8.5 g by mouth daily.     . Potassium 99 MG TABS Take 99 mg by mouth daily.     No facility-administered medications prior to visit.    PAST MEDICAL HISTORY: Past Medical History:  Diagnosis Date  . Actinic keratosis   . Carotid stenosis, asymptomatic, bilateral 09/16/2016   Diffuse 1-39% bilaterally, but involving ICA, ECA, CCA - rpt 1 yr (09/2016)  . Complication of anesthesia    slow to wake up after a colonoscopy years ago  . Diverticulitis   . GERD (gastroesophageal reflux disease)   . Gout   . History of osteomyelitis 10/2015   left foot  . History of squamous cell carcinoma of skin   . HTN (hypertension)   . OSA on CPAP 09/01/2016  . Osteoarthritis    back, neck, knees - multiple joints  . Personal history of malignant neoplasm of larynx 2009   s/p XRT, Skin Cancer- "pre cancerous"  . Pre-diabetes   . Seasonal allergies   . Sensorineural hearing loss (SNHL) of both ears 09/01/2016  . Squamous cell carcinoma of skin unknown   Treated in [place] of the L post auricular     PAST SURGICAL HISTORY: Past Surgical History:  Procedure Laterality Date  . ACHILLES TENDON REPAIR Left   . AMPUTATION Left 05/16/2019   LEFT GREAT TOE AMPUTATION for osteomyelitis Sharol Given, Illene Regulus, MD), 2nd toe removed  . APPENDECTOMY    . COLONOSCOPY    . THROAT SURGERY N/A 2009   ?vocal cord vs larynx nodule removal, had XRT after this but states it was not cancer  . TONSILLECTOMY    . TREATMENT FISTULA ANAL      FAMILY HISTORY: Family History  Problem Relation Age of Onset  . Hypertension Mother   . Hypertension Father   . Cancer Other        unknown - niece     SOCIAL HISTORY: Social History   Socioeconomic History  . Marital status: Married    Spouse name: Not on file  . Number of children: Not on file  . Years of education: Not on file  . Highest education level: Bachelor's degree (e.g., BA, AB, BS)  Occupational History  . Not on file  Tobacco Use  . Smoking status: Never Smoker  . Smokeless tobacco: Never Used  Substance and Sexual Activity  .  Alcohol use: Yes    Alcohol/week: 4.0 standard drinks    Types: 4 Glasses of wine per week  . Drug use: No  . Sexual activity: Not on file  Other Topics Concern  . Not on file  Social History Narrative   Widower. 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 Difficulty of Paying Living Expenses:   Food Insecurity:   . Worried About Charity fundraiser in the Last Year:   . Arboriculturist in the Last Year:   Transportation Needs:   . Film/video editor (Medical):   Marland Kitchen Lack of Transportation (Non-Medical):   Physical Activity:   . Days of Exercise per Week:   . Minutes of Exercise per Session:   Stress:   . Feeling of Stress :   Social Connections:   . Frequency of Communication with Friends and Family:   . Frequency of Social Gatherings with Friends and Family:   . Attends Religious Services:   . Active Member of Clubs or Organizations:   . Attends Archivist Meetings:   Marland Kitchen Marital Status:   Intimate Partner Violence:   . Fear of Current or Ex-Partner:   . Emotionally Abused:   Marland Kitchen Physically Abused:   . Sexually Abused:       PHYSICAL EXAM  Vitals:   09/04/19 1245  BP: (!) 160/65  Pulse: 64  Temp: 97.9 F (36.6 C)  Weight: 233 lb (105.7 kg)   Body mass index is 34.66 kg/m.  Generalized: Well developed, in no acute distress  Cardiology:  normal rate and rhythm, no murmur noted Respiratory: clear to auscultation bilaterally  Neurological examination  Mentation: Alert oriented to time, place, history taking. Follows all commands speech and language fluent Cranial nerve II-XII: Pupils were equal round reactive to light. Extraocular movements were full, visual field were full  Motor: The motor testing reveals 5 over 5 strength of all 4 extremities. Good symmetric motor tone is noted throughout.  Gait and station: Gait is normal.    DIAGNOSTIC DATA (LABS, IMAGING, TESTING) - I reviewed patient records, labs, notes, testing and imaging myself where available.  MMSE - Mini Mental State Exam 08/28/2017 08/23/2016  Orientation to time 5 5  Orientation to Place 5 5  Registration 3 3  Attention/ Calculation 0 0  Recall 2 3  Recall-comments unable to recall 1 of 3 words -  Language- name 2 objects 0 0  Language- repeat 1 1  Language- follow 3 step command 3 3  Language- read & follow direction 0 0  Write a sentence 0 0  Copy design 0 0  Total score 19 20     Lab Results  Component Value Date   WBC 5.0 05/16/2019   HGB 13.4 05/16/2019   HCT 40.6 05/16/2019   MCV 98.5 05/16/2019   PLT 187 05/16/2019      Component Value Date/Time   NA 137 05/16/2019 0703   K 4.1 05/16/2019 0703   CL 103 05/16/2019 0703   CO2 22 05/16/2019 0703   GLUCOSE 109 (H) 05/16/2019 0703   BUN 25 (H) 05/16/2019 0703   CREATININE 1.10 05/16/2019 0703   CALCIUM 8.7 (L) 05/16/2019 0703   PROT 6.5 02/05/2019 [id number]  ALBUMIN 4.0 02/05/2019 0752   AST 23 02/05/2019 0752   ALT 23 02/05/2019 0752   ALKPHOS 53 02/05/2019 0752   BILITOT 0.4 02/05/2019 0752   GFRNONAA >60 05/16/2019 0703   GFRAA >60 05/16/2019 0703   Lab Results  Component Value Date   CHOL 131 02/05/2019   HDL 32.10 (L) 02/05/2019   LDLCALC 71 08/27/2017   LDLDIRECT 76.0 02/05/2019   TRIG 251.0 (H) 02/05/2019   CHOLHDL 4 02/05/2019   Lab Results  Component Value Date    HGBA1C 5.6 02/12/2019   No results found for: VITAMINB12 Lab Results  Component Value Date   TSH 3.43 02/05/2019       ASSESSMENT AND PLAN 86 y.o. year old male  has a past medical history of Actinic keratosis, Carotid stenosis, asymptomatic, bilateral ([id number]), Complication of anesthesia, Diverticulitis, GERD (gastroesophageal reflux disease), Gout, History of osteomyelitis (10/2015), History of squamous cell carcinoma of skin, HTN (hypertension), OSA on CPAP (09/01/2016), Osteoarthritis, Personal history of malignant neoplasm of larynx (2009), Pre-diabetes, Seasonal allergies, Sensorineural hearing loss (SNHL) of both ears (09/01/2016), and Squamous cell carcinoma of skin (unknown). here with     ICD-10-CM   1. OSA on CPAP  G47.33 For home use only DME continuous positive airway pressure (CPAP)   Z99.89     [name] reports doing very well. He is using CPAP nightly. Compliance report reveals excellent compliance.  He was encouraged to continue using CPAP nightly and for greater than 4 hours each night.  He is followed closely by PCP. BP was normal with recent CPE three weeks ago. He does not normally check at home. He was advised to continue close follow up with PCP and keep an eye on BP.  Adequate hydration, well-balanced diet and regular exercise advised.  We will send updated orders for supplies to [facility].  He will follow-up with me in 1 year, sooner if needed.  He verbalizes understanding and agreement with this plan.   Orders Placed This Encounter  Procedures  . For home use only DME continuous positive airway pressure (CPAP)    Supplies    Order Specific Question:   Length of Need    Answer:   Lifetime    Order Specific Question:   Patient has OSA or probable OSA    Answer:   Yes    Order Specific Question:   Is the patient currently using CPAP in the home    Answer:   Yes    Order Specific Question:   Settings    Answer:   Other see comments    Order Specific Question:    CPAP supplies needed    Answer:   Mask, headgear, cushions, filters, heated tubing and water chamber     No orders of the defined types were placed in this encounter.     I spent 15 minutes with the patient. 50% of this time was spent counseling and educating patient on plan of care and medications.    Debbora Presto, FNP-C 09/04/2019, 1:06 PM [facility] [address] [place], [place] [address] [phone]  I reviewed the above note and documentation by the Nurse Practitioner and agree with the history, exam, assessment and plan as outlined above. I was available for consultation. Star Age, MD, PhD [facility])

## 2019-09-04 NOTE — Patient Instructions (Signed)
Please continue using your CPAP regularly. While your insurance requires that you use CPAP at least 4 hours each night on 70% of the nights, I recommend, that you not skip any nights and use it throughout the night if you can. Getting used to CPAP and staying with the treatment long term does take time and patience and discipline. Untreated obstructive sleep apnea when it is moderate to severe can have an adverse impact on cardiovascular health and raise her risk for heart disease, arrhythmias, hypertension, congestive heart failure, stroke and diabetes. Untreated obstructive sleep apnea causes sleep disruption, nonrestorative sleep, and sleep deprivation. This can have an impact on your day to day functioning and cause daytime sleepiness and impairment of cognitive function, memory loss, mood disturbance, and problems focussing. Using CPAP regularly can improve these symptoms.   Follow up in 1 year  Hypertension, Adult Hypertension is another name for high blood pressure. High blood pressure forces your heart to work harder to pump blood. This can cause problems over time. There are two numbers in a blood pressure reading. There is a top number (systolic) over a bottom number (diastolic). It is best to have a blood pressure that is below 120/80. Healthy choices can help lower your blood pressure, or you may need medicine to help lower it. What are the causes? The cause of this condition is not known. Some conditions may be related to high blood pressure. What increases the risk?  Smoking.  Having type 2 diabetes mellitus, high cholesterol, or both.  Not getting enough exercise or physical activity.  Being overweight.  Having too much fat, sugar, calories, or salt (sodium) in your diet.  Drinking too much alcohol.  Having long-term (chronic) kidney disease.  Having a family history of high blood pressure.  Age. Risk increases with age.  Race. You may be at higher risk if you are  African American.  Gender. Men are at higher risk than women before age 25. After age 58, women are at higher risk than men.  Having obstructive sleep apnea.  Stress. What are the signs or symptoms?  High blood pressure may not cause symptoms. Very high blood pressure (hypertensive crisis) may cause: ? Headache. ? Feelings of worry or nervousness (anxiety). ? Shortness of breath. ? Nosebleed. ? A feeling of being sick to your stomach (nausea). ? Throwing up (vomiting). ? Changes in how you see. ? Very bad chest pain. ? Seizures. How is this treated?  This condition is treated by making healthy lifestyle changes, such as: ? Eating healthy foods. ? Exercising more. ? Drinking less alcohol.  Your health care provider may prescribe medicine if lifestyle changes are not enough to get your blood pressure under control, and if: ? Your top number is above 130. ? Your bottom number is above 80.  Your personal target blood pressure may vary. Follow these instructions at home: Eating and drinking   If told, follow the DASH eating plan. To follow this plan: ? Fill one half of your plate at each meal with fruits and vegetables. ? Fill one fourth of your plate at each meal with whole grains. Whole grains include whole-wheat pasta, brown rice, and whole-grain bread. ? Eat or drink low-fat dairy products, such as skim milk or low-fat yogurt. ? Fill one fourth of your plate at each meal with low-fat (lean) proteins. Low-fat proteins include fish, chicken without skin, eggs, beans, and tofu. ? Avoid fatty meat, cured and processed meat, or chicken with  skin. ? Avoid pre-made or processed food.  Eat less than 1,500 mg of salt each day.  Do not drink alcohol if: ? Your doctor tells you not to drink. ? You are pregnant, may be pregnant, or are planning to become pregnant.  If you drink alcohol: ? Limit how much you use to:  0-1 drink a day for women.  0-2 drinks a day for men. ? Be  aware of how much alcohol is in your drink. In the [place] one drink equals one 12 oz bottle of beer (355 mL), one 5 oz glass of wine (148 mL), or one 1 oz glass of hard liquor (44 mL). Lifestyle   Work with your doctor to stay at a healthy weight or to lose weight. Ask your doctor what the best weight is for you.  Get at least 30 minutes of exercise most days of the week. This may include walking, swimming, or biking.  Get at least 30 minutes of exercise that strengthens your muscles (resistance exercise) at least 3 days a week. This may include lifting weights or doing Pilates.  Do not use any products that contain nicotine or tobacco, such as cigarettes, e-cigarettes, and chewing tobacco. If you need help quitting, ask your doctor.  Check your blood pressure at home as told by your doctor.  Keep all follow-up visits as told by your doctor. This is important. Medicines  Take over-the-counter and prescription medicines only as told by your doctor. Follow directions carefully.  Do not skip doses of blood pressure medicine. The medicine does not work as well if you skip doses. Skipping doses also puts you at risk for problems.  Ask your doctor about side effects or reactions to medicines that you should watch for. Contact a doctor if you:  Think you are having a reaction to the medicine you are taking.  Have headaches that keep coming back (recurring).  Feel dizzy.  Have swelling in your ankles.  Have trouble with your vision. Get help right away if you:  Get a very bad headache.  Start to feel mixed up (confused).  Feel weak or numb.  Feel faint.  Have very bad pain in your: ? Chest. ? Belly (abdomen).  Throw up more than once.  Have trouble breathing. Summary  Hypertension is another name for high blood pressure.  High blood pressure forces your heart to work harder to pump blood.  For most people, a normal blood pressure is less than 120/80.  Making healthy  choices can help lower blood pressure. If your blood pressure does not get lower with healthy choices, you may need to take medicine. This information is not intended to replace advice given to you by your health care provider. Make sure you discuss any questions you have with your health care provider. Document Revised: 01/02/2018 Document Reviewed: 01/02/2018 Elsevier Patient Education  [place].   Sleep Apnea Sleep apnea affects breathing during sleep. It causes breathing to stop for a short time or to become shallow. It can also increase the risk of:  Heart attack.  Stroke.  Being very overweight (obese).  Diabetes.  Heart failure.  Irregular heartbeat. The goal of treatment is to help you breathe normally again. What are the causes? There are three kinds of sleep apnea:  Obstructive sleep apnea. This is caused by a blocked or collapsed airway.  Central sleep apnea. This happens when the brain does not send the right signals to the muscles that control breathing.  Mixed sleep apnea. This is a combination of obstructive and central sleep apnea. The most common cause of this condition is a collapsed or blocked airway. This can happen if:  Your throat muscles are too relaxed.  Your tongue and tonsils are too large.  You are overweight.  Your airway is too small. What increases the risk?  Being overweight.  Smoking.  Having a small airway.  Being older.  Being male.  Drinking alcohol.  Taking medicines to calm yourself (sedatives or tranquilizers).  Having family members with the condition. What are the signs or symptoms?  Trouble staying asleep.  Being sleepy or tired during the day.  Getting angry a lot.  Loud snoring.  Headaches in the morning.  Not being able to focus your mind (concentrate).  Forgetting things.  Less interest in sex.  Mood swings.  Personality changes.  Feelings of sadness (depression).  Waking up a lot  during the night to pee (urinate).  Dry mouth.  Sore throat. How is this diagnosed?  Your medical history.  A physical exam.  A test that is done when you are sleeping (sleep study). The test is most often done in a sleep lab but may also be done at home. How is this treated?   Sleeping on your side.  Using a medicine to get rid of mucus in your nose (decongestant).  Avoiding the use of alcohol, medicines to help you relax, or certain pain medicines (narcotics).  Losing weight, if needed.  Changing your diet.  Not smoking.  Using a machine to open your airway while you sleep, such as: ? An oral appliance. This is a mouthpiece that shifts your lower jaw forward. ? A CPAP device. This device blows air through a mask when you breathe out (exhale). ? An EPAP device. This has valves that you put in each nostril. ? A BPAP device. This device blows air through a mask when you breathe in (inhale) and breathe out.  Having surgery if other treatments do not work. It is important to get treatment for sleep apnea. Without treatment, it can lead to:  High blood pressure.  Coronary artery disease.  In men, not being able to have an erection (impotence).  Reduced thinking ability. Follow these instructions at home: Lifestyle  Make changes that your doctor recommends.  Eat a healthy diet.  Lose weight if needed.  Avoid alcohol, medicines to help you relax, and some pain medicines.  Do not use any products that contain nicotine or tobacco, such as cigarettes, e-cigarettes, and chewing tobacco. If you need help quitting, ask your doctor. General instructions  Take over-the-counter and prescription medicines only as told by your doctor.  If you were given a machine to use while you sleep, use it only as told by your doctor.  If you are having surgery, make sure to tell your doctor you have sleep apnea. You may need to bring your device with you.  Keep all follow-up visits  as told by your doctor. This is important. Contact a doctor if:  The machine that you were given to use during sleep bothers you or does not seem to be working.  You do not get better.  You get worse. Get help right away if:  Your chest hurts.  You have trouble breathing in enough air.  You have an uncomfortable feeling in your back, arms, or stomach.  You have trouble talking.  One side of your body feels weak.  A part of your  face is hanging down. These symptoms may be an emergency. Do not wait to see if the symptoms will go away. Get medical help right away. Call your local emergency services (911 in the [place] Do not drive yourself to the hospital. Summary  This condition affects breathing during sleep.  The most common cause is a collapsed or blocked airway.  The goal of treatment is to help you breathe normally while you sleep. This information is not intended to replace advice given to you by your health care provider. Make sure you discuss any questions you have with your health care provider. Document Revised: 02/08/2018 Document Reviewed: 12/18/2017 Elsevier Patient Education  [place].

## 2019-09-04 NOTE — Progress Notes (Signed)
Order for cpap supplies sent to [facility] via community message. Confirmation received that the order transmitted was successful.

## 2019-09-09 DIAGNOSIS — M955 Acquired deformity of pelvis: Secondary | ICD-10-CM | POA: Diagnosis not present

## 2019-09-09 DIAGNOSIS — M9903 Segmental and somatic dysfunction of lumbar region: Secondary | ICD-10-CM | POA: Diagnosis not present

## 2019-09-09 DIAGNOSIS — M9905 Segmental and somatic dysfunction of pelvic region: Secondary | ICD-10-CM | POA: Diagnosis not present

## 2019-09-09 DIAGNOSIS — M5416 Radiculopathy, lumbar region: Secondary | ICD-10-CM | POA: Diagnosis not present

## 2019-09-29 DIAGNOSIS — M9905 Segmental and somatic dysfunction of pelvic region: Secondary | ICD-10-CM | POA: Diagnosis not present

## 2019-09-29 DIAGNOSIS — M955 Acquired deformity of pelvis: Secondary | ICD-10-CM | POA: Diagnosis not present

## 2019-09-29 DIAGNOSIS — M5416 Radiculopathy, lumbar region: Secondary | ICD-10-CM | POA: Diagnosis not present

## 2019-09-29 DIAGNOSIS — M9903 Segmental and somatic dysfunction of lumbar region: Secondary | ICD-10-CM | POA: Diagnosis not present

## 2019-11-18 DIAGNOSIS — M955 Acquired deformity of pelvis: Secondary | ICD-10-CM | POA: Diagnosis not present

## 2019-11-18 DIAGNOSIS — M9903 Segmental and somatic dysfunction of lumbar region: Secondary | ICD-10-CM | POA: Diagnosis not present

## 2019-11-18 DIAGNOSIS — M9905 Segmental and somatic dysfunction of pelvic region: Secondary | ICD-10-CM | POA: Diagnosis not present

## 2019-11-18 DIAGNOSIS — M5416 Radiculopathy, lumbar region: Secondary | ICD-10-CM | POA: Diagnosis not present

## 2019-12-09 DIAGNOSIS — M9903 Segmental and somatic dysfunction of lumbar region: Secondary | ICD-10-CM | POA: Diagnosis not present

## 2019-12-09 DIAGNOSIS — M5416 Radiculopathy, lumbar region: Secondary | ICD-10-CM | POA: Diagnosis not present

## 2019-12-09 DIAGNOSIS — M955 Acquired deformity of pelvis: Secondary | ICD-10-CM | POA: Diagnosis not present

## 2019-12-09 DIAGNOSIS — M9905 Segmental and somatic dysfunction of pelvic region: Secondary | ICD-10-CM | POA: Diagnosis not present

## 2019-12-30 DIAGNOSIS — M9905 Segmental and somatic dysfunction of pelvic region: Secondary | ICD-10-CM | POA: Diagnosis not present

## 2019-12-30 DIAGNOSIS — M9903 Segmental and somatic dysfunction of lumbar region: Secondary | ICD-10-CM | POA: Diagnosis not present

## 2019-12-30 DIAGNOSIS — M955 Acquired deformity of pelvis: Secondary | ICD-10-CM | POA: Diagnosis not present

## 2019-12-30 DIAGNOSIS — M5416 Radiculopathy, lumbar region: Secondary | ICD-10-CM | POA: Diagnosis not present

## 2020-01-15 DIAGNOSIS — H353131 Nonexudative age-related macular degeneration, bilateral, early dry stage: Secondary | ICD-10-CM | POA: Diagnosis not present

## 2020-01-21 DIAGNOSIS — M5416 Radiculopathy, lumbar region: Secondary | ICD-10-CM | POA: Diagnosis not present

## 2020-01-21 DIAGNOSIS — M9903 Segmental and somatic dysfunction of lumbar region: Secondary | ICD-10-CM | POA: Diagnosis not present

## 2020-01-21 DIAGNOSIS — M955 Acquired deformity of pelvis: Secondary | ICD-10-CM | POA: Diagnosis not present

## 2020-01-21 DIAGNOSIS — M9905 Segmental and somatic dysfunction of pelvic region: Secondary | ICD-10-CM | POA: Diagnosis not present

## 2020-01-26 ENCOUNTER — Other Ambulatory Visit: Payer: Self-pay | Admitting: Family Medicine

## 2020-02-06 DIAGNOSIS — Z23 Encounter for immunization: Secondary | ICD-10-CM | POA: Diagnosis not present

## 2020-02-08 ENCOUNTER — Other Ambulatory Visit: Payer: Self-pay | Admitting: Family Medicine

## 2020-02-08 DIAGNOSIS — I10 Essential (primary) hypertension: Secondary | ICD-10-CM

## 2020-02-08 DIAGNOSIS — M1A00X0 Idiopathic chronic gout, unspecified site, without tophus (tophi): Secondary | ICD-10-CM

## 2020-02-08 DIAGNOSIS — E785 Hyperlipidemia, unspecified: Secondary | ICD-10-CM

## 2020-02-10 DIAGNOSIS — M9903 Segmental and somatic dysfunction of lumbar region: Secondary | ICD-10-CM | POA: Diagnosis not present

## 2020-02-10 DIAGNOSIS — M955 Acquired deformity of pelvis: Secondary | ICD-10-CM | POA: Diagnosis not present

## 2020-02-10 DIAGNOSIS — M5416 Radiculopathy, lumbar region: Secondary | ICD-10-CM | POA: Diagnosis not present

## 2020-02-10 DIAGNOSIS — M9905 Segmental and somatic dysfunction of pelvic region: Secondary | ICD-10-CM | POA: Diagnosis not present

## 2020-02-11 ENCOUNTER — Other Ambulatory Visit: Payer: Self-pay

## 2020-02-11 ENCOUNTER — Ambulatory Visit (INDEPENDENT_AMBULATORY_CARE_PROVIDER_SITE_OTHER): Payer: Medicare Other

## 2020-02-11 ENCOUNTER — Other Ambulatory Visit (INDEPENDENT_AMBULATORY_CARE_PROVIDER_SITE_OTHER): Payer: Medicare Other

## 2020-02-11 ENCOUNTER — Ambulatory Visit: Payer: Medicare Other

## 2020-02-11 DIAGNOSIS — I10 Essential (primary) hypertension: Secondary | ICD-10-CM

## 2020-02-11 DIAGNOSIS — M1A00X0 Idiopathic chronic gout, unspecified site, without tophus (tophi): Secondary | ICD-10-CM

## 2020-02-11 DIAGNOSIS — Z0000 Encounter for general adult medical examination without abnormal findings: Secondary | ICD-10-CM

## 2020-02-11 DIAGNOSIS — E785 Hyperlipidemia, unspecified: Secondary | ICD-10-CM | POA: Diagnosis not present

## 2020-02-11 LAB — COMPREHENSIVE METABOLIC PANEL
ALT: 29 U/L (ref 0–53)
AST: 29 U/L (ref 0–37)
Albumin: 4 g/dL (ref 3.5–5.2)
Alkaline Phosphatase: 51 U/L (ref 39–117)
BUN: 23 mg/dL (ref 6–23)
CO2: 30 mEq/L (ref 19–32)
Calcium: 9.1 mg/dL (ref 8.4–10.5)
Chloride: 101 mEq/L (ref 96–112)
Creatinine, Ser: 1.23 mg/dL (ref 0.40–1.50)
GFR: 52.77 mL/min — ABNORMAL LOW (ref >60.00)
Glucose, Bld: 99 mg/dL (ref 70–99)
Potassium: 4.3 mEq/L (ref 3.5–5.1)
Sodium: 138 mEq/L (ref 135–145)
Total Bilirubin: 0.6 mg/dL (ref 0.2–1.2)
Total Protein: 6.9 g/dL (ref 6.0–8.3)

## 2020-02-11 LAB — LIPID PANEL
Cholesterol: 130 mg/dL (ref 0–200)
HDL: 32 mg/dL — ABNORMAL LOW (ref >39.00)
LDL Cholesterol: 67 mg/dL (ref 0–99)
NonHDL: 98.02
Total CHOL/HDL Ratio: 4
Triglycerides: 157 mg/dL — ABNORMAL HIGH (ref 0.0–149.0)
VLDL: 31.4 mg/dL (ref 0.0–40.0)

## 2020-02-11 LAB — MICROALBUMIN / CREATININE URINE RATIO
Creatinine,U: 152.4 mg/dL
Microalb Creat Ratio: 1.7 mg/g (ref 0.0–30.0)
Microalb, Ur: 2.6 mg/dL — ABNORMAL HIGH (ref 0.0–1.9)

## 2020-02-11 LAB — URIC ACID: Uric Acid, Serum: 5.4 mg/dL (ref 4.0–7.8)

## 2020-02-11 NOTE — Progress Notes (Signed)
Subjective:   [name] is a 86 y.o. male who presents for Medicare Annual/Subsequent preventive examination.  Review of Systems: N/A      I connected with the patient today by telephone and verified that I am speaking with the correct person using two identifiers. Location patient: home Location nurse: work Persons participating in the telephone visit: patient, nurse.   I discussed the limitations, risks, security and privacy concerns of performing an evaluation and management service by telephone and the availability of in person appointments. I also discussed with the patient that there may be a patient responsible charge related to this service. The patient expressed understanding and verbally consented to this telephonic visit.        Cardiac Risk Factors include: advanced age (>43men, >32 women);male gender;Other (see comment), Risk factor comments: hyperlipidemia     Objective:    Today's Vitals   There is no height or weight on file to calculate BMI.  Advanced Directives 02/11/2020 05/16/2019 08/28/2017 08/23/2016  Does Patient Have a Medical Advance Directive? No No No Yes  Type of Advance Directive - - Public librarian;Living will  Copy of Watchtower in Chart? - - - No - copy requested  Would patient like information on creating a medical advance directive? No - Patient declined - No - Patient declined -    Current Medications (verified) Outpatient Encounter Medications as of 02/11/2020  Medication Sig  . allopurinol (ZYLOPRIM) 100 MG tablet TAKE 2 TABLETS(200 MG) BY MOUTH DAILY  . aspirin EC 81 MG tablet Take 81 mg by mouth daily.  . carboxymethylcellulose (REFRESH TEARS) 0.5 % SOLN Place 1 drop into both eyes daily as needed (dry eyes).  . cetirizine (ZYRTEC) 10 MG tablet Take 10 mg by mouth daily. Alternates weekly with allegra.  . fexofenadine (ALLEGRA) 180 MG tablet Take 180 mg by mouth daily. Alternates weekly with zyrtec.  Marland Kitchen  GLUCOSAMINE-CHONDROITIN PO Take 2 tablets by mouth daily.  . hydrochlorothiazide (MICROZIDE) 12.5 MG capsule TAKE 1 CAPSULE(12.5 MG) BY MOUTH DAILY  . metoprolol succinate (TOPROL-XL) 25 MG 24 hr tablet TAKE 1 TABLET BY MOUTH DAILY. TAKE WITH OR IMMEDIATELY FOLLOWING A MEAL (Patient taking differently: Take 25 mg by mouth daily. TAKE 1 TABLET BY MOUTH DAILY. TAKE WITH OR IMMEDIATELY FOLLOWING A MEAL)  . Multiple Vitamins-Minerals (MULTIVITAMIN ADULT PO) Take 1 tablet by mouth daily.  . naproxen sodium (ALEVE) 220 MG tablet Take 440 mg by mouth daily.  Marland Kitchen omeprazole (PRILOSEC) 20 MG capsule Take 20 mg by mouth daily.  . polyethylene glycol (MIRALAX / GLYCOLAX) packet Take 8.5 g by mouth daily.   . Potassium 99 MG TABS Take 99 mg by mouth daily.   No facility-administered encounter medications on file as of 02/11/2020.    Allergies (verified) Lisinopril   History: Past Medical History:  Diagnosis Date  . Actinic keratosis   . Carotid stenosis, asymptomatic, bilateral 09/16/2016   Diffuse 1-39% bilaterally, but involving ICA, ECA, CCA - rpt 1 yr (09/2016)  . Complication of anesthesia    slow to wake up after a colonoscopy years ago  . Diverticulitis   . GERD (gastroesophageal reflux disease)   . Gout   . History of osteomyelitis 10/2015   left foot  . History of squamous cell carcinoma of skin   . HTN (hypertension)   . OSA on CPAP 09/01/2016  . Osteoarthritis    back, neck, knees - multiple joints  . Personal history of  malignant neoplasm of larynx 2009   s/p XRT, Skin Cancer- "pre cancerous"  . Pre-diabetes   . Seasonal allergies   . Sensorineural hearing loss (SNHL) of both ears 09/01/2016  . Squamous cell carcinoma of skin unknown   Treated in [place] of the [facility] post auricular    Past Surgical History:  Procedure Laterality Date  . ACHILLES TENDON REPAIR Left   . AMPUTATION Left 05/16/2019   LEFT GREAT TOE AMPUTATION for osteomyelitis Sharol Given, Illene Regulus, MD), 2nd toe  removed  . APPENDECTOMY    . COLONOSCOPY    . THROAT SURGERY N/A 2009   ?vocal cord vs larynx nodule removal, had XRT after this but states it was not cancer  . TONSILLECTOMY    . TREATMENT FISTULA ANAL     Family History  Problem Relation Age of Onset  . Hypertension Mother   . Hypertension Father   . Cancer Other        unknown - niece   Social History   Socioeconomic History  . Marital status: Married    Spouse name: Not on file  . Number of children: Not on file  . Years of education: Not on file  . Highest education level: Bachelor's degree (e.g., BA, AB, BS)  Occupational History  . Not on file  Tobacco Use  . Smoking status: Never Smoker  . Smokeless tobacco: Never Used  Vaping Use  . Vaping Use: Never used  Substance and Sexual Activity  . Alcohol use: Yes    Comment: occasional wine  . Drug use: No  . Sexual activity: Not on file  Other Topics Concern  . Not on file  Social History Narrative   Widower. Second marriage - step-father of Kathrynn Running   Occ: retired [employer]   Edu: college chemistry degree   Right handed   Caffeine: Drinks 1 cup of coffee every morning    Social Determinants of Radio broadcast assistant Strain: Low Risk   . Difficulty of Paying Living Expenses: Not hard at all  Food Insecurity: No Food Insecurity  . Worried About Charity fundraiser in the Last Year: Never true  . Ran Out of Food in the Last Year: Never true  Transportation Needs: No Transportation Needs  . Lack of Transportation (Medical): No  . Lack of Transportation (Non-Medical): No  Physical Activity: Sufficiently Active  . Days of Exercise per Week: 7 days  . Minutes of Exercise per Session: 30 min  Stress: No Stress Concern Present  . Feeling of Stress : Not at all  Social Connections:   . Frequency of Communication with Friends and Family: Not on file  . Frequency of Social Gatherings with Friends and Family: Not on file  . Attends  Religious Services: Not on file  . Active Member of Clubs or Organizations: Not on file  . Attends Archivist Meetings: Not on file  . Marital Status: Not on file    Tobacco Counseling Counseling given: Not Answered   Clinical Intake:  Pre-visit preparation completed: Yes  Pain : No/denies pain     Nutritional Risks: None Diabetes: No  How often do you need to have someone help you when you read instructions, pamphlets, or other written materials from your doctor or pharmacy?: 1 - Never What is the last grade level you completed in school?: college graduate  Diabetic: No Nutrition Risk Assessment:  Has the patient had any N/V/D within the last  2 months?  No  Does the patient have any non-healing wounds?  No  Has the patient had any unintentional weight loss or weight gain?  No   Diabetes:  Is the patient diabetic?  No  If diabetic, was a CBG obtained today?  N/A Did the patient bring in their glucometer from home?  N/A How often do you monitor your CBG's? N/A.   Financial Strains and Diabetes Management:  Are you having any financial strains with the device, your supplies or your medication? N/A.  Does the patient want to be seen by Chronic Care Management for management of their diabetes?  N/A Would the patient like to be referred to a Nutritionist or for Diabetic Management?  N/A     Interpreter Needed?: No  Information entered by :: CJohnson, LPN   Activities of Daily Living In your present state of health, do you have any difficulty performing the following activities: 02/11/2020 05/16/2019  Hearing? Y -  Comment wears hearing aids -  Vision? N -  Difficulty concentrating or making decisions? Y -  Comment some memory loss noted -  Walking or climbing stairs? N -  Dressing or bathing? N -  Doing errands, shopping? N N  Preparing Food and eating ? N -  Using the Toilet? N -  In the past six months, have you accidently leaked urine? N -  Do you  have problems with loss of bowel control? N -  Managing your Medications? N -  Managing your Finances? N -  Housekeeping or managing your Housekeeping? N -  Some recent data might be hidden    Patient Care Team: Ria Bush, MD as PCP - General (Family Medicine)  Indicate any recent Medical Services you may have received from other than [facility] providers in the past year (date may be approximate).     Assessment:   This is a routine wellness examination for [place].  Hearing/Vision screen  Hearing Screening   125Hz  250Hz  500Hz  1000Hz  2000Hz  3000Hz  4000Hz  6000Hz  8000Hz   Right ear:           Left ear:           Vision Screening Comments: Patient gets annual eye exams  Dietary issues and exercise activities discussed: Current Exercise Habits: Home exercise routine, Type of exercise: walking, Time (Minutes): 30, Frequency (Times/Week): 7, Weekly Exercise (Minutes/Week): 210, Intensity: Moderate, Exercise limited by: None identified  Goals    . DIET - INCREASE WATER INTAKE     Starting 08/28/2017, I will attempt to drink at least 6-8 glasses of water daily.     . Increase physical activity     When weather permits, I will resume walking at least 1 mile in morning.     . Patient Stated     02/11/2020, I will continue to walk daily for 30 minutes.       Depression Screen PHQ 2/9 Scores 02/11/2020 02/12/2019 08/28/2017 08/23/2016  PHQ - 2 Score 0 0 0 0  PHQ- 9 Score 0 - 0 -    Fall Risk Fall Risk  02/11/2020 02/12/2019 12/05/2018 08/28/2017 08/23/2016  Falls in the past year? 0 0 0 No No  Comment - - Emmi Telephone Survey: data to providers prior to load - -  Number falls in past yr: 0 - - - -  Injury with Fall? 0 - - - -  Risk for fall due to : Medication side effect - - - -  Follow up Falls prevention discussed;Falls  evaluation completed - - - -    Any stairs in or around the home? Yes  If so, are there any without handrails? No  Home free of loose throw rugs in walkways, pet  beds, electrical cords, etc? Yes  Adequate lighting in your home to reduce risk of falls? Yes   ASSISTIVE DEVICES UTILIZED TO PREVENT FALLS:  Life alert? No  Use of a cane, walker or w/c? No  Grab bars in the bathroom? No  Shower chair or bench in shower? No  Elevated toilet seat or a handicapped toilet? No   TIMED UP AND GO:  Was the test performed? N/A, telephonic visit .    Cognitive Function: MMSE - Mini Mental State Exam 02/11/2020 08/28/2017 08/23/2016  Orientation to time 5 5 5   Orientation to Place 5 5 5   Registration 3 3 3   Attention/ Calculation 5 0 0  Recall 1 2 3   Recall-comments - unable to recall 1 of 3 words -  Language- name 2 objects - 0 0  Language- repeat 1 1 1   Language- follow 3 step command - 3 3  Language- read & follow direction - 0 0  Write a sentence - 0 0  Copy design - 0 0  Total score - 19 20  Mini Cog  Mini-Cog screen was completed. Maximum score is 22. A value of 0 denotes this part of the MMSE was not completed or the patient failed this part of the Mini-Cog screening.       Immunizations Immunization History  Administered Date(s) Administered  . Hepatitis A 12/06/2012  . Influenza, High Dose Seasonal PF 02/11/2018, 01/16/2019  . Influenza-Unspecified 01/19/2016, 01/30/2017  . PFIZER SARS-COV-2 Vaccination 06/28/2019, 07/19/2019, 02/06/2020  . Pneumococcal Conjugate-13 07/21/2014  . Pneumococcal Polysaccharide-23 09/09/2010  . Td 08/06/2004  . Tdap 09/10/2012  . Zoster 02/06/2008    TDAP status: Up to date Flu Vaccine status: due, will get at upcoming physical  Pneumococcal vaccine status: Up to date Covid-19 vaccine status: Completed vaccines  Qualifies for Shingles Vaccine? Yes   Zostavax completed No   Shingrix Completed?: No.    Education has been provided regarding the importance of this vaccine. Patient has been advised to call insurance company to determine out of pocket expense if they have not yet received this vaccine.  Advised may also receive vaccine at local pharmacy or Health Dept. Verbalized acceptance and understanding.  Screening Tests Health Maintenance  Topic Date Due  . INFLUENZA VACCINE  12/07/2019  . TETANUS/TDAP  09/11/2022  . COVID-19 Vaccine  Completed  . PNA vac Low Risk Adult  Completed    Health Maintenance  Health Maintenance Due  Topic Date Due  . INFLUENZA VACCINE  12/07/2019    Colorectal cancer screening: No longer required.   Lung Cancer Screening: (Low Dose CT Chest recommended if Age 80-80 years, 30 pack-year currently smoking OR have quit w/in 15 years.) does not qualify.    Additional Screening:  Hepatitis C Screening: does not qualify; Completed N/A  Vision Screening: Recommended annual ophthalmology exams for early detection of glaucoma and other disorders of the eye. Is the patient up to date with their annual eye exam?  Yes  Who is the provider or what is the name of the office in which the patient attends annual eye exams? [facility], Dr. Wallace Going  If pt is not established with a provider, would they like to be referred to a provider to establish care? No .   Dental  Screening: Recommended annual dental exams for proper oral hygiene  Community Resource Referral / Chronic Care Management: CRR required this visit?  No   CCM required this visit?  No      Plan:     I have personally reviewed and noted the following in the patient's chart:   . Medical and social history . Use of alcohol, tobacco or illicit drugs  . Current medications and supplements . Functional ability and status . Nutritional status . Physical activity . Advanced directives . List of other physicians . Hospitalizations, surgeries, and ER visits in previous 12 months . Vitals . Screenings to include cognitive, depression, and falls . Referrals and appointments  In addition, I have reviewed and discussed with patient certain preventive protocols, quality metrics, and  best practice recommendations. A written personalized care plan for preventive services as well as general preventive health recommendations were provided to patient.   Due to this being a telephonic visit, the after visit summary with patients personalized plan was offered to patient via office or my-chart. Patient preferred to pick up at office at next visit or via mychart.   Andrez Grime, LPN   [id number]

## 2020-02-11 NOTE — Progress Notes (Signed)
PCP notes:  Health Maintenance: Flu- due   Abnormal Screenings: MMSE score 20   Patient concerns: Discuss memory issues   Nurse concerns: none   Next PCP appt.: 02/17/2020 @ 10:30 am

## 2020-02-11 NOTE — Patient Instructions (Signed)
[name] , Thank you for taking time to come for your Medicare Wellness Visit. I appreciate your ongoing commitment to your health goals. Please review the following plan we discussed and let me know if I can assist you in the future.   Screening recommendations/referrals: Colonoscopy: no longer required Recommended yearly ophthalmology/optometry visit for glaucoma screening and checkup Recommended yearly dental visit for hygiene and checkup  Vaccinations: Influenza vaccine: due, will get at upcoming physical Pneumococcal vaccine: Completed series Tdap vaccine: Up to date, completed 09/10/2012, due 09/2022 Shingles vaccine: due, check with your insurance regarding coverage   Covid-19: Completed series  Advanced directives: Advance directive discussed with you today. Even though you declined this today please call our office should you change your mind and we can give you the proper paperwork for you to fill out.  Conditions/risks identified: hyperlipidemia  Next appointment: Follow up in one year for your annual wellness visit.   Preventive Care 38 Years and Older, Male Preventive care refers to lifestyle choices and visits with your health care provider that can promote health and wellness. What does preventive care include?  A yearly physical exam. This is also called an annual well check.  Dental exams once or twice a year.  Routine eye exams. Ask your health care provider how often you should have your eyes checked.  Personal lifestyle choices, including:  Daily care of your teeth and gums.  Regular physical activity.  Eating a healthy diet.  Avoiding tobacco and drug use.  Limiting alcohol use.  Practicing safe sex.  Taking low doses of aspirin every day.  Taking vitamin and mineral supplements as recommended by your health care provider. What happens during an annual well check? The services and screenings done by your health care provider during your annual well  check will depend on your age, overall health, lifestyle risk factors, and family history of disease. Counseling  Your health care provider may ask you questions about your:  Alcohol use.  Tobacco use.  Drug use.  Emotional well-being.  Home and relationship well-being.  Sexual activity.  Eating habits.  History of falls.  Memory and ability to understand (cognition).  Work and work Statistician. Screening  You may have the following tests or measurements:  Height, weight, and BMI.  Blood pressure.  Lipid and cholesterol levels. These may be checked every 5 years, or more frequently if you are over 73 years old.  Skin check.  Lung cancer screening. You may have this screening every year starting at age 61 if you have a 30-pack-year history of smoking and currently smoke or have quit within the past 15 years.  Fecal occult blood test (FOBT) of the stool. You may have this test every year starting at age 24.  Flexible sigmoidoscopy or colonoscopy. You may have a sigmoidoscopy every 5 years or a colonoscopy every 10 years starting at age 55.  Prostate cancer screening. Recommendations will vary depending on your family history and other risks.  Hepatitis C blood test.  Hepatitis B blood test.  Sexually transmitted disease (STD) testing.  Diabetes screening. This is done by checking your blood sugar (glucose) after you have not eaten for a while (fasting). You may have this done every 1-3 years.  Abdominal aortic aneurysm (AAA) screening. You may need this if you are a current or former smoker.  Osteoporosis. You may be screened starting at age 53 if you are at high risk. Talk with your health care provider about your test results,  treatment options, and if necessary, the need for more tests. Vaccines  Your health care provider may recommend certain vaccines, such as:  Influenza vaccine. This is recommended every year.  Tetanus, diphtheria, and acellular  pertussis (Tdap, Td) vaccine. You may need a Td booster every 10 years.  Zoster vaccine. You may need this after age 46.  Pneumococcal 13-valent conjugate (PCV13) vaccine. One dose is recommended after age 85.  Pneumococcal polysaccharide (PPSV23) vaccine. One dose is recommended after age 66. Talk to your health care provider about which screenings and vaccines you need and how often you need them. This information is not intended to replace advice given to you by your health care provider. Make sure you discuss any questions you have with your health care provider. Document Released: 05/21/2015 Document Revised: 01/12/2016 Document Reviewed: 02/23/2015 Elsevier Interactive Patient Education  2017 [place] Prevention in the Home Falls can cause injuries. They can happen to people of all ages. There are many things you can do to make your home safe and to help prevent falls. What can I do on the outside of my home?  Regularly fix the edges of walkways and driveways and fix any cracks.  Remove anything that might make you trip as you walk through a door, such as a raised step or threshold.  Trim any bushes or trees on the path to your home.  Use bright outdoor lighting.  Clear any walking paths of anything that might make someone trip, such as rocks or tools.  Regularly check to see if handrails are loose or broken. Make sure that both sides of any steps have handrails.  Any raised decks and porches should have guardrails on the edges.  Have any leaves, snow, or ice cleared regularly.  Use sand or salt on walking paths during winter.  Clean up any spills in your garage right away. This includes oil or grease spills. What can I do in the bathroom?  Use night lights.  Install grab bars by the toilet and in the tub and shower. Do not use towel bars as grab bars.  Use non-skid mats or decals in the tub or shower.  If you need to sit down in the shower, use a plastic,  non-slip stool.  Keep the floor dry. Clean up any water that spills on the floor as soon as it happens.  Remove soap buildup in the tub or shower regularly.  Attach bath mats securely with double-sided non-slip rug tape.  Do not have throw rugs and other things on the floor that can make you trip. What can I do in the bedroom?  Use night lights.  Make sure that you have a light by your bed that is easy to reach.  Do not use any sheets or blankets that are too big for your bed. They should not hang down onto the floor.  Have a firm chair that has side arms. You can use this for support while you get dressed.  Do not have throw rugs and other things on the floor that can make you trip. What can I do in the kitchen?  Clean up any spills right away.  Avoid walking on wet floors.  Keep items that you use a lot in easy-to-reach places.  If you need to reach something above you, use a strong step stool that has a grab bar.  Keep electrical cords out of the way.  Do not use floor polish or wax that makes floors  slippery. If you must use wax, use non-skid floor wax.  Do not have throw rugs and other things on the floor that can make you trip. What can I do with my stairs?  Do not leave any items on the stairs.  Make sure that there are handrails on both sides of the stairs and use them. Fix handrails that are broken or loose. Make sure that handrails are as long as the stairways.  Check any carpeting to make sure that it is firmly attached to the stairs. Fix any carpet that is loose or worn.  Avoid having throw rugs at the top or bottom of the stairs. If you do have throw rugs, attach them to the floor with carpet tape.  Make sure that you have a light switch at the top of the stairs and the bottom of the stairs. If you do not have them, ask someone to add them for you. What else can I do to help prevent falls?  Wear shoes that:  Do not have high heels.  Have rubber  bottoms.  Are comfortable and fit you well.  Are closed at the toe. Do not wear sandals.  If you use a stepladder:  Make sure that it is fully opened. Do not climb a closed stepladder.  Make sure that both sides of the stepladder are locked into place.  Ask someone to hold it for you, if possible.  Clearly mark and make sure that you can see:  Any grab bars or handrails.  First and last steps.  Where the edge of each step is.  Use tools that help you move around (mobility aids) if they are needed. These include:  Canes.  Walkers.  Scooters.  Crutches.  Turn on the lights when you go into a dark area. Replace any light bulbs as soon as they burn out.  Set up your furniture so you have a clear path. Avoid moving your furniture around.  If any of your floors are uneven, fix them.  If there are any pets around you, be aware of where they are.  Review your medicines with your doctor. Some medicines can make you feel dizzy. This can increase your chance of falling. Ask your doctor what other things that you can do to help prevent falls. This information is not intended to replace advice given to you by your health care provider. Make sure you discuss any questions you have with your health care provider. Document Released: 02/18/2009 Document Revised: 09/30/2015 Document Reviewed: 05/29/2014 Elsevier Interactive Patient Education  2017 [employer].

## 2020-02-17 ENCOUNTER — Other Ambulatory Visit: Payer: Self-pay

## 2020-02-17 ENCOUNTER — Ambulatory Visit (INDEPENDENT_AMBULATORY_CARE_PROVIDER_SITE_OTHER): Payer: Medicare Other | Admitting: Family Medicine

## 2020-02-17 ENCOUNTER — Encounter: Payer: Self-pay | Admitting: Family Medicine

## 2020-02-17 VITALS — BP 166/82 | HR 57 | Temp 97.9°F | Ht 68.5 in | Wt 231.3 lb

## 2020-02-17 DIAGNOSIS — N289 Disorder of kidney and ureter, unspecified: Secondary | ICD-10-CM

## 2020-02-17 DIAGNOSIS — K219 Gastro-esophageal reflux disease without esophagitis: Secondary | ICD-10-CM

## 2020-02-17 DIAGNOSIS — M1A00X0 Idiopathic chronic gout, unspecified site, without tophus (tophi): Secondary | ICD-10-CM

## 2020-02-17 DIAGNOSIS — R140 Abdominal distension (gaseous): Secondary | ICD-10-CM | POA: Diagnosis not present

## 2020-02-17 DIAGNOSIS — Z23 Encounter for immunization: Secondary | ICD-10-CM

## 2020-02-17 DIAGNOSIS — I6523 Occlusion and stenosis of bilateral carotid arteries: Secondary | ICD-10-CM

## 2020-02-17 DIAGNOSIS — S98131A Complete traumatic amputation of one right lesser toe, initial encounter: Secondary | ICD-10-CM | POA: Diagnosis not present

## 2020-02-17 DIAGNOSIS — G4733 Obstructive sleep apnea (adult) (pediatric): Secondary | ICD-10-CM | POA: Diagnosis not present

## 2020-02-17 DIAGNOSIS — Z7189 Other specified counseling: Secondary | ICD-10-CM

## 2020-02-17 DIAGNOSIS — G3184 Mild cognitive impairment, so stated: Secondary | ICD-10-CM

## 2020-02-17 DIAGNOSIS — E669 Obesity, unspecified: Secondary | ICD-10-CM

## 2020-02-17 DIAGNOSIS — E785 Hyperlipidemia, unspecified: Secondary | ICD-10-CM | POA: Diagnosis not present

## 2020-02-17 DIAGNOSIS — R252 Cramp and spasm: Secondary | ICD-10-CM

## 2020-02-17 DIAGNOSIS — Z9989 Dependence on other enabling machines and devices: Secondary | ICD-10-CM

## 2020-02-17 DIAGNOSIS — R413 Other amnesia: Secondary | ICD-10-CM

## 2020-02-17 DIAGNOSIS — I10 Essential (primary) hypertension: Secondary | ICD-10-CM | POA: Diagnosis not present

## 2020-02-17 HISTORY — DX: Mild cognitive impairment of uncertain or unknown etiology: G31.84

## 2020-02-17 MED ORDER — METOPROLOL SUCCINATE ER 25 MG PO TB24: ORAL_TABLET | ORAL | 3 refills | Status: DC

## 2020-02-17 MED ORDER — ACETAMINOPHEN 500 MG PO TABS: 500.0000 mg | ORAL_TABLET | Freq: Two times a day (BID) | ORAL | Status: DC | PRN

## 2020-02-17 MED ORDER — HYDROCHLOROTHIAZIDE 25 MG PO TABS: 25.0000 mg | ORAL_TABLET | Freq: Every day | ORAL | 3 refills | Status: DC

## 2020-02-17 MED ORDER — ESOMEPRAZOLE MAGNESIUM 20 MG PO CPDR: 20.0000 mg | DELAYED_RELEASE_CAPSULE | Freq: Every day | ORAL | 1 refills | Status: DC

## 2020-02-17 MED ORDER — ALLOPURINOL 100 MG PO TABS: ORAL_TABLET | ORAL | 3 refills | Status: DC

## 2020-02-17 NOTE — Progress Notes (Signed)
This visit was conducted in person.  BP (!) 166/82 (BP Location: Right Arm, Patient Position: Sitting, Cuff Size: Normal)   Pulse (!) 57   Temp 97.9 F (36.6 C) (Temporal)   Ht 5' 8.5" (1.74 m)   Wt 231 lb 5 oz (104.9 kg)   SpO2 97%   BMI 34.66 kg/m   BP Readings from Last 3 Encounters:  02/17/20 (!) 166/82  09/04/19 (!) 160/65  08/20/19 120/80  BP on repeat 170/84   CC: AMW f/u visit  Subjective:    Patient ID: [name], male    DOB: Apr 25, 1932, 86 y.o.   MRN: [id number]  HPI: [name] is a 86 y.o. male presenting on 02/17/2020 for Annual Exam (Prt 2. )   Saw health advisor last week for medicare wellness visit. Note reviewed. Abnormal memory testing screen at that time.  No exam data present    Clinical Support from 02/11/2020 in [place] at [facility] Total Score 0      Fall Risk  02/11/2020 02/12/2019 12/05/2018 08/28/2017 08/23/2016  Falls in the past year? 0 0 0 No No  Comment - - Emmi Telephone Survey: data to providers prior to load - -  Number falls in past yr: 0 - - - -  Injury with Fall? 0 - - - -  Risk for fall due to : Medication side effect - - - -  Follow up Falls prevention discussed;Falls evaluation completed - - - -    HTN - compliant with toprol XL 25mg  daily, hctz 21.5mg  daily. Continues OTC potassium daily.  Takes ibuprofen 600mg  BID due to worsening joint pains. rec tylenol instead.  S/p L great toe and 2nd toe amputations earlier this year [facility]).  OSA on CPAP followed by neurology. GERD - asks about generic nexium 40mg  which is cheaper than his 20mg  esomeprazole OTC.   Increasing gassiness noted - eliminating cheese and beans has helped.  Increasing leg cramps - quinine sulfate helped some - now takes tonic water with benefit.   Memory loss - pt and wife notice trouble with this. Especially trouble remembering names. No getting lost while driving.   Geriatric Assessment:  Activities of Daily Living:      Bathing- independent     Dressing- independent     Eating-independent     Toileting-independent     Transferring-independent     Continence-independent  Overall Assessment: independent   Instrumental Activities of Daily Living:     Transportation- independent     Meal/Food Preparation- independent     Shopping Errands- independent - he does all grocery shopping    Housekeeping/Chores- independent     Money Management/Finances- independent - he manages family finances    Medication Management- independent     Ability to Use Telephone- independent     Laundry- independent  Overall Assessment: independent   Mental Status Exam: 24/30, 25 with cue (value/max value) Clock Drawing Score: 4/4      Preventative: Colon cancer screening -normal colonoscopies, last ~2009 - aged out Prostate cancer screening -age out Lung cancer screening -not eligible Flu shot -yearly Tdap 2014 [place] 06/2019, 07/2019, 02/2020 Pneumovax 2012, prevnar 2016 zostavax 2009 shingrix - discussed Advanced directive discussion -needs to update.Would want wife to be HCPOA. Packet previously provided.  Seat belt use discussed.  Sunscreen use discussed. No changing moles on skin.Sees derm tomorrow.  Non smoker Alcohol - was heavy drinker,has cut down significantly to < fifth/month  Dentist q6 mo  Eye exam yearly ([place])  Bowel - constipation - managed with miralax  Bladder - no incontinence   Widower. Second marriage to [place]: retired [employer]: college chemistry degree- going to real estate school Right handed Caffeine: Drinks 1 cup of coffee every morning     Relevant past medical, surgical, family and social history reviewed and updated as indicated. Interim medical history since our last visit reviewed. Allergies and medications reviewed and updated. Outpatient Medications Prior to Visit  Medication Sig  Dispense Refill  . aspirin EC 81 MG tablet Take 81 mg by mouth daily.    . carboxymethylcellulose (REFRESH TEARS) 0.5 % SOLN Place 1 drop into both eyes daily as needed (dry eyes).    . cetirizine (ZYRTEC) 10 MG tablet Take 10 mg by mouth daily. Alternates weekly with allegra.    . fexofenadine (ALLEGRA) 180 MG tablet Take 180 mg by mouth daily. Alternates weekly with zyrtec.    Marland Kitchen GLUCOSAMINE-CHONDROITIN PO Take 2 tablets by mouth daily.    . Multiple Vitamins-Minerals (MULTIVITAMIN ADULT PO) Take 1 tablet by mouth daily.    . polyethylene glycol (MIRALAX / GLYCOLAX) packet Take 8.5 g by mouth daily.     . Potassium 99 MG TABS Take 99 mg by mouth daily.    Marland Kitchen allopurinol (ZYLOPRIM) 100 MG tablet TAKE 2 TABLETS(200 MG) BY MOUTH DAILY 180 tablet 0  . hydrochlorothiazide (MICROZIDE) 12.5 MG capsule TAKE 1 CAPSULE(12.5 MG) BY MOUTH DAILY 90 capsule 0  . metoprolol succinate (TOPROL-XL) 25 MG 24 hr tablet TAKE 1 TABLET BY MOUTH DAILY. TAKE WITH OR IMMEDIATELY FOLLOWING A MEAL (Patient taking differently: Take 25 mg by mouth daily. TAKE 1 TABLET BY MOUTH DAILY. TAKE WITH OR IMMEDIATELY FOLLOWING A MEAL) 90 tablet 3  . naproxen sodium (ALEVE) 220 MG tablet Take 440 mg by mouth daily.    Marland Kitchen omeprazole (PRILOSEC) 20 MG capsule Take 20 mg by mouth daily.     No facility-administered medications prior to visit.     Per HPI unless specifically indicated in ROS section below Review of Systems Objective:  BP (!) 166/82 (BP Location: Right Arm, Patient Position: Sitting, Cuff Size: Normal)   Pulse (!) 57   Temp 97.9 F (36.6 C) (Temporal)   Ht 5' 8.5" (1.74 m)   Wt 231 lb 5 oz (104.9 kg)   SpO2 97%   BMI 34.66 kg/m   Wt Readings from Last 3 Encounters:  02/17/20 231 lb 5 oz (104.9 kg)  09/04/19 233 lb (105.7 kg)  08/20/19 232 lb 1 oz (105.3 kg)      Physical Exam Vitals and nursing note reviewed.  Constitutional:      General: He is not in acute distress.    Appearance: Normal appearance. He  is well-developed. He is obese. He is not ill-appearing.  HENT:     Head: Normocephalic and atraumatic.     Right Ear: Hearing normal.     Left Ear: Hearing normal.     Ears:     Comments: Hearing aides in place    Mouth/Throat:     Pharynx: Uvula midline.  Eyes:     General: No scleral icterus.    Extraocular Movements: Extraocular movements intact.     Conjunctiva/sclera: Conjunctivae normal.     Pupils: Pupils are equal, round, and reactive to light.  Neck:     Vascular: Carotid bruit (mild L side) present.  Cardiovascular:  Rate and Rhythm: Normal rate and regular rhythm.     Pulses: Normal pulses.          Radial pulses are 2+ on the right side and 2+ on the left side.     Heart sounds: Normal heart sounds. No murmur heard.   Pulmonary:     Effort: Pulmonary effort is normal. No respiratory distress.     Breath sounds: Normal breath sounds. No wheezing, rhonchi or rales.  Abdominal:     General: Abdomen is flat. Bowel sounds are normal. There is no distension.     Palpations: Abdomen is soft. There is no mass.     Tenderness: There is no abdominal tenderness. There is no guarding or rebound.     Hernia: No hernia is present.  Musculoskeletal:        General: Normal range of motion.     Cervical back: Normal range of motion and neck supple.     Right lower leg: No edema.     Left lower leg: No edema.     Comments: S/p L 1st/2nd toe amputations  Lymphadenopathy:     Cervical: No cervical adenopathy.  Skin:    General: Skin is warm and dry.     Findings: No rash.  Neurological:     General: No focal deficit present.     Mental Status: He is alert and oriented to person, place, and time.     Comments: CN grossly intact, station and gait intact  Psychiatric:        Mood and Affect: Mood normal.        Behavior: Behavior normal.        Thought Content: Thought content normal.        Judgment: Judgment normal.       Results for orders placed or performed in visit  on 02/11/20  Microalbumin / creatinine urine ratio  Result Value Ref Range   Microalb, Ur 2.6 (H) 0.0 - 1.9 mg/dL   Creatinine,U 152.4 mg/dL   Microalb Creat Ratio 1.7 0.0 - 30.0 mg/g  Uric acid  Result Value Ref Range   Uric Acid, Serum 5.4 4.0 - 7.8 mg/dL  Lipid panel  Result Value Ref Range   Cholesterol 130 0 - 200 mg/dL   Triglycerides 157.0 (H) 0 - 149 mg/dL   HDL 32.00 (L) >39.00 mg/dL   VLDL 31.4 0.0 - 40.0 mg/dL   LDL Cholesterol 67 0 - 99 mg/dL   Total CHOL/HDL Ratio 4    NonHDL 98.02   Comprehensive metabolic panel  Result Value Ref Range   Sodium 138 135 - 145 mEq/L   Potassium 4.3 3.5 - 5.1 mEq/L   Chloride 101 96 - 112 mEq/L   CO2 30 19 - 32 mEq/L   Glucose, Bld 99 70 - 99 mg/dL   BUN 23 6 - 23 mg/dL   Creatinine, Ser 1.23 0.40 - 1.50 mg/dL   Total Bilirubin 0.6 0.2 - 1.2 mg/dL   Alkaline Phosphatase 51 39 - 117 U/L   AST 29 0 - 37 U/L   ALT 29 0 - 53 U/L   Total Protein 6.9 6.0 - 8.3 g/dL   Albumin 4.0 3.5 - 5.2 g/dL   GFR 52.77 (L) >60.00 mL/min   Calcium 9.1 8.4 - 10.5 mg/dL   Assessment & Plan:  This visit occurred during the SARS-CoV-2 public health emergency.  Safety protocols were in place, including screening questions prior to the visit, additional usage of staff PPE,  and extensive cleaning of exam room while observing appropriate contact time as indicated for disinfecting solutions.  Over 50 minutes were spent face-to-face with the patient during this encounter or in coordination of care, reviewing past records, time spent documenting, lab results or radiology, or educating the patient or family.  Problem List Items Addressed This Visit    OSA on CPAP    Followed by neurology.       Obesity, Class I, BMI 30.0-34.9 (see actual BMI)   Memory deficit    Progressive trouble noted by pt, failed memory questions during AMW with health coach, family also noted. MMSR decreased today. Reviewed with patient. Will focus on lifestyle/diet changes as per  instructions, would consider trial of aricept. RTC 3 mo HTN and memory f/u visit. Check further memory labs at 2 wk lab visit.  MMSE (02/2020) 24/30, 25 with cue. Misses 3 orientation, 2 recall (1 with cue), 1 language. Independent ADLs and IADLs. 4/4 clock drawing test.       Relevant Orders   RPR   TSH   Vitamin B12   Leg cramps    Discussed tonic water       HTN (hypertension)    Chronic, uncontrolled despite taking hctz and metoprolol daily. Beta blocker titration limited by bradycardia. Will increase hctz to 25mg  daily, I have asked him to return in 2 wks to check Cr, K. Also discussed limiting NSAID use.      Relevant Medications   metoprolol succinate (TOPROL-XL) 25 MG 24 hr tablet   hydrochlorothiazide (HYDRODIURIL) 25 MG tablet   Gout    Stable period on allopurinol 200mg  without recent gout flare.       GERD (gastroesophageal reflux disease)    States he takes nexium 20mg  OTC daily with benefit - don't think he needs 40mg . Rx for 20mg  sent to pharmacy.       Relevant Medications   esomeprazole (NEXIUM) 20 MG capsule   Gassiness    Discussed GasX use.       Dyslipidemia    Chronic, overall stable off statin. Consider adding given known mild carotid disease, will defer for now.  The ASCVD Risk score [name] DC Jr., et al., 2013) failed to calculate for the following reasons:   The 2013 ASCVD risk score is only valid for ages 58 to 75       Carotid stenosis, asymptomatic, bilateral    Latest carotid US stable. Consider adding statin, will defer for now.       Relevant Medications   metoprolol succinate (TOPROL-XL) 25 MG 24 hr tablet   hydrochlorothiazide (HYDRODIURIL) 25 MG tablet   Amputation of one or more toes ([place])   Advanced care planning/counseling discussion - Primary    Advanced directive discussion -needs to update.Would want wife to be HCPOA. Packet previously provided.        Other Visit Diagnoses    Need for influenza vaccination       Relevant  Orders   Flu Vaccine QUAD High Dose(Fluad) (Completed)   Renal insufficiency       Relevant Orders   Renal function panel       Meds ordered this encounter  Medications  . esomeprazole (NEXIUM) 20 MG capsule    Sig: Take 1 capsule (20 mg total) by mouth daily at 12 noon.    Dispense:  90 capsule    Refill:  1  . acetaminophen (TYLENOL) 500 MG tablet    Sig: Take 1-2 tablets (500-1,000 mg total)  by mouth 2 (two) times daily as needed for moderate pain.  Marland Kitchen allopurinol (ZYLOPRIM) 100 MG tablet    Sig: TAKE 2 TABLETS(200 MG) BY MOUTH DAILY    Dispense:  180 tablet    Refill:  3  . metoprolol succinate (TOPROL-XL) 25 MG 24 hr tablet    Sig: TAKE 1 TABLET BY MOUTH DAILY. TAKE WITH OR IMMEDIATELY FOLLOWING A MEAL    Dispense:  90 tablet    Refill:  3  . hydrochlorothiazide (HYDRODIURIL) 25 MG tablet    Sig: Take 1 tablet (25 mg total) by mouth daily.    Dispense:  90 tablet    Refill:  3   Orders Placed This Encounter  Procedures  . Flu Vaccine QUAD High Dose(Fluad)  . Renal function panel    Standing Status:   Future    Standing Expiration Date:   02/16/2021  . RPR    Standing Status:   Future    Standing Expiration Date:   02/16/2021  . TSH    Standing Status:   Future    Standing Expiration Date:   02/16/2021  . Vitamin B12    Standing Status:   Future    Standing Expiration Date:   02/16/2021    Patient instructions: Flu shot today  BP staying too high - increase hydrochlorothiazide to 25mg  daily, continue metoprolol 25mg  daily. Return in 10 days for labs only. Return in 3 months for BP follow up visit.  If interested, check with pharmacy about new 2 shot shingles series (shingrix). You would have to wait 1 month between shingles and covid shots.  Change ibuprofen to tylenol 500mg  1-2 twice daily as needed. The ibuprofen could contribute to elevated blood pressures.  If blood pressure staying high despite this, let me know to increase hydrochlorothiazide dose.  May  try Gas X (simethicone) over the counter.  Memory testing was a bit abnormal - continue working on healthy nutritious diet, using mind (reading, word puzzles, etc), physical activity (restart walking routine), and social engagement (having conversations with family and friends). We could consider trial of memory medicine like aricept daily to help slow worsening memory trouble - let me know if interested.   Follow up plan: Return in about 3 months (around 05/19/2020), or if symptoms worsen or fail to improve, for follow up visit.  Ria Bush, MD

## 2020-02-17 NOTE — Assessment & Plan Note (Signed)
Advanced directive discussion -needs to update.Would want wife to be HCPOA. Packet previously provided.

## 2020-02-17 NOTE — Assessment & Plan Note (Signed)
Chronic, overall stable off statin. Consider adding given known mild carotid disease, will defer for now.  The ASCVD Risk score Paul Bussing DC Jr., et al., 2013) failed to calculate for the following reasons:   The 2013 ASCVD risk score is only valid for ages 53 to 81

## 2020-02-17 NOTE — Assessment & Plan Note (Addendum)
Progressive trouble noted by pt, failed memory questions during AMW with health coach, family also noted. MMSR decreased today. Reviewed with patient. Will focus on lifestyle/diet changes as per instructions, would consider trial of aricept. RTC 3 mo HTN and memory f/u visit. Check further memory labs at 2 wk lab visit.  MMSE (02/2020) 24/30, 25 with cue. Misses 3 orientation, 2 recall (1 with cue), 1 language. Independent ADLs and IADLs. 4/4 clock drawing test.

## 2020-02-17 NOTE — Assessment & Plan Note (Addendum)
Latest carotid US stable. Consider adding statin, will defer for now.

## 2020-02-17 NOTE — Assessment & Plan Note (Signed)
Followed by neurology.   

## 2020-02-17 NOTE — Assessment & Plan Note (Addendum)
States he takes nexium 20mg  OTC daily with benefit - don't think he needs 40mg . Rx for 20mg  sent to pharmacy.

## 2020-02-17 NOTE — Patient Instructions (Addendum)
Flu shot today  BP staying too high - increase hydrochlorothiazide to 25mg  daily, continue metoprolol 25mg  daily. Return in 10 days for labs only. Return in 3 months for BP follow up visit.  If interested, check with pharmacy about new 2 shot shingles series (shingrix). You would have to wait 1 month between shingles and covid shots.  Change ibuprofen to tylenol 500mg  1-2 twice daily as needed. The ibuprofen could contribute to elevated blood pressures.  If blood pressure staying high despite this, let me know to increase hydrochlorothiazide dose.  May try Gas X (simethicone) over the counter.  Memory testing was a bit abnormal - continue working on healthy nutritious diet, using mind (reading, word puzzles, etc), physical activity (restart walking routine), and social engagement (having conversations with family and friends). We could consider trial of memory medicine like aricept daily to help slow worsening memory trouble - let me know if interested.   Memory Compensation Strategies  1. Use "WARM" strategy.  W= write it down  A= associate it  R= repeat it  M= make a mental note  2.   You can keep a Social worker.  Use a 3-ring notebook with sections for the following: calendar, important names and phone numbers,  medications, doctors' names/phone numbers, lists/reminders, and a section to journal what you did  each day.   3.    Use a calendar to write appointments down.  4.    Write yourself a schedule for the day.  This can be placed on the calendar or in a separate section of the Memory Notebook.  Keeping a  regular schedule can help memory.  5.    Use medication organizer with sections for each day or morning/evening pills.  You may need help loading it  6.    Keep a basket, or pegboard by the door.  Place items that you need to take out with you in the basket or on the pegboard.  You may also want to  include a message board for reminders.  7.    Use sticky notes.  Place  sticky notes with reminders in a place where the task is performed.  For example: " turn off the  stove" placed by the stove, "lock the door" placed on the door at eye level, " take your medications" on  the bathroom mirror or by the place where you normally take your medications.  8.    Use alarms/timers.  Use while cooking to remind yourself to check on food or as a reminder to take your medicine, or as a  reminder to make a call, or as a reminder to perform another task, etc.

## 2020-02-17 NOTE — Assessment & Plan Note (Addendum)
Stable period on allopurinol 200mg  without recent gout flare.

## 2020-02-17 NOTE — Assessment & Plan Note (Addendum)
Chronic, uncontrolled despite taking hctz and metoprolol daily. Beta blocker titration limited by bradycardia. Will increase hctz to 25mg  daily, I have asked him to return in 2 wks to check Cr, K. Also discussed limiting NSAID use.

## 2020-02-17 NOTE — Assessment & Plan Note (Signed)
Discussed tonic water

## 2020-02-17 NOTE — Assessment & Plan Note (Signed)
Discussed GasX use.

## 2020-02-18 ENCOUNTER — Other Ambulatory Visit: Payer: Self-pay

## 2020-02-18 ENCOUNTER — Ambulatory Visit (INDEPENDENT_AMBULATORY_CARE_PROVIDER_SITE_OTHER): Payer: Medicare Other | Admitting: Dermatology

## 2020-02-18 DIAGNOSIS — L820 Inflamed seborrheic keratosis: Secondary | ICD-10-CM | POA: Diagnosis not present

## 2020-02-18 DIAGNOSIS — L570 Actinic keratosis: Secondary | ICD-10-CM | POA: Diagnosis not present

## 2020-02-18 DIAGNOSIS — I6523 Occlusion and stenosis of bilateral carotid arteries: Secondary | ICD-10-CM

## 2020-02-18 DIAGNOSIS — L578 Other skin changes due to chronic exposure to nonionizing radiation: Secondary | ICD-10-CM | POA: Diagnosis not present

## 2020-02-18 DIAGNOSIS — L821 Other seborrheic keratosis: Secondary | ICD-10-CM

## 2020-02-18 NOTE — Progress Notes (Signed)
   Follow-Up Visit   Subjective  [name] is a 86 y.o. male who presents for the following: Actinic Keratosis (63m f/u, face, ears, scalp) and itchy spot (back, ~73m). He has other areas to be evaluated today.  The following portions of the chart were reviewed this encounter and updated as appropriate:  Tobacco  Allergies  Meds  Problems  Med Hx  Surg Hx  Fam Hx     Review of Systems:  No other skin or systemic complaints except as noted in HPI or Assessment and Plan.  Objective  Well appearing patient in no apparent distress; mood and affect are within normal limits.  A focused examination was performed including face, ears, scalp, back, sun exposed areas. Relevant physical exam findings are noted in the Assessment and Plan.  Objective  back x 2 (2): Erythematous keratotic or waxy stuck-on papule or plaque.   Objective  face/ears x 24, R neck x 1 (25): Pink scaly macules    Assessment & Plan    Seborrheic Keratoses - Stuck-on, waxy, tan-brown papules and plaques  - Discussed benign etiology and prognosis. - Observe - Call for any changes  Actinic Damage - diffuse scaly erythematous macules with underlying dyspigmentation - Recommend daily broad spectrum sunscreen SPF 30+ to sun-exposed areas, reapply every 2 hours as needed.  - Call for new or changing lesions.   Inflamed seborrheic keratosis (2) back x 2  Destruction of lesion - back x 2 Complexity: simple   Destruction method: cryotherapy   Informed consent: discussed and consent obtained   Timeout:  patient name, date of birth, surgical site, and procedure verified Lesion destroyed using liquid nitrogen: Yes   Region frozen until ice ball extended beyond lesion: Yes   Outcome: patient tolerated procedure well with no complications   Post-procedure details: wound care instructions given    AK (actinic keratosis) (25) face/ears x 24, R neck x 1  Plan PDT with ALA to face in 1  month  Destruction of lesion - face/ears x 24, R neck x 1 Complexity: simple   Destruction method: cryotherapy   Informed consent: discussed and consent obtained   Timeout:  patient name, date of birth, surgical site, and procedure verified Lesion destroyed using liquid nitrogen: Yes   Region frozen until ice ball extended beyond lesion: Yes   Outcome: patient tolerated procedure well with no complications   Post-procedure details: wound care instructions given    Return in about 1 month (around 03/20/2020) for PDT to face, 59m AK f/u.   I, Othelia Pulling, RMA, am acting as scribe for Sarina Ser, MD .  Documentation: I have reviewed the above documentation for accuracy and completeness, and I agree with the above.  Sarina Ser, MD

## 2020-02-19 ENCOUNTER — Encounter: Payer: Self-pay | Admitting: Dermatology

## 2020-03-01 ENCOUNTER — Telehealth (INDEPENDENT_AMBULATORY_CARE_PROVIDER_SITE_OTHER): Payer: Medicare Other | Admitting: Family Medicine

## 2020-03-01 ENCOUNTER — Other Ambulatory Visit: Payer: Self-pay

## 2020-03-01 ENCOUNTER — Other Ambulatory Visit (INDEPENDENT_AMBULATORY_CARE_PROVIDER_SITE_OTHER): Payer: Medicare Other

## 2020-03-01 ENCOUNTER — Encounter: Payer: Self-pay | Admitting: Family Medicine

## 2020-03-01 DIAGNOSIS — R413 Other amnesia: Secondary | ICD-10-CM | POA: Diagnosis not present

## 2020-03-01 DIAGNOSIS — J302 Other seasonal allergic rhinitis: Secondary | ICD-10-CM

## 2020-03-01 DIAGNOSIS — N289 Disorder of kidney and ureter, unspecified: Secondary | ICD-10-CM | POA: Diagnosis not present

## 2020-03-01 LAB — VITAMIN B12: Vitamin B-12: 491 pg/mL (ref 211–911)

## 2020-03-01 LAB — RENAL FUNCTION PANEL
Albumin: 4.1 g/dL (ref 3.5–5.2)
BUN: 13 mg/dL (ref 6–23)
CO2: 29 mEq/L (ref 19–32)
Calcium: 9.1 mg/dL (ref 8.4–10.5)
Chloride: 100 mEq/L (ref 96–112)
Creatinine, Ser: 1.09 mg/dL (ref 0.40–1.50)
GFR: 61.59 mL/min (ref >60.00)
Glucose, Bld: 97 mg/dL (ref 70–99)
Phosphorus: 4.3 mg/dL (ref 2.3–4.6)
Potassium: 4.2 mEq/L (ref 3.5–5.1)
Sodium: 139 mEq/L (ref 135–145)

## 2020-03-01 LAB — TSH: TSH: 2.75 u[IU]/mL (ref 0.35–4.50)

## 2020-03-01 MED ORDER — FLUTICASONE PROPIONATE 50 MCG/ACT NA SUSP: 2.0000 | Freq: Every day | NASAL | 6 refills | Status: DC

## 2020-03-01 NOTE — Progress Notes (Signed)
Virtual Visit via Telephone Note  I connected with [name] on 03/01/20 at 12:00 PM EDT by telephone and verified that I am speaking with the correct person using two identifiers.  Location: Patient: In his home Provider: [place] Persons participating in virtual visit: Patient, provider   I discussed the limitations, risks, security and privacy concerns of performing an evaluation and management service by telephone and the availability of in person appointments. I also discussed with the patient that there may be a patient responsible charge related to this service. The patient expressed understanding and agreed to proceed.   History of Present Illness: Chief Complaint  Patient presents with  . Nasal Congestion     running nose x 3 days. Pt's wife usually help pt with video visit but she is not home. Pt ok with phone visit.   This is an 86 year old male who presents today with above chief complaint.  He has a history of seasonal allergies for 45 years, always in the fall.  He reports that he has had 3 days of clear watery nasal drainage, some nasal congestion.  He denies fever, headache, sore throat, ear pain, cough, shortness of breath, chest congestion.  He reports that he was outside quite a bit last [place]  He takes fexofenadine 180 mg for 7 days and then alternates with cetirizine 10 mg for 7 days.  He is just finishing up a week of fexofenadine and will be starting his cetirizine tomorrow.  He did have some took his own nasal spray at home that he used last night and felt that he had some improvement.  He noticed today that it is expired so he has not used it further.   Observations/Objective: Patient is alert and normally conversive on the telephone.  He is able to speak in complete sentences.  There is no audible wheeze or cough.  There were no vitals taken for this visit. Wt Readings from Last 3 Encounters:  02/17/20 231 lb 5 oz  (104.9 kg)  09/04/19 233 lb (105.7 kg)  08/20/19 232 lb 1 oz (105.3 kg)   BP Readings from Last 3 Encounters:  02/17/20 (!) 166/82  09/04/19 (!) 160/65  08/20/19 120/80    Assessment and Plan: 1. Seasonal allergic rhinitis, unspecified trigger -No suspicion for bacterial infection.  Discussed follow-up precautions, fever/chills, chest congestion, shortness of breath, productive cough. -Advised him to continue his oral long-acting antihistamines, can also add normal saline nasal rinses after being outside -We will have him restart his fluticasone nasal spray and follow-up if no improvement in a couple of days or if any worsening symptoms - fluticasone (FLONASE) 50 MCG/ACT nasal spray; Place 2 sprays into both nostrils daily.  Dispense: 16 g; Refill: [place], FNP-BC  [facility], [place]  03/01/2020 12:28 PM   Follow Up Instructions:    I discussed the assessment and treatment plan with the patient. The patient was provided an opportunity to ask questions and all were answered. The patient agreed with the plan and demonstrated an understanding of the instructions.   The patient was advised to call back or seek an in-person evaluation if the symptoms worsen or if the condition fails to improve as anticipated.  I provided 5 minutes of non-face-to-face time during this encounter.   Elby Beck, FNP

## 2020-03-02 LAB — RPR: RPR Ser Ql: NONREACTIVE

## 2020-03-09 DIAGNOSIS — M9903 Segmental and somatic dysfunction of lumbar region: Secondary | ICD-10-CM | POA: Diagnosis not present

## 2020-03-09 DIAGNOSIS — Z23 Encounter for immunization: Secondary | ICD-10-CM | POA: Diagnosis not present

## 2020-03-09 DIAGNOSIS — M9905 Segmental and somatic dysfunction of pelvic region: Secondary | ICD-10-CM | POA: Diagnosis not present

## 2020-03-09 DIAGNOSIS — M5416 Radiculopathy, lumbar region: Secondary | ICD-10-CM | POA: Diagnosis not present

## 2020-03-09 DIAGNOSIS — M955 Acquired deformity of pelvis: Secondary | ICD-10-CM | POA: Diagnosis not present

## 2020-03-15 ENCOUNTER — Ambulatory Visit (INDEPENDENT_AMBULATORY_CARE_PROVIDER_SITE_OTHER): Payer: Medicare Other

## 2020-03-15 ENCOUNTER — Other Ambulatory Visit: Payer: Self-pay

## 2020-03-15 DIAGNOSIS — L570 Actinic keratosis: Secondary | ICD-10-CM

## 2020-03-15 MED ORDER — AMINOLEVULINIC ACID HCL 20 % EX SOLR
1.0000 "application " | Freq: Once | CUTANEOUS | Status: AC
  Administered 2020-03-15: 354 mg via TOPICAL

## 2020-03-15 NOTE — Progress Notes (Signed)

## 2020-03-15 NOTE — Patient Instructions (Signed)

## 2020-03-22 ENCOUNTER — Ambulatory Visit: Payer: Medicare Other

## 2020-03-29 DIAGNOSIS — M955 Acquired deformity of pelvis: Secondary | ICD-10-CM | POA: Diagnosis not present

## 2020-03-29 DIAGNOSIS — M5416 Radiculopathy, lumbar region: Secondary | ICD-10-CM | POA: Diagnosis not present

## 2020-03-29 DIAGNOSIS — M9903 Segmental and somatic dysfunction of lumbar region: Secondary | ICD-10-CM | POA: Diagnosis not present

## 2020-03-29 DIAGNOSIS — M9905 Segmental and somatic dysfunction of pelvic region: Secondary | ICD-10-CM | POA: Diagnosis not present

## 2020-04-08 ENCOUNTER — Telehealth: Payer: Self-pay | Admitting: Family Medicine

## 2020-04-08 NOTE — Telephone Encounter (Signed)
Left message on vm per dpr, on cell #, relaying current med list in pt's chart.

## 2020-04-08 NOTE — Telephone Encounter (Signed)
Pt called needs a list of medication read off due to they are at insurance agent and trying to fix their insurance.

## 2020-04-26 DIAGNOSIS — M9903 Segmental and somatic dysfunction of lumbar region: Secondary | ICD-10-CM | POA: Diagnosis not present

## 2020-04-26 DIAGNOSIS — M5416 Radiculopathy, lumbar region: Secondary | ICD-10-CM | POA: Diagnosis not present

## 2020-04-26 DIAGNOSIS — M955 Acquired deformity of pelvis: Secondary | ICD-10-CM | POA: Diagnosis not present

## 2020-04-26 DIAGNOSIS — M9905 Segmental and somatic dysfunction of pelvic region: Secondary | ICD-10-CM | POA: Diagnosis not present

## 2020-05-18 DIAGNOSIS — M955 Acquired deformity of pelvis: Secondary | ICD-10-CM | POA: Diagnosis not present

## 2020-05-18 DIAGNOSIS — M9905 Segmental and somatic dysfunction of pelvic region: Secondary | ICD-10-CM | POA: Diagnosis not present

## 2020-05-18 DIAGNOSIS — M9903 Segmental and somatic dysfunction of lumbar region: Secondary | ICD-10-CM | POA: Diagnosis not present

## 2020-05-18 DIAGNOSIS — M5416 Radiculopathy, lumbar region: Secondary | ICD-10-CM | POA: Diagnosis not present

## 2020-05-19 ENCOUNTER — Other Ambulatory Visit: Payer: Self-pay

## 2020-05-19 ENCOUNTER — Ambulatory Visit (INDEPENDENT_AMBULATORY_CARE_PROVIDER_SITE_OTHER): Payer: Medicare Other | Admitting: Family Medicine

## 2020-05-19 VITALS — BP 170/78 | HR 58 | Temp 98.3°F | Ht 69.5 in | Wt 237.5 lb

## 2020-05-19 DIAGNOSIS — I10 Essential (primary) hypertension: Secondary | ICD-10-CM

## 2020-05-19 DIAGNOSIS — R413 Other amnesia: Secondary | ICD-10-CM | POA: Diagnosis not present

## 2020-05-19 DIAGNOSIS — E785 Hyperlipidemia, unspecified: Secondary | ICD-10-CM

## 2020-05-19 LAB — BASIC METABOLIC PANEL
BUN: 24 mg/dL — ABNORMAL HIGH (ref 6–23)
CO2: 30 mEq/L (ref 19–32)
Calcium: 9.3 mg/dL (ref 8.4–10.5)
Chloride: 103 mEq/L (ref 96–112)
Creatinine, Ser: 1.14 mg/dL (ref 0.40–1.50)
GFR: 58.28 mL/min — ABNORMAL LOW (ref >60.00)
Glucose, Bld: 100 mg/dL — ABNORMAL HIGH (ref 70–99)
Potassium: 4.3 mEq/L (ref 3.5–5.1)
Sodium: 139 mEq/L (ref 135–145)

## 2020-05-19 MED ORDER — LOSARTAN POTASSIUM-HCTZ 50-12.5 MG PO TABS: 1.0000 | ORAL_TABLET | Freq: Every day | ORAL | 3 refills | Status: DC

## 2020-05-19 MED ORDER — DONEPEZIL HCL 5 MG PO TABS: 5.0000 mg | ORAL_TABLET | Freq: Every day | ORAL | 3 refills | Status: DC

## 2020-05-19 NOTE — Progress Notes (Signed)
Patient ID: [name], male    DOB: 20-Jun-1931, 87 y.o.   MRN: [id number]  This visit was conducted in person.  BP (!) 170/78 (BP Location: Right Arm, Patient Position: Sitting, Cuff Size: Normal)    Pulse (!) 58    Temp 98.3 F (36.8 C) (Temporal)    Ht 5' 9.5" (1.765 m)    Wt 237 lb 8 oz (107.7 kg)    SpO2 99%    BMI 34.57 kg/m   BP Readings from Last 3 Encounters:  05/19/20 (!) 170/78  02/17/20 (!) 166/82  09/04/19 (!) 160/65  180/80 on retesting.   CC: HTN f/u visit  Subjective:   HPI: [name] is a 87 y.o. male presenting on 05/19/2020 for Annual Exam   See prior note for details.   Last visit we increased HCTZ to 25mg  due to persistent hypertension. He continues metoprolol XR 25mg  daily. BP remains high in office. He doesn't monitor BP at home. No HA, vision changes, CP/tightness, SOB, leg swelling.   1 cup coffee/day.  No regular exercise routine.  Both his siblings have had heart disease s/p stents.   Worsening memory with MMSE 24/30 last visit - reviewed healthy diet and lifestyle choices to keep healthy mind. He notes ongoing memory trouble. Interested in trial memory medication.      Relevant past medical, surgical, family and social history reviewed and updated as indicated. Interim medical history since our last visit reviewed. Allergies and medications reviewed and updated. Outpatient Medications Prior to Visit  Medication Sig Dispense Refill   acetaminophen (TYLENOL) 500 MG tablet Take 1-2 tablets (500-1,000 mg total) by mouth 2 (two) times daily as needed for moderate pain. (Patient taking differently: Take 500-1,000 mg by mouth 2 (two) times daily as needed for moderate pain. Pt states taking 3 tablets)     allopurinol (ZYLOPRIM) 100 MG tablet TAKE 2 TABLETS(200 MG) BY MOUTH DAILY 180 tablet 3   aspirin EC 81 MG tablet Take 81 mg by mouth daily.     carboxymethylcellulose (REFRESH TEARS) 0.5 % SOLN Place 1 drop into both eyes daily as needed  (dry eyes).     cetirizine (ZYRTEC) 10 MG tablet Take 10 mg by mouth daily. Alternates weekly with allegra.     esomeprazole (NEXIUM) 20 MG capsule Take 1 capsule (20 mg total) by mouth daily at 12 noon. 90 capsule 1   fexofenadine (ALLEGRA) 180 MG tablet Take 180 mg by mouth daily. Alternates weekly with zyrtec.     fluticasone (FLONASE) 50 MCG/ACT nasal spray Place 2 sprays into both nostrils daily. 16 g 6   GLUCOSAMINE-CHONDROITIN PO Take 2 tablets by mouth daily.     metoprolol succinate (TOPROL-XL) 25 MG 24 hr tablet TAKE 1 TABLET BY MOUTH DAILY. TAKE WITH OR IMMEDIATELY FOLLOWING A MEAL 90 tablet 3   Multiple Vitamins-Minerals (MULTIVITAMIN ADULT PO) Take 1 tablet by mouth daily.     polyethylene glycol (MIRALAX / GLYCOLAX) packet Take 8.5 g by mouth daily.      Potassium 99 MG TABS Take 99 mg by mouth daily.     hydrochlorothiazide (HYDRODIURIL) 25 MG tablet Take 1 tablet (25 mg total) by mouth daily. 90 tablet 3   No facility-administered medications prior to visit.     Per HPI unless specifically indicated in ROS section below Review of Systems Objective:  BP (!) 170/78 (BP Location: Right Arm, Patient Position: Sitting, Cuff Size: Normal)    Pulse (!) 58  Temp 98.3 F (36.8 C) (Temporal)    Ht 5' 9.5" (1.765 m)    Wt 237 lb 8 oz (107.7 kg)    SpO2 99%    BMI 34.57 kg/m   Wt Readings from Last 3 Encounters:  05/19/20 237 lb 8 oz (107.7 kg)  02/17/20 231 lb 5 oz (104.9 kg)  09/04/19 233 lb (105.7 kg)      Physical Exam Vitals and nursing note reviewed.  Constitutional:      Appearance: Normal appearance. He is obese. He is not ill-appearing.  Neck:     Thyroid: No thyroid mass or thyromegaly.  Cardiovascular:     Rate and Rhythm: Normal rate and regular rhythm.     Pulses: Normal pulses.     Heart sounds: No murmur heard.   Pulmonary:     Effort: Pulmonary effort is normal. No respiratory distress.     Breath sounds: Normal breath sounds. No wheezing,  rhonchi or rales.  Musculoskeletal:     Right lower leg: No edema.     Left lower leg: No edema.  Skin:    General: Skin is warm and dry.     Findings: No rash.  Neurological:     Mental Status: He is alert.  Psychiatric:        Mood and Affect: Mood normal.       Results for orders placed or performed in visit on 0000000  Basic metabolic panel  Result Value Ref Range   Sodium 139 135 - 145 mEq/L   Potassium 4.3 3.5 - 5.1 mEq/L   Chloride 103 96 - 112 mEq/L   CO2 30 19 - 32 mEq/L   Glucose, Bld 100 (H) 70 - 99 mg/dL   BUN 24 (H) 6 - 23 mg/dL   Creatinine, Ser 1.14 0.40 - 1.50 mg/dL   GFR 58.28 (L) >60.00 mL/min   Calcium 9.3 8.4 - 10.5 mg/dL   EKG - sinus bradycardia 50s, normal axis, intervals, no hypertrophy or acute ST/T changes, diffuse T wave flattening, frequent PACs.  Assessment & Plan:  This visit occurred during the SARS-CoV-2 public health emergency.  Safety protocols were in place, including screening questions prior to the visit, additional usage of staff PPE, and extensive cleaning of exam room while observing appropriate contact time as indicated for disinfecting solutions.   Problem List Items Addressed This Visit    Memory deficit    Trial aricept 5mg  daily, watch for worsening GERD.  Reassess at f/u visit.       HTN (hypertension) - Primary    Chronic, uncontrolled. Change hctz to losartan/hctz (50/12.5mg  dose). Encouraged limiting salt intake, handout provided. Discussed caffeine intake. RTC 1 wk Cr check, 6 wks f/u visit.  Baseline EKG today. Strong fmhx CAD (siblings with stents).       Relevant Medications   losartan-hydrochlorothiazide (HYZAAR) 50-12.5 MG tablet   Other Relevant Orders   Basic metabolic panel (Completed)   EKG 12-Lead (Completed)   Dyslipidemia    Not currently on statin.  Did not start given ongoing memory trouble.  Consider in the future.           Meds ordered this encounter  Medications    losartan-hydrochlorothiazide (HYZAAR) 50-12.5 MG tablet    Sig: Take 1 tablet by mouth daily.    Dispense:  90 tablet    Refill:  3    In place of plain hctz   donepezil (ARICEPT) 5 MG tablet    Sig: Take 1 tablet (  5 mg total) by mouth at bedtime.    Dispense:  30 tablet    Refill:  3   Orders Placed This Encounter  Procedures   Basic metabolic panel    Standing Status:   Future    Number of Occurrences:   1    Standing Expiration Date:   05/19/2021   EKG 12-Lead    Patient Instructions  EKG today  Start combo pill losartan hydrochlorothiazide 50/12.5mg  daily in place of hydrochlorothiazide 25mg . Return in 1 week for lab visit only.  Start checking blood pressures at home. Goal <140/90.  Keep log and drop off when complete for me to review.  For memory, trial aricept 5mg  daily - watch for worsening reflux/abdominal pain.   Your goal blood pressure is <140/90. Work on low salt/sodium diet - goal <1.5gm (1,500mg ) per day. Eat a diet high in fruits/vegetables and whole grains.  Look into mediterranean and DASH diet. Goal activity is 120min/wk of moderate intensity exercise.  This can be split into 30 minute chunks.  If you are not at this level, you can start with smaller 10-15 min increments and slowly build up activity. Look at [place].org for more resources    Follow up plan: Return in about 6 weeks (around 06/30/2020) for follow up visit.  Ria Bush, MD

## 2020-05-19 NOTE — Assessment & Plan Note (Signed)
Chronic, uncontrolled. Change hctz to losartan/hctz (50/12.5mg  dose). Encouraged limiting salt intake, handout provided. Discussed caffeine intake. RTC 1 wk Cr check, 6 wks f/u visit.  Baseline EKG today. Strong fmhx CAD (siblings with stents).

## 2020-05-19 NOTE — Assessment & Plan Note (Addendum)
Not currently on statin.  Did not start given ongoing memory trouble.  Consider in the future.

## 2020-05-19 NOTE — Assessment & Plan Note (Signed)
Trial aricept 5mg  daily, watch for worsening GERD.  Reassess at f/u visit.

## 2020-05-19 NOTE — Patient Instructions (Addendum)
EKG today  Start combo pill losartan hydrochlorothiazide 50/12.5mg  daily in place of hydrochlorothiazide 25mg . Return in 1 week for lab visit only.  Start checking blood pressures at home. Goal <140/90.  Keep log and drop off when complete for me to review.  For memory, trial aricept 5mg  daily - watch for worsening reflux/abdominal pain.   Your goal blood pressure is <140/90. Work on low salt/sodium diet - goal <1.5gm (1,500mg ) per day. Eat a diet high in fruits/vegetables and whole grains.  Look into mediterranean and DASH diet. Goal activity is 181min/wk of moderate intensity exercise.  This can be split into 30 minute chunks.  If you are not at this level, you can start with smaller 10-15 min increments and slowly build up activity. Look at [place].org for more resources

## 2020-05-20 ENCOUNTER — Encounter: Payer: Self-pay | Admitting: Family Medicine

## 2020-05-20 NOTE — Addendum Note (Signed)
Addended by: Ria Bush on: 05/20/2020 09:31 PM   Modules accepted: Orders

## 2020-05-26 ENCOUNTER — Other Ambulatory Visit (INDEPENDENT_AMBULATORY_CARE_PROVIDER_SITE_OTHER): Payer: Medicare Other

## 2020-05-26 ENCOUNTER — Other Ambulatory Visit: Payer: Medicare Other

## 2020-05-26 ENCOUNTER — Other Ambulatory Visit: Payer: Self-pay

## 2020-05-26 DIAGNOSIS — I10 Essential (primary) hypertension: Secondary | ICD-10-CM

## 2020-05-26 LAB — BASIC METABOLIC PANEL
BUN: 20 mg/dL (ref 6–23)
CO2: 32 mEq/L (ref 19–32)
Calcium: 9.8 mg/dL (ref 8.4–10.5)
Chloride: 100 mEq/L (ref 96–112)
Creatinine, Ser: 1.13 mg/dL (ref 0.40–1.50)
GFR: 58.89 mL/min — ABNORMAL LOW (ref >60.00)
Glucose, Bld: 100 mg/dL — ABNORMAL HIGH (ref 70–99)
Potassium: 4.1 mEq/L (ref 3.5–5.1)
Sodium: 137 mEq/L (ref 135–145)

## 2020-06-04 ENCOUNTER — Telehealth: Payer: Self-pay | Admitting: Family Medicine

## 2020-06-04 NOTE — Telephone Encounter (Signed)
Patient came in and dropped off BP reading card to share with PCP. Placed bright pink card on cart with label.

## 2020-06-08 DIAGNOSIS — M5416 Radiculopathy, lumbar region: Secondary | ICD-10-CM | POA: Diagnosis not present

## 2020-06-08 DIAGNOSIS — M955 Acquired deformity of pelvis: Secondary | ICD-10-CM | POA: Diagnosis not present

## 2020-06-08 DIAGNOSIS — M9903 Segmental and somatic dysfunction of lumbar region: Secondary | ICD-10-CM | POA: Diagnosis not present

## 2020-06-08 DIAGNOSIS — M9905 Segmental and somatic dysfunction of pelvic region: Secondary | ICD-10-CM | POA: Diagnosis not present

## 2020-06-08 NOTE — Telephone Encounter (Signed)
BP log reviewed - BP ranged from 124-177/60-80s.   He is on toprol XL 25mg  and hyzaar 50/12.5mg  daily. Hyzaar is recent addition. I think overall blood pressures are improving with new medicine - rec continue this without changes at this time.  Will recheck at f/u OV in 4 wks.   Would like him to keep checking a few times a week and bring new bp log to OV at end of February.

## 2020-06-08 NOTE — Telephone Encounter (Signed)
Spoke with pt relaying Dr. G's message. Pt verbalizes understanding.  

## 2020-06-28 ENCOUNTER — Other Ambulatory Visit: Payer: Self-pay

## 2020-06-28 ENCOUNTER — Ambulatory Visit: Payer: Medicare Other | Admitting: Dermatology

## 2020-06-28 DIAGNOSIS — L814 Other melanin hyperpigmentation: Secondary | ICD-10-CM

## 2020-06-28 DIAGNOSIS — L578 Other skin changes due to chronic exposure to nonionizing radiation: Secondary | ICD-10-CM

## 2020-06-28 DIAGNOSIS — L570 Actinic keratosis: Secondary | ICD-10-CM | POA: Diagnosis not present

## 2020-06-28 DIAGNOSIS — L821 Other seborrheic keratosis: Secondary | ICD-10-CM | POA: Diagnosis not present

## 2020-06-28 DIAGNOSIS — L820 Inflamed seborrheic keratosis: Secondary | ICD-10-CM

## 2020-06-28 NOTE — Patient Instructions (Signed)
Melanoma ABCDEs  Melanoma is the most dangerous type of skin cancer, and is the leading cause of death from skin disease.  You are more likely to develop melanoma if you:  Have light-colored skin, light-colored eyes, or red or blond hair  Spend a lot of time in the sun  Tan regularly, either outdoors or in a tanning bed  Have had blistering sunburns, especially during childhood  Have a close family member who has had a melanoma  Have atypical moles or large birthmarks  Early detection of melanoma is key since treatment is typically straightforward and cure rates are extremely high if we catch it early.   The first sign of melanoma is often a change in a mole or a new dark spot.  The ABCDE system is a way of remembering the signs of melanoma.  A for asymmetry:  The two halves do not match. B for border:  The edges of the growth are irregular. C for color:  A mixture of colors are present instead of an even brown color. D for diameter:  Melanomas are usually (but not always) greater than 33mm - the size of a pencil eraser. E for evolution:  The spot keeps changing in size, shape, and color.  Please check your skin once per month between visits. You can use a small mirror in front and a large mirror behind you to keep an eye on the back side or your body.   If you see any new or changing lesions before your next follow-up, please call to schedule a visit.  Please continue daily skin protection including broad spectrum sunscreen SPF 30+ to sun-exposed areas, reapplying every 2 hours as needed when you're outdoors.    Cryotherapy Aftercare  . Wash gently with soap and water everyday.   Marland Kitchen Apply Vaseline and Band-Aid daily until healed.   Seborrheic Keratosis  What causes seborrheic keratoses? Seborrheic keratoses are harmless, common skin growths that first appear during adult life.  As time goes by, more growths appear.  Some people may develop a large number of them.  Seborrheic  keratoses appear on both covered and uncovered body parts.  They are not caused by sunlight.  The tendency to develop seborrheic keratoses can be inherited.  They vary in color from skin-colored to gray, brown, or even black.  They can be either smooth or have a rough, warty surface.   Seborrheic keratoses are superficial and look as if they were stuck on the skin.  Under the microscope this type of keratosis looks like layers upon layers of skin.  That is why at times the top layer may seem to fall off, but the rest of the growth remains and re-grows.    Treatment Seborrheic keratoses do not need to be treated, but can easily be removed in the office.  Seborrheic keratoses often cause symptoms when they rub on clothing or jewelry.  Lesions can be in the way of shaving.  If they become inflamed, they can cause itching, soreness, or burning.  Removal of a seborrheic keratosis can be accomplished by freezing, burning, or surgery. If any spot bleeds, scabs, or grows rapidly, please return to have it checked, as these can be an indication of a skin cancer.

## 2020-06-28 NOTE — Progress Notes (Signed)
   Follow-Up Visit   Subjective  [name] is a 87 y.o. male who presents for the following: Follow-up (Patient here today for 4 month AK follow up. He did PDT to face in January and advises he had a very good result. He has quite a few spots he would like checked and brought a list with him. ).  The following portions of the chart were reviewed this encounter and updated as appropriate:   Tobacco  Allergies  Meds  Problems  Med Hx  Surg Hx  Fam Hx     Review of Systems:  No other skin or systemic complaints except as noted in HPI or Assessment and Plan.  Objective  Well appearing patient in no apparent distress; mood and affect are within normal limits.  A focused examination was performed including face, neck, chest and back and arms, legs. Relevant physical exam findings are noted in the Assessment and Plan.  Objective  Face and ears x 12, right forearm x 4 (16): Erythematous thin papules/macules with gritty scale.   Objective  Left Thigh x 2, right thigh x 4 (6): Erythematous keratotic or waxy stuck-on papule or plaque.    Assessment & Plan  AK (actinic keratosis) (16) Face and ears x 12, right forearm x 4  Destruction of lesion - Face and ears x 12, right forearm x 4 Complexity: simple   Destruction method: cryotherapy   Informed consent: discussed and consent obtained   Timeout:  patient name, date of birth, surgical site, and procedure verified Lesion destroyed using liquid nitrogen: Yes   Region frozen until ice ball extended beyond lesion: Yes   Outcome: patient tolerated procedure well with no complications   Post-procedure details: wound care instructions given    Inflamed seborrheic keratosis (6) Left Thigh x 2, right thigh x 4  Destruction of lesion - Left Thigh x 2, right thigh x 4 Complexity: simple   Destruction method: cryotherapy   Informed consent: discussed and consent obtained   Timeout:  patient name, date of birth, surgical site, and  procedure verified Lesion destroyed using liquid nitrogen: Yes   Region frozen until ice ball extended beyond lesion: Yes   Outcome: patient tolerated procedure well with no complications   Post-procedure details: wound care instructions given    Seborrheic Keratoses - Stuck-on, waxy, tan-brown papules and plaques  - Discussed benign etiology and prognosis. - Observe - Call for any changes  Lentigines - Scattered tan macules - Discussed due to sun exposure - Benign, observe - Recommend daily broad spectrum sunscreen SPF 30+ to sun-exposed areas, reapply every 2 hours as needed. - Call for any changes  Actinic Damage - chronic, secondary to cumulative UV radiation exposure/sun exposure over time - diffuse scaly erythematous macules with underlying dyspigmentation - Recommend daily broad spectrum sunscreen SPF 30+ to sun-exposed areas, reapply every 2 hours as needed.  - Call for new or changing lesions.  Return in about 6 months (around 12/26/2020) for AK follow up at [facility].  Graciella Belton, RMA, am acting as scribe for Sarina Ser, MD . Documentation: I have reviewed the above documentation for accuracy and completeness, and I agree with the above.  Sarina Ser, MD

## 2020-06-29 ENCOUNTER — Encounter: Payer: Self-pay | Admitting: Dermatology

## 2020-06-29 DIAGNOSIS — M955 Acquired deformity of pelvis: Secondary | ICD-10-CM | POA: Diagnosis not present

## 2020-06-29 DIAGNOSIS — M9903 Segmental and somatic dysfunction of lumbar region: Secondary | ICD-10-CM | POA: Diagnosis not present

## 2020-06-29 DIAGNOSIS — M5416 Radiculopathy, lumbar region: Secondary | ICD-10-CM | POA: Diagnosis not present

## 2020-06-29 DIAGNOSIS — M9905 Segmental and somatic dysfunction of pelvic region: Secondary | ICD-10-CM | POA: Diagnosis not present

## 2020-07-05 ENCOUNTER — Other Ambulatory Visit: Payer: Self-pay

## 2020-07-05 ENCOUNTER — Ambulatory Visit (INDEPENDENT_AMBULATORY_CARE_PROVIDER_SITE_OTHER): Payer: Medicare Other | Admitting: Family Medicine

## 2020-07-05 ENCOUNTER — Encounter: Payer: Self-pay | Admitting: Family Medicine

## 2020-07-05 VITALS — BP 140/60 | HR 76 | Temp 97.7°F | Ht 69.5 in | Wt 230.6 lb

## 2020-07-05 DIAGNOSIS — I10 Essential (primary) hypertension: Secondary | ICD-10-CM

## 2020-07-05 DIAGNOSIS — R252 Cramp and spasm: Secondary | ICD-10-CM | POA: Diagnosis not present

## 2020-07-05 DIAGNOSIS — E785 Hyperlipidemia, unspecified: Secondary | ICD-10-CM | POA: Diagnosis not present

## 2020-07-05 DIAGNOSIS — R413 Other amnesia: Secondary | ICD-10-CM | POA: Diagnosis not present

## 2020-07-05 MED ORDER — MAGNESIUM 250 MG PO TABS: 1.0000 | ORAL_TABLET | Freq: Every day | ORAL | 0 refills | Status: DC

## 2020-07-05 NOTE — Assessment & Plan Note (Addendum)
Worsening since addition of hyzaar.  He drinks tonic water regularly. Will retrial magnesium tablets.  Check Mg levels next lab.

## 2020-07-05 NOTE — Patient Instructions (Addendum)
Try restarting magnesium 250mg  once daily to see if leg cramping gets better.  Continue other medicines as blood pressures are running better. Continue aricept 5mg .  Could consider cholesterol medicine to help stabilize plaque given recent heart procedures in family (stents). Main side effect is muscle aches. Let me know if interested.  Return in 3 months for follow up visit.

## 2020-07-05 NOTE — Assessment & Plan Note (Signed)
Chronic, improved control with addition of losartan. Continue current regimen.

## 2020-07-05 NOTE — Progress Notes (Signed)
Patient ID: [name], male    DOB: August 31, 1931, 87 y.o.   MRN: [id number]  This visit was conducted in person.  BP 140/60   Pulse 76   Temp 97.7 F (36.5 C) (Temporal)   Ht 5' 9.5" (1.765 m)   Wt 230 lb 9 oz (104.6 kg)   SpO2 96%   BMI 33.56 kg/m    CC: HTN f/u visit  Subjective:   HPI: [name] is a 87 y.o. male presenting on 07/05/2020 for Hypertension (Here for 6 wk f/u.), Leg Pain (C/o bilateral leg cramps.  Started yrs ago. ), and Discuss Medication (Wants to discuss allergy meds- Allegra and Zyrtec.)   HTN - Compliant with current antihypertensive regimen of toprol XL 25mg  daily, hyzaar 50/12.5mg  daily (newest addition). Does check blood pressures at home and brings log: 120-140s/70s. No low blood pressure readings or symptoms of dizziness/syncope. Denies HA, vision changes, CP/tightness, SOB, leg swelling.   Endorses longstanding leg cramps - manages with tonic water. Notes a bit worse recently. This is despite potassium 99mg  OTC daily. We previously stopped magnesium..   We also started aricept 5mg  daily due to concern over memory deficit - tolerating well without worsening GERD or GI upset. He feels memory may have gotten better since starting. MMSE 24/30 (02/2020).   2 younger siblings s/p coronary stents in the past few months.      Relevant past medical, surgical, family and social history reviewed and updated as indicated. Interim medical history since our last visit reviewed. Allergies and medications reviewed and updated. Outpatient Medications Prior to Visit  Medication Sig Dispense Refill  . acetaminophen (TYLENOL) 500 MG tablet Take 1-2 tablets (500-1,000 mg total) by mouth 2 (two) times daily as needed for moderate pain. (Patient taking differently: Take 500-1,000 mg by mouth 2 (two) times daily as needed for moderate pain. Pt states taking 3 tablets)    . allopurinol (ZYLOPRIM) 100 MG tablet TAKE 2 TABLETS(200 MG) BY MOUTH DAILY 180 tablet 3  .  aspirin EC 81 MG tablet Take 81 mg by mouth daily.    . carboxymethylcellulose (REFRESH TEARS) 0.5 % SOLN Place 1 drop into both eyes daily as needed (dry eyes).    . cetirizine (ZYRTEC) 10 MG tablet Take 10 mg by mouth daily. Alternates weekly with allegra.    . donepezil (ARICEPT) 5 MG tablet Take 1 tablet (5 mg total) by mouth at bedtime. 30 tablet 3  . esomeprazole (NEXIUM) 20 MG capsule Take 1 capsule (20 mg total) by mouth daily at 12 noon. 90 capsule 1  . fexofenadine (ALLEGRA) 180 MG tablet Take 180 mg by mouth daily. Alternates weekly with zyrtec.    . fluticasone (FLONASE) 50 MCG/ACT nasal spray Place 2 sprays into both nostrils daily. 16 g 6  . GLUCOSAMINE-CHONDROITIN PO Take 2 tablets by mouth daily.    Marland Kitchen losartan-hydrochlorothiazide (HYZAAR) 50-12.5 MG tablet Take 1 tablet by mouth daily. 90 tablet 3  . metoprolol succinate (TOPROL-XL) 25 MG 24 hr tablet TAKE 1 TABLET BY MOUTH DAILY. TAKE WITH OR IMMEDIATELY FOLLOWING A MEAL 90 tablet 3  . Multiple Vitamins-Minerals (MULTIVITAMIN ADULT PO) Take 1 tablet by mouth daily.    . polyethylene glycol (MIRALAX / GLYCOLAX) packet Take 8.5 g by mouth daily.     . Potassium 99 MG TABS Take 99 mg by mouth daily.     No facility-administered medications prior to visit.     Per HPI unless specifically indicated in ROS  section below Review of Systems Objective:  BP 140/60   Pulse 76   Temp 97.7 F (36.5 C) (Temporal)   Ht 5' 9.5" (1.765 m)   Wt 230 lb 9 oz (104.6 kg)   SpO2 96%   BMI 33.56 kg/m   Wt Readings from Last 3 Encounters:  07/05/20 230 lb 9 oz (104.6 kg)  05/19/20 237 lb 8 oz (107.7 kg)  02/17/20 231 lb 5 oz (104.9 kg)      Physical Exam Vitals and nursing note reviewed.  Constitutional:      Appearance: Normal appearance. He is not ill-appearing.  Cardiovascular:     Rate and Rhythm: Normal rate and regular rhythm.     Pulses: Normal pulses.     Heart sounds: No murmur heard.   Pulmonary:     Effort: Pulmonary  effort is normal. No respiratory distress.     Breath sounds: Normal breath sounds. No wheezing, rhonchi or rales.  Musculoskeletal:     Right lower leg: No edema.     Left lower leg: No edema.  Skin:    General: Skin is warm and dry.  Neurological:     Mental Status: He is alert.  Psychiatric:        Mood and Affect: Mood normal.        Behavior: Behavior normal.       Results for orders placed or performed in visit on 30/16/01  Basic metabolic panel  Result Value Ref Range   Sodium 137 135 - 145 mEq/L   Potassium 4.1 3.5 - 5.1 mEq/L   Chloride 100 96 - 112 mEq/L   CO2 32 19 - 32 mEq/L   Glucose, Bld 100 (H) 70 - 99 mg/dL   BUN 20 6 - 23 mg/dL   Creatinine, Ser 1.13 0.40 - 1.50 mg/dL   GFR 58.89 (L) >60.00 mL/min   Calcium 9.8 8.4 - 10.5 mg/dL   Assessment & Plan:  This visit occurred during the SARS-CoV-2 public health emergency.  Safety protocols were in place, including screening questions prior to the visit, additional usage of staff PPE, and extensive cleaning of exam room while observing appropriate contact time as indicated for disinfecting solutions.   Problem List Items Addressed This Visit    Memory deficit    He feels aricept has been beneficial but declines increase in dose. Will continue to monitor.       Leg cramps    Worsening since addition of hyzaar.  He drinks tonic water regularly. Will retrial magnesium tablets.  Check Mg levels next lab.      HTN (hypertension) - Primary    Chronic, improved control with addition of losartan. Continue current regimen.       Dyslipidemia    Now with new fmhx CAD (albeit in 58yo siblings) pt asks about preventative options. Known carotid stenosis, presume also with CAD (asxs). At age 56yo still hesitant to add statin given myalgia/memory concerns but discussed this would be an option. He will consider and let me know if interested.  The ASCVD Risk score [name] DC Jr., et al., 2013) failed to calculate for the  following reasons:   The 2013 ASCVD risk score is only valid for ages 59 to 4           Meds ordered this encounter  Medications  . Magnesium 250 MG TABS    Sig: Take 1 tablet (250 mg total) by mouth daily.    Refill:  0   No  orders of the defined types were placed in this encounter.   Patient Instructions  Try restarting magnesium 250mg  once daily to see if leg cramping gets better.  Continue other medicines as blood pressures are running better. Continue aricept 5mg .  Could consider cholesterol medicine to help stabilize plaque given recent heart procedures in family (stents). Main side effect is muscle aches. Let me know if interested.  Return in 3 months for follow up visit.    Follow up plan: Return in about 3 months (around 10/02/2020), or if symptoms worsen or fail to improve, for follow up visit.  Ria Bush, MD

## 2020-07-05 NOTE — Assessment & Plan Note (Addendum)
Now with new fmhx CAD (albeit in 85yo siblings) pt asks about preventative options. Known carotid stenosis, presume also with CAD (asxs). At age 43yo still hesitant to add statin given myalgia/memory concerns but discussed this would be an option. He will consider and let me know if interested.  The ASCVD Risk score [name] DC Jr., et al., 2013) failed to calculate for the following reasons:   The 2013 ASCVD risk score is only valid for ages 16 to 62

## 2020-07-05 NOTE — Assessment & Plan Note (Signed)
He feels aricept has been beneficial but declines increase in dose. Will continue to monitor.

## 2020-07-19 DIAGNOSIS — M9905 Segmental and somatic dysfunction of pelvic region: Secondary | ICD-10-CM | POA: Diagnosis not present

## 2020-07-19 DIAGNOSIS — M9903 Segmental and somatic dysfunction of lumbar region: Secondary | ICD-10-CM | POA: Diagnosis not present

## 2020-07-19 DIAGNOSIS — M955 Acquired deformity of pelvis: Secondary | ICD-10-CM | POA: Diagnosis not present

## 2020-07-19 DIAGNOSIS — M5416 Radiculopathy, lumbar region: Secondary | ICD-10-CM | POA: Diagnosis not present

## 2020-07-20 DIAGNOSIS — I10 Essential (primary) hypertension: Secondary | ICD-10-CM | POA: Diagnosis not present

## 2020-07-20 DIAGNOSIS — G4733 Obstructive sleep apnea (adult) (pediatric): Secondary | ICD-10-CM | POA: Diagnosis not present

## 2020-08-11 ENCOUNTER — Other Ambulatory Visit: Payer: Self-pay | Admitting: Family Medicine

## 2020-08-11 DIAGNOSIS — M9903 Segmental and somatic dysfunction of lumbar region: Secondary | ICD-10-CM | POA: Diagnosis not present

## 2020-08-11 DIAGNOSIS — M9905 Segmental and somatic dysfunction of pelvic region: Secondary | ICD-10-CM | POA: Diagnosis not present

## 2020-08-11 DIAGNOSIS — M5416 Radiculopathy, lumbar region: Secondary | ICD-10-CM | POA: Diagnosis not present

## 2020-08-11 DIAGNOSIS — M955 Acquired deformity of pelvis: Secondary | ICD-10-CM | POA: Diagnosis not present

## 2020-08-31 DIAGNOSIS — M5416 Radiculopathy, lumbar region: Secondary | ICD-10-CM | POA: Diagnosis not present

## 2020-08-31 DIAGNOSIS — M9905 Segmental and somatic dysfunction of pelvic region: Secondary | ICD-10-CM | POA: Diagnosis not present

## 2020-08-31 DIAGNOSIS — M955 Acquired deformity of pelvis: Secondary | ICD-10-CM | POA: Diagnosis not present

## 2020-08-31 DIAGNOSIS — M9903 Segmental and somatic dysfunction of lumbar region: Secondary | ICD-10-CM | POA: Diagnosis not present

## 2020-09-06 ENCOUNTER — Ambulatory Visit: Payer: Medicare Other | Admitting: Family Medicine

## 2020-09-07 DIAGNOSIS — J019 Acute sinusitis, unspecified: Secondary | ICD-10-CM | POA: Diagnosis not present

## 2020-09-13 ENCOUNTER — Ambulatory Visit (INDEPENDENT_AMBULATORY_CARE_PROVIDER_SITE_OTHER): Payer: Medicare Other | Admitting: Family Medicine

## 2020-09-13 ENCOUNTER — Encounter: Payer: Self-pay | Admitting: Family Medicine

## 2020-09-13 ENCOUNTER — Telehealth: Payer: Self-pay

## 2020-09-13 ENCOUNTER — Other Ambulatory Visit: Payer: Self-pay | Admitting: Family Medicine

## 2020-09-13 ENCOUNTER — Other Ambulatory Visit: Payer: Self-pay

## 2020-09-13 VITALS — BP 160/70 | HR 70 | Temp 97.7°F | Ht 69.5 in | Wt 219.4 lb

## 2020-09-13 DIAGNOSIS — I10 Essential (primary) hypertension: Secondary | ICD-10-CM | POA: Diagnosis not present

## 2020-09-13 DIAGNOSIS — R252 Cramp and spasm: Secondary | ICD-10-CM | POA: Diagnosis not present

## 2020-09-13 DIAGNOSIS — K219 Gastro-esophageal reflux disease without esophagitis: Secondary | ICD-10-CM | POA: Diagnosis not present

## 2020-09-13 MED ORDER — METOPROLOL SUCCINATE ER 25 MG PO TB24
ORAL_TABLET | ORAL | 0 refills | Status: DC
Start: 2020-09-13 — End: 2020-10-29

## 2020-09-13 MED ORDER — LOSARTAN POTASSIUM 100 MG PO TABS: 100.0000 mg | ORAL_TABLET | Freq: Every day | ORAL | 6 refills | Status: DC

## 2020-09-13 NOTE — Telephone Encounter (Addendum)
Pt reports he is having cramps in legs and hands also BP has been elevated 156 79 last checked Friday  Pt is upset that he was not able to be seen for SinuS Sx and cough and went to UC 5/3 prescribed Augmentin and Tessalon and still has a dose left... Covid test was neg...  You do not have any openings and patient is demanding that he sees you

## 2020-09-13 NOTE — Telephone Encounter (Signed)
Will get front office to add pt.

## 2020-09-13 NOTE — Telephone Encounter (Addendum)
May place today at 4:30pm if respiratory symptoms are improving. I have ordered labs that can be done when he gets here.

## 2020-09-13 NOTE — Patient Instructions (Addendum)
Labs today.  Goal blood pressure is <140/90.  Yours was too high today and is staying elevated at home.  Stop hyzaar (combination losartan hctz).  In its place start plain losartan 100mg  daily.  Continue toprol XL 25mg  daily.  Schedule follow up visit in 4-6 weeks.  This should hopefully help cramping as well.  Stop omeprazole, only take esomeprazole 20mg  daily for reflux.

## 2020-09-13 NOTE — Assessment & Plan Note (Signed)
Had been taking double PPI - will change to only esomeprazole 20mg  daily.

## 2020-09-13 NOTE — Assessment & Plan Note (Addendum)
Chronic, deteriorated control in office, also noted on home readings. Will change hyzaar to plain losartan 100mg  daily.  Advised continue monitoring readings at home and let me know effect of med changes. As we're stopping HCTZ, may need to come off OTC potassium - will await labwork.

## 2020-09-13 NOTE — Progress Notes (Signed)
Patient ID: [name], male    DOB: [birth year], 87 y.o.   MRN: [id number]  This visit was conducted in person.  BP (!) 160/70 (Cuff Size: Large)   Pulse 70   Temp 97.7 F (36.5 C) (Temporal)   Ht 5' 9.5" (1.765 m)   Wt 219 lb 7 oz (99.5 kg)   SpO2 98%   BMI 31.94 kg/m    CC: leg pain  Subjective:   HPI: [name] is a 87 y.o. male presenting on 09/13/2020 for Leg Pain (C/o bilateral leg and hand pain.  Started about 4 wks ago after med change.  Described as cramps.  Takes OTC Leg Cramps supplement, very helpful. )   See prior note for details. Recent addition of hyzaar 50/12.5mg  for uncontrolled hypertension with improvement. He is also on toprol XL 25mg  daily. Known longstanding leg cramping, managed with tonic water. Some worsening since hctz addition. We restarted magnesium 250mg  OTC daily.   Notes worsening leg and hand cramps, much worse a few days ago.  He's been taking leg cramp homeopathic pills with benefit.  Home BP readings running elevated at home - 162/82, 152/82.   He has restarted working at [employer] - on his feet at work.   Recent sinus infection seen at [name] treated with augmentin course. Symptoms improving.   Med rec performed - he was taking both omeprazole and esomeprazole.      Relevant past medical, surgical, family and social history reviewed and updated as indicated. Interim medical history since our last visit reviewed. Allergies and medications reviewed and updated. Outpatient Medications Prior to Visit  Medication Sig Dispense Refill  . acetaminophen (TYLENOL) 500 MG tablet Take 1-2 tablets (500-1,000 mg total) by mouth 2 (two) times daily as needed for moderate pain. (Patient taking differently: Take 500-1,000 mg by mouth 2 (two) times daily as needed for moderate pain. Pt states taking 3 tablets)    . allopurinol (ZYLOPRIM) 100 MG tablet TAKE 2 TABLETS(200 MG) BY MOUTH DAILY 180 tablet 3  . amoxicillin-clavulanate (AUGMENTIN) 875-125  MG tablet SMARTSIG:1 Tablet(s) By Mouth Morning-Evening    . amoxicillin-clavulanate (AUGMENTIN) 875-125 MG tablet Take by mouth.    Marland Kitchen aspirin EC 81 MG tablet Take 81 mg by mouth daily.    . carboxymethylcellulose (REFRESH TEARS) 0.5 % SOLN Place 1 drop into both eyes daily as needed (dry eyes).    . cetirizine (ZYRTEC) 10 MG tablet Take 10 mg by mouth daily. Alternates weekly with allegra.    . donepezil (ARICEPT) 5 MG tablet Take 1 tablet (5 mg total) by mouth at bedtime. 30 tablet 3  . esomeprazole (NEXIUM) 20 MG capsule TAKE ONE CAPSULE BY MOUTH DAILY AT 12 NOON 90 capsule 1  . fexofenadine (ALLEGRA) 180 MG tablet Take 180 mg by mouth daily. Alternates weekly with zyrtec.    . fluticasone (FLONASE) 50 MCG/ACT nasal spray Place 2 sprays into both nostrils daily. 16 g 6  . GLUCOSAMINE-CHONDROITIN PO Take 2 tablets by mouth daily.    . Magnesium 250 MG TABS Take 1 tablet (250 mg total) by mouth daily.  0  . Multiple Vitamins-Minerals (MULTIVITAMIN ADULT PO) Take 1 tablet by mouth daily.    . polyethylene glycol (MIRALAX / GLYCOLAX) packet Take 8.5 g by mouth daily.     . Potassium 99 MG TABS Take 99 mg by mouth daily.    Marland Kitchen losartan-hydrochlorothiazide (HYZAAR) 50-12.5 MG tablet Take 1 tablet by mouth daily. 90 tablet 3  .  metoprolol succinate (TOPROL-XL) 25 MG 24 hr tablet TAKE 1 TABLET BY MOUTH DAILY. TAKE WITH OR IMMEDIATELY FOLLOWING A MEAL (Patient not taking: Reported on 09/13/2020) 90 tablet 3   No facility-administered medications prior to visit.     Per HPI unless specifically indicated in ROS section below Review of Systems Objective:  BP (!) 160/70 (Cuff Size: Large)   Pulse 70   Temp 97.7 F (36.5 C) (Temporal)   Ht 5' 9.5" (1.765 m)   Wt 219 lb 7 oz (99.5 kg)   SpO2 98%   BMI 31.94 kg/m   Wt Readings from Last 3 Encounters:  09/13/20 219 lb 7 oz (99.5 kg)  07/05/20 230 lb 9 oz (104.6 kg)  05/19/20 237 lb 8 oz (107.7 kg)      Physical Exam Vitals and nursing note  reviewed.  Constitutional:      Appearance: Normal appearance. He is not ill-appearing.  Cardiovascular:     Rate and Rhythm: Normal rate and regular rhythm.     Pulses: Normal pulses.     Heart sounds: Normal heart sounds. No murmur heard.   Pulmonary:     Effort: Pulmonary effort is normal. No respiratory distress.     Breath sounds: Normal breath sounds. No wheezing, rhonchi or rales.  Musculoskeletal:        General: No swelling or tenderness.     Right lower leg: No edema.     Left lower leg: No edema.  Skin:    General: Skin is warm and dry.     Findings: No rash.  Neurological:     Mental Status: He is alert.  Psychiatric:        Mood and Affect: Mood normal.        Behavior: Behavior normal.       Results for orders placed or performed in visit on 54/27/06  Basic metabolic panel  Result Value Ref Range   Sodium 137 135 - 145 mEq/L   Potassium 4.1 3.5 - 5.1 mEq/L   Chloride 100 96 - 112 mEq/L   CO2 32 19 - 32 mEq/L   Glucose, Bld 100 (H) 70 - 99 mg/dL   BUN 20 6 - 23 mg/dL   Creatinine, Ser 1.13 0.40 - 1.50 mg/dL   GFR 58.89 (L) >60.00 mL/min   Calcium 9.8 8.4 - 10.5 mg/dL   Assessment & Plan:  This visit occurred during the SARS-CoV-2 public health emergency.  Safety protocols were in place, including screening questions prior to the visit, additional usage of staff PPE, and extensive cleaning of exam room while observing appropriate contact time as indicated for disinfecting solutions.   Problem List Items Addressed This Visit    GERD (gastroesophageal reflux disease)    Had been taking double PPI - will change to only esomeprazole 20mg  daily.       HTN (hypertension)    Chronic, deteriorated control in office, also noted on home readings. Will change hyzaar to plain losartan 100mg  daily.  Advised continue monitoring readings at home and let me know effect of med changes. As we're stopping HCTZ, may need to come off OTC potassium - will await labwork.        Relevant Medications   losartan (COZAAR) 100 MG tablet   metoprolol succinate (TOPROL-XL) 25 MG 24 hr tablet   Limb cramps - Primary    Acutely worse hand and leg cramps over the last few weeks.  He brings meds which were reconciled - he had been  taking both omeprazole 20mg  and esomeprazole 20mg  daily - advised stop omeprazole as double PPI could be contributing to this. Update if not improved after this.  Will also check labwork including K, Mg, CK to eval for other causes of cramping.           Meds ordered this encounter  Medications  . losartan (COZAAR) 100 MG tablet    Sig: Take 1 tablet (100 mg total) by mouth daily.    Dispense:  30 tablet    Refill:  6    To replace hyzaar (losartan hctz 50/12.5mg )  . metoprolol succinate (TOPROL-XL) 25 MG 24 hr tablet    Sig: TAKE 1 TABLET BY MOUTH DAILY. TAKE WITH OR IMMEDIATELY FOLLOWING A MEAL    Dispense:  90 tablet    Refill:  0   No orders of the defined types were placed in this encounter.   Patient Instructions  Labs today.  Goal blood pressure is <140/90.  Yours was too high today and is staying elevated at home.  Stop hyzaar (combination losartan hctz).  In its place start plain losartan 100mg  daily.  Continue toprol XL 25mg  daily.  Schedule follow up visit in 4-6 weeks.  This should hopefully help cramping as well.  Stop omeprazole, only take esomeprazole 20mg  daily for reflux.   Follow up plan: Return in about 4 weeks (around 10/11/2020) for follow up visit.  Ria Bush, MD

## 2020-09-13 NOTE — Assessment & Plan Note (Addendum)
Acutely worse hand and leg cramps over the last few weeks.  He brings meds which were reconciled - he had been taking both omeprazole 20mg  and esomeprazole 20mg  daily - advised stop omeprazole as double PPI could be contributing to this. Update if not improved after this.  Will also check labwork including K, Mg, CK to eval for other causes of cramping.

## 2020-09-13 NOTE — Addendum Note (Signed)
Addended by: Ria Bush on: 09/13/2020 01:35 PM   Modules accepted: Orders

## 2020-09-14 LAB — CBC WITH DIFFERENTIAL/PLATELET
Basophils Absolute: 0 10*3/uL (ref 0.0–0.1)
Basophils Relative: 0.8 % (ref 0.0–3.0)
Eosinophils Absolute: 0.2 10*3/uL (ref 0.0–0.7)
Eosinophils Relative: 3.3 % (ref 0.0–5.0)
HCT: 36.6 % — ABNORMAL LOW (ref 39.0–52.0)
Hemoglobin: 12.5 g/dL — ABNORMAL LOW (ref 13.0–17.0)
Lymphocytes Relative: 32.6 % (ref 12.0–46.0)
Lymphs Abs: 1.5 10*3/uL (ref 0.7–4.0)
MCHC: 34.2 g/dL (ref 30.0–36.0)
MCV: 95.4 fl (ref 78.0–100.0)
Monocytes Absolute: 0.4 10*3/uL (ref 0.1–1.0)
Monocytes Relative: 9.6 % (ref 3.0–12.0)
Neutro Abs: 2.5 10*3/uL (ref 1.4–7.7)
Neutrophils Relative %: 53.7 % (ref 43.0–77.0)
Platelets: 178 10*3/uL (ref 150.0–400.0)
RBC: 3.84 Mil/uL — ABNORMAL LOW (ref 4.22–5.81)
RDW: 14.4 % (ref 11.5–15.5)
WBC: 4.6 10*3/uL (ref 4.0–10.5)

## 2020-09-14 LAB — MAGNESIUM: Magnesium: 2 mg/dL (ref 1.5–2.5)

## 2020-09-14 LAB — COMPREHENSIVE METABOLIC PANEL
ALT: 24 U/L (ref 0–53)
AST: 29 U/L (ref 0–37)
Albumin: 3.9 g/dL (ref 3.5–5.2)
Alkaline Phosphatase: 55 U/L (ref 39–117)
BUN: 18 mg/dL (ref 6–23)
CO2: 28 mEq/L (ref 19–32)
Calcium: 8.8 mg/dL (ref 8.4–10.5)
Chloride: 102 mEq/L (ref 96–112)
Creatinine, Ser: 1.09 mg/dL (ref 0.40–1.50)
GFR: 61.36 mL/min (ref >60.00)
Glucose, Bld: 105 mg/dL — ABNORMAL HIGH (ref 70–99)
Potassium: 3.5 mEq/L (ref 3.5–5.1)
Sodium: 138 mEq/L (ref 135–145)
Total Bilirubin: 0.4 mg/dL (ref 0.2–1.2)
Total Protein: 6.8 g/dL (ref 6.0–8.3)

## 2020-09-14 LAB — CK: Total CK: 156 U/L (ref 7–232)

## 2020-09-15 ENCOUNTER — Ambulatory Visit: Payer: Medicare Other | Admitting: Family Medicine

## 2020-09-21 DIAGNOSIS — M9905 Segmental and somatic dysfunction of pelvic region: Secondary | ICD-10-CM | POA: Diagnosis not present

## 2020-09-21 DIAGNOSIS — M9903 Segmental and somatic dysfunction of lumbar region: Secondary | ICD-10-CM | POA: Diagnosis not present

## 2020-09-21 DIAGNOSIS — M955 Acquired deformity of pelvis: Secondary | ICD-10-CM | POA: Diagnosis not present

## 2020-09-21 DIAGNOSIS — M5416 Radiculopathy, lumbar region: Secondary | ICD-10-CM | POA: Diagnosis not present

## 2020-09-27 ENCOUNTER — Telehealth: Payer: Self-pay

## 2020-09-27 NOTE — Telephone Encounter (Signed)
Lvm asking pt to call back.  Need to relay results and Dr. Synthia Innocent message.  Labs/Dr. Synthia Innocent msg: Plz notify labs returned overall stable only showing mild anemia. Hopefully only taking 1 reflux medicine (instead of 2) has helped muscle cramps he was having.

## 2020-09-27 NOTE — Telephone Encounter (Signed)
Pt returning call.  I relayed results.  Pt verbalizes understanding.  States taking the 1 reflux med has stopped the muscle cramps.

## 2020-10-12 ENCOUNTER — Other Ambulatory Visit: Payer: Self-pay

## 2020-10-12 ENCOUNTER — Ambulatory Visit: Payer: Medicare Other | Admitting: Family Medicine

## 2020-10-12 ENCOUNTER — Encounter: Payer: Self-pay | Admitting: Family Medicine

## 2020-10-12 ENCOUNTER — Ambulatory Visit (INDEPENDENT_AMBULATORY_CARE_PROVIDER_SITE_OTHER): Payer: Medicare Other | Admitting: Family Medicine

## 2020-10-12 VITALS — BP 126/66 | HR 52 | Temp 97.9°F | Ht 69.5 in | Wt 222.2 lb

## 2020-10-12 DIAGNOSIS — J309 Allergic rhinitis, unspecified: Secondary | ICD-10-CM | POA: Insufficient documentation

## 2020-10-12 DIAGNOSIS — I10 Essential (primary) hypertension: Secondary | ICD-10-CM

## 2020-10-12 DIAGNOSIS — J3089 Other allergic rhinitis: Secondary | ICD-10-CM

## 2020-10-12 DIAGNOSIS — R252 Cramp and spasm: Secondary | ICD-10-CM

## 2020-10-12 MED ORDER — AMLODIPINE BESYLATE 2.5 MG PO TABS: 2.5000 mg | ORAL_TABLET | Freq: Every day | ORAL | 6 refills | Status: DC

## 2020-10-12 MED ORDER — AZELASTINE HCL 0.1 % NA SOLN: 1.0000 | Freq: Two times a day (BID) | NASAL | 6 refills | Status: DC

## 2020-10-12 NOTE — Assessment & Plan Note (Signed)
This improved after he stopped taking 2 PPIs at once and with stopping hctz.

## 2020-10-12 NOTE — Progress Notes (Signed)
Patient ID: [name], male    DOB: [birth year], 87 y.o.   MRN: [id number]  This visit was conducted in person.  BP 126/66 (BP Location: Right Arm, Cuff Size: Large)   Pulse (!) 52   Temp 97.9 F (36.6 C) (Temporal)   Ht 5' 9.5" (1.765 m)   Wt 222 lb 3 oz (100.8 kg)   SpO2 97%   BMI 32.34 kg/m    CC: 3 mo f/u visit  Subjective:   HPI: [name] is a 87 y.o. male presenting on 10/12/2020 for Hypertension (Here for 4 wk f/u.) and Allergies (Wants to discuss other tx options for seasonal allergies.  Currently using Flonase and alternating Allegra and Zrytec. )   Remote history of laryngeal cancer.   Muscle cramps are significantly improved. Thinks red wine may have precipitated cramping episode.  Enjoys work as Systems analyst.   HTN - Compliant with current antihypertensive regimen of losartan 100mg  daily, toprol XL 25mg  daily. Does check blood pressures at home: 150/60s. No low blood pressure readings or symptoms of dizziness/syncope. Denies HA, vision changes, CP/tightness, SOB, leg swelling.   Seasonal allergic rhinitis - ongoing symptoms despite flonase + allegra or zyrtec. Constant nose running of clear watery discharge, worse in the spring. Predominant rhinorrhea, not so much congestion.      Relevant past medical, surgical, family and social history reviewed and updated as indicated. Interim medical history since our last visit reviewed. Allergies and medications reviewed and updated. Outpatient Medications Prior to Visit  Medication Sig Dispense Refill  . acetaminophen (TYLENOL) 500 MG tablet Take 1-2 tablets (500-1,000 mg total) by mouth 2 (two) times daily as needed for moderate pain. (Patient taking differently: Take 500-1,000 mg by mouth 2 (two) times daily as needed for moderate pain. Pt states taking 3 tablets)    . allopurinol (ZYLOPRIM) 100 MG tablet TAKE 2 TABLETS(200 MG) BY MOUTH DAILY 180 tablet 3  . aspirin EC 81 MG tablet Take 81 mg by  mouth daily.    . carboxymethylcellulose (REFRESH TEARS) 0.5 % SOLN Place 1 drop into both eyes daily as needed (dry eyes).    . cetirizine (ZYRTEC) 10 MG tablet Take 10 mg by mouth daily. Alternates weekly with allegra.    . donepezil (ARICEPT) 5 MG tablet TAKE 1 TABLET(5 MG) BY MOUTH AT BEDTIME 90 tablet 1  . esomeprazole (NEXIUM) 20 MG capsule TAKE ONE CAPSULE BY MOUTH DAILY AT 12 NOON 90 capsule 1  . fexofenadine (ALLEGRA) 180 MG tablet Take 180 mg by mouth daily. Alternates weekly with zyrtec.    . fluticasone (FLONASE) 50 MCG/ACT nasal spray Place 2 sprays into both nostrils daily. 16 g 6  . GLUCOSAMINE-CHONDROITIN PO Take 2 tablets by mouth daily.    Marland Kitchen losartan (COZAAR) 100 MG tablet Take 1 tablet (100 mg total) by mouth daily. 30 tablet 6  . Magnesium 250 MG TABS Take 1 tablet (250 mg total) by mouth daily.  0  . metoprolol succinate (TOPROL-XL) 25 MG 24 hr tablet TAKE 1 TABLET BY MOUTH DAILY. TAKE WITH OR IMMEDIATELY FOLLOWING A MEAL 90 tablet 0  . Multiple Vitamins-Minerals (MULTIVITAMIN ADULT PO) Take 1 tablet by mouth daily.    . polyethylene glycol (MIRALAX / GLYCOLAX) packet Take 8.5 g by mouth daily.     . Potassium 99 MG TABS Take 99 mg by mouth daily.    Marland Kitchen amoxicillin-clavulanate (AUGMENTIN) 875-125 MG tablet SMARTSIG:1 Tablet(s) By Mouth Morning-Evening     No  facility-administered medications prior to visit.     Per HPI unless specifically indicated in ROS section below Review of Systems Objective:  BP 126/66 (BP Location: Right Arm, Cuff Size: Large)   Pulse (!) 52   Temp 97.9 F (36.6 C) (Temporal)   Ht 5' 9.5" (1.765 m)   Wt 222 lb 3 oz (100.8 kg)   SpO2 97%   BMI 32.34 kg/m   Wt Readings from Last 3 Encounters:  10/12/20 222 lb 3 oz (100.8 kg)  09/13/20 219 lb 7 oz (99.5 kg)  07/05/20 230 lb 9 oz (104.6 kg)      Physical Exam Vitals and nursing note reviewed.  Constitutional:      Appearance: Normal appearance. He is not ill-appearing.  HENT:     Head:  Normocephalic and atraumatic.     Nose: Mucosal edema present. No congestion or rhinorrhea.     Right Turbinates: Pale. Not enlarged.     Left Turbinates: Pale. Not enlarged.     Mouth/Throat:     Mouth: Mucous membranes are moist.     Pharynx: Oropharynx is clear. No oropharyngeal exudate or posterior oropharyngeal erythema.  Eyes:     Extraocular Movements: Extraocular movements intact.     Pupils: Pupils are equal, round, and reactive to light.  Cardiovascular:     Rate and Rhythm: Normal rate and regular rhythm.     Pulses: Normal pulses.     Heart sounds: Normal heart sounds. No murmur heard.   Pulmonary:     Effort: Pulmonary effort is normal. No respiratory distress.     Breath sounds: Normal breath sounds. No wheezing, rhonchi or rales.  Musculoskeletal:     Right lower leg: No edema.     Left lower leg: No edema.  Skin:    General: Skin is warm and dry.     Findings: No rash.  Neurological:     Mental Status: He is alert.  Psychiatric:        Mood and Affect: Mood normal.        Behavior: Behavior normal.       Assessment & Plan:  This visit occurred during the SARS-CoV-2 public health emergency.  Safety protocols were in place, including screening questions prior to the visit, additional usage of staff PPE, and extensive cleaning of exam room while observing appropriate contact time as indicated for disinfecting solutions.   Problem List Items Addressed This Visit    HTN (hypertension) - Primary    Chronic, initially elevated however improved on my recheck. Continue current regimen. HCTZ may have caused worsening leg cramping.       Limb cramps    This improved after he stopped taking 2 PPIs at once and with stopping hctz.       Allergic rhinitis    Perennial, rhinorrhea > congestion, breakthrough symptoms despite regular use of antihistamine and flonase. Will add astelin nasal spray. He will continue alternating antihistamine each week between zyrtec and  allegra.           Meds ordered this encounter  Medications  . azelastine (ASTELIN) 0.1 % nasal spray    Sig: Place 1-2 sprays into both nostrils 2 (two) times daily. Use in each nostril as directed    Dispense:  30 mL    Refill:  6  . DISCONTD: amLODipine (NORVASC) 2.5 MG tablet    Sig: Take 1 tablet (2.5 mg total) by mouth daily.    Dispense:  30 tablet  Refill:  6   No orders of the defined types were placed in this encounter.   Patient Instructions  For allergies - continue current regimen, add astelin nasal spray twice daily (1-2 sprays).  Blood pressures are looking better today. Bring in home blood pressure cuff to next visit to compare.  Return after October 12th for physical/wellness visit    Follow up plan: Return in about 4 months (around 02/11/2021) for annual exam, prior fasting for blood work, [employer] wellness visit.  Ria Bush, MD

## 2020-10-12 NOTE — Patient Instructions (Addendum)
For allergies - continue current regimen, add astelin nasal spray twice daily (1-2 sprays).  Blood pressures are looking better today. Bring in home blood pressure cuff to next visit to compare.  Return after October 12th for physical/wellness visit

## 2020-10-12 NOTE — Assessment & Plan Note (Addendum)
Chronic, initially elevated however improved on my recheck. Continue current regimen. HCTZ may have caused worsening leg cramping.

## 2020-10-12 NOTE — Assessment & Plan Note (Signed)
Perennial, rhinorrhea > congestion, breakthrough symptoms despite regular use of antihistamine and flonase. Will add astelin nasal spray. He will continue alternating antihistamine each week between zyrtec and allegra.

## 2020-10-13 DIAGNOSIS — M9905 Segmental and somatic dysfunction of pelvic region: Secondary | ICD-10-CM | POA: Diagnosis not present

## 2020-10-13 DIAGNOSIS — M955 Acquired deformity of pelvis: Secondary | ICD-10-CM | POA: Diagnosis not present

## 2020-10-13 DIAGNOSIS — M9903 Segmental and somatic dysfunction of lumbar region: Secondary | ICD-10-CM | POA: Diagnosis not present

## 2020-10-13 DIAGNOSIS — M5416 Radiculopathy, lumbar region: Secondary | ICD-10-CM | POA: Diagnosis not present

## 2020-10-29 ENCOUNTER — Other Ambulatory Visit: Payer: Self-pay | Admitting: Family Medicine

## 2020-11-02 DIAGNOSIS — M9903 Segmental and somatic dysfunction of lumbar region: Secondary | ICD-10-CM | POA: Diagnosis not present

## 2020-11-02 DIAGNOSIS — M955 Acquired deformity of pelvis: Secondary | ICD-10-CM | POA: Diagnosis not present

## 2020-11-02 DIAGNOSIS — M5416 Radiculopathy, lumbar region: Secondary | ICD-10-CM | POA: Diagnosis not present

## 2020-11-02 DIAGNOSIS — M9905 Segmental and somatic dysfunction of pelvic region: Secondary | ICD-10-CM | POA: Diagnosis not present

## 2020-11-22 DIAGNOSIS — G4733 Obstructive sleep apnea (adult) (pediatric): Secondary | ICD-10-CM | POA: Diagnosis not present

## 2020-11-22 DIAGNOSIS — I10 Essential (primary) hypertension: Secondary | ICD-10-CM | POA: Diagnosis not present

## 2020-11-23 DIAGNOSIS — M5416 Radiculopathy, lumbar region: Secondary | ICD-10-CM | POA: Diagnosis not present

## 2020-11-23 DIAGNOSIS — M955 Acquired deformity of pelvis: Secondary | ICD-10-CM | POA: Diagnosis not present

## 2020-11-23 DIAGNOSIS — M9903 Segmental and somatic dysfunction of lumbar region: Secondary | ICD-10-CM | POA: Diagnosis not present

## 2020-11-23 DIAGNOSIS — M9905 Segmental and somatic dysfunction of pelvic region: Secondary | ICD-10-CM | POA: Diagnosis not present

## 2020-12-01 ENCOUNTER — Encounter: Payer: Self-pay | Admitting: Family Medicine

## 2020-12-01 ENCOUNTER — Ambulatory Visit: Payer: Medicare Other | Admitting: Family Medicine

## 2020-12-01 VITALS — BP 166/62 | HR 58 | Ht 69.75 in | Wt 224.4 lb

## 2020-12-01 DIAGNOSIS — Z9989 Dependence on other enabling machines and devices: Secondary | ICD-10-CM

## 2020-12-01 DIAGNOSIS — G4733 Obstructive sleep apnea (adult) (pediatric): Secondary | ICD-10-CM

## 2020-12-01 NOTE — Progress Notes (Addendum)
PATIENT: [name] DOB: [birth year]  REASON FOR VISIT: follow up HISTORY FROM: patient  Chief Complaint  Patient presents with   Obstructive Sleep Apnea    New rm, alone. Here for 1 year cpap f/u, pt reports doing well with cpap therapy. No issues or concern.      HISTORY OF PRESENT ILLNESS: 12/01/2020 ALL:  [name] returns for follow up for OSA on CPAP. He continues to do very well on CPAP therapy. He is using CPAP every night for about 8 hours a night. He can not sleep without it. He has returned to work. He is working 5 hours a day 5 days a week at [employer] in [employer]. He is loving his job and enjoys being active.     09/04/2019 ALL:  [name] is a 87 y.o. male here today for follow up for OSA on CPAP.  He is doing very well today and without complaints.  He continues CPAP therapy every night.  He states that he cannot sleep without it.  He is followed closely by PCP.  Blood pressure is 160/65 today.  He reports that at his CPE 3 weeks ago it was normal.  He does not routinely check at home.  He is asymptomatic today.  He does take metoprolol as prescribed.  Compliance report dated 08/04/2019 through 09/02/2019 reveals that he has used CPAP therapy every night.  He has used CPAP greater than 4 hours every night.  100% compliance on close.  Average usage is 8 hours and 33 minutes.  Residual AHI is 2.3 on 10 cm of water and EPR of 1.  There is no significant leak noted.  HISTORY: (copied from [place] note on 08/29/2017)  [name] is an 87 year old male with a history of obstructive sleep apnea on CPAP.  His download indicates that he use his machine nightly for compliance of 100%.  Every night he uses machine greater than 4 hours.  On average he uses his machine 8 hours and 56 minutes.  His residual AHI is 2.6 on 10 cm of water with EPR of 1.  He does not have a significant leak.  He states that since he is been on 10 cm of water he  feels that he wakes up more.  He also states that his wife has noted that he is snoring.  He denies any significant daytime sleepiness.  He returns today for an evaluation.   HISTORY 01/23/2017 (Copied from Dr. Guadelupe Robles note):  I reviewed his CPAP compliance data from 12/23/2016 through 01/21/2017 which is a total of 30 days, during which time he used his machine 29 days with percent used days greater than 4 hours at 97%, indicating excellent compliance with an average usage of 9 hours and 35 minutes, residual AHI at goal at 2.2 per hour, leak acceptable with the 95th percentile at 16.1 L/m on a pressure of 10 cm with EPR. He reports, he occasional wakes up with a dull headache. He limits his water intake after 8 PM. He does like to drink wine, wife gives him a hard time if he drinks more than 3 glasses. He is using a medium Eson nasal mask. Has occasional leg cramps, but tonic water helps, no actual RLS symptoms.    The patient's allergies, current medications, family history, past medical history, past social history, past surgical history and problem list were reviewed and updated as appropriate.     REVIEW OF SYSTEMS: Out of  a complete 14 system review of symptoms, the patient complains only of the following symptoms, none and all other reviewed systems are negative.  ESS: 9  ALLERGIES: Allergies  Allergen Reactions   Lisinopril Cough    HOME MEDICATIONS: Outpatient Medications Prior to Visit  Medication Sig Dispense Refill   acetaminophen (TYLENOL) 500 MG tablet Take 1-2 tablets (500-1,000 mg total) by mouth 2 (two) times daily as needed for moderate pain. (Patient taking differently: Take 500-1,000 mg by mouth 2 (two) times daily as needed for moderate pain. Pt states taking 3 tablets)     allopurinol (ZYLOPRIM) 100 MG tablet TAKE 2 TABLETS(200 MG) BY MOUTH DAILY 180 tablet 3   aspirin EC 81 MG tablet Take 81 mg by mouth daily.     azelastine (ASTELIN) 0.1 % nasal spray Place 1-2 sprays  into both nostrils 2 (two) times daily. Use in each nostril as directed 30 mL 6   carboxymethylcellulose (REFRESH TEARS) 0.5 % SOLN Place 1 drop into both eyes daily as needed (dry eyes).     cetirizine (ZYRTEC) 10 MG tablet Take 10 mg by mouth daily. Alternates weekly with allegra.     donepezil (ARICEPT) 5 MG tablet TAKE 1 TABLET(5 MG) BY MOUTH AT BEDTIME 90 tablet 1   esomeprazole (NEXIUM) 20 MG capsule TAKE ONE CAPSULE BY MOUTH DAILY AT 12 NOON 90 capsule 1   fexofenadine (ALLEGRA) 180 MG tablet Take 180 mg by mouth daily. Alternates weekly with zyrtec.     fluticasone (FLONASE) 50 MCG/ACT nasal spray Place 2 sprays into both nostrils daily. 16 g 6   GLUCOSAMINE-CHONDROITIN PO Take 2 tablets by mouth daily.     losartan (COZAAR) 100 MG tablet Take 1 tablet (100 mg total) by mouth daily. 30 tablet 6   Magnesium 250 MG TABS Take 1 tablet (250 mg total) by mouth daily.  0   metoprolol succinate (TOPROL-XL) 25 MG 24 hr tablet TAKE 1 TABLET BY MOUTH DAILY WITH OR IMMEDIATELY FOLLOWING A MEAL 90 tablet 1   Multiple Vitamins-Minerals (MULTIVITAMIN ADULT PO) Take 1 tablet by mouth daily.     polyethylene glycol (MIRALAX / GLYCOLAX) packet Take 8.5 g by mouth daily.      Potassium 99 MG TABS Take 99 mg by mouth daily.     No facility-administered medications prior to visit.    PAST MEDICAL HISTORY: Past Medical History:  Diagnosis Date   Actinic keratosis    Carotid stenosis, asymptomatic, bilateral 09/16/2016   Diffuse 1-39% bilaterally, but involving ICA, ECA, CCA - rpt 1 yr ([id number])   Complication of anesthesia    slow to wake up after a colonoscopy years ago   Diverticulitis    GERD (gastroesophageal reflux disease)    Gout    History of osteomyelitis 10/2015   left foot   History of squamous cell carcinoma of skin    HTN (hypertension)    OSA on CPAP 09/01/2016   Osteoarthritis    back, neck, knees - multiple joints   Personal history of malignant neoplasm of larynx 2009   s/p XRT,  Skin Cancer- "pre cancerous"   Pre-diabetes    Seasonal allergies    Sensorineural hearing loss (SNHL) of both ears 09/01/2016   Squamous cell carcinoma of skin unknown   Treated in [place] [facility] post auricular     PAST SURGICAL HISTORY: Past Surgical History:  Procedure Laterality Date   ACHILLES TENDON REPAIR Left    AMPUTATION Left 05/16/2019  LEFT GREAT TOE AMPUTATION for osteomyelitis Sharol Given, Illene Regulus, MD), 2nd toe removed   APPENDECTOMY     COLONOSCOPY     THROAT SURGERY N/A 2009   ?vocal cord vs larynx nodule removal, had XRT after this but states it was not cancer   TONSILLECTOMY     TREATMENT FISTULA ANAL      FAMILY HISTORY: Family History  Problem Relation Age of Onset   Hypertension Mother    Hypertension Father    Cancer Other        unknown - niece   CAD Sister 38       stent (80% blockage)   CAD Brother 74       stent (100% blockage)    SOCIAL HISTORY: Social History   Socioeconomic History   Marital status: Married    Spouse name: Not on file   Number of children: Not on file   Years of education: Not on file   Highest education level: Bachelor's degree (e.g., BA, AB, BS)  Occupational History   Not on file  Tobacco Use   Smoking status: Never   Smokeless tobacco: Never  Vaping Use   Vaping Use: Never used  Substance and Sexual Activity   Alcohol use: Yes    Comment: occasional wine   Drug use: No   Sexual activity: Not on file  Other Topics Concern   Not on file  Social History Narrative   Widower. 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   Edu: college chemistry degree   Right handed   Caffeine: Drinks 1 cup of coffee every morning    Social Determinants of Radio broadcast assistant Strain: Low Risk    Difficulty of Paying Living Expenses: Not hard at [employer] Insecurity: No Food Insecurity   Worried About Charity fundraiser in the Last Year: Never true   Academic librarian in the Last Year: Never true  Transportation Needs: No Transportation Needs   Lack of Transportation (Medical): No   Lack of Transportation (Non-Medical): No  Physical Activity: Sufficiently Active   Days of Exercise per Week: 7 days   Minutes of Exercise per Session: 30 min  Stress: No Stress Concern Present   Feeling of Stress : Not at all  Social Connections: Not on file  Intimate Partner Violence: Not At Risk   Fear of Current or Ex-Partner: No   Emotionally Abused: No   Physically Abused: No   Sexually Abused: No      PHYSICAL EXAM  Vitals:   12/01/20 1520  BP: (!) 166/62  Pulse: (!) 58  Weight: 224 lb 6.4 oz (101.8 kg)  Height: 5' 9.75" (1.772 m)    Body mass index is 32.43 kg/m.  Generalized: Well developed, in no acute distress  Cardiology: normal rate and rhythm, no murmur noted Respiratory: clear to auscultation bilaterally  Neurological examination  Mentation: Alert oriented to time, place, history taking. Follows all commands speech and language fluent Cranial nerve II-XII: Pupils were equal round reactive to light. Extraocular movements were full, visual field were full  Motor: The motor testing reveals 5 over 5 strength of all 4 extremities. Good symmetric motor tone is noted throughout.  Gait and station: Gait is normal.    DIAGNOSTIC DATA (LABS, IMAGING, TESTING) - I reviewed patient records, labs, notes, testing and imaging myself where available.  MMSE - Mini Mental State Exam 02/11/2020 08/28/2017 08/23/2016  Orientation to time '5 5 5  '$ Orientation to Place '5 5 5  '$ Registration '3 3 3  '$ Attention/ Calculation 5 0 0  Recall '1 2 3  '$ Recall-comments - unable to recall 1 of 3 words -  Language- name 2 objects - 0 0  Language- repeat '1 1 1  '$ Language- follow 3 step command - 3 3  Language- read & follow direction - 0 0  Write a sentence - 0 0  Copy design - 0 0  Total score - 19 20     Lab Results  Component Value Date   WBC 4.6 09/13/2020    HGB 12.5 (L) 09/13/2020   HCT 36.6 (L) 09/13/2020   MCV 95.4 09/13/2020   PLT 178.0 09/13/2020      Component Value Date/Time   NA 138 09/13/2020 1616   K 3.5 09/13/2020 1616   CL 102 09/13/2020 1616   CO2 28 09/13/2020 1616   GLUCOSE 105 (H) 09/13/2020 1616   BUN 18 09/13/2020 1616   CREATININE 1.09 09/13/2020 1616   CALCIUM 8.8 09/13/2020 1616   PROT 6.8 09/13/2020 1616   ALBUMIN 3.9 09/13/2020 1616   AST 29 09/13/2020 1616   ALT 24 09/13/2020 1616   ALKPHOS 55 09/13/2020 1616   BILITOT 0.4 09/13/2020 1616   GFRNONAA >60 05/16/2019 0703   GFRAA >60 05/16/2019 0703   Lab Results  Component Value Date   CHOL 130 02/11/2020   HDL 32.00 (L) 02/11/2020   LDLCALC 67 02/11/2020   LDLDIRECT 76.0 02/05/2019   TRIG 157.0 (H) 02/11/2020   CHOLHDL 4 02/11/2020   Lab Results  Component Value Date   HGBA1C 5.6 02/12/2019   Lab Results  Component Value Date   VITAMINB12 491 03/01/2020   Lab Results  Component Value Date   TSH 2.75 03/01/2020       ASSESSMENT AND PLAN 87 y.o. year old male  has a past medical history of Actinic keratosis, Carotid stenosis, asymptomatic, bilateral ([id number]), Complication of anesthesia, Diverticulitis, GERD (gastroesophageal reflux disease), Gout, History of osteomyelitis (10/2015), History of squamous cell carcinoma of skin, HTN (hypertension), OSA on CPAP (09/01/2016), Osteoarthritis, Personal history of malignant neoplasm of larynx (2009), Pre-diabetes, Seasonal allergies, Sensorineural hearing loss (SNHL) of both ears (09/01/2016), and Squamous cell carcinoma of skin (unknown). here with     ICD-10-CM   1. OSA on CPAP  G47.33 For home use only DME continuous positive airway pressure (CPAP)   Z99.89        Sheena reports doing very well. He is using CPAP nightly. Compliance report reveals excellent compliance.  He was encouraged to continue using CPAP nightly and for greater than 4 hours each night.  He is followed closely by PCP. He was  advised to continue close follow up with PCP and keep an eye on BP.  Adequate hydration, well-balanced diet and regular exercise advised.  We will send updated orders for supplies to [facility].  He will follow-up with me in 1 year, sooner if needed.  He verbalizes understanding and agreement with this plan.   Orders Placed This Encounter  Procedures   For home use only DME continuous positive airway pressure (CPAP)    Supplies    Order Specific Question:   Length of Need    Answer:   Lifetime    Order Specific Question:   Patient has OSA or probable OSA    Answer:   Yes    Order Specific Question:   Is the patient currently  using CPAP in the home    Answer:   Yes    Order Specific Question:   Settings    Answer:   Other see comments    Order Specific Question:   CPAP supplies needed    Answer:   Mask, headgear, cushions, filters, heated tubing and water chamber      No orders of the defined types were placed in this encounter.     Debbora Presto, FNP-C 12/01/2020, 3:58 PM [facility] [address], [place], [place] [address] [phone]  I reviewed the above note and documentation by the Nurse Practitioner and agree with the history, exam, assessment and plan as outlined above. I was available for consultation. Star Age, MD, PhD [facility])

## 2020-12-01 NOTE — Patient Instructions (Signed)

## 2020-12-20 DIAGNOSIS — M9903 Segmental and somatic dysfunction of lumbar region: Secondary | ICD-10-CM | POA: Diagnosis not present

## 2020-12-20 DIAGNOSIS — M5416 Radiculopathy, lumbar region: Secondary | ICD-10-CM | POA: Diagnosis not present

## 2020-12-20 DIAGNOSIS — M955 Acquired deformity of pelvis: Secondary | ICD-10-CM | POA: Diagnosis not present

## 2020-12-20 DIAGNOSIS — M9905 Segmental and somatic dysfunction of pelvic region: Secondary | ICD-10-CM | POA: Diagnosis not present

## 2020-12-20 IMAGING — CR DG FOOT COMPLETE 3+V*L*
1 series · 3 of 3 positions shown · non-contrast
Comparison: 12/02/2018

CLINICAL DATA: Wound LEFT great toe, infection, began as a blister

EXAM:
LEFT FOOT - COMPLETE 3+ VIEW

[Series 1: dg foot complete left · 0.14mm/px · 3 of 3 slices shown]
[im 1/3]
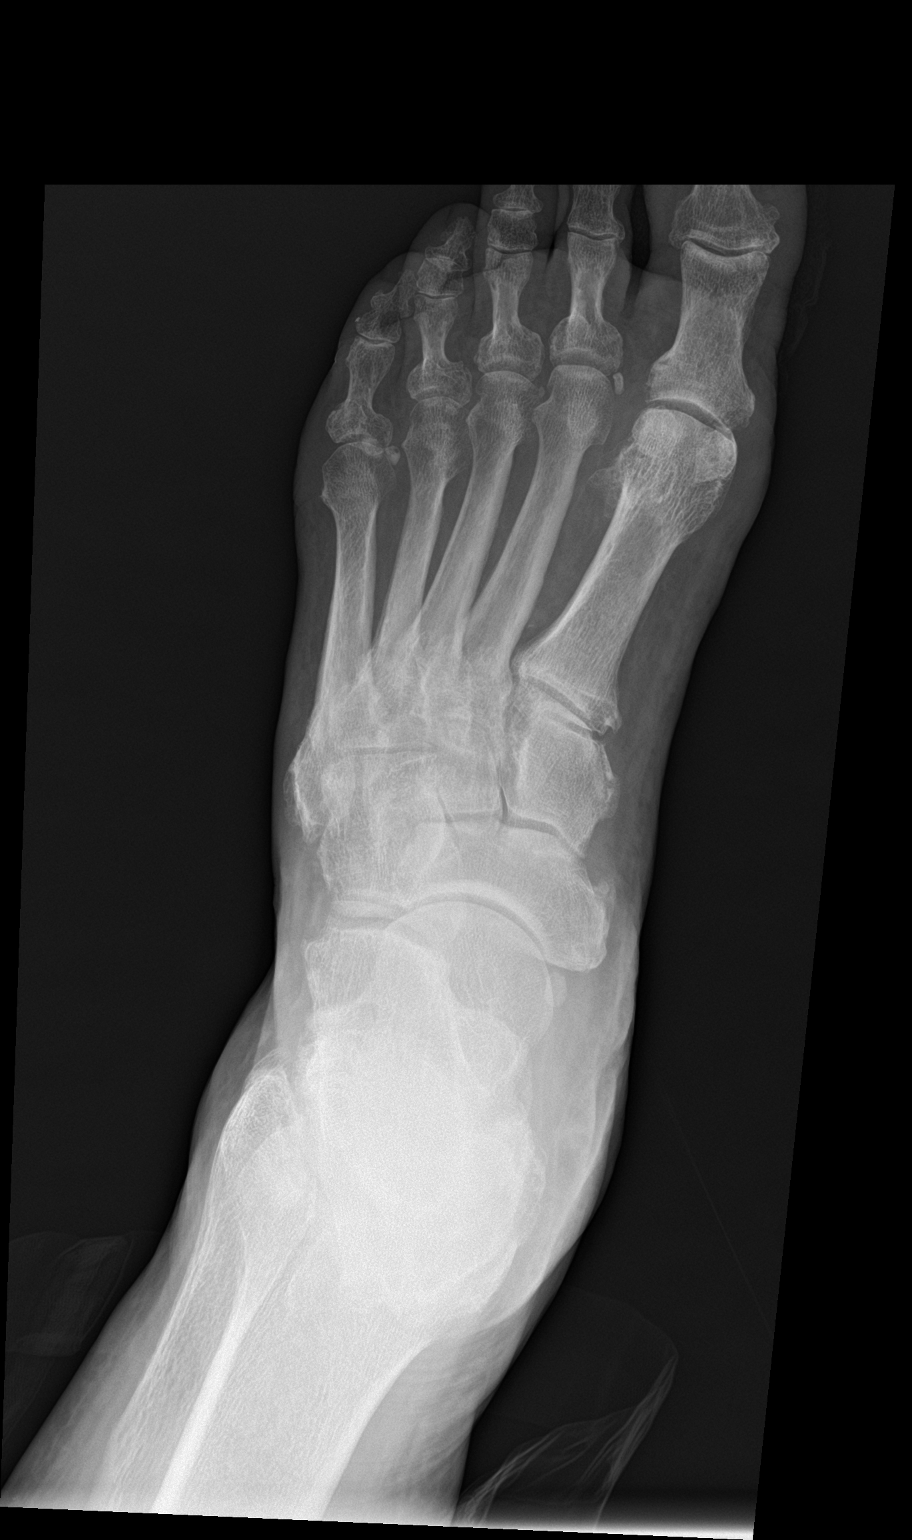
[im 2/3]
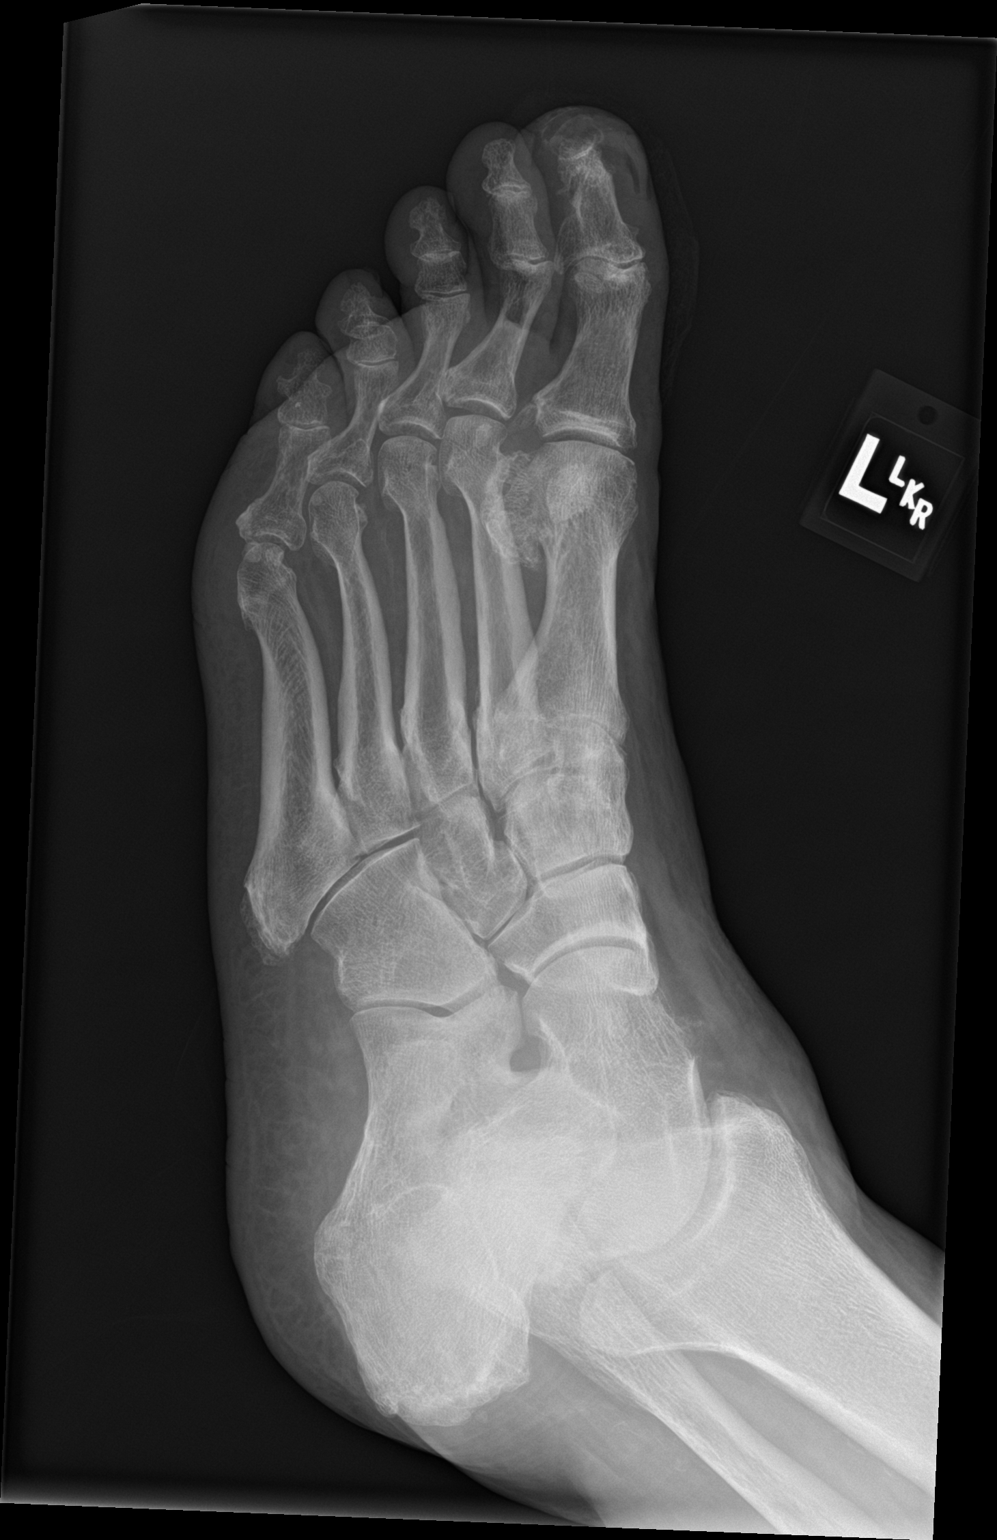
[im 3/3]
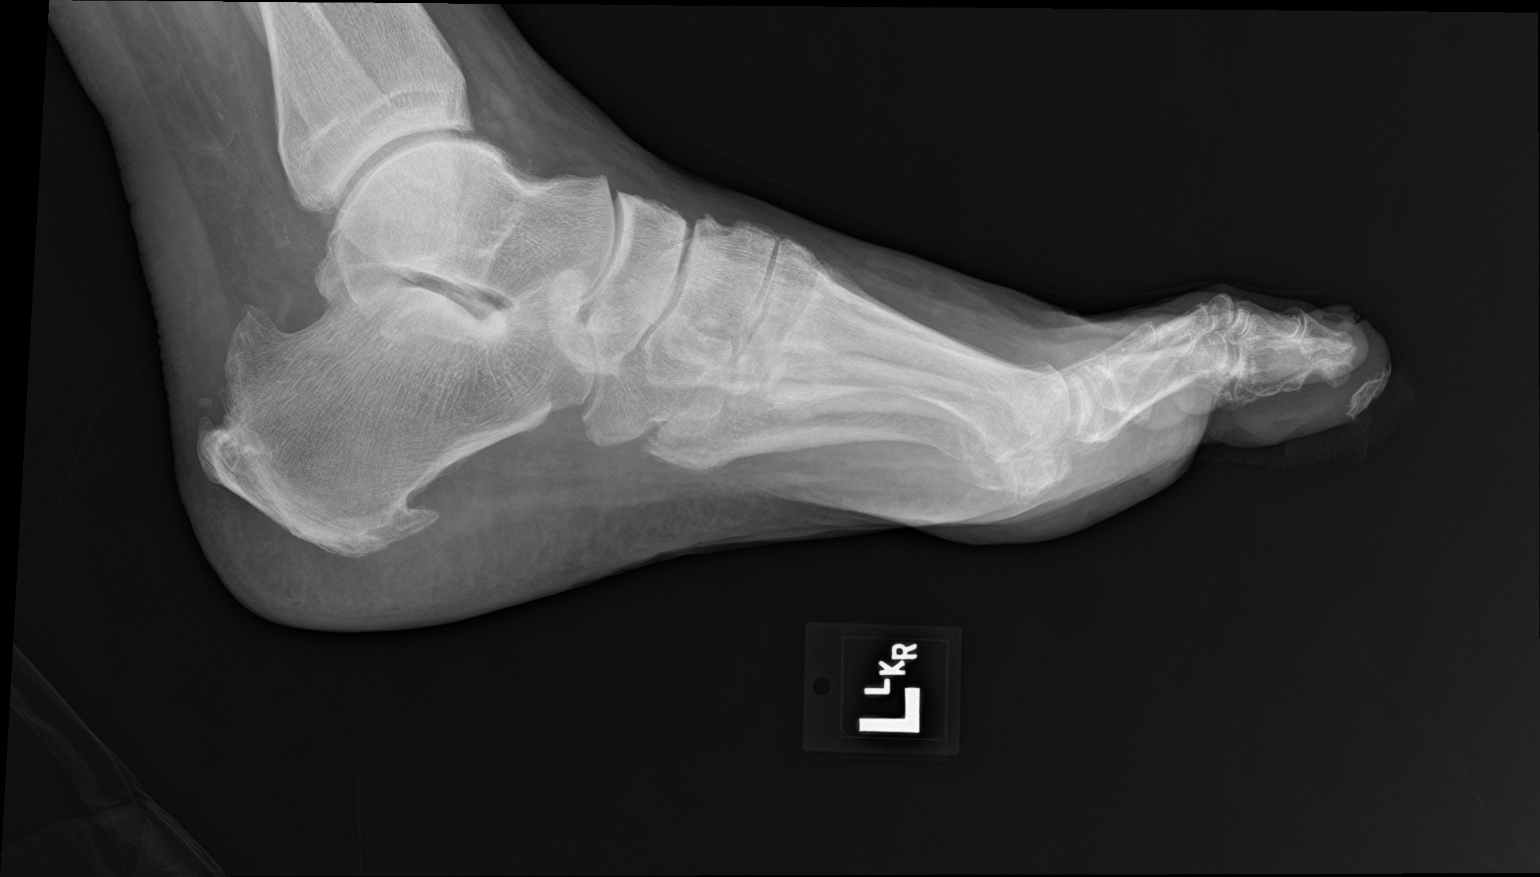

[3 of 3 positions shown; findings below may reference images not displayed]

FINDINGS: Osseous demineralization.

Degenerative changes at IP joint great toe

Joint spaces otherwise preserved.

Subtle cortical loss at tuft of distal phalanx great toe suspicious
for osteomyelitis.

No acute fracture, dislocation, or additional bone destruction.

Dressing artifacts at distal phalanx great toe.
IMPRESSION: Subtle cortical loss at tuft of distal phalanx LEFT great toe
suspicious for osteomyelitis.

## 2020-12-29 ENCOUNTER — Ambulatory Visit: Payer: Medicare Other | Admitting: Dermatology

## 2020-12-31 IMAGING — MR MR TOES*L* WO/W CM
9 series · 40 of 40 positions shown · IV contrast (gadavist)
Comparison: Radiographs dated 04/16/2019

CLINICAL DATA: Chronic progressive soft tissue ulcer of the left
great toe.

EXAM:
MRI OF THE LEFT TOES WITHOUT AND WITH CONTRAST
TECHNIQUE: Multiplanar, multisequence MR imaging of the left forefoot was
performed both before and after administration of intravenous
contrast.
CONTRAST:  10mL GADAVIST GADOBUTROL 1 MMOL/ML IV SOLN

[Series 3: T1 · coronal · left · 3.0mm · 0.38mm/px · 6 of 45 slices shown (1 of 2)]
[im 1/45]
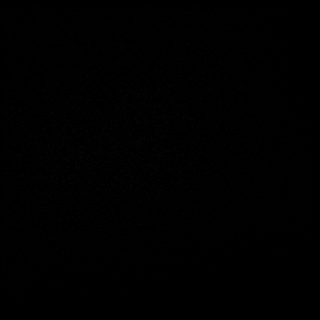
[im 9/45]
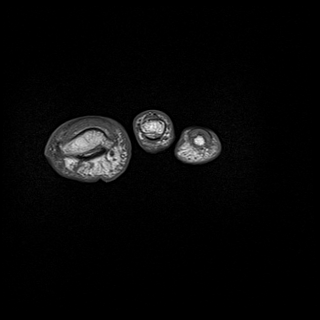
[im 18/45]
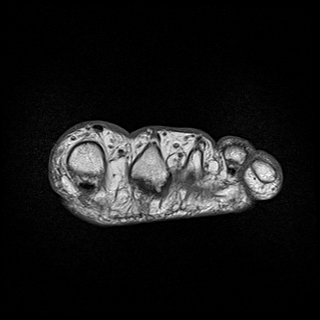
[im 27/45]
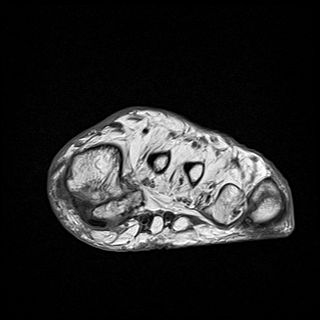
[im 36/45]
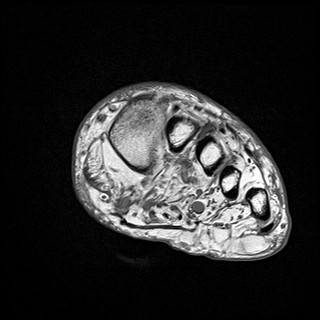
[im 45/45]
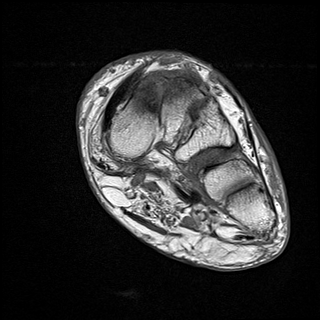

[Series 5: T2 · coronal · left · 3.0mm · 0.38mm/px · 6 of 45 slices shown (1 of 2)]
[im 1/45]
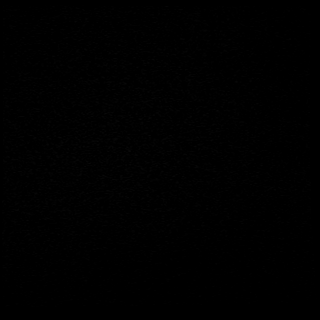
[im 9/45]
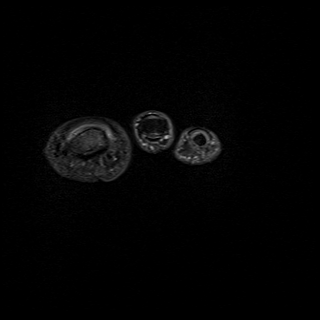
[im 18/45]
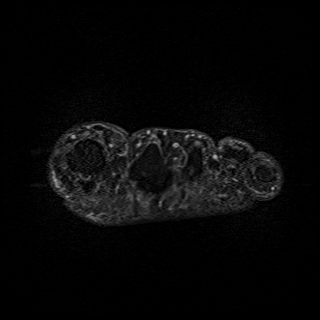
[im 27/45]
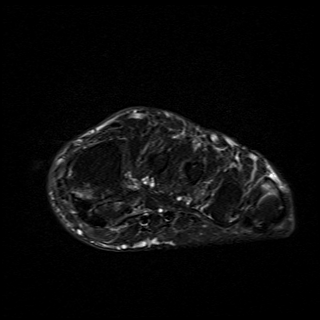
[im 36/45]
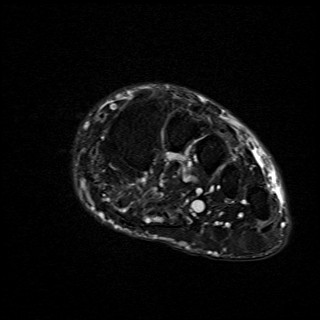
[im 45/45]
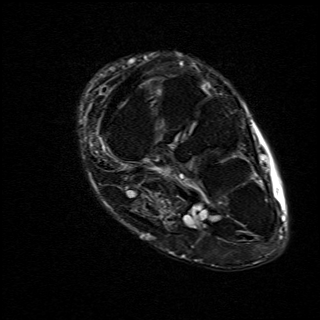

[Series 6: T1 fat-sat · coronal · non-contrast · left · 3.0mm · 0.47mm/px · 6 of 45 slices shown (1 of 3)]
[im 1/45]
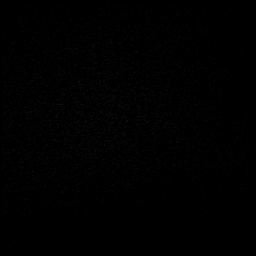
[im 9/45]
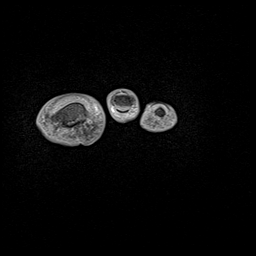
[im 18/45]
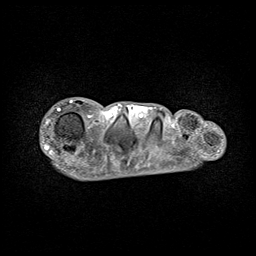
[im 27/45]
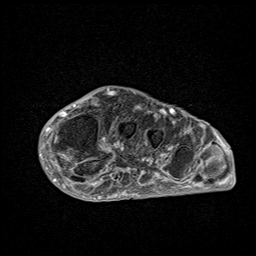
[im 36/45]
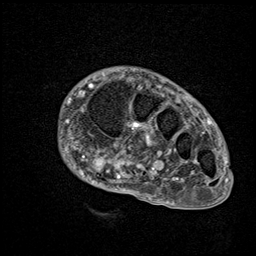
[im 45/45]
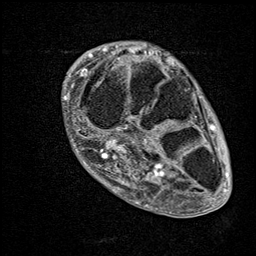

[Series 7: T1 · axial · left · 3.0mm · 0.70mm/px · z∈[-84,+6]mm · 3 of 25 slices shown (2 of 2)]
[im 1/25]
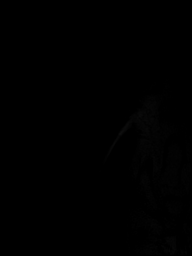
[im 13/25]
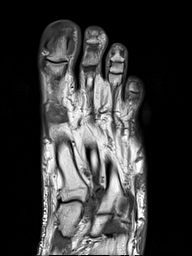
[im 25/25]
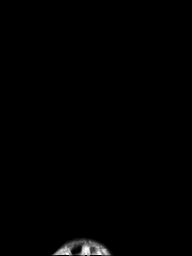

[Series 9: T2 · axial · left · 3.0mm · 0.70mm/px · z∈[-84,+6]mm · 3 of 25 slices shown (2 of 2)]
[im 1/25]
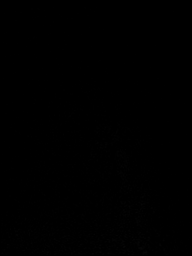
[im 13/25]
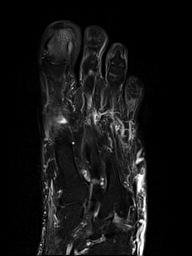
[im 25/25]
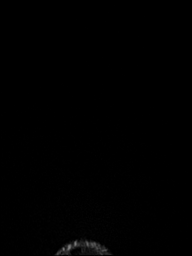

[Series 10: STIR · sagittal · left · 3.0mm · 0.62mm/px · 4 of 31 slices shown]
[im 1/31]
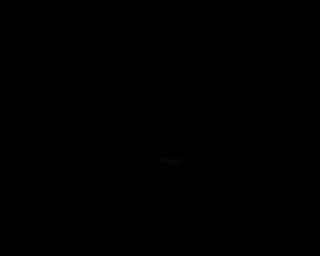
[im 11/31]
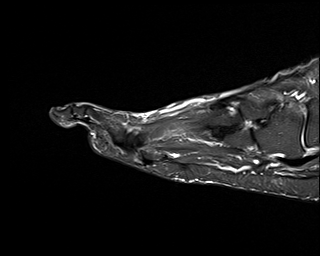
[im 21/31]
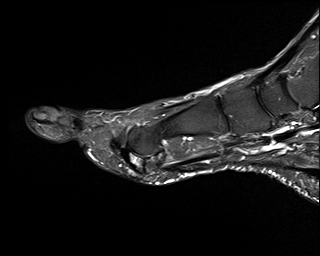
[im 31/31]
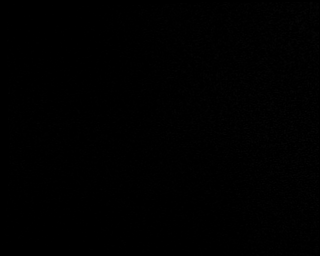

[Series 11: T1 fat-sat post-contrast · coronal · left · 3.0mm · 0.47mm/px · 5 of 43 slices shown]
[im 1/43]
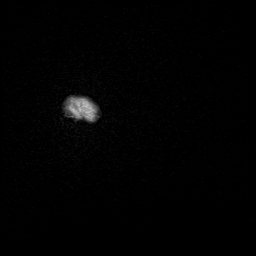
[im 11/43]
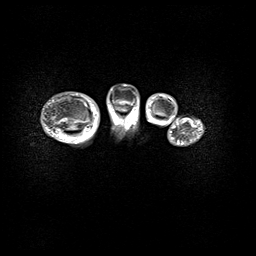
[im 22/43]
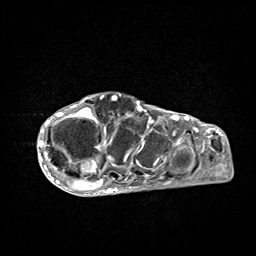
[im 32/43]
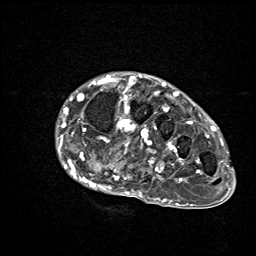
[im 43/43]
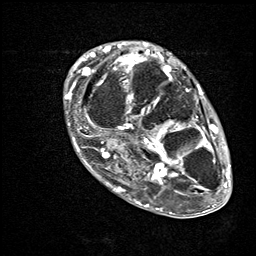

[Series 12: T1 fat-sat · axial · left · 3.0mm · 0.56mm/px · z∈[-84,+6]mm · 3 of 25 slices shown (2 of 3)]
[im 1/25]
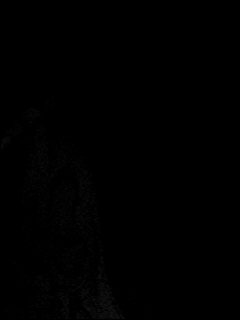
[im 13/25]
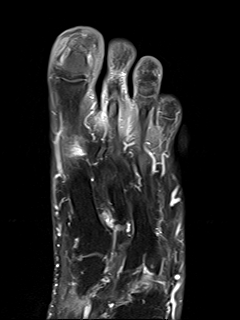
[im 25/25]
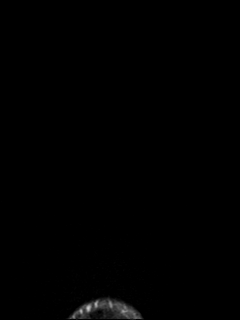

[Series 13: T1 fat-sat · sagittal · left · 3.0mm · 0.62mm/px · 4 of 31 slices shown (3 of 3)]
[im 1/31]
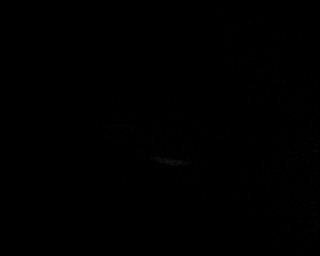
[im 11/31]
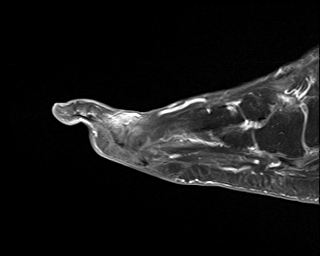
[im 21/31]
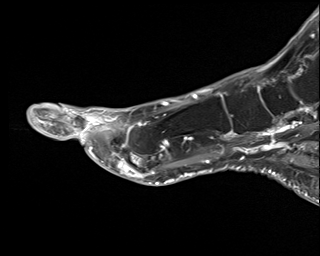
[im 31/31]
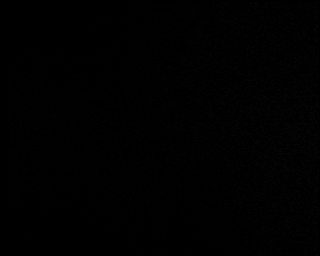

[40 of 40 positions shown; findings below may reference images not displayed]

FINDINGS: Bones/Joint/Cartilage

There is abnormal T1 and T2 signal from the tuft of the distal
phalanx of the great toe. There is abnormal enhancement of the tuft
of the great toe after contrast administration.

Ligaments

Normal.

Muscles and Tendons

Normal.

Soft tissues

Abnormal edema and enhancement of the soft tissues of the distal
aspect of the great toe around the distal phalanx consistent with
cellulitis. No discrete abscess. No joint effusion.
IMPRESSION: 1. Osteomyelitis of the tuft of the distal phalanx of the great toe.
2. Cellulitis of the distal aspect of the great toe.

## 2021-01-04 DIAGNOSIS — M9905 Segmental and somatic dysfunction of pelvic region: Secondary | ICD-10-CM | POA: Diagnosis not present

## 2021-01-04 DIAGNOSIS — M5416 Radiculopathy, lumbar region: Secondary | ICD-10-CM | POA: Diagnosis not present

## 2021-01-04 DIAGNOSIS — M955 Acquired deformity of pelvis: Secondary | ICD-10-CM | POA: Diagnosis not present

## 2021-01-04 DIAGNOSIS — M9903 Segmental and somatic dysfunction of lumbar region: Secondary | ICD-10-CM | POA: Diagnosis not present

## 2021-01-11 ENCOUNTER — Telehealth: Payer: Self-pay | Admitting: Family Medicine

## 2021-01-11 NOTE — Telephone Encounter (Signed)
LVM for pt to rtn my call to r/s appt with [facility]

## 2021-01-12 DIAGNOSIS — M9903 Segmental and somatic dysfunction of lumbar region: Secondary | ICD-10-CM | POA: Diagnosis not present

## 2021-01-12 DIAGNOSIS — M9905 Segmental and somatic dysfunction of pelvic region: Secondary | ICD-10-CM | POA: Diagnosis not present

## 2021-01-12 DIAGNOSIS — M955 Acquired deformity of pelvis: Secondary | ICD-10-CM | POA: Diagnosis not present

## 2021-01-12 DIAGNOSIS — M5416 Radiculopathy, lumbar region: Secondary | ICD-10-CM | POA: Diagnosis not present

## 2021-01-18 IMAGING — DX DG HAND COMPLETE 3+V*R*
3 series · 3 of 3 positions shown · non-contrast
Comparison: No prior.

CLINICAL DATA: Right thumb pain after fall.

EXAM:
RIGHT HAND - COMPLETE 3+ VIEW

[hand ap]
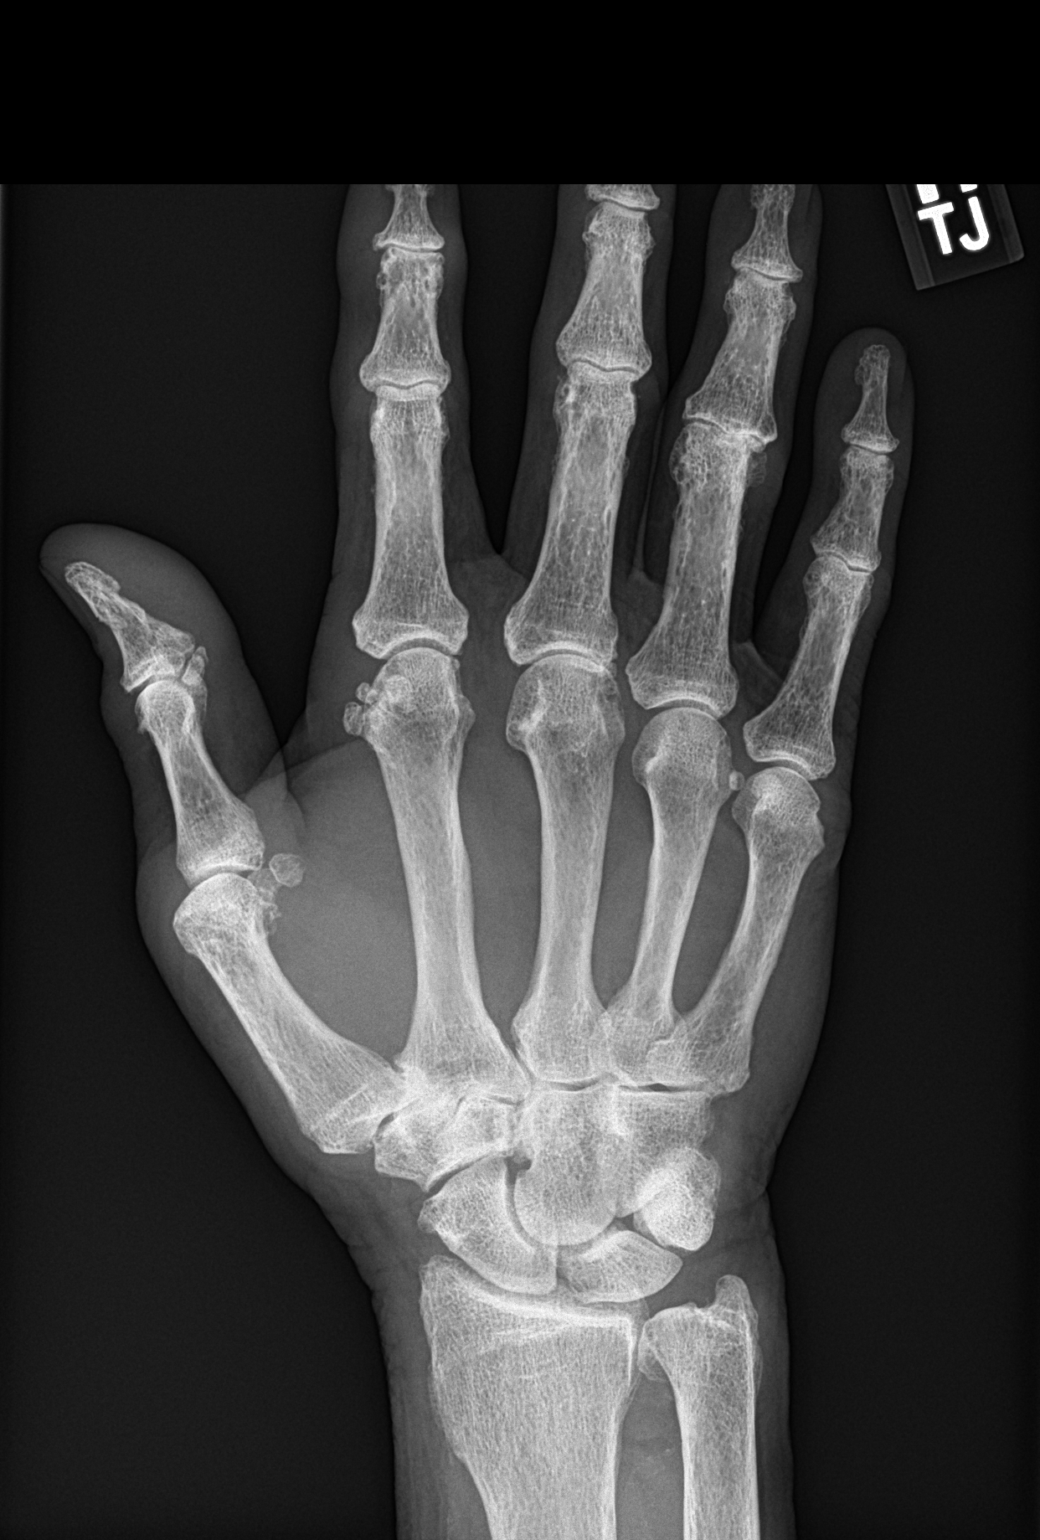

[hand obl]
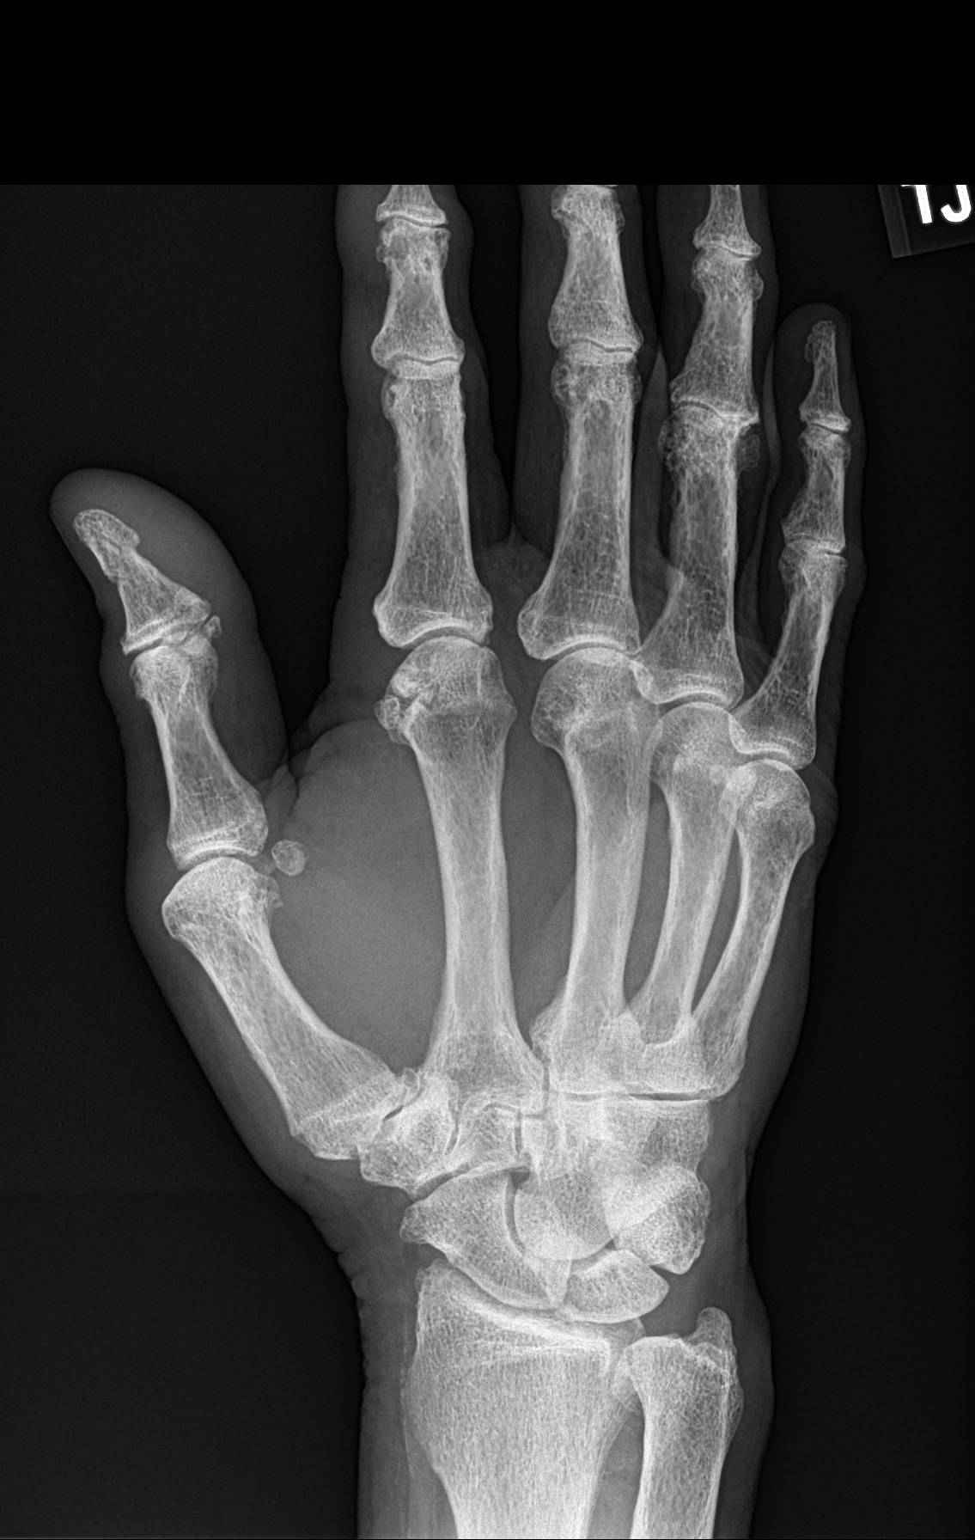

[hand lat]
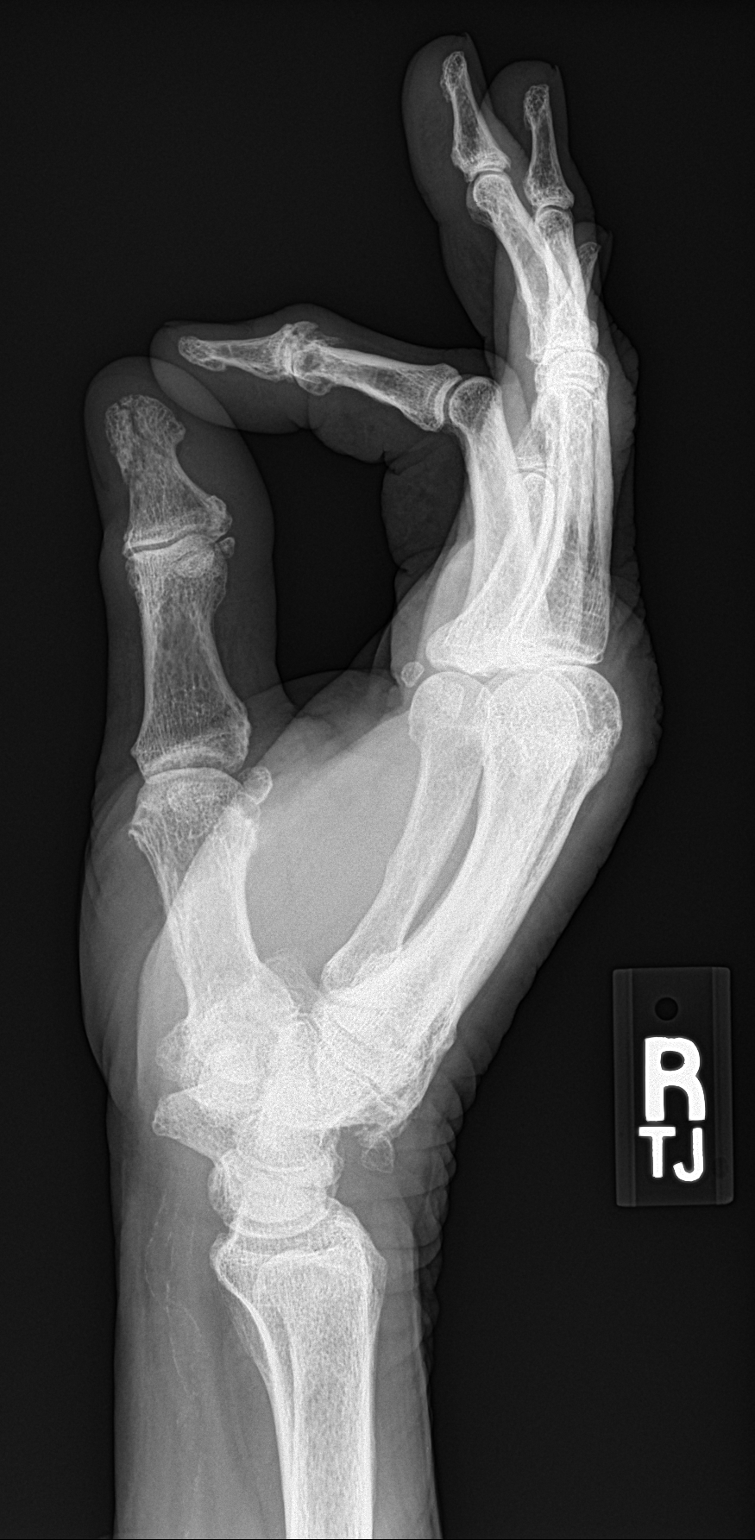

[3 of 3 positions shown; findings below may reference images not displayed]

FINDINGS: Acute comminuted nondisplaced fracture of the distal aspect of the
distal phalanx of the right thumb noted. Diffuse osteopenia and
degenerative change. Corticated bony density noted along the
posterior wrist most likely related to old fracture fragment.
Peripheral vascular calcification.
IMPRESSION: Acute comminuted nondisplaced fracture of the distal aspect of the
distal phalanx of the right thumb.

## 2021-01-20 DIAGNOSIS — H353131 Nonexudative age-related macular degeneration, bilateral, early dry stage: Secondary | ICD-10-CM | POA: Diagnosis not present

## 2021-01-25 DIAGNOSIS — M9903 Segmental and somatic dysfunction of lumbar region: Secondary | ICD-10-CM | POA: Diagnosis not present

## 2021-01-25 DIAGNOSIS — M955 Acquired deformity of pelvis: Secondary | ICD-10-CM | POA: Diagnosis not present

## 2021-01-25 DIAGNOSIS — M5416 Radiculopathy, lumbar region: Secondary | ICD-10-CM | POA: Diagnosis not present

## 2021-01-25 DIAGNOSIS — M9905 Segmental and somatic dysfunction of pelvic region: Secondary | ICD-10-CM | POA: Diagnosis not present

## 2021-02-03 DIAGNOSIS — S91209A Unspecified open wound of unspecified toe(s) with damage to nail, initial encounter: Secondary | ICD-10-CM | POA: Diagnosis not present

## 2021-02-04 ENCOUNTER — Other Ambulatory Visit: Payer: Self-pay | Admitting: Family Medicine

## 2021-02-09 ENCOUNTER — Ambulatory Visit: Payer: Medicare Other | Admitting: Dermatology

## 2021-02-10 ENCOUNTER — Other Ambulatory Visit: Payer: Medicare Other

## 2021-02-10 ENCOUNTER — Ambulatory Visit: Payer: Medicare Other

## 2021-02-15 DIAGNOSIS — M9905 Segmental and somatic dysfunction of pelvic region: Secondary | ICD-10-CM | POA: Diagnosis not present

## 2021-02-15 DIAGNOSIS — M955 Acquired deformity of pelvis: Secondary | ICD-10-CM | POA: Diagnosis not present

## 2021-02-15 DIAGNOSIS — M5416 Radiculopathy, lumbar region: Secondary | ICD-10-CM | POA: Diagnosis not present

## 2021-02-15 DIAGNOSIS — M9903 Segmental and somatic dysfunction of lumbar region: Secondary | ICD-10-CM | POA: Diagnosis not present

## 2021-02-16 ENCOUNTER — Ambulatory Visit (INDEPENDENT_AMBULATORY_CARE_PROVIDER_SITE_OTHER): Payer: Medicare Other | Admitting: Family Medicine

## 2021-02-16 ENCOUNTER — Other Ambulatory Visit: Payer: Self-pay

## 2021-02-16 ENCOUNTER — Encounter: Payer: Self-pay | Admitting: Family Medicine

## 2021-02-16 VITALS — BP 140/76 | HR 55 | Temp 97.5°F | Ht 68.0 in | Wt 218.4 lb

## 2021-02-16 DIAGNOSIS — I10 Essential (primary) hypertension: Secondary | ICD-10-CM

## 2021-02-16 DIAGNOSIS — R011 Cardiac murmur, unspecified: Secondary | ICD-10-CM | POA: Insufficient documentation

## 2021-02-16 DIAGNOSIS — S98131A Complete traumatic amputation of one right lesser toe, initial encounter: Secondary | ICD-10-CM

## 2021-02-16 DIAGNOSIS — Z0000 Encounter for general adult medical examination without abnormal findings: Secondary | ICD-10-CM | POA: Insufficient documentation

## 2021-02-16 DIAGNOSIS — Z7189 Other specified counseling: Secondary | ICD-10-CM

## 2021-02-16 DIAGNOSIS — E785 Hyperlipidemia, unspecified: Secondary | ICD-10-CM

## 2021-02-16 DIAGNOSIS — M1A00X0 Idiopathic chronic gout, unspecified site, without tophus (tophi): Secondary | ICD-10-CM | POA: Diagnosis not present

## 2021-02-16 DIAGNOSIS — H903 Sensorineural hearing loss, bilateral: Secondary | ICD-10-CM

## 2021-02-16 DIAGNOSIS — I6523 Occlusion and stenosis of bilateral carotid arteries: Secondary | ICD-10-CM

## 2021-02-16 DIAGNOSIS — E669 Obesity, unspecified: Secondary | ICD-10-CM

## 2021-02-16 DIAGNOSIS — K219 Gastro-esophageal reflux disease without esophagitis: Secondary | ICD-10-CM

## 2021-02-16 DIAGNOSIS — Z9989 Dependence on other enabling machines and devices: Secondary | ICD-10-CM

## 2021-02-16 DIAGNOSIS — G3184 Mild cognitive impairment, so stated: Secondary | ICD-10-CM

## 2021-02-16 MED ORDER — SHINGRIX 50 MCG/0.5ML IM SUSR: 0.5000 mL | Freq: Once | INTRAMUSCULAR | 1 refills | Status: AC

## 2021-02-16 NOTE — Assessment & Plan Note (Signed)
Wears hearing aides 

## 2021-02-16 NOTE — Assessment & Plan Note (Signed)
Advanced directive discussion - needs updating. Would want wife to be HCPOA. Packet again provided today.

## 2021-02-16 NOTE — Assessment & Plan Note (Addendum)
Continued benefit with low dose donepezil, as well as benefiting from new job at [employer] since 08/2020. Overall doing well - will continue donepezil 5mg  - consider increasing dose to 10mg  at next visit.

## 2021-02-16 NOTE — Progress Notes (Signed)
Patient ID: [name], male    DOB: 02-Apr-1932, 87 y.o.   MRN: [id number]  This visit was conducted in person.  BP 140/76   Pulse (!) 55   Temp (!) 97.5 F (36.4 C) (Temporal)   Ht 5\' 8"  (1.727 m)   Wt 218 lb 7 oz (99.1 kg)   SpO2 96%   BMI 33.21 kg/m    CC: AMW/CPE Subjective:   HPI: [name] is a 87 y.o. male presenting on 02/16/2021 for Medicare Wellness   Did not see health advisor.   Hearing Screening - Comments:: Wears bilateral hearing aids.  Wearing at today's OV.  Vision Screening - Comments:: Last eye exam, 01/2021.  [place] Visit from 02/16/2021 in [facility] at [place]  PHQ-2 Total Score 0       Fall Risk  02/16/2021 05/19/2020 02/11/2020 02/12/2019 12/05/2018  Falls in the past year? 0 0 0 0 0  Comment - - - - Emmi Telephone Survey: data to providers prior to load  Number falls in past yr: - 0 0 - -  Injury with Fall? - 0 0 - -  Risk for fall due to : - - Medication side effect - -  Follow up - Falls evaluation completed Falls prevention discussed;Falls evaluation completed - -    "[name]" Continues working as Systems analyst. 6 lb weight loss with increased activity at work.  OSA on CPAP followed by [facility].   Preventative: Colon cancer screening - normal colonoscopies, last ~2009 - aged out Prostate cancer screening - age out  Lung cancer screening - not eligible  Flu shot - yearly [place] 06/2019, 07/2019, booster 02/2020, bivalent booster 01/2021 Tdap 2014 Pneumovax 2012, Prevnar-13 2016 zostavax 2009 shingrix - discussed Advanced directive discussion - needs updating. Would want wife to be HCPOA. Packet again provided today.  Seat belt use discussed.  Sunscreen use discussed. No changing moles on skin. Sees derm tomorrow.  Non smoker  Alcohol - was heavy drinker, has cut down significantly - 1 beer/month  Dentist q6 mo  Eye exam yearly ([place])  Bowel - constipation managed with  miralax  Bladder - no incontinence   Widower. Second marriage to [place]: retired [employer]: college chemistry degree - going to real estate school Right handed Caffeine: Drinks 1 cup of coffee every morning      Relevant past medical, surgical, family and social history reviewed and updated as indicated. Interim medical history since our last visit reviewed. Allergies and medications reviewed and updated. Outpatient Medications Prior to Visit  Medication Sig Dispense Refill   acetaminophen (TYLENOL) 500 MG tablet Take 1-2 tablets (500-1,000 mg total) by mouth 2 (two) times daily as needed for moderate pain. (Patient taking differently: Take 500-1,000 mg by mouth 2 (two) times daily as needed for moderate pain. Pt states taking 3 tablets)     allopurinol (ZYLOPRIM) 100 MG tablet TAKE 2 TABLETS(200 MG) BY MOUTH DAILY 180 tablet 3   aspirin EC 81 MG tablet Take 81 mg by mouth daily.     azelastine (ASTELIN) 0.1 % nasal spray Place 1-2 sprays into both nostrils 2 (two) times daily. Use in each nostril as directed 30 mL 6   cetirizine (ZYRTEC) 10 MG tablet Take 10 mg by mouth daily. Alternates weekly with allegra.     donepezil (ARICEPT) 5 MG tablet TAKE 1 TABLET(5 MG) BY MOUTH AT BEDTIME  90 tablet 1   esomeprazole (NEXIUM) 20 MG capsule TAKE ONE CAPSULE BY MOUTH DAILY AT 12:00 PM 90 capsule 0   fexofenadine (ALLEGRA) 180 MG tablet Take 180 mg by mouth daily. Alternates weekly with zyrtec.     fluticasone (FLONASE) 50 MCG/ACT nasal spray Place 2 sprays into both nostrils daily. 16 g 6   GLUCOSAMINE-CHONDROITIN PO Take 2 tablets by mouth daily.     losartan (COZAAR) 100 MG tablet Take 1 tablet (100 mg total) by mouth daily. 30 tablet 6   Magnesium 250 MG TABS Take 1 tablet (250 mg total) by mouth daily. (Patient taking differently: Take 2 tablets by mouth daily.)  0   metoprolol succinate (TOPROL-XL) 25 MG 24 hr tablet TAKE 1 TABLET BY  MOUTH DAILY WITH OR IMMEDIATELY FOLLOWING A MEAL 90 tablet 0   Misc Natural Products (OSTEO BI-FLEX TRIPLE STRENGTH PO) Take by mouth.     Multiple Vitamins-Minerals (MULTIVITAMIN ADULT PO) Take 1 tablet by mouth daily.     polyethylene glycol (MIRALAX / GLYCOLAX) packet Take 8.5 g by mouth daily.      Potassium 99 MG TABS Take 99 mg by mouth daily.     carboxymethylcellulose (REFRESH TEARS) 0.5 % SOLN Place 1 drop into both eyes daily as needed (dry eyes). (Patient not taking: Reported on 02/16/2021)     No facility-administered medications prior to visit.     Per HPI unless specifically indicated in ROS section below Review of Systems  Constitutional:  Negative for activity change, appetite change, chills, fatigue, fever and unexpected weight change.  HENT:  Negative for hearing loss.   Eyes:  Negative for visual disturbance.  Respiratory:  Negative for cough, chest tightness, shortness of breath and wheezing.   Cardiovascular:  Negative for chest pain, palpitations and leg swelling.  Gastrointestinal:  Positive for constipation (managed with miralax). Negative for abdominal distention, abdominal pain, blood in stool, diarrhea, nausea and vomiting.  Genitourinary:  Negative for difficulty urinating and hematuria.  Musculoskeletal:  Negative for arthralgias, myalgias and neck pain.  Skin:  Negative for rash.  Neurological:  Negative for dizziness, seizures, syncope and headaches.  Hematological:  Negative for adenopathy. Does not bruise/bleed easily.  Psychiatric/Behavioral:  Negative for dysphoric mood. The patient is not nervous/anxious.    Objective:  BP 140/76   Pulse (!) 55   Temp (!) 97.5 F (36.4 C) (Temporal)   Ht 5\' 8"  (1.727 m)   Wt 218 lb 7 oz (99.1 kg)   SpO2 96%   BMI 33.21 kg/m   Wt Readings from Last 3 Encounters:  02/16/21 218 lb 7 oz (99.1 kg)  12/01/20 224 lb 6.4 oz (101.8 kg)  10/12/20 222 lb 3 oz (100.8 kg)      Physical Exam Vitals and nursing note  reviewed.  Constitutional:      General: He is not in acute distress.    Appearance: Normal appearance. He is well-developed. He is not ill-appearing.  HENT:     Head: Normocephalic and atraumatic.     Right Ear: Hearing and external ear normal.     Left Ear: Hearing and external ear normal.     Ears:     Comments: Hearing aides in place Eyes:     General: No scleral icterus.    Extraocular Movements: Extraocular movements intact.     Conjunctiva/sclera: Conjunctivae normal.     Pupils: Pupils are equal, round, and reactive to light.  Neck:     Thyroid: No thyroid mass  or thyromegaly.     Vascular: Carotid bruit (bilateral ?referred) present.  Cardiovascular:     Rate and Rhythm: Normal rate and regular rhythm.     Pulses: Normal pulses.          Radial pulses are 2+ on the right side and 2+ on the left side.     Heart sounds: Murmur (2/6 systolic) heard.  Pulmonary:     Effort: Pulmonary effort is normal. No respiratory distress.     Breath sounds: Normal breath sounds. No wheezing, rhonchi or rales.  Abdominal:     General: Bowel sounds are normal. There is no distension.     Palpations: Abdomen is soft. There is no mass.     Tenderness: There is no abdominal tenderness. There is no guarding or rebound.     Hernia: No hernia is present.  Musculoskeletal:        General: Normal range of motion.     Cervical back: Normal range of motion and neck supple.     Right lower leg: No edema.     Left lower leg: No edema.  Lymphadenopathy:     Cervical: No cervical adenopathy.  Skin:    General: Skin is warm and dry.     Findings: No rash.  Neurological:     General: No focal deficit present.     Mental Status: He is alert and oriented to person, place, and time.     Comments:  Recall 1/3, 2/3 with recall  Calculation 5/5 DLROW  Psychiatric:        Mood and Affect: Mood normal.        Behavior: Behavior normal.        Thought Content: Thought content normal.        Judgment:  Judgment normal.      Results for orders placed or performed in visit on 09/13/20  CK  Result Value Ref Range   Total CK 156 7 - 232 U/L  Magnesium  Result Value Ref Range   Magnesium 2.0 1.5 - 2.5 mg/dL  CBC with Differential/Platelet  Result Value Ref Range   WBC 4.6 4.0 - 10.5 K/uL   RBC 3.84 (L) 4.22 - 5.81 Mil/uL   Hemoglobin 12.5 (L) 13.0 - 17.0 g/dL   HCT 36.6 (L) 39.0 - 52.0 %   MCV 95.4 78.0 - 100.0 fl   MCHC 34.2 30.0 - 36.0 g/dL   RDW 14.4 11.5 - 15.5 %   Platelets 178.0 150.0 - 400.0 K/uL   Neutrophils Relative % 53.7 43.0 - 77.0 %   Lymphocytes Relative 32.6 12.0 - 46.0 %   Monocytes Relative 9.6 3.0 - 12.0 %   Eosinophils Relative 3.3 0.0 - 5.0 %   Basophils Relative 0.8 0.0 - 3.0 %   Neutro Abs 2.5 1.4 - 7.7 K/uL   Lymphs Abs 1.5 0.7 - 4.0 K/uL   Monocytes Absolute 0.4 0.1 - 1.0 K/uL   Eosinophils Absolute 0.2 0.0 - 0.7 K/uL   Basophils Absolute 0.0 0.0 - 0.1 K/uL  Comprehensive metabolic panel  Result Value Ref Range   Sodium 138 135 - 145 mEq/L   Potassium 3.5 3.5 - 5.1 mEq/L   Chloride 102 96 - 112 mEq/L   CO2 28 19 - 32 mEq/L   Glucose, Bld 105 (H) 70 - 99 mg/dL   BUN 18 6 - 23 mg/dL   Creatinine, Ser 1.09 0.40 - 1.50 mg/dL   Total Bilirubin 0.4 0.2 - 1.2 mg/dL  Alkaline Phosphatase 55 39 - 117 U/L   AST 29 0 - 37 U/L   ALT 24 0 - 53 U/L   Total Protein 6.8 6.0 - 8.3 g/dL   Albumin 3.9 3.5 - 5.2 g/dL   GFR 61.36 >60.00 mL/min   Calcium 8.8 8.4 - 10.5 mg/dL    Assessment & Plan:  This visit occurred during the SARS-CoV-2 public health emergency.  Safety protocols were in place, including screening questions prior to the visit, additional usage of staff PPE, and extensive cleaning of exam room while observing appropriate contact time as indicated for disinfecting solutions.   Problem List Items Addressed This Visit     Advanced care planning/counseling discussion (Chronic)    Advanced directive discussion - needs updating. Would want wife to be  HCPOA. Packet again provided today.       Medicare annual wellness visit, subsequent - Primary (Chronic)    I have personally reviewed the Medicare Annual Wellness questionnaire and have noted 1. The patient's medical and social history 2. Their use of alcohol, tobacco or illicit drugs 3. Their current medications and supplements 4. The patient's functional ability including ADL's, fall risks, home safety risks and hearing or visual impairment. Cognitive function has been assessed and addressed as indicated.  5. Diet and physical activity 6. Evidence for depression or mood disorders The patients weight, height, BMI have been recorded in the chart. I have made referrals, counseling and provided education to the patient based on review of the above and I have provided the pt with a written personalized care plan for preventive services. Provider list updated.. See scanned questionairre as needed for further documentation. Reviewed preventative protocols and updated unless pt declined.       Health maintenance examination (Chronic)    Preventative protocols reviewed and updated unless pt declined. Discussed healthy diet and lifestyle.       GERD (gastroesophageal reflux disease)    Continues esomeprazole 20mg  daily.       Gout    Chronic, stable period on allopurinol 200mg  - continue. Update urate.       Relevant Orders   Uric acid   HTN (hypertension)    Chronic, stable. Continue current regimen of losartan 100mg  daily and toprol XL 25mg  daily      OSA on CPAP    Continues CPAP use regularly, followed by neurology       Sensorineural hearing loss (SNHL) of both ears    Wears hearing aides.       Obesity, Class I, BMI 30.0-34.9 (see actual BMI)    Congratulated on weight loss to date.  Encouraged ongoing healthy diet and lifestyle choices to affect sustainable weight loss.       Carotid stenosis, asymptomatic, bilateral    Mild, asxs. Monitoring with clinical exam.        Dyslipidemia    Update FLP. Currently off statin, not started given age.  The ASCVD Risk score (Arnett DK, et al., 2019) failed to calculate for the following reasons:   The 2019 ASCVD risk score is only valid for ages 72 to 39       Relevant Orders   Lipid panel   Comprehensive metabolic panel   Amputation of one or more toes ([place])    S/p 1st/2nd R toe amputation after osteomyelitis infection       MCI (mild cognitive impairment)    Continued benefit with low dose donepezil, as well as benefiting from new job at [employer] since 08/2020.  Overall doing well - will continue donepezil 5mg  - consider increasing dose to 10mg  at next visit.       Systolic murmur    Again noted today, mild. Pt asxs. Will continue to monitor.         Meds ordered this encounter  Medications   Zoster Vaccine Adjuvanted [facility]) injection    Sig: Inject 0.5 mLs into the muscle once for 1 dose. Repeat in 2-6 months    Dispense:  0.5 mL    Refill:  1    Orders Placed This Encounter  Procedures   Lipid panel   Comprehensive metabolic panel   Uric acid     Patient instructions: Labs today.  If interested, check with pharmacy about new 2 shot shingles series (Shingrix).  Work on Surveyor, quantity living will. Packet provided today.  You are doing well today  Return as needed or in 6 months for follow up visit.   Follow up plan: Return in about 6 months (around 08/17/2021) for follow up visit.  Ria Bush, MD

## 2021-02-16 NOTE — Assessment & Plan Note (Signed)
Preventative protocols reviewed and updated unless pt declined. Discussed healthy diet and lifestyle.  

## 2021-02-16 NOTE — Assessment & Plan Note (Signed)
Again noted today, mild. Pt asxs. Will continue to monitor.

## 2021-02-16 NOTE — Assessment & Plan Note (Addendum)
Update FLP. Currently off statin, not started given age.  The ASCVD Risk score (Arnett DK, et al., 2019) failed to calculate for the following reasons:   The 2019 ASCVD risk score is only valid for ages 20 to 90

## 2021-02-16 NOTE — Assessment & Plan Note (Signed)
S/p 1st/2nd R toe amputation after osteomyelitis infection

## 2021-02-16 NOTE — Assessment & Plan Note (Signed)
Chronic, stable period on allopurinol 200mg  - continue. Update urate.

## 2021-02-16 NOTE — Assessment & Plan Note (Signed)
Congratulated on weight loss to date.  Encouraged ongoing healthy diet and lifestyle choices to affect sustainable weight loss.

## 2021-02-16 NOTE — Assessment & Plan Note (Signed)

## 2021-02-16 NOTE — Patient Instructions (Addendum)
Labs today.  If interested, check with pharmacy about new 2 shot shingles series (Shingrix).  Work on Surveyor, quantity living will. Packet provided today.  You are doing well today  Return as needed or in 6 months for follow up visit.   Health Maintenance After Age 85 After age 95, you are at a higher risk for certain long-term diseases and infections as well as injuries from falls. Falls are a major cause of broken bones and head injuries in people who are older than age 71. Getting regular preventive care can help to keep you healthy and well. Preventive care includes getting regular testing and making lifestyle changes as recommended by your health care provider. Talk with your health care provider about: Which screenings and tests you should have. A screening is a test that checks for a disease when you have no symptoms. A diet and exercise plan that is right for you. What should I know about screenings and tests to prevent falls? Screening and testing are the best ways to find a health problem early. Early diagnosis and treatment give you the best chance of managing medical conditions that are common after age 1. Certain conditions and lifestyle choices may make you more likely to have a fall. Your health care provider may recommend: Regular vision checks. Poor vision and conditions such as cataracts can make you more likely to have a fall. If you wear glasses, make sure to get your prescription updated if your vision changes. Medicine review. Work with your health care provider to regularly review all of the medicines you are taking, including over-the-counter medicines. Ask your health care provider about any side effects that may make you more likely to have a fall. Tell your health care provider if any medicines that you take make you feel dizzy or sleepy. Osteoporosis screening. Osteoporosis is a condition that causes the bones to get weaker. This can make the bones weak and cause them to break  more easily. Blood pressure screening. Blood pressure changes and medicines to control blood pressure can make you feel dizzy. Strength and balance checks. Your health care provider may recommend certain tests to check your strength and balance while standing, walking, or changing positions. Foot health exam. Foot pain and numbness, as well as not wearing proper footwear, can make you more likely to have a fall. Depression screening. You may be more likely to have a fall if you have a fear of falling, feel emotionally low, or feel unable to do activities that you used to do. Alcohol use screening. Using too much alcohol can affect your balance and may make you more likely to have a fall. What actions can I take to lower my risk of falls? General instructions Talk with your health care provider about your risks for falling. Tell your health care provider if: You fall. Be sure to tell your health care provider about all falls, even ones that seem minor. You feel dizzy, sleepy, or off-balance. Take over-the-counter and prescription medicines only as told by your health care provider. These include any supplements. Eat a healthy diet and maintain a healthy weight. A healthy diet includes low-fat dairy products, low-fat (lean) meats, and fiber from whole grains, beans, and lots of fruits and vegetables. Home safety Remove any tripping hazards, such as rugs, cords, and clutter. Install safety equipment such as grab bars in bathrooms and safety rails on stairs. Keep rooms and walkways well-lit. Activity  Follow a regular exercise program to stay fit. This  will help you maintain your balance. Ask your health care provider what types of exercise are appropriate for you. If you need a cane or walker, use it as recommended by your health care provider. Wear supportive shoes that have nonskid soles. Lifestyle Do not drink alcohol if your health care provider tells you not to drink. If you drink alcohol,  limit how much you have: 0-1 drink a day for women. 0-2 drinks a day for men. Be aware of how much alcohol is in your drink. In the [place] one drink equals one typical bottle of beer (12 oz), one-half glass of wine (5 oz), or one shot of hard liquor (1 oz). Do not use any products that contain nicotine or tobacco, such as cigarettes and e-cigarettes. If you need help quitting, ask your health care provider. Summary Having a healthy lifestyle and getting preventive care can help to protect your health and wellness after age 48. Screening and testing are the best way to find a health problem early and help you avoid having a fall. Early diagnosis and treatment give you the best chance for managing medical conditions that are more common for people who are older than age 68. Falls are a major cause of broken bones and head injuries in people who are older than age 30. Take precautions to prevent a fall at home. Work with your health care provider to learn what changes you can make to improve your health and wellness and to prevent falls. This information is not intended to replace advice given to you by your health care provider. Make sure you discuss any questions you have with your health care provider. Document Revised: 07/02/2020 Document Reviewed: 04/09/2020 Elsevier Patient Education  2022 [employer].

## 2021-02-16 NOTE — Assessment & Plan Note (Signed)
Continues esomeprazole 20mg  daily.

## 2021-02-16 NOTE — Assessment & Plan Note (Signed)
Mild, asxs. Monitoring with clinical exam.

## 2021-02-16 NOTE — Assessment & Plan Note (Signed)
Continues CPAP use regularly, followed by neurology

## 2021-02-16 NOTE — Assessment & Plan Note (Signed)
Chronic, stable. Continue current regimen of losartan 100mg  daily and toprol XL 25mg  daily

## 2021-02-17 ENCOUNTER — Ambulatory Visit: Payer: Medicare Other | Admitting: Dermatology

## 2021-02-17 DIAGNOSIS — Z1283 Encounter for screening for malignant neoplasm of skin: Secondary | ICD-10-CM | POA: Diagnosis not present

## 2021-02-17 DIAGNOSIS — Z872 Personal history of diseases of the skin and subcutaneous tissue: Secondary | ICD-10-CM

## 2021-02-17 DIAGNOSIS — L821 Other seborrheic keratosis: Secondary | ICD-10-CM

## 2021-02-17 DIAGNOSIS — L570 Actinic keratosis: Secondary | ICD-10-CM | POA: Diagnosis not present

## 2021-02-17 DIAGNOSIS — L820 Inflamed seborrheic keratosis: Secondary | ICD-10-CM

## 2021-02-17 DIAGNOSIS — L578 Other skin changes due to chronic exposure to nonionizing radiation: Secondary | ICD-10-CM

## 2021-02-17 DIAGNOSIS — L814 Other melanin hyperpigmentation: Secondary | ICD-10-CM

## 2021-02-17 DIAGNOSIS — D1801 Hemangioma of skin and subcutaneous tissue: Secondary | ICD-10-CM

## 2021-02-17 DIAGNOSIS — D229 Melanocytic nevi, unspecified: Secondary | ICD-10-CM

## 2021-02-17 LAB — COMPREHENSIVE METABOLIC PANEL
ALT: 20 U/L (ref 0–53)
AST: 25 U/L (ref 0–37)
Albumin: 4.5 g/dL (ref 3.5–5.2)
Alkaline Phosphatase: 55 U/L (ref 39–117)
BUN: 23 mg/dL (ref 6–23)
CO2: 30 mEq/L (ref 19–32)
Calcium: 9.5 mg/dL (ref 8.4–10.5)
Chloride: 103 mEq/L (ref 96–112)
Creatinine, Ser: 1.13 mg/dL (ref 0.40–1.50)
GFR: 58.59 mL/min — ABNORMAL LOW (ref >60.00)
Glucose, Bld: 96 mg/dL (ref 70–99)
Potassium: 5 mEq/L (ref 3.5–5.1)
Sodium: 140 mEq/L (ref 135–145)
Total Bilirubin: 0.7 mg/dL (ref 0.2–1.2)
Total Protein: 6.9 g/dL (ref 6.0–8.3)

## 2021-02-17 LAB — LIPID PANEL
Cholesterol: 146 mg/dL (ref 0–200)
HDL: 43.1 mg/dL (ref >39.00)
LDL Cholesterol: 73 mg/dL (ref 0–99)
NonHDL: 102.45
Total CHOL/HDL Ratio: 3
Triglycerides: 149 mg/dL (ref 0.0–149.0)
VLDL: 29.8 mg/dL (ref 0.0–40.0)

## 2021-02-17 LAB — URIC ACID: Uric Acid, Serum: 3.9 mg/dL — ABNORMAL LOW (ref 4.0–7.8)

## 2021-02-17 NOTE — Progress Notes (Signed)
Follow-Up Visit   Subjective  [name] is a 87 y.o. male who presents for the following: Actinic Keratosis (Face, arms, 19m f/u), scaly spot (Back, not sure how long it has been there), and Upper body skin exam (Hx of AKs). The patient presents for Upper Body Skin Exam (UBSE) for skin cancer screening and mole check.   The following portions of the chart were reviewed this encounter and updated as appropriate:   Tobacco  Allergies  Meds  Problems  Med Hx  Surg Hx  Fam Hx     Review of Systems:  No other skin or systemic complaints except as noted in HPI or Assessment and Plan.  Objective  Well appearing patient in no apparent distress; mood and affect are within normal limits.  All skin waist up examined.  face x 8 (8) Pink scaly macules   trunk, arms, hands x 22, Total = 22 (22) Erythematous keratotic or waxy stuck-on papule or plaque.    Assessment & Plan  AK (actinic keratosis) (8) face x 8  Destruction of lesion - face x 8 Complexity: simple   Destruction method: cryotherapy   Informed consent: discussed and consent obtained   Timeout:  patient name, date of birth, surgical site, and procedure verified Lesion destroyed using liquid nitrogen: Yes   Region frozen until ice ball extended beyond lesion: Yes   Outcome: patient tolerated procedure well with no complications   Post-procedure details: wound care instructions given    Inflamed seborrheic keratosis trunk, arms, hands x 22, Total = 22  Destruction of lesion - trunk, arms, hands x 22, Total = 22 Complexity: simple   Destruction method: cryotherapy   Informed consent: discussed and consent obtained   Timeout:  patient name, date of birth, surgical site, and procedure verified Lesion destroyed using liquid nitrogen: Yes   Region frozen until ice ball extended beyond lesion: Yes   Outcome: patient tolerated procedure well with no complications   Post-procedure details: wound care instructions  given    Skin cancer screening  Lentigines - Scattered tan macules - Due to sun exposure - Benign-appearing, observe - Recommend daily broad spectrum sunscreen SPF 30+ to sun-exposed areas, reapply every 2 hours as needed. - Call for any changes  Seborrheic Keratoses - Stuck-on, waxy, tan-brown papules and/or plaques  - Benign-appearing - Discussed benign etiology and prognosis. - Observe - Call for any changes  Melanocytic Nevi - Tan-brown and/or pink-flesh-colored symmetric macules and papules - Benign appearing on exam today - Observation - Call clinic for new or changing moles - Recommend daily use of broad spectrum spf 30+ sunscreen to sun-exposed areas.   Hemangiomas - Red papules - Discussed benign nature - Observe - Call for any changes  Actinic Damage - chronic, secondary to cumulative UV radiation exposure/sun exposure over time - diffuse scaly erythematous macules with underlying dyspigmentation - Recommend daily broad spectrum sunscreen SPF 30+ to sun-exposed areas, reapply every 2 hours as needed.  - Recommend staying in the shade or wearing long sleeves, sun glasses (UVA+UVB protection) and wide brim hats (4-inch brim around the entire circumference of the hat). - Call for new or changing lesions.   Skin cancer screening performed today.  Return in about 6 months (around 08/18/2021) for UBSE, Hx of AKs.  I, Othelia Pulling, RMA, am acting as scribe for Sarina Ser, MD . Documentation: I have reviewed the above documentation for accuracy and completeness, and I agree with the above.  Sarina Ser, MD

## 2021-02-17 NOTE — Patient Instructions (Addendum)

## 2021-02-22 ENCOUNTER — Encounter: Payer: Self-pay | Admitting: Dermatology

## 2021-03-08 ENCOUNTER — Other Ambulatory Visit: Payer: Self-pay | Admitting: Family Medicine

## 2021-03-08 DIAGNOSIS — M9905 Segmental and somatic dysfunction of pelvic region: Secondary | ICD-10-CM | POA: Diagnosis not present

## 2021-03-08 DIAGNOSIS — M955 Acquired deformity of pelvis: Secondary | ICD-10-CM | POA: Diagnosis not present

## 2021-03-08 DIAGNOSIS — M9903 Segmental and somatic dysfunction of lumbar region: Secondary | ICD-10-CM | POA: Diagnosis not present

## 2021-03-08 DIAGNOSIS — M5416 Radiculopathy, lumbar region: Secondary | ICD-10-CM | POA: Diagnosis not present

## 2021-03-09 NOTE — Telephone Encounter (Signed)
Donepezil Last filled:  11/14/20, #90 Last OV:  02/16/21, AWV Next OV:  08/24/21, 6 mo f/u

## 2021-03-29 DIAGNOSIS — M5416 Radiculopathy, lumbar region: Secondary | ICD-10-CM | POA: Diagnosis not present

## 2021-03-29 DIAGNOSIS — M9905 Segmental and somatic dysfunction of pelvic region: Secondary | ICD-10-CM | POA: Diagnosis not present

## 2021-03-29 DIAGNOSIS — M955 Acquired deformity of pelvis: Secondary | ICD-10-CM | POA: Diagnosis not present

## 2021-03-29 DIAGNOSIS — M9903 Segmental and somatic dysfunction of lumbar region: Secondary | ICD-10-CM | POA: Diagnosis not present

## 2021-04-06 DIAGNOSIS — M9905 Segmental and somatic dysfunction of pelvic region: Secondary | ICD-10-CM | POA: Diagnosis not present

## 2021-04-06 DIAGNOSIS — M955 Acquired deformity of pelvis: Secondary | ICD-10-CM | POA: Diagnosis not present

## 2021-04-06 DIAGNOSIS — M5416 Radiculopathy, lumbar region: Secondary | ICD-10-CM | POA: Diagnosis not present

## 2021-04-06 DIAGNOSIS — M9903 Segmental and somatic dysfunction of lumbar region: Secondary | ICD-10-CM | POA: Diagnosis not present

## 2021-04-12 DIAGNOSIS — M9905 Segmental and somatic dysfunction of pelvic region: Secondary | ICD-10-CM | POA: Diagnosis not present

## 2021-04-12 DIAGNOSIS — M5416 Radiculopathy, lumbar region: Secondary | ICD-10-CM | POA: Diagnosis not present

## 2021-04-12 DIAGNOSIS — M9903 Segmental and somatic dysfunction of lumbar region: Secondary | ICD-10-CM | POA: Diagnosis not present

## 2021-04-12 DIAGNOSIS — M955 Acquired deformity of pelvis: Secondary | ICD-10-CM | POA: Diagnosis not present

## 2021-04-14 DIAGNOSIS — M955 Acquired deformity of pelvis: Secondary | ICD-10-CM | POA: Diagnosis not present

## 2021-04-14 DIAGNOSIS — M5416 Radiculopathy, lumbar region: Secondary | ICD-10-CM | POA: Diagnosis not present

## 2021-04-14 DIAGNOSIS — M9903 Segmental and somatic dysfunction of lumbar region: Secondary | ICD-10-CM | POA: Diagnosis not present

## 2021-04-14 DIAGNOSIS — M9905 Segmental and somatic dysfunction of pelvic region: Secondary | ICD-10-CM | POA: Diagnosis not present

## 2021-04-18 DIAGNOSIS — M9903 Segmental and somatic dysfunction of lumbar region: Secondary | ICD-10-CM | POA: Diagnosis not present

## 2021-04-18 DIAGNOSIS — M955 Acquired deformity of pelvis: Secondary | ICD-10-CM | POA: Diagnosis not present

## 2021-04-18 DIAGNOSIS — M9905 Segmental and somatic dysfunction of pelvic region: Secondary | ICD-10-CM | POA: Diagnosis not present

## 2021-04-18 DIAGNOSIS — M5416 Radiculopathy, lumbar region: Secondary | ICD-10-CM | POA: Diagnosis not present

## 2021-04-26 DIAGNOSIS — M9905 Segmental and somatic dysfunction of pelvic region: Secondary | ICD-10-CM | POA: Diagnosis not present

## 2021-04-26 DIAGNOSIS — M5416 Radiculopathy, lumbar region: Secondary | ICD-10-CM | POA: Diagnosis not present

## 2021-04-26 DIAGNOSIS — M955 Acquired deformity of pelvis: Secondary | ICD-10-CM | POA: Diagnosis not present

## 2021-04-26 DIAGNOSIS — M9903 Segmental and somatic dysfunction of lumbar region: Secondary | ICD-10-CM | POA: Diagnosis not present

## 2021-05-03 DIAGNOSIS — M9903 Segmental and somatic dysfunction of lumbar region: Secondary | ICD-10-CM | POA: Diagnosis not present

## 2021-05-03 DIAGNOSIS — M9905 Segmental and somatic dysfunction of pelvic region: Secondary | ICD-10-CM | POA: Diagnosis not present

## 2021-05-03 DIAGNOSIS — M955 Acquired deformity of pelvis: Secondary | ICD-10-CM | POA: Diagnosis not present

## 2021-05-03 DIAGNOSIS — M5416 Radiculopathy, lumbar region: Secondary | ICD-10-CM | POA: Diagnosis not present

## 2021-05-12 DIAGNOSIS — M5416 Radiculopathy, lumbar region: Secondary | ICD-10-CM | POA: Diagnosis not present

## 2021-05-12 DIAGNOSIS — M9903 Segmental and somatic dysfunction of lumbar region: Secondary | ICD-10-CM | POA: Diagnosis not present

## 2021-05-12 DIAGNOSIS — M955 Acquired deformity of pelvis: Secondary | ICD-10-CM | POA: Diagnosis not present

## 2021-05-12 DIAGNOSIS — M9905 Segmental and somatic dysfunction of pelvic region: Secondary | ICD-10-CM | POA: Diagnosis not present

## 2021-05-16 ENCOUNTER — Other Ambulatory Visit: Payer: Self-pay

## 2021-05-16 DIAGNOSIS — M9905 Segmental and somatic dysfunction of pelvic region: Secondary | ICD-10-CM | POA: Diagnosis not present

## 2021-05-16 DIAGNOSIS — M955 Acquired deformity of pelvis: Secondary | ICD-10-CM | POA: Diagnosis not present

## 2021-05-16 DIAGNOSIS — M5416 Radiculopathy, lumbar region: Secondary | ICD-10-CM | POA: Diagnosis not present

## 2021-05-16 DIAGNOSIS — M9903 Segmental and somatic dysfunction of lumbar region: Secondary | ICD-10-CM | POA: Diagnosis not present

## 2021-05-16 NOTE — Telephone Encounter (Signed)
Request from pharmacy for refill request for Allopurinol. Last filled on 02/17/20 #180 with 3 refills LOV AWV 02/16/21 Next appointment on 08/24/21

## 2021-05-19 MED ORDER — ALLOPURINOL 100 MG PO TABS: ORAL_TABLET | ORAL | 0 refills | Status: DC

## 2021-05-19 NOTE — Telephone Encounter (Signed)
ERx 

## 2021-05-20 ENCOUNTER — Encounter: Payer: Self-pay | Admitting: Emergency Medicine

## 2021-05-20 ENCOUNTER — Emergency Department: Payer: Medicare Other

## 2021-05-20 ENCOUNTER — Other Ambulatory Visit: Payer: Self-pay

## 2021-05-20 ENCOUNTER — Emergency Department
Admission: EM | Admit: 2021-05-20 | Discharge: 2021-05-20 | Disposition: A | Discharge status: Home / Self Care | Payer: Medicare Other | Attending: Emergency Medicine | Admitting: Emergency Medicine

## 2021-05-20 DIAGNOSIS — Z7982 Long term (current) use of aspirin: Secondary | ICD-10-CM | POA: Diagnosis not present

## 2021-05-20 DIAGNOSIS — W010XXA Fall on same level from slipping, tripping and stumbling without subsequent striking against object, initial encounter: Secondary | ICD-10-CM | POA: Insufficient documentation

## 2021-05-20 DIAGNOSIS — R079 Chest pain, unspecified: Secondary | ICD-10-CM | POA: Diagnosis not present

## 2021-05-20 DIAGNOSIS — Y9289 Other specified places as the place of occurrence of the external cause: Secondary | ICD-10-CM | POA: Diagnosis not present

## 2021-05-20 DIAGNOSIS — I10 Essential (primary) hypertension: Secondary | ICD-10-CM | POA: Insufficient documentation

## 2021-05-20 DIAGNOSIS — M545 Low back pain, unspecified: Secondary | ICD-10-CM | POA: Insufficient documentation

## 2021-05-20 DIAGNOSIS — M4126 Other idiopathic scoliosis, lumbar region: Secondary | ICD-10-CM | POA: Diagnosis not present

## 2021-05-20 DIAGNOSIS — M546 Pain in thoracic spine: Secondary | ICD-10-CM | POA: Insufficient documentation

## 2021-05-20 DIAGNOSIS — M791 Myalgia, unspecified site: Secondary | ICD-10-CM | POA: Diagnosis not present

## 2021-05-20 DIAGNOSIS — Z79899 Other long term (current) drug therapy: Secondary | ICD-10-CM | POA: Insufficient documentation

## 2021-05-20 DIAGNOSIS — M7918 Myalgia, other site: Secondary | ICD-10-CM

## 2021-05-20 MED ORDER — NAPROXEN SODIUM 275 MG PO TABS: 275.0000 mg | ORAL_TABLET | Freq: Two times a day (BID) | ORAL | 0 refills | Status: DC

## 2021-05-20 MED ORDER — OXYCODONE-ACETAMINOPHEN 5-325 MG PO TABS: 1.0000 | ORAL_TABLET | Freq: Four times a day (QID) | ORAL | 0 refills | Status: DC | PRN

## 2021-05-20 MED ORDER — LIDOCAINE 5 % EX PTCH
1.0000 | MEDICATED_PATCH | CUTANEOUS | Status: DC
  Administered 2021-05-20: 1 via TRANSDERMAL
  Filled 2021-05-20: qty 1

## 2021-05-20 NOTE — ED Triage Notes (Signed)
Fall Saturday evening, tripped on son's dog.  Fell backward onto [employer], contracting. No LOC.  Denies head injury.  C/O low/ mid back pain and rib pain

## 2021-05-20 NOTE — ED Notes (Signed)
See triage note  presents with mid/upper back pain  states he fell about 1 week ago  states he did a flip  pain became worse yesterday  ambulates slowly d/t pain

## 2021-05-20 NOTE — ED Provider Notes (Signed)
[facility] Emergency Department Provider Note   ____________________________________________   Event Date/Time   First MD Initiated Contact with Patient 05/20/21 [id number]     (approximate)  I have reviewed the triage vital signs and the nursing notes.   HISTORY  Chief Complaint Fall    HPI [name] is a 88 y.o. male patient complain of upper and low back pain secondary to a fall 4 days ago.  Denies LOC.  Stated no improvement with conservative treatment consist of anti-inflammatories, Lidoderm, and muscle relaxants.  Denies radicular component to pain.  Denies bladder or bowel dysfunction.  History of chronic back pain.  Rates pain a 7/10.  Described pain as "aching".  History of osteoarthritis.         Past Medical History:  Diagnosis Date   Actinic keratosis    Carotid stenosis, asymptomatic, bilateral 09/16/2016   Diffuse 1-39% bilaterally, but involving ICA, ECA, CCA - rpt 1 yr (05/2456)   Complication of anesthesia    slow to wake up after a colonoscopy years ago   Diverticulitis    GERD (gastroesophageal reflux disease)    Gout    History of osteomyelitis 10/2015   left foot   History of squamous cell carcinoma of skin    HTN (hypertension)    OSA on CPAP 09/01/2016   Osteoarthritis    back, neck, knees - multiple joints   Personal history of malignant neoplasm of larynx 2009   s/p XRT, Skin Cancer- "pre cancerous"   Pre-diabetes    Seasonal allergies    Sensorineural hearing loss (SNHL) of both ears 09/01/2016   Squamous cell carcinoma of skin unknown   Treated in [place] - SCC of the L post auricular     Patient Active Problem List   Diagnosis Date Noted   Health maintenance examination 09/98/3382   Systolic murmur 50/53/9767   Allergic rhinitis 10/12/2020   MCI (mild cognitive impairment) 02/17/2020   Gassiness 02/17/2020   Amputation of one or more toes ([place]) 08/20/2019   Closed displaced fracture of distal phalanx of  right thumb 05/31/2019   Medicare annual wellness visit, subsequent 02/12/2019   Pre-ulcerative calluses 12/02/2018   Infection of left great toe due to methicillin resistant Staphylococcus aureus (MRSA) 12/02/2018   Advanced care planning/counseling discussion 09/04/2017   Dyslipidemia 09/04/2017   Limb cramps 03/07/2017   Carotid stenosis, asymptomatic, bilateral 09/16/2016   Transaminitis 09/01/2016   OSA on CPAP 09/01/2016   Sensorineural hearing loss (SNHL) of both ears 09/01/2016   Obesity, Class I, BMI 30.0-34.9 (see actual BMI) 09/01/2016   GERD (gastroesophageal reflux disease)    Gout    HTN (hypertension)    Osteoarthritis    History of osteomyelitis 2018   Personal history of malignant neoplasm of larynx 05/09/2007    Past Surgical History:  Procedure Laterality Date   ACHILLES TENDON REPAIR Left    AMPUTATION Left 05/16/2019   LEFT GREAT TOE AMPUTATION for osteomyelitis Sharol Given, Illene Regulus, MD), 2nd toe removed   APPENDECTOMY     COLONOSCOPY     THROAT SURGERY N/A 2009   ?vocal cord vs larynx nodule removal, had XRT after this but states it was not cancer   TONSILLECTOMY     TREATMENT FISTULA ANAL      Prior to Admission medications   Medication Sig Start Date End Date Taking? Authorizing Provider  naproxen sodium (ANAPROX) 275 MG tablet Take 1 tablet (275 mg total) by mouth 2 (two) times  daily with a meal. 05/20/21  Yes Sable Feil, PA-C  oxyCODONE-acetaminophen (PERCOCET) 5-325 MG tablet Take 1 tablet by mouth every 6 (six) hours as needed for severe pain. 05/20/21 05/20/22 Yes Sable Feil, PA-C  acetaminophen (TYLENOL) 500 MG tablet Take 1-2 tablets (500-1,000 mg total) by mouth 2 (two) times daily as needed for moderate pain. Patient taking differently: Take 500-1,000 mg by mouth 2 (two) times daily as needed for moderate pain. Pt states taking 3 tablets 02/17/20   Ria Bush, MD  allopurinol (ZYLOPRIM) 100 MG tablet TAKE 2 TABLETS(200 MG) BY MOUTH DAILY  05/19/21   Ria Bush, MD  aspirin EC 81 MG tablet Take 81 mg by mouth daily.    [provider]  azelastine (ASTELIN) 0.1 % nasal spray Place 1-2 sprays into both nostrils 2 (two) times daily. Use in each nostril as directed 10/12/20   Ria Bush, MD  carboxymethylcellulose (REFRESH TEARS) 0.5 % SOLN Place 1 drop into both eyes daily as needed (dry eyes). Patient not taking: Reported on 02/16/2021    [provider]  cetirizine (ZYRTEC) 10 MG tablet Take 10 mg by mouth daily. Alternates weekly with allegra.    [provider]  donepezil (ARICEPT) 5 MG tablet TAKE 1 TABLET(5 MG) BY MOUTH AT BEDTIME 03/10/21   Ria Bush, MD  esomeprazole (Plain) 20 MG capsule TAKE ONE CAPSULE BY MOUTH DAILY AT 12:00 PM 03/09/21   Ria Bush, MD  fexofenadine (ALLEGRA) 180 MG tablet Take 180 mg by mouth daily. Alternates weekly with zyrtec.    [provider]  fluticasone (FLONASE) 50 MCG/ACT nasal spray Place 2 sprays into both nostrils daily. 03/01/20   Elby Beck, FNP  GLUCOSAMINE-CHONDROITIN PO Take 2 tablets by mouth daily.    [provider]  losartan (COZAAR) 100 MG tablet TAKE 1 TABLET(100 MG) BY MOUTH DAILY 03/09/21   Ria Bush, MD  Magnesium 250 MG TABS Take 1 tablet (250 mg total) by mouth daily. Patient taking differently: Take 2 tablets by mouth daily. 07/05/20   Ria Bush, MD  metoprolol succinate (TOPROL-XL) 25 MG 24 hr tablet TAKE 1 TABLET BY MOUTH DAILY WITH OR IMMEDIATELY FOLLOWING A MEAL 03/09/21   Ria Bush, MD  Misc Natural Products (OSTEO BI-FLEX TRIPLE STRENGTH PO) Take by mouth.    [provider]  Multiple Vitamins-Minerals (MULTIVITAMIN ADULT PO) Take 1 tablet by mouth daily.    [provider]  polyethylene glycol (MIRALAX / GLYCOLAX) packet Take 8.5 g by mouth daily.     [provider]  Potassium 99 MG TABS Take 99 mg by mouth daily.    [provider]     Allergies Lisinopril  Family History  Problem Relation Age of Onset   Hypertension Mother    Hypertension Father    Cancer Other        unknown - niece   CAD Sister 44       stent (80% blockage)   CAD Brother 33       stent (100% blockage)    Social History Social History   Tobacco Use   Smoking status: Never   Smokeless tobacco: Never  Vaping Use   Vaping Use: Never used  Substance Use Topics   Alcohol use: Yes    Comment: occasional wine   Drug use: No    Review of Systems Constitutional: No fever/chills Eyes: No visual changes. ENT: No sore throat. Cardiovascular: Denies chest pain. Respiratory: Denies shortness of breath. Gastrointestinal: No  abdominal pain.  No nausea, no vomiting.  No diarrhea.  No constipation. Genitourinary: Negative for dysuria. Musculoskeletal: Negative for back pain. Skin: Negative for rash. Neurological: Negative for headaches, focal weakness or numbness. Endocrine: GERD, hyperlipidemia, hypertension Hematological/Lymphatic:  Allergic/Immunilogical: Lisinopril ____________________________________________   PHYSICAL EXAM:  VITAL SIGNS: ED Triage Vitals  Enc Vitals Group     BP 05/20/21 0821 (!) 161/85     Pulse Rate 05/20/21 0821 65     Resp 05/20/21 0821 16     Temp 05/20/21 0821 (!) 97.5 F (36.4 C)     Temp Source 05/20/21 0821 Oral     SpO2 05/20/21 0821 95 %     Weight 05/20/21 0820 218 lb 7.6 oz (99.1 kg)     Height 05/20/21 0820 5\' 8"  (1.727 m)     Head Circumference --      Peak Flow --      Pain Score 05/20/21 0820 7     Pain Loc --      Pain Edu? --      Excl. in [place]? --     Constitutional: Alert and oriented.  Moderate distress. Eyes: Conjunctivae are normal. PERRL. EOMI. Head: Atraumatic. Nose: No congestion/rhinnorhea. Mouth/Throat: Mucous membranes are moist.  Oropharynx non-erythematous. Neck: No stridor.  No cervical spine tenderness to palpation. Hematological/Lymphatic/Immunilogical: No  cervical lymphadenopathy. Cardiovascular: Normal rate, regular rhythm. Grossly normal heart sounds.  Good peripheral circulation.  Repeat blood pressure. Respiratory: Normal respiratory effort.  No retractions. Lungs CTAB. Gastrointestinal: Soft and nontender. No distention. No abdominal bruits. No CVA tenderness. Genitourinary: Deferred Musculoskeletal: Sits and stands with heavy lines on upper extremities.  No obvious lumbar spine deformity.  Moderate guarding palpation of L3-S1.  No lower extremity tenderness nor edema.  No joint effusions. Neurologic:  Normal speech and language. No gross focal neurologic deficits are appreciated. No gait instability. Skin:  Skin is warm, dry and intact. No rash noted.  Ecchymosis L3-L5. Psychiatric: Mood and affect are normal. Speech and behavior are normal.  ____________________________________________   LABS (all labs ordered are listed, but only abnormal results are displayed)  Labs Reviewed - No data to display ____________________________________________  EKG   ____________________________________________  RADIOLOGY I, Sable Feil, personally viewed and evaluated these images (plain radiographs) as part of my medical decision making, as well as reviewing the written report by the radiologist.  ED MD interpretation: No acute findings on x-ray of the chest and lumbar spine.  Official radiology report(s): DG Chest 2 View  Result Date: 05/20/2021 CLINICAL DATA:  Tripped over dog and fell 6 days ago landing on concrete, chest and lower back pain EXAM: CHEST - 2 VIEW COMPARISON:  None FINDINGS: Normal heart size, mediastinal contours, and pulmonary vascularity. Atherosclerotic calcification aorta. Question RIGHT nipple shadow. Minimal peribronchial thickening. No pulmonary infiltrate, pleural effusion, or pneumothorax. Multiple old LEFT rib fractures. IMPRESSION: Minimal bronchitic changes with question RIGHT nipple shadow; repeat PA chest  radiograph with nipple markers recommended. Aortic Atherosclerosis (ICD10-I70.0). Electronically Signed   By: Lavonia Dana M.D.   On: 05/20/2021 09:45   DG Lumbar Spine 2-3 Views  Result Date: 05/20/2021 CLINICAL DATA:  Tripped over dog and fell 6 days ago, low back pain, chest pain EXAM: LUMBAR SPINE - 2-3 VIEW COMPARISON:  None FINDINGS: 5 non-rib-bearing lumbar vertebra. Biconvex thoracolumbar scoliosis. Multilevel disc space narrowing and endplate spur formation. Mild retrolisthesis L2-L3. Vertebral body heights maintained without fracture, additional subluxation, or bone destruction. Multilevel facet degenerative changes. Atherosclerotic calcifications aorta and  iliac arteries. IMPRESSION: Osseous demineralization with scoliosis and significant degenerative disc/facet disease changes of lumbar spine. No acute abnormalities. Electronically Signed   By: Lavonia Dana M.D.   On: 05/20/2021 09:46    ____________________________________________   PROCEDURES  Procedure(s) performed (including Critical Care):  Procedures   ____________________________________________   INITIAL IMPRESSION / ASSESSMENT AND PLAN / ED COURSE  As part of my medical decision making, I reviewed the following data within the [place] back pain secondary to fall.  Discussed no acute findings on x-ray of the chest and lumbar spine.  Patient given discharge care instruction prescription for Percocet and naproxen.  Advised to follow-up with PCP.             ____________________________________________   FINAL CLINICAL IMPRESSION(S) / ED DIAGNOSES  Final diagnoses:  Myalgia, multiple sites     ED Discharge Orders          Ordered    oxyCODONE-acetaminophen (PERCOCET) 5-325 MG tablet  Every 6 hours PRN        05/20/21 1053    naproxen sodium (ANAPROX) 275 MG tablet  2 times daily with meals        05/20/21 1053             Note:  This document was prepared using Dragon  voice recognition software and may include unintentional dictation errors.    Sable Feil, PA-C 05/20/21 1059    Arta Silence, MD 05/20/21 1335

## 2021-05-20 NOTE — Discharge Instructions (Addendum)
No acute findings on x-ray of the chest and back.  Read and follow discharge care instructions.  Wear Lidoderm patch for 12 hours on the lower back.  Be advised pain medication will cause drowsiness.

## 2021-05-27 DIAGNOSIS — M9903 Segmental and somatic dysfunction of lumbar region: Secondary | ICD-10-CM | POA: Diagnosis not present

## 2021-05-27 DIAGNOSIS — M5416 Radiculopathy, lumbar region: Secondary | ICD-10-CM | POA: Diagnosis not present

## 2021-05-27 DIAGNOSIS — M955 Acquired deformity of pelvis: Secondary | ICD-10-CM | POA: Diagnosis not present

## 2021-05-27 DIAGNOSIS — M9905 Segmental and somatic dysfunction of pelvic region: Secondary | ICD-10-CM | POA: Diagnosis not present

## 2021-05-31 DIAGNOSIS — M955 Acquired deformity of pelvis: Secondary | ICD-10-CM | POA: Diagnosis not present

## 2021-05-31 DIAGNOSIS — M9905 Segmental and somatic dysfunction of pelvic region: Secondary | ICD-10-CM | POA: Diagnosis not present

## 2021-05-31 DIAGNOSIS — M9903 Segmental and somatic dysfunction of lumbar region: Secondary | ICD-10-CM | POA: Diagnosis not present

## 2021-05-31 DIAGNOSIS — M5416 Radiculopathy, lumbar region: Secondary | ICD-10-CM | POA: Diagnosis not present

## 2021-06-02 DIAGNOSIS — M9905 Segmental and somatic dysfunction of pelvic region: Secondary | ICD-10-CM | POA: Diagnosis not present

## 2021-06-02 DIAGNOSIS — M955 Acquired deformity of pelvis: Secondary | ICD-10-CM | POA: Diagnosis not present

## 2021-06-02 DIAGNOSIS — M5416 Radiculopathy, lumbar region: Secondary | ICD-10-CM | POA: Diagnosis not present

## 2021-06-02 DIAGNOSIS — M9903 Segmental and somatic dysfunction of lumbar region: Secondary | ICD-10-CM | POA: Diagnosis not present

## 2021-06-06 DIAGNOSIS — M955 Acquired deformity of pelvis: Secondary | ICD-10-CM | POA: Diagnosis not present

## 2021-06-06 DIAGNOSIS — M5416 Radiculopathy, lumbar region: Secondary | ICD-10-CM | POA: Diagnosis not present

## 2021-06-06 DIAGNOSIS — M9903 Segmental and somatic dysfunction of lumbar region: Secondary | ICD-10-CM | POA: Diagnosis not present

## 2021-06-06 DIAGNOSIS — M9905 Segmental and somatic dysfunction of pelvic region: Secondary | ICD-10-CM | POA: Diagnosis not present

## 2021-06-16 ENCOUNTER — Encounter (HOSPITAL_COMMUNITY): Payer: Self-pay | Admitting: Radiology

## 2021-06-20 DIAGNOSIS — M9905 Segmental and somatic dysfunction of pelvic region: Secondary | ICD-10-CM | POA: Diagnosis not present

## 2021-06-20 DIAGNOSIS — M9903 Segmental and somatic dysfunction of lumbar region: Secondary | ICD-10-CM | POA: Diagnosis not present

## 2021-06-20 DIAGNOSIS — M955 Acquired deformity of pelvis: Secondary | ICD-10-CM | POA: Diagnosis not present

## 2021-06-20 DIAGNOSIS — M5416 Radiculopathy, lumbar region: Secondary | ICD-10-CM | POA: Diagnosis not present

## 2021-06-27 DIAGNOSIS — M9903 Segmental and somatic dysfunction of lumbar region: Secondary | ICD-10-CM | POA: Diagnosis not present

## 2021-06-27 DIAGNOSIS — M955 Acquired deformity of pelvis: Secondary | ICD-10-CM | POA: Diagnosis not present

## 2021-06-27 DIAGNOSIS — M9905 Segmental and somatic dysfunction of pelvic region: Secondary | ICD-10-CM | POA: Diagnosis not present

## 2021-06-27 DIAGNOSIS — M5416 Radiculopathy, lumbar region: Secondary | ICD-10-CM | POA: Diagnosis not present

## 2021-06-29 DIAGNOSIS — M955 Acquired deformity of pelvis: Secondary | ICD-10-CM | POA: Diagnosis not present

## 2021-06-29 DIAGNOSIS — M9905 Segmental and somatic dysfunction of pelvic region: Secondary | ICD-10-CM | POA: Diagnosis not present

## 2021-06-29 DIAGNOSIS — M5416 Radiculopathy, lumbar region: Secondary | ICD-10-CM | POA: Diagnosis not present

## 2021-06-29 DIAGNOSIS — M9903 Segmental and somatic dysfunction of lumbar region: Secondary | ICD-10-CM | POA: Diagnosis not present

## 2021-07-04 DIAGNOSIS — M5416 Radiculopathy, lumbar region: Secondary | ICD-10-CM | POA: Diagnosis not present

## 2021-07-04 DIAGNOSIS — M9905 Segmental and somatic dysfunction of pelvic region: Secondary | ICD-10-CM | POA: Diagnosis not present

## 2021-07-04 DIAGNOSIS — M955 Acquired deformity of pelvis: Secondary | ICD-10-CM | POA: Diagnosis not present

## 2021-07-04 DIAGNOSIS — M9903 Segmental and somatic dysfunction of lumbar region: Secondary | ICD-10-CM | POA: Diagnosis not present

## 2021-07-12 DIAGNOSIS — M955 Acquired deformity of pelvis: Secondary | ICD-10-CM | POA: Diagnosis not present

## 2021-07-12 DIAGNOSIS — M5416 Radiculopathy, lumbar region: Secondary | ICD-10-CM | POA: Diagnosis not present

## 2021-07-12 DIAGNOSIS — M9903 Segmental and somatic dysfunction of lumbar region: Secondary | ICD-10-CM | POA: Diagnosis not present

## 2021-07-12 DIAGNOSIS — M9905 Segmental and somatic dysfunction of pelvic region: Secondary | ICD-10-CM | POA: Diagnosis not present

## 2021-07-19 DIAGNOSIS — M9905 Segmental and somatic dysfunction of pelvic region: Secondary | ICD-10-CM | POA: Diagnosis not present

## 2021-07-19 DIAGNOSIS — M9903 Segmental and somatic dysfunction of lumbar region: Secondary | ICD-10-CM | POA: Diagnosis not present

## 2021-07-19 DIAGNOSIS — M955 Acquired deformity of pelvis: Secondary | ICD-10-CM | POA: Diagnosis not present

## 2021-07-19 DIAGNOSIS — M5416 Radiculopathy, lumbar region: Secondary | ICD-10-CM | POA: Diagnosis not present

## 2021-08-02 DIAGNOSIS — M9903 Segmental and somatic dysfunction of lumbar region: Secondary | ICD-10-CM | POA: Diagnosis not present

## 2021-08-02 DIAGNOSIS — M955 Acquired deformity of pelvis: Secondary | ICD-10-CM | POA: Diagnosis not present

## 2021-08-02 DIAGNOSIS — M5416 Radiculopathy, lumbar region: Secondary | ICD-10-CM | POA: Diagnosis not present

## 2021-08-02 DIAGNOSIS — M9905 Segmental and somatic dysfunction of pelvic region: Secondary | ICD-10-CM | POA: Diagnosis not present

## 2021-08-18 ENCOUNTER — Ambulatory Visit: Payer: Medicare Other | Admitting: Dermatology

## 2021-08-18 ENCOUNTER — Encounter: Payer: Self-pay | Admitting: Dermatology

## 2021-08-18 DIAGNOSIS — L821 Other seborrheic keratosis: Secondary | ICD-10-CM

## 2021-08-18 DIAGNOSIS — Z1283 Encounter for screening for malignant neoplasm of skin: Secondary | ICD-10-CM | POA: Diagnosis not present

## 2021-08-18 DIAGNOSIS — Z85828 Personal history of other malignant neoplasm of skin: Secondary | ICD-10-CM | POA: Diagnosis not present

## 2021-08-18 DIAGNOSIS — L814 Other melanin hyperpigmentation: Secondary | ICD-10-CM

## 2021-08-18 DIAGNOSIS — L570 Actinic keratosis: Secondary | ICD-10-CM | POA: Diagnosis not present

## 2021-08-18 DIAGNOSIS — L820 Inflamed seborrheic keratosis: Secondary | ICD-10-CM

## 2021-08-18 DIAGNOSIS — D229 Melanocytic nevi, unspecified: Secondary | ICD-10-CM

## 2021-08-18 DIAGNOSIS — L578 Other skin changes due to chronic exposure to nonionizing radiation: Secondary | ICD-10-CM

## 2021-08-18 DIAGNOSIS — D1800 Hemangioma unspecified site: Secondary | ICD-10-CM

## 2021-08-18 NOTE — Patient Instructions (Addendum)
Cryotherapy Aftercare ? ?Wash gently with soap and water everyday.   ?Apply Vaseline and Band-Aid daily until healed.  ? ?Prior to procedure, discussed risks of blister formation, small wound, skin dyspigmentation, or rare scar following cryotherapy. Recommend Vaseline ointment to treated areas while healing.  ? ? ?Recommend daily broad spectrum sunscreen SPF 30+ to sun-exposed areas, reapply every 2 hours as needed. Call for new or changing lesions.  ?Staying in the shade or wearing long sleeves, sun glasses (UVA+UVB protection) and wide brim hats (4-inch brim around the entire circumference of the hat) are also recommended for sun protection.  ? ? ? ?Melanoma ABCDEs ? ?Melanoma is the most dangerous type of skin cancer, and is the leading cause of death from skin disease.  You are more likely to develop melanoma if you: ?Have light-colored skin, light-colored eyes, or red or blond hair ?Spend a lot of time in the sun ?Tan regularly, either outdoors or in a tanning bed ?Have had blistering sunburns, especially during childhood ?Have a close family member who has had a melanoma ?Have atypical moles or large birthmarks ? ?Early detection of melanoma is key since treatment is typically straightforward and cure rates are extremely high if we catch it early.  ? ?The first sign of melanoma is often a change in a mole or a new dark spot.  The ABCDE system is a way of remembering the signs of melanoma. ? ?A for asymmetry:  The two halves do not match. ?B for border:  The edges of the growth are irregular. ?C for color:  A mixture of colors are present instead of an even brown color. ?D for diameter:  Melanomas are usually (but not always) greater than 29m - the size of a pencil eraser. ?E for evolution:  The spot keeps changing in size, shape, and color. ? ?Please check your skin once per month between visits. You can use a small mirror in front and a large mirror behind you to keep an eye on the back side or your body.   ? ?If you see any new or changing lesions before your next follow-up, please call to schedule a visit. ? ?Please continue daily skin protection including broad spectrum sunscreen SPF 30+ to sun-exposed areas, reapplying every 2 hours as needed when you're outdoors.  ? ?Staying in the shade or wearing long sleeves, sun glasses (UVA+UVB protection) and wide brim hats (4-inch brim around the entire circumference of the hat) are also recommended for sun protection.   ? ?If You Need Anything After Your Visit ? ?If you have any questions or concerns for your doctor, please call our main line at [phone] press option 4 to reach your doctor's medical assistant. If no one answers, please leave a voicemail as directed and we will return your call as soon as possible. Messages left after 4 pm will be answered the following business day.  ? ?You may also send [place] message via MyChart. We typically respond to MyChart messages within 1-2 business days. ? ?For prescription refills, please ask your pharmacy to contact our office. Our fax number is [phone] ? ?If you have an urgent issue when the clinic is closed that cannot wait until the next business day, you can page your doctor at the number below.   ? ?Please note that while we do our best to be available for urgent issues outside of office hours, we are not available 24/7.  ? ?If you have an urgent issue and  are unable to reach [place], you may choose to seek medical care at your doctor's office, retail clinic, urgent care center, or emergency room. ? ?If you have a medical emergency, please immediately call 911 or go to the emergency department. ? ?Pager Numbers ? ?- Dr. Nehemiah Massed: [phone] ? ?- Dr. Laurence Ferrari: [phone] ? ?- Dr. Nicole Kindred: [phone] ? ?In the event of inclement weather, please call our main line at [phone] for an update on the status of any delays or closures. ? ?Dermatology Medication Tips: ?Please keep the boxes that topical medications  come in in order to help keep track of the instructions about where and how to use these. Pharmacies typically print the medication instructions only on the boxes and not directly on the medication tubes.  ? ?If your medication is too expensive, please contact our office at [phone] option 4 or send [place] a message through [facility].  ? ?We are unable to tell what your co-pay for medications will be in advance as this is different depending on your insurance coverage. However, we may be able to find a substitute medication at lower cost or fill out paperwork to get insurance to cover a needed medication.  ? ?If a prior authorization is required to get your medication covered by your insurance company, please allow [place] 1-2 business days to complete this process. ? ?Drug prices often vary depending on where the prescription is filled and some pharmacies may offer cheaper prices. ? ?The website www.goodrx.com contains coupons for medications through different pharmacies. The prices here do not account for what the cost may be with help from insurance (it may be cheaper with your insurance), but the website can give you the price if you did not use any insurance.  ?- You can print the associated coupon and take it with your prescription to the pharmacy.  ?- You may also stop by our office during regular business hours and pick up a GoodRx coupon card.  ?- If you need your prescription sent electronically to a different pharmacy, notify our office through [facility] or by phone at [phone] option 4. ? ? ? ? ?Si Usted Necesita Algo Despu?s de Su Visita ? ?Tambi?n puede enviarnos un mensaje a trav?s de MyChart. Por lo general respondemos a los mensajes de MyChart en el transcurso de 1 a 2 d?as h?biles. ? ?Para renovar recetas, por favor pida a su farmacia que se ponga en contacto con nuestra oficina. Nuestro n?mero de fax es el [phone]. ? ?Si tiene un asunto urgente cuando la cl?nica est? cerrada y que no  puede esperar hasta el siguiente d?a h?bil, puede llamar/localizar a su doctor(a) al n?mero que aparece a continuaci?n.  ? ?Por favor, tenga en cuenta que aunque hacemos todo lo posible para estar disponibles para asuntos urgentes fuera del horario de oficina, no estamos disponibles las 24 horas del d?a, los 7 d?as de la semana.  ? ?Si tiene un problema urgente y no puede comunicarse con nosotros, puede optar por buscar atenci?n m?dica  en el consultorio de su doctor(a), en una cl?nica privada, en un centro de atenci?n urgente o en una sala de emergencias. ? ?Si tiene Engineer, maintenance (IT) m?dica, por favor llame inmediatamente al 911 o vaya a la sala de emergencias. ? ?N?meros de b?per ? ?- Dr. Nehemiah Massed: [phone] ? ?- Dra. Moye: [phone] ? ?- Dra. Nicole Kindred: [phone] ? ?En caso de inclemencias del tiempo, por favor llame a nuestra l?nea principal al [phone] para una actualizaci?n [employer]  estado de cualquier retraso o cierre. ? ?Consejos para la medicaci?n en dermatolog?a: ?Por favor, guarde las cajas en las que vienen los medicamentos de uso t?pico para ayudarle a seguir las instrucciones sobre d?nde y c?mo usarlos. Las farmacias generalmente imprimen las instrucciones del medicamento s?lo en las cajas y no directamente en los tubos del [place].  ? ?Si su medicamento es muy caro, por favor, p?ngase en contacto con Zigmund Daniel llamando al [phone] y presione la opci?n 4 o env?enos un mensaje a trav?s de MyChart.  ? ?No podemos decirle cu?l ser? su copago por los medicamentos por adelantado ya que esto es diferente dependiendo de la cobertura de su seguro. Sin embargo, es posible que podamos encontrar un medicamento sustituto a Electrical engineer un formulario para que el seguro cubra el medicamento que se considera necesario.  ? ?Si se requiere Ardelia Mems autorizaci?n previa para que su compa??a de seguros [place] su medicamento, por favor perm?tanos de 1 a 2 d?as h?biles para completar este  proceso. ? ?Los precios de los medicamentos var?an con frecuencia dependiendo del Environmental consultant de d?nde se surte la receta y alguna farmacias pueden ofrecer precios m?s baratos. ? ?El sitio web www.goodrx.com tiene cupones

## 2021-08-18 NOTE — Progress Notes (Signed)
? ?Follow-Up Visit ?  ?Subjective  ?[name] is a 88 y.o. male who presents for the following: Annual Exam (Hx of SCC. Has areas of concern). ?The patient presents for Total-Body Skin Exam (TBSE) for skin cancer screening and mole check.  The patient has spots, moles and lesions to be evaluated, some may be new or changing and the patient has concerns that these could be cancer. ? ?The following portions of the chart were reviewed this encounter and updated as appropriate:  Tobacco  Allergies  Meds  Problems  Med Hx  Surg Hx  Fam Hx   ?  ?Review of Systems: No other skin or systemic complaints except as noted in HPI or Assessment and Plan. ? ?Objective  ?Well appearing patient in no apparent distress; mood and affect are within normal limits. ? ?A full examination was performed including scalp, head, eyes, ears, nose, lips, neck, chest, axillae, abdomen, back, buttocks, bilateral upper extremities, bilateral lower extremities, hands, feet, fingers, toes, fingernails, and toenails. All findings within normal limits unless otherwise noted below. ? ?face and ears x8, hands x11 (19) ?Erythematous thin papules/macules with gritty scale.  ? ?left medial elbow x1, right post thigh x1 (2) ?Erythematous keratotic or waxy stuck-on papule or plaque. ? ? ?Assessment & Plan  ? ?Lentigines ?- Scattered tan macules ?- Due to sun exposure ?- Benign-appearing, observe ?- Recommend daily broad spectrum sunscreen SPF 30+ to sun-exposed areas, reapply every 2 hours as needed. ?- Call for any changes ? ?Seborrheic Keratoses ?- Stuck-on, waxy, tan-brown papules and/or plaques  ?- Benign-appearing ?- Discussed benign etiology and prognosis. ?- Observe ?- Call for any changes ? ?Melanocytic Nevi ?- Tan-brown and/or pink-flesh-colored symmetric macules and papules ?- Benign appearing on exam today ?- Observation ?- Call clinic for new or changing moles ?- Recommend daily use of broad spectrum spf 30+ sunscreen to sun-exposed  areas.  ? ?Hemangiomas ?- Red papules ?- Discussed benign nature ?- Observe ?- Call for any changes ? ?Actinic Damage ?- Chronic condition, secondary to cumulative UV/sun exposure ?- diffuse scaly erythematous macules with underlying dyspigmentation ?- Recommend daily broad spectrum sunscreen SPF 30+ to sun-exposed areas, reapply every 2 hours as needed.  ?- Staying in the shade or wearing long sleeves, sun glasses (UVA+UVB protection) and wide brim hats (4-inch brim around the entire circumference of the hat) are also recommended for sun protection.  ?- Call for new or changing lesions. ? ?History of Squamous Cell Carcinoma of the Skin ?- No evidence of recurrence today at left post auricular ?- No lymphadenopathy ?- Recommend regular full body skin exams ?- Recommend daily broad spectrum sunscreen SPF 30+ to sun-exposed areas, reapply every 2 hours as needed.  ?- Call if any new or changing lesions are noted between office visits ? ?Skin cancer screening performed today. ? ?AK (actinic keratosis) (19) ?face and ears x8, hands x11 ? ?Actinic keratoses are precancerous spots that appear secondary to cumulative UV radiation exposure/sun exposure over time. They are chronic with expected duration over 1 year. A portion of actinic keratoses will progress to squamous cell carcinoma of the skin. It is not possible to reliably predict which spots will progress to skin cancer and so treatment is recommended to prevent development of skin cancer. ? ?Recommend daily broad spectrum sunscreen SPF 30+ to sun-exposed areas, reapply every 2 hours as needed.  ?Recommend staying in the shade or wearing long sleeves, sun glasses (UVA+UVB protection) and wide brim hats (4-inch brim around the  entire circumference of the hat). ?Call for new or changing lesions. ? ?Destruction of lesion - face and ears x8, hands x11 ?Complexity: simple   ?Destruction method: cryotherapy   ?Informed consent: discussed and consent obtained   ?Timeout:   patient name, date of birth, surgical site, and procedure verified ?Lesion destroyed using liquid nitrogen: Yes   ?Region frozen until ice ball extended beyond lesion: Yes   ?Outcome: patient tolerated procedure well with no complications   ?Post-procedure details: wound care instructions given   ? ?Inflamed seborrheic keratosis (2) ?left medial elbow x1, right post thigh x1 ? ?Destruction of lesion - left medial elbow x1, right post thigh x1 ?Complexity: simple   ?Destruction method: cryotherapy   ?Informed consent: discussed and consent obtained   ?Timeout:  patient name, date of birth, surgical site, and procedure verified ?Lesion destroyed using liquid nitrogen: Yes   ?Region frozen until ice ball extended beyond lesion: Yes   ?Outcome: patient tolerated procedure well with no complications   ?Post-procedure details: wound care instructions given   ? ?Skin cancer screening ? ? ?Return in about 4 months (around 12/18/2021) for HxSCC, UBSE. ? ?I, Emelia Salisbury, CMA, am acting as scribe for Sarina Ser, MD. ?Documentation: I have reviewed the above documentation for accuracy and completeness, and I agree with the above. ? ?Sarina Ser, MD ? ? ?

## 2021-08-23 DIAGNOSIS — M9905 Segmental and somatic dysfunction of pelvic region: Secondary | ICD-10-CM | POA: Diagnosis not present

## 2021-08-23 DIAGNOSIS — M5416 Radiculopathy, lumbar region: Secondary | ICD-10-CM | POA: Diagnosis not present

## 2021-08-23 DIAGNOSIS — M955 Acquired deformity of pelvis: Secondary | ICD-10-CM | POA: Diagnosis not present

## 2021-08-23 DIAGNOSIS — M9903 Segmental and somatic dysfunction of lumbar region: Secondary | ICD-10-CM | POA: Diagnosis not present

## 2021-08-24 ENCOUNTER — Ambulatory Visit: Payer: Medicare Other | Admitting: Family Medicine

## 2021-08-26 ENCOUNTER — Encounter: Payer: Self-pay | Admitting: Dermatology

## 2021-08-26 ENCOUNTER — Telehealth: Payer: Self-pay

## 2021-08-26 MED ORDER — ALLOPURINOL 100 MG PO TABS: ORAL_TABLET | ORAL | 1 refills | Status: DC

## 2021-08-26 NOTE — Telephone Encounter (Signed)
E-scribed refill 

## 2021-08-31 ENCOUNTER — Encounter: Payer: Self-pay | Admitting: Family Medicine

## 2021-08-31 ENCOUNTER — Ambulatory Visit (INDEPENDENT_AMBULATORY_CARE_PROVIDER_SITE_OTHER): Payer: Medicare Other | Admitting: Family Medicine

## 2021-08-31 VITALS — BP 140/70 | HR 74 | Temp 97.8°F | Ht 68.0 in | Wt 218.5 lb

## 2021-08-31 DIAGNOSIS — G3184 Mild cognitive impairment, so stated: Secondary | ICD-10-CM

## 2021-08-31 DIAGNOSIS — J3089 Other allergic rhinitis: Secondary | ICD-10-CM | POA: Diagnosis not present

## 2021-08-31 DIAGNOSIS — I10 Essential (primary) hypertension: Secondary | ICD-10-CM | POA: Diagnosis not present

## 2021-08-31 MED ORDER — AZELASTINE HCL 0.1 % NA SOLN: 1.0000 | Freq: Two times a day (BID) | NASAL | 6 refills | Status: DC

## 2021-08-31 MED ORDER — DONEPEZIL HCL 10 MG PO TABS
10.0000 mg | ORAL_TABLET | Freq: Every day | ORAL | 1 refills | Status: DC
Start: 2021-08-31 — End: 2021-09-16

## 2021-08-31 NOTE — Assessment & Plan Note (Signed)
Chronic, stable period and he notes benefit with aricept - will increase to '10mg'$  daily, discussed possible side effects of increase.   ?MMSE today 23/30 (previously 24/30), CDT 4/4.  ?

## 2021-08-31 NOTE — Assessment & Plan Note (Signed)
Benefits from OTC antihistamine especially when at work.  ?

## 2021-08-31 NOTE — Assessment & Plan Note (Signed)
Chronic, deteriorated control noted today despite daily losartan '100mg'$  and metoprolol XL '25mg'$  daily. I asked him to start monitoring blood pressures at home, let me know if consistently >140/90 to consider further antihypertensive titration. ?

## 2021-08-31 NOTE — Patient Instructions (Addendum)
Good to see you today. ?Try higher aricept dose '10mg'$  - double up on what you have, new dose will be at pharmacy.  ?Let us know if you have trouble with GI upset or palpitations on higher dose of medicine.  ?Return in 6 months for physical/wellness visit.  ?Start monitoring blood pressures at home as your BP was too high today.  ?

## 2021-08-31 NOTE — Progress Notes (Signed)
? ? Patient ID: [name], male    DOB: 1931/11/16, 88 y.o.   MRN: [id number] ? ?This visit was conducted in person. ? ?BP 140/70   Pulse 74   Temp 97.8 ?F (36.6 ?C) (Temporal)   Ht '5\' 8"'$  (1.727 m)   Wt 218 lb 8 oz (99.1 kg)   SpO2 97%   BMI 33.22 kg/m?   ?Elevated on repeat testing 160/80 ? ?CC: 6 mo f/u visit  ?Subjective:  ? ?HPI: ?[name] is a 88 y.o. male presenting on 08/31/2021 for Follow-up (Here for 6 mo f/u. ) ? ? ?See prior note for details.  ?[name] is here for 6 mo f/u visit.  ?Dog knocked him down 05/2021 s/p ER evaluation. ?Requests BP recheck today.  ?He's been taking cetirizine with benefit.  ? ?[facility] - on low dose aricept '5mg'$  daily. He feels this has significantly helped - more easily remembering grandchildren's names. Denies gi upset, diarrhea, heartburn, or palpitations or skipped heart beats.  ? ?Updated MMSE today - 23/30 ?CDT = 4/4.  ?   ? ?Relevant past medical, surgical, family and social history reviewed and updated as indicated. Interim medical history since our last visit reviewed. ?Allergies and medications reviewed and updated. ?Outpatient Medications Prior to Visit  ?Medication Sig Dispense Refill  ? acetaminophen (TYLENOL) 500 MG tablet Take 1-2 tablets (500-1,000 mg total) by mouth 2 (two) times daily as needed for moderate pain. (Patient taking differently: Take 500-1,000 mg by mouth 2 (two) times daily as needed for moderate pain. Pt states taking 3 tablets)    ? allopurinol (ZYLOPRIM) 100 MG tablet TAKE 2 TABLETS(200 MG) BY MOUTH DAILY 180 tablet 1  ? aspirin EC 81 MG tablet Take 81 mg by mouth daily.    ? carboxymethylcellulose (REFRESH TEARS) 0.5 % SOLN Place 1 drop into both eyes daily as needed (dry eyes).    ? cetirizine (ZYRTEC) 10 MG tablet Take 10 mg by mouth daily. Alternates weekly with allegra.    ? fexofenadine (ALLEGRA) 180 MG tablet Take 180 mg by mouth daily. Alternates weekly with zyrtec.    ? GLUCOSAMINE-CHONDROITIN PO Take 2 tablets by mouth  daily.    ? losartan (COZAAR) 100 MG tablet TAKE 1 TABLET(100 MG) BY MOUTH DAILY 90 tablet 3  ? Magnesium 250 MG TABS Take 1 tablet (250 mg total) by mouth daily. (Patient taking differently: Take 2 tablets by mouth daily.)  0  ? metoprolol succinate (TOPROL-XL) 25 MG 24 hr tablet TAKE 1 TABLET BY MOUTH DAILY WITH OR IMMEDIATELY FOLLOWING A MEAL 90 tablet 3  ? Misc Natural Products (OSTEO BI-FLEX TRIPLE STRENGTH PO) Take by mouth.    ? Multiple Vitamins-Minerals (MULTIVITAMIN ADULT PO) Take 1 tablet by mouth daily.    ? naproxen sodium (ANAPROX) 275 MG tablet Take 1 tablet (275 mg total) by mouth 2 (two) times daily with a meal. (Patient taking differently: Take 275 mg by mouth 2 (two) times daily with a meal. As needed) 20 tablet 0  ? polyethylene glycol (MIRALAX / GLYCOLAX) packet Take 8.5 g by mouth daily.     ? Potassium 99 MG TABS Take 99 mg by mouth daily.    ? azelastine (ASTELIN) 0.1 % nasal spray Place 1-2 sprays into both nostrils 2 (two) times daily. Use in each nostril as directed 30 mL 6  ? donepezil (ARICEPT) 5 MG tablet TAKE 1 TABLET(5 MG) BY MOUTH AT BEDTIME 90 tablet 1  ? esomeprazole (NEXIUM) 20 MG capsule  TAKE ONE CAPSULE BY MOUTH DAILY AT 12:00 PM (Patient not taking: Reported on 08/31/2021) 90 capsule 3  ? fluticasone (FLONASE) 50 MCG/ACT nasal spray Place 2 sprays into both nostrils daily. 16 g 6  ? oxyCODONE-acetaminophen (PERCOCET) 5-325 MG tablet Take 1 tablet by mouth every 6 (six) hours as needed for severe pain. 20 tablet 0  ? ?No facility-administered medications prior to visit.  ?  ? ?Per HPI unless specifically indicated in ROS section below ?Review of Systems ? ?Objective:  ?BP 140/70   Pulse 74   Temp 97.8 ?F (36.6 ?C) (Temporal)   Ht '5\' 8"'$  (1.727 m)   Wt 218 lb 8 oz (99.1 kg)   SpO2 97%   BMI 33.22 kg/m?   ?Wt Readings from Last 3 Encounters:  ?08/31/21 218 lb 8 oz (99.1 kg)  ?05/20/21 218 lb 7.6 oz (99.1 kg)  ?02/16/21 218 lb 7 oz (99.1 kg)  ?  ?  ?Physical Exam ?Vitals and  nursing note reviewed.  ?Constitutional:   ?   Appearance: Normal appearance. He is not ill-appearing.  ?Neurological:  ?   Mental Status: He is alert.  ?Psychiatric:     ?   Mood and Affect: Mood normal.     ?   Behavior: Behavior normal.  ? ?   ?Results for orders placed or performed in visit on 02/16/21  ?Lipid panel  ?Result Value Ref Range  ? Cholesterol 146 0 - 200 mg/dL  ? Triglycerides 149.0 0.0 - 149.0 mg/dL  ? HDL 43.10 >39.00 mg/dL  ? VLDL 29.8 0.0 - 40.0 mg/dL  ? LDL Cholesterol 73 0 - 99 mg/dL  ? Total CHOL/HDL Ratio 3   ? NonHDL 102.45   ?Comprehensive metabolic panel  ?Result Value Ref Range  ? Sodium 140 135 - 145 mEq/L  ? Potassium 5.0 3.5 - 5.1 mEq/L  ? Chloride 103 96 - 112 mEq/L  ? CO2 30 19 - 32 mEq/L  ? Glucose, Bld 96 70 - 99 mg/dL  ? BUN 23 6 - 23 mg/dL  ? Creatinine, Ser 1.13 0.40 - 1.50 mg/dL  ? Total Bilirubin 0.7 0.2 - 1.2 mg/dL  ? Alkaline Phosphatase 55 39 - 117 U/L  ? AST 25 0 - 37 U/L  ? ALT 20 0 - 53 U/L  ? Total Protein 6.9 6.0 - 8.3 g/dL  ? Albumin 4.5 3.5 - 5.2 g/dL  ? GFR 58.59 (L) >60.00 mL/min  ? Calcium 9.5 8.4 - 10.5 mg/dL  ?Uric acid  ?Result Value Ref Range  ? Uric Acid, Serum 3.9 (L) 4.0 - 7.8 mg/dL  ? ? ?Assessment & Plan:  ? ?Problem List Items Addressed This Visit   ? ? HTN (hypertension)  ?  Chronic, deteriorated control noted today despite daily losartan '100mg'$  and metoprolol XL '25mg'$  daily. I asked him to start monitoring blood pressures at home, let me know if consistently >140/90 to consider further antihypertensive titration. ? ?  ?  ? MCI (mild cognitive impairment) - Primary  ?  Chronic, stable period and he notes benefit with aricept - will increase to '10mg'$  daily, discussed possible side effects of increase.   ?MMSE today 23/30 (previously 24/30), CDT 4/4.  ? ?  ?  ? Allergic rhinitis  ?  Benefits from OTC antihistamine especially when at work.  ? ?  ?  ?  ? ?Meds ordered this encounter  ?Medications  ? azelastine (ASTELIN) 0.1 % nasal spray  ?  Sig: Place 1-2  sprays  into both nostrils 2 (two) times daily. Use in each nostril as directed  ?  Dispense:  30 mL  ?  Refill:  6  ? donepezil (ARICEPT) 10 MG tablet  ?  Sig: Take 1 tablet (10 mg total) by mouth at bedtime.  ?  Dispense:  90 tablet  ?  Refill:  1  ? ?No orders of the defined types were placed in this encounter. ? ? ? ?Patient Instructions  ?Good to see you today. ?Try higher aricept dose '10mg'$  - double up on what you have, new dose will be at pharmacy.  ?Let us know if you have trouble with GI upset or palpitations on higher dose of medicine.  ?Return in 6 months for physical/wellness visit.  ?Start monitoring blood pressures at home as your BP was too high today.  ? ?Follow up plan: ?Return in about 6 months (around 03/02/2022) for annual exam, prior fasting for blood work, medicare wellness visit. ? ?Ria Bush, MD   ?

## 2021-09-02 DIAGNOSIS — G4733 Obstructive sleep apnea (adult) (pediatric): Secondary | ICD-10-CM | POA: Diagnosis not present

## 2021-09-02 DIAGNOSIS — I10 Essential (primary) hypertension: Secondary | ICD-10-CM | POA: Diagnosis not present

## 2021-09-07 ENCOUNTER — Telehealth: Payer: Self-pay | Admitting: Family Medicine

## 2021-09-07 MED ORDER — METOPROLOL SUCCINATE ER 50 MG PO TB24: 50.0000 mg | ORAL_TABLET | Freq: Every day | ORAL | 1 refills | Status: DC

## 2021-09-07 NOTE — Telephone Encounter (Signed)
Spoke with wife at her OV today. ?Pt's BPs have been elevated in the past 1+ week.  ?188/100, 170/90s ?Aricept was recently increased to '10mg'$  daily. ? ?Recommend drop aricept back to '5mg'$  dose at least until BPs are better controlled.  ?Continue checking BPs at home call us with log in 1 week.  ?Continue losartan '100mg'$  daily (max dose),  ?Increase toprol XL to '50mg'$  daily (2 tablets until he runs out, then start new '50mg'$  dose sent to pharmacy).  ?

## 2021-09-12 ENCOUNTER — Telehealth: Payer: Self-pay | Admitting: Family Medicine

## 2021-09-12 NOTE — Telephone Encounter (Signed)
Lvm asking pt to call back.  Need to relay Dr. G's message.  

## 2021-09-12 NOTE — Telephone Encounter (Signed)
Plz ensure he continues taking losartan '100mg'$  daily. ?New dose increase was Toprol XL to '50mg'$  daily.  ? ?Ensure limiting salt/sodium and drinking good water.  ?Ensure no recent decongestant use.  ?We may need to add 3rd medication likely amlodipine '5mg'$  daily in am.  ?

## 2021-09-12 NOTE — Telephone Encounter (Signed)
Pt's spouse Natalie Leclaire, wants to speak with the nurse "about his medication and needs to give her his blood pressure readings." ? ?Callback Number: [phone] ?

## 2021-09-12 NOTE — Telephone Encounter (Signed)
Spoke to patient's wife and was advised that her husband was started on Toprol 50 mg. ?BP readings ?09/08/21 171/82 ?09/09/21 187/77 ?08/11/21 179/79 ?09/10/21 160/70 ?09/11/21 199/88, 183/102, 177/81 ?09/12/21 170/78 at 9:10 am after med. ?Patient's wife stated that she just got home and he has been sleeping and just woke up. Patient's wife stated that his face is beet red but denies any symptoms BP 191/91 HR 62. ?Pharmacy [facility]/[place] ? ?

## 2021-09-13 NOTE — Telephone Encounter (Addendum)
Spoke with pt's wife, Vaughan Basta (on dpr), relaying Dr. Synthia Innocent message.  She verbalizes understanding and will inform pt. Pt/pt's wife state he does not take losartan 100 mg daily.  I encouraged pt to take losartan and metoprolol XL 50 mg daily, then call back in about 1 wk with BP readings.  Verbalized understanding.   ?

## 2021-09-13 NOTE — Telephone Encounter (Signed)
Yes, that was the message given to pt/pt's wife.  ?

## 2021-09-13 NOTE — Telephone Encounter (Signed)
To clarify, pt should be on toprol XL '50mg'$  daily and losartan '100mg'$  daily.  ?

## 2021-09-16 ENCOUNTER — Encounter: Payer: Self-pay | Admitting: Family Medicine

## 2021-09-16 ENCOUNTER — Ambulatory Visit (INDEPENDENT_AMBULATORY_CARE_PROVIDER_SITE_OTHER): Payer: Medicare Other | Admitting: Family Medicine

## 2021-09-16 VITALS — BP 176/66 | HR 50 | Temp 97.5°F | Ht 68.0 in | Wt 219.2 lb

## 2021-09-16 DIAGNOSIS — G3184 Mild cognitive impairment, so stated: Secondary | ICD-10-CM

## 2021-09-16 DIAGNOSIS — I10 Essential (primary) hypertension: Secondary | ICD-10-CM

## 2021-09-16 MED ORDER — AMLODIPINE BESYLATE 5 MG PO TABS: 5.0000 mg | ORAL_TABLET | Freq: Every day | ORAL | 3 refills | Status: DC

## 2021-09-16 MED ORDER — DONEPEZIL HCL 5 MG PO TABS
5.0000 mg | ORAL_TABLET | Freq: Every day | ORAL | 6 refills | Status: DC
Start: 2021-09-16 — End: 2022-04-03

## 2021-09-16 NOTE — Patient Instructions (Addendum)
Continue losartan '100mg'$  daily, toprol XL '50mg'$  daily.  ?Add amlodipine '5mg'$  daily.  ?Make sure you're checking blood pressure in a resting state - wait 30 min after eating drinking, exercise, caffeine, anger, pain.  ?Limit sodium/salt in the diet.  ?Drink plenty of water.  ?Return in 2-3 weeks for office visit  ?

## 2021-09-16 NOTE — Telephone Encounter (Addendum)
Patient seen today. ?Please call pt - I forgot to mention - I do want him to continue taking only '5mg'$  donepezil (lower dose sent to pharmacy). Concern higher '10mg'$  dose may have contributed to increased BP.  ?

## 2021-09-16 NOTE — Assessment & Plan Note (Addendum)
HTN issues may have started/deteriorated after recent increase in donepezil (Aricept) - he is now back on '5mg'$  dose - will stay on this dose. ?

## 2021-09-16 NOTE — Assessment & Plan Note (Addendum)
Persistently markedly high readings at home despite losartan and recent increase in Toprol XL, now with some bradycardia. No signs of end organ damage. I will add amlodipine to regimen at '5mg'$  daily. Reviewed correct way to check blood pressures at home. I did ask him to continue BP log to include pulse. ?RTC 2-3 wks f/u visit, rpt EKG and labs at that time. Pt agrees with plan.  ?Worsening HTN may have started after increased donepezil dose to '10mg'$  - now back on '5mg'$  dose.  ?Avoiding thiazide in gout history.  ?

## 2021-09-16 NOTE — Progress Notes (Signed)
? ? Patient ID: [name], male    DOB: 04/30/1932, 88 y.o.   MRN: [id number] ? ?This visit was conducted in person. ? ?BP (!) 176/66 (BP Location: Right Arm, Cuff Size: Normal)   Pulse (!) 50   Temp (!) 97.5 ?F (36.4 ?C) (Temporal)   Ht '5\' 8"'$  (1.727 m)   Wt 219 lb 4 oz (99.5 kg)   SpO2 97%   BMI 33.34 kg/m?   ?BP Readings from Last 3 Encounters:  ?09/16/21 (!) 176/66  ?08/31/21 140/70  ?05/20/21 (!) 161/85  ?R 190/80 ?L 190/78 ? ?CC: BP check  ?Subjective:  ? ?HPI: ?[name] is a 88 y.o. male presenting on 09/16/2021 for Hypertension (Here for BP chk. Provided log of recent home BP readings.  Pt brought in home monitor to compare.  Reading in office today 189/90- R, 211/108- L.) ? ? ?Patient walked into office today asking to be seen for high blood pressures.  ? ?See prior note for details.  ?Currently on losartan '100mg'$  daily and toprol XL '50mg'$  daily (recent increase from '25mg'$ ). He states he's been taking both medications all along.  ? ?Brings BP log - largely too high throughout the last several days (858-850 systolic).  ?Denies HA, vision changes, CP/tightness, SOB, leg swelling.  ? ?Losartan caused cough.  ? ?Twin siblings both with stents.  ?   ? ?Relevant past medical, surgical, family and social history reviewed and updated as indicated. Interim medical history since our last visit reviewed. ?Allergies and medications reviewed and updated. ?Outpatient Medications Prior to Visit  ?Medication Sig Dispense Refill  ? acetaminophen (TYLENOL) 500 MG tablet Take 1-2 tablets (500-1,000 mg total) by mouth 2 (two) times daily as needed for moderate pain. (Patient taking differently: Take 500-1,000 mg by mouth 2 (two) times daily as needed for moderate pain. Pt states taking 3 tablets)    ? allopurinol (ZYLOPRIM) 100 MG tablet TAKE 2 TABLETS(200 MG) BY MOUTH DAILY 180 tablet 1  ? aspirin EC 81 MG tablet Take 81 mg by mouth daily.    ? azelastine (ASTELIN) 0.1 % nasal spray Place 1-2 sprays into both  nostrils 2 (two) times daily. Use in each nostril as directed 30 mL 6  ? carboxymethylcellulose (REFRESH TEARS) 0.5 % SOLN Place 1 drop into both eyes daily as needed (dry eyes).    ? cetirizine (ZYRTEC) 10 MG tablet Take 10 mg by mouth daily. Alternates weekly with allegra.    ? esomeprazole (NEXIUM) 20 MG capsule TAKE ONE CAPSULE BY MOUTH DAILY AT 12:00 PM 90 capsule 3  ? fexofenadine (ALLEGRA) 180 MG tablet Take 180 mg by mouth daily. Alternates weekly with zyrtec.    ? GLUCOSAMINE-CHONDROITIN PO Take 2 tablets by mouth daily.    ? losartan (COZAAR) 100 MG tablet TAKE 1 TABLET(100 MG) BY MOUTH DAILY 90 tablet 3  ? Magnesium 250 MG TABS Take 1 tablet (250 mg total) by mouth daily. (Patient taking differently: Take 2 tablets by mouth daily.)  0  ? metoprolol succinate (TOPROL-XL) 50 MG 24 hr tablet Take 1 tablet (50 mg total) by mouth daily. 90 tablet 1  ? Misc Natural Products (OSTEO BI-FLEX TRIPLE STRENGTH PO) Take by mouth.    ? Multiple Vitamins-Minerals (MULTIVITAMIN ADULT PO) Take 1 tablet by mouth daily.    ? naproxen sodium (ANAPROX) 275 MG tablet Take 1 tablet (275 mg total) by mouth 2 (two) times daily with a meal. (Patient taking differently: Take 275 mg by mouth 2 (  two) times daily with a meal. As needed) 20 tablet 0  ? polyethylene glycol (MIRALAX / GLYCOLAX) packet Take 8.5 g by mouth daily.     ? Potassium 99 MG TABS Take 99 mg by mouth daily.    ? donepezil (ARICEPT) 10 MG tablet Take 1 tablet (10 mg total) by mouth at bedtime. 90 tablet 1  ? ?No facility-administered medications prior to visit.  ?  ? ?Per HPI unless specifically indicated in ROS section below ?Review of Systems ? ?Objective:  ?BP (!) 176/66 (BP Location: Right Arm, Cuff Size: Normal)   Pulse (!) 50   Temp (!) 97.5 ?F (36.4 ?C) (Temporal)   Ht '5\' 8"'$  (1.727 m)   Wt 219 lb 4 oz (99.5 kg)   SpO2 97%   BMI 33.34 kg/m?   ?Wt Readings from Last 3 Encounters:  ?09/16/21 219 lb 4 oz (99.5 kg)  ?08/31/21 218 lb 8 oz (99.1 kg)   ?05/20/21 218 lb 7.6 oz (99.1 kg)  ?  ?  ?Physical Exam ?Vitals and nursing note reviewed.  ?Constitutional:   ?   Appearance: Normal appearance. He is not ill-appearing.  ?Cardiovascular:  ?   Rate and Rhythm: Normal rate and regular rhythm.  ?   Pulses: Normal pulses.  ?   Heart sounds: Normal heart sounds. No murmur heard. ?Pulmonary:  ?   Effort: Pulmonary effort is normal. No respiratory distress.  ?   Breath sounds: Normal breath sounds. No wheezing, rhonchi or rales.  ?Musculoskeletal:  ?   Right lower leg: No edema.  ?   Left lower leg: No edema.  ?Skin: ?   General: Skin is warm and dry.  ?Neurological:  ?   Mental Status: He is alert.  ?Psychiatric:     ?   Mood and Affect: Mood normal.     ?   Behavior: Behavior normal.  ? ?   ?Results for orders placed or performed in visit on 02/16/21  ?Lipid panel  ?Result Value Ref Range  ? Cholesterol 146 0 - 200 mg/dL  ? Triglycerides 149.0 0.0 - 149.0 mg/dL  ? HDL 43.10 >39.00 mg/dL  ? VLDL 29.8 0.0 - 40.0 mg/dL  ? LDL Cholesterol 73 0 - 99 mg/dL  ? Total CHOL/HDL Ratio 3   ? NonHDL 102.45   ?Comprehensive metabolic panel  ?Result Value Ref Range  ? Sodium 140 135 - 145 mEq/L  ? Potassium 5.0 3.5 - 5.1 mEq/L  ? Chloride 103 96 - 112 mEq/L  ? CO2 30 19 - 32 mEq/L  ? Glucose, Bld 96 70 - 99 mg/dL  ? BUN 23 6 - 23 mg/dL  ? Creatinine, Ser 1.13 0.40 - 1.50 mg/dL  ? Total Bilirubin 0.7 0.2 - 1.2 mg/dL  ? Alkaline Phosphatase 55 39 - 117 U/L  ? AST 25 0 - 37 U/L  ? ALT 20 0 - 53 U/L  ? Total Protein 6.9 6.0 - 8.3 g/dL  ? Albumin 4.5 3.5 - 5.2 g/dL  ? GFR 58.59 (L) >60.00 mL/min  ? Calcium 9.5 8.4 - 10.5 mg/dL  ?Uric acid  ?Result Value Ref Range  ? Uric Acid, Serum 3.9 (L) 4.0 - 7.8 mg/dL  ? ? ?Assessment & Plan:  ? ?Problem List Items Addressed This Visit   ? ? HTN (hypertension) - Primary  ?  Persistently markedly high readings at home despite losartan and recent increase in Toprol XL, now with some bradycardia. No signs of end organ damage. I will  add amlodipine to  regimen at '5mg'$  daily. Reviewed correct way to check blood pressures at home. I did ask him to continue BP log to include pulse. ?RTC 2-3 wks f/u visit, rpt EKG and labs at that time. Pt agrees with plan.  ?Worsening HTN may have started after increased donepezil dose to '10mg'$  - now back on '5mg'$  dose.  ?Avoiding thiazide in gout history.  ? ?  ?  ? Relevant Medications  ? amLODipine (NORVASC) 5 MG tablet  ? MCI (mild cognitive impairment)  ?  HTN issues may have started/deteriorated after recent increase in donepezil (Aricept) - he is now back on '5mg'$  dose - will stay on this dose. ? ?  ?  ?  ? ?Meds ordered this encounter  ?Medications  ? amLODipine (NORVASC) 5 MG tablet  ?  Sig: Take 1 tablet (5 mg total) by mouth daily.  ?  Dispense:  90 tablet  ?  Refill:  3  ? donepezil (ARICEPT) 5 MG tablet  ?  Sig: Take 1 tablet (5 mg total) by mouth at bedtime.  ?  Dispense:  30 tablet  ?  Refill:  6  ?  Dropping dose back to '5mg'$  daily due to HTN.  ? ?No orders of the defined types were placed in this encounter. ? ? ? ?Patient Instructions  ?Continue losartan '100mg'$  daily, toprol XL '50mg'$  daily.  ?Add amlodipine '5mg'$  daily.  ?Make sure you're checking blood pressure in a resting state - wait 30 min after eating drinking, exercise, caffeine, anger, pain.  ?Limit sodium/salt in the diet.  ?Drink plenty of water.  ?Return in 2-3 weeks for office visit  ? ?Follow up plan: ?Return in about 3 weeks (around 10/07/2021) for follow up visit. ? ?Ria Bush, MD   ?

## 2021-09-16 NOTE — Telephone Encounter (Signed)
Lvm asking pt to call back.  Need to relay Dr. G's message.  

## 2021-09-16 NOTE — Telephone Encounter (Signed)
Patient walked into office requesting a bp check. He brought his numbers, and his BP cuff ? ?Informed the patient that MD had asked that he bring his numbers with him to his next appointment.  ? ?States he doesn't know what numbers are low or high and he doesn't think his bp cuff is working ? ?MD agreed to add patient on at 12:45pm for his schedule today ? ? ?

## 2021-09-19 NOTE — Telephone Encounter (Signed)
Patient called back reviewed information and had him repeat back to me. He has made change already. Will call if any questions.  ?

## 2021-09-20 DIAGNOSIS — M955 Acquired deformity of pelvis: Secondary | ICD-10-CM | POA: Diagnosis not present

## 2021-09-20 DIAGNOSIS — M9903 Segmental and somatic dysfunction of lumbar region: Secondary | ICD-10-CM | POA: Diagnosis not present

## 2021-09-20 DIAGNOSIS — M5416 Radiculopathy, lumbar region: Secondary | ICD-10-CM | POA: Diagnosis not present

## 2021-09-20 DIAGNOSIS — M9905 Segmental and somatic dysfunction of pelvic region: Secondary | ICD-10-CM | POA: Diagnosis not present

## 2021-09-30 ENCOUNTER — Ambulatory Visit: Payer: Medicare Other | Admitting: Family Medicine

## 2021-10-05 ENCOUNTER — Ambulatory Visit (INDEPENDENT_AMBULATORY_CARE_PROVIDER_SITE_OTHER): Payer: Medicare Other | Admitting: Family Medicine

## 2021-10-05 ENCOUNTER — Telehealth: Payer: Self-pay | Admitting: Family Medicine

## 2021-10-05 ENCOUNTER — Encounter: Payer: Self-pay | Admitting: Family Medicine

## 2021-10-05 VITALS — BP 148/54 | HR 65 | Temp 97.6°F | Ht 68.0 in | Wt 218.5 lb

## 2021-10-05 DIAGNOSIS — R252 Cramp and spasm: Secondary | ICD-10-CM | POA: Diagnosis not present

## 2021-10-05 DIAGNOSIS — R011 Cardiac murmur, unspecified: Secondary | ICD-10-CM

## 2021-10-05 DIAGNOSIS — I10 Essential (primary) hypertension: Secondary | ICD-10-CM

## 2021-10-05 DIAGNOSIS — I440 Atrioventricular block, first degree: Secondary | ICD-10-CM

## 2021-10-05 HISTORY — DX: Atrioventricular block, first degree: I44.0

## 2021-10-05 LAB — CBC WITH DIFFERENTIAL/PLATELET
Basophils Absolute: 0 10*3/uL (ref 0.0–0.1)
Basophils Relative: 1 % (ref 0.0–3.0)
Eosinophils Absolute: 0.1 10*3/uL (ref 0.0–0.7)
Eosinophils Relative: 2.3 % (ref 0.0–5.0)
HCT: 36.7 % — ABNORMAL LOW (ref 39.0–52.0)
Hemoglobin: 12.3 g/dL — ABNORMAL LOW (ref 13.0–17.0)
Lymphocytes Relative: 34.4 % (ref 12.0–46.0)
Lymphs Abs: 1.5 10*3/uL (ref 0.7–4.0)
MCHC: 33.5 g/dL (ref 30.0–36.0)
MCV: 97.3 fl (ref 78.0–100.0)
Monocytes Absolute: 0.5 10*3/uL (ref 0.1–1.0)
Monocytes Relative: 10.6 % (ref 3.0–12.0)
Neutro Abs: 2.3 10*3/uL (ref 1.4–7.7)
Neutrophils Relative %: 51.7 % (ref 43.0–77.0)
Platelets: 205 10*3/uL (ref 150.0–400.0)
RBC: 3.78 Mil/uL — ABNORMAL LOW (ref 4.22–5.81)
RDW: 14.9 % (ref 11.5–15.5)
WBC: 4.5 10*3/uL (ref 4.0–10.5)

## 2021-10-05 LAB — BASIC METABOLIC PANEL
BUN: 23 mg/dL (ref 6–23)
CO2: 30 mEq/L (ref 19–32)
Calcium: 9.2 mg/dL (ref 8.4–10.5)
Chloride: 102 mEq/L (ref 96–112)
Creatinine, Ser: 1.17 mg/dL (ref 0.40–1.50)
GFR: 55.94 mL/min — ABNORMAL LOW (ref >60.00)
Glucose, Bld: 81 mg/dL (ref 70–99)
Potassium: 4.6 mEq/L (ref 3.5–5.1)
Sodium: 138 mEq/L (ref 135–145)

## 2021-10-05 LAB — MAGNESIUM: Magnesium: 2.2 mg/dL (ref 1.5–2.5)

## 2021-10-05 LAB — TSH: TSH: 2.16 u[IU]/mL (ref 0.35–5.50)

## 2021-10-05 MED ORDER — AMLODIPINE BESYLATE 10 MG PO TABS: 10.0000 mg | ORAL_TABLET | Freq: Every day | ORAL | 3 refills | Status: DC

## 2021-10-05 MED ORDER — METOPROLOL SUCCINATE ER 25 MG PO TB24
25.0000 mg | ORAL_TABLET | Freq: Every day | ORAL | 3 refills | Status: DC
Start: 2021-10-05 — End: 2022-01-03

## 2021-10-05 NOTE — Assessment & Plan Note (Signed)
Not appreciated today.  

## 2021-10-05 NOTE — Assessment & Plan Note (Addendum)
With bradycardia 09/2021 - drop Toprol XL to '25mg'$  daily, increase amlodipine to '10mg'$  daily. Donepezil can also cause AV block.

## 2021-10-05 NOTE — Telephone Encounter (Signed)
Lvm asking pt to call back.  Need to relay Dr. G's message.  

## 2021-10-05 NOTE — Assessment & Plan Note (Addendum)
Longstanding since teenager to thighs and calves, predominantly at night but can happen during the day. Manages with tonic water and homeopathic cramping pills.  Check labwork today.

## 2021-10-05 NOTE — Telephone Encounter (Signed)
Please notify - EKG showed marked bradycardia with 1st degree AV block - heart rate was too low, and metoprolol may be contributing to electrical block in an area of the heart.   Recommend drop Toprol XL dose to '25mg'$  daily, new dose sent to pharmacy and will also increase amlodipine to '10mg'$  daily to help with BP control, new dose sent to pharmacy.   Rec rpt EKG at next visit.   Update [place] if develops any chest pain, palpitations, shortness of breath, dizziness.  Watch for ankle swelling on higher amlodipine dose.

## 2021-10-05 NOTE — Progress Notes (Signed)
Patient ID: [name], male    DOB: 31-Mar-1932, 88 y.o.   MRN: [id number]  This visit was conducted in person.  BP (!) 148/54 (BP Location: Left Arm, Cuff Size: Normal)   Pulse 65   Temp 97.6 F (36.4 C) (Temporal)   Ht '5\' 8"'$  (1.727 m)   Wt 218 lb 8 oz (99.1 kg)   SpO2 99%   BMI 33.22 kg/m   BP Readings from Last 3 Encounters:  10/05/21 (!) 148/54  09/16/21 (!) 176/66  08/31/21 140/70   CC: HTN f/u visit  Subjective:   HPI: [name] is a 88 y.o. male presenting on 10/05/2021 for Hypertension (Here for 3 wk f/u. )   See prior note for details.  HTN - Compliant with current antihypertensive regimen of losartan '100mg'$  daily, toprol XL '50mg'$  daily, amlodipine '5mg'$  daily. Tolerating new med (amlodipine) well. Does check blood pressures at home: 123-195/61-83, HR 50-60s.  No low blood pressure readings or symptoms of dizziness/syncope.  Denies HA, vision changes, CP/tightness, SOB, leg swelling.    He's been using [employer]'s homeopathic cramping medicine as well as tonic water regularly for longstanding leg cramps since age 67yo.       Relevant past medical, surgical, family and social history reviewed and updated as indicated. Interim medical history since our last visit reviewed. Allergies and medications reviewed and updated. Outpatient Medications Prior to Visit  Medication Sig Dispense Refill   acetaminophen (TYLENOL) 500 MG tablet Take 1-2 tablets (500-1,000 mg total) by mouth 2 (two) times daily as needed for moderate pain. (Patient taking differently: Take 500-1,000 mg by mouth 2 (two) times daily as needed for moderate pain. Pt states taking 3 tablets)     allopurinol (ZYLOPRIM) 100 MG tablet TAKE 2 TABLETS(200 MG) BY MOUTH DAILY 180 tablet 1   aspirin EC 81 MG tablet Take 81 mg by mouth daily.     azelastine (ASTELIN) 0.1 % nasal spray Place 1-2 sprays into both nostrils 2 (two) times daily. Use in each nostril as directed 30 mL 6   carboxymethylcellulose  (REFRESH TEARS) 0.5 % SOLN Place 1 drop into both eyes daily as needed (dry eyes).     cetirizine (ZYRTEC) 10 MG tablet Take 10 mg by mouth daily. Alternates weekly with allegra.     donepezil (ARICEPT) 5 MG tablet Take 1 tablet (5 mg total) by mouth at bedtime. 30 tablet 6   esomeprazole (NEXIUM) 20 MG capsule TAKE ONE CAPSULE BY MOUTH DAILY AT 12:00 PM 90 capsule 3   fexofenadine (ALLEGRA) 180 MG tablet Take 180 mg by mouth daily. Alternates weekly with zyrtec.     GLUCOSAMINE-CHONDROITIN PO Take 2 tablets by mouth daily.     losartan (COZAAR) 100 MG tablet TAKE 1 TABLET(100 MG) BY MOUTH DAILY 90 tablet 3   Magnesium 250 MG TABS Take 1 tablet (250 mg total) by mouth daily. (Patient taking differently: Take 2 tablets by mouth daily.)  0   Misc Natural Products (OSTEO BI-FLEX TRIPLE STRENGTH PO) Take by mouth.     Multiple Vitamins-Minerals (MULTIVITAMIN ADULT PO) Take 1 tablet by mouth daily.     naproxen sodium (ANAPROX) 275 MG tablet Take 1 tablet (275 mg total) by mouth 2 (two) times daily with a meal. (Patient taking differently: Take 275 mg by mouth 2 (two) times daily with a meal. As needed) 20 tablet 0   polyethylene glycol (MIRALAX / GLYCOLAX) packet Take 8.5 g by mouth daily.  Potassium 99 MG TABS Take 99 mg by mouth daily.     amLODipine (NORVASC) 5 MG tablet Take 1 tablet (5 mg total) by mouth daily. 90 tablet 3   metoprolol succinate (TOPROL-XL) 50 MG 24 hr tablet Take 1 tablet (50 mg total) by mouth daily. 90 tablet 1   No facility-administered medications prior to visit.     Per HPI unless specifically indicated in ROS section below Review of Systems  Objective:  BP (!) 148/54 (BP Location: Left Arm, Cuff Size: Normal)   Pulse 65   Temp 97.6 F (36.4 C) (Temporal)   Ht '5\' 8"'$  (1.727 m)   Wt 218 lb 8 oz (99.1 kg)   SpO2 99%   BMI 33.22 kg/m   Wt Readings from Last 3 Encounters:  10/05/21 218 lb 8 oz (99.1 kg)  09/16/21 219 lb 4 oz (99.5 kg)  08/31/21 218 lb 8 oz  (99.1 kg)      Physical Exam Vitals and nursing note reviewed.  Constitutional:      Appearance: Normal appearance. He is not ill-appearing.  Cardiovascular:     Rate and Rhythm: Normal rate and regular rhythm.     Pulses: Normal pulses.     Heart sounds: Normal heart sounds. No murmur heard.    Comments: Distant heart sounds Pulmonary:     Effort: Pulmonary effort is normal. No respiratory distress.     Breath sounds: Normal breath sounds. No wheezing, rhonchi or rales.  Musculoskeletal:     Right lower leg: No edema.     Left lower leg: No edema.  Skin:    General: Skin is warm and dry.     Findings: No rash.  Neurological:     Mental Status: He is alert.  Psychiatric:        Mood and Affect: Mood normal.        Behavior: Behavior normal.      Results for orders placed or performed in visit on 02/16/21  Lipid panel  Result Value Ref Range   Cholesterol 146 0 - 200 mg/dL   Triglycerides 149.0 0.0 - 149.0 mg/dL   HDL 43.10 >39.00 mg/dL   VLDL 29.8 0.0 - 40.0 mg/dL   LDL Cholesterol 73 0 - 99 mg/dL   Total CHOL/HDL Ratio 3    NonHDL 102.45   Comprehensive metabolic panel  Result Value Ref Range   Sodium 140 135 - 145 mEq/L   Potassium 5.0 3.5 - 5.1 mEq/L   Chloride 103 96 - 112 mEq/L   CO2 30 19 - 32 mEq/L   Glucose, Bld 96 70 - 99 mg/dL   BUN 23 6 - 23 mg/dL   Creatinine, Ser 1.13 0.40 - 1.50 mg/dL   Total Bilirubin 0.7 0.2 - 1.2 mg/dL   Alkaline Phosphatase 55 39 - 117 U/L   AST 25 0 - 37 U/L   ALT 20 0 - 53 U/L   Total Protein 6.9 6.0 - 8.3 g/dL   Albumin 4.5 3.5 - 5.2 g/dL   GFR 58.59 (L) >60.00 mL/min   Calcium 9.5 8.4 - 10.5 mg/dL  Uric acid  Result Value Ref Range   Uric Acid, Serum 3.9 (L) 4.0 - 7.8 mg/dL   EKG - marked bradycardia 45 with 1st degree AV block, normal axis, no hypertrophy or acute ST/T changes.   Assessment & Plan:   Problem List Items Addressed This Visit     HTN (hypertension) - Primary    Chronic, improving. Office readings  largely better than home readings. I did suggest he buy new BP cuff to ensure accurate measurement at home.  Continue current regimen.  Update EKG and labs today.  RTC 3 mo HTN f/u visit. Pt agrees with plan.  ADDENDUM ==> EKG with marked bradycardia with 1st degree AV block - recommend drop Toprol XL dose to '25mg'$  daily, new dose sent to pharmacy, rec rpt EKG at next visit. Will also increase amlodipine to '10mg'$  daily, new dose sent to pharmacy.        Relevant Medications   amLODipine (NORVASC) 10 MG tablet   metoprolol succinate (TOPROL-XL) 25 MG 24 hr tablet   Other Relevant Orders   CBC with Differential/Platelet   Basic metabolic panel   TSH   Leg cramping    Longstanding since teenager to thighs and calves, predominantly at night but can happen during the day. Manages with tonic water and homeopathic cramping pills.  Check labwork today.        Relevant Orders   CBC with Differential/Platelet   Basic metabolic panel   TSH   Magnesium   Systolic murmur    Not appreciated today.        Relevant Orders   EKG 12-Lead (Completed)   AV block, 1st degree    With bradycardia 09/2021 - drop Toprol XL to '25mg'$  daily, increase amlodipine to '10mg'$  daily. Donepezil can also cause AV block.       Relevant Medications   amLODipine (NORVASC) 10 MG tablet   metoprolol succinate (TOPROL-XL) 25 MG 24 hr tablet     Meds ordered this encounter  Medications   amLODipine (NORVASC) 10 MG tablet    Sig: Take 1 tablet (10 mg total) by mouth daily.    Dispense:  90 tablet    Refill:  3    Note new dose   metoprolol succinate (TOPROL-XL) 25 MG 24 hr tablet    Sig: Take 1 tablet (25 mg total) by mouth daily.    Dispense:  90 tablet    Refill:  3    Note new dose   Orders Placed This Encounter  Procedures   CBC with Differential/Platelet   Basic metabolic panel   TSH   Magnesium   EKG 12-Lead     Patient Instructions  Consider new BP cuff (Omicron is a good brand) BP overall  ok today EKG today as well as labs. Continue current medicines, keep follow up visit in August.   Follow up plan: Return in about 3 months (around 01/05/2022) for follow up visit.  Ria Bush, MD

## 2021-10-05 NOTE — Patient Instructions (Addendum)
Consider new BP cuff (Omicron is a good brand) BP overall ok today EKG today as well as labs. Continue current medicines, keep follow up visit in August.

## 2021-10-05 NOTE — Assessment & Plan Note (Addendum)
Chronic, improving. Office readings largely better than home readings. I did suggest he buy new BP cuff to ensure accurate measurement at home.  Continue current regimen.  Update EKG and labs today.  RTC 3 mo HTN f/u visit. Pt agrees with plan.  ADDENDUM ==> EKG with marked bradycardia with 1st degree AV block - recommend drop Toprol XL dose to '25mg'$  daily, new dose sent to pharmacy, rec rpt EKG at next visit. Will also increase amlodipine to '10mg'$  daily, new dose sent to pharmacy.

## 2021-10-06 ENCOUNTER — Encounter: Payer: Self-pay | Admitting: Family Medicine

## 2021-10-06 DIAGNOSIS — D649 Anemia, unspecified: Secondary | ICD-10-CM | POA: Insufficient documentation

## 2021-10-06 NOTE — Telephone Encounter (Signed)
Lvm asking pt to call back.  Need to relay Dr. G's message.   Mailing a letter.  

## 2021-10-07 NOTE — Telephone Encounter (Signed)
Pt rtn call.  I relayed Dr. Synthia Innocent message and informed pt I have mailed this info to him. Pt verbalizes understanding and will pick up new rxs today.

## 2021-10-10 DIAGNOSIS — M955 Acquired deformity of pelvis: Secondary | ICD-10-CM | POA: Diagnosis not present

## 2021-10-10 DIAGNOSIS — M5416 Radiculopathy, lumbar region: Secondary | ICD-10-CM | POA: Diagnosis not present

## 2021-10-10 DIAGNOSIS — M9905 Segmental and somatic dysfunction of pelvic region: Secondary | ICD-10-CM | POA: Diagnosis not present

## 2021-10-10 DIAGNOSIS — M9903 Segmental and somatic dysfunction of lumbar region: Secondary | ICD-10-CM | POA: Diagnosis not present

## 2021-10-31 DIAGNOSIS — M5416 Radiculopathy, lumbar region: Secondary | ICD-10-CM | POA: Diagnosis not present

## 2021-10-31 DIAGNOSIS — M9905 Segmental and somatic dysfunction of pelvic region: Secondary | ICD-10-CM | POA: Diagnosis not present

## 2021-10-31 DIAGNOSIS — M955 Acquired deformity of pelvis: Secondary | ICD-10-CM | POA: Diagnosis not present

## 2021-10-31 DIAGNOSIS — M9903 Segmental and somatic dysfunction of lumbar region: Secondary | ICD-10-CM | POA: Diagnosis not present

## 2021-11-21 DIAGNOSIS — M955 Acquired deformity of pelvis: Secondary | ICD-10-CM | POA: Diagnosis not present

## 2021-11-21 DIAGNOSIS — M9903 Segmental and somatic dysfunction of lumbar region: Secondary | ICD-10-CM | POA: Diagnosis not present

## 2021-11-21 DIAGNOSIS — M9905 Segmental and somatic dysfunction of pelvic region: Secondary | ICD-10-CM | POA: Diagnosis not present

## 2021-11-21 DIAGNOSIS — M5416 Radiculopathy, lumbar region: Secondary | ICD-10-CM | POA: Diagnosis not present

## 2021-12-01 ENCOUNTER — Encounter: Payer: Self-pay | Admitting: Family Medicine

## 2021-12-01 ENCOUNTER — Ambulatory Visit: Payer: Medicare Other | Admitting: Family Medicine

## 2021-12-01 VITALS — BP 150/62 | HR 52 | Ht 69.75 in | Wt 219.0 lb

## 2021-12-01 DIAGNOSIS — Z9989 Dependence on other enabling machines and devices: Secondary | ICD-10-CM | POA: Diagnosis not present

## 2021-12-01 DIAGNOSIS — G4733 Obstructive sleep apnea (adult) (pediatric): Secondary | ICD-10-CM | POA: Diagnosis not present

## 2021-12-01 NOTE — Progress Notes (Signed)
PATIENT: [name] DOB: [birth year]  REASON FOR VISIT: follow up HISTORY FROM: patient  Chief Complaint  Patient presents with   Follow-up    Pt alone rm 2. Presents today for follow up. States no problems. DME [facility]    HISTORY OF PRESENT ILLNESS:  12/01/2021 ALL: [name] returns for follow up for OSA on CPAP. He continues to do well on therapy. He is using CPAP nightly for about 8-9 hours. He reports that if he doesn't use CPAP he wakes himself up snoring. He is eligible for new machine. Set up date 11/2016. He is working at [employer] and hesitant to not use CPAP due to fatigue the next day.     12/01/2020 ALL:  [name] returns for follow up for OSA on CPAP. He continues to do very well on CPAP therapy. He is using CPAP every night for about 8 hours a night. He can not sleep without it. He has returned to work. He is working 5 hours a day 5 days a week at [employer] in [employer]. He is loving his job and enjoys being active.     09/04/2019 ALL:  [name] is a 88 y.o. male here today for follow up for OSA on CPAP.  He is doing very well today and without complaints.  He continues CPAP therapy every night.  He states that he cannot sleep without it.  He is followed closely by PCP.  Blood pressure is 160/65 today.  He reports that at his CPE 3 weeks ago it was normal.  He does not routinely check at home.  He is asymptomatic today.  He does take metoprolol as prescribed.  Compliance report dated 08/04/2019 through 09/02/2019 reveals that he has used CPAP therapy every night.  He has used CPAP greater than 4 hours every night.  100% compliance on close.  Average usage is 8 hours and 33 minutes.  Residual AHI is 2.3 on 10 cm of water and EPR of 1.  There is no significant leak noted.  HISTORY: (copied from [place] note on 08/29/2017)  [name] is an 88 year old male with a history of obstructive sleep apnea on CPAP.  His download indicates  that he use his machine nightly for compliance of 100%.  Every night he uses machine greater than 4 hours.  On average he uses his machine 8 hours and 56 minutes.  His residual AHI is 2.6 on 10 cm of water with EPR of 1.  He does not have a significant leak.  He states that since he is been on 10 cm of water he feels that he wakes up more.  He also states that his wife has noted that he is snoring.  He denies any significant daytime sleepiness.  He returns today for an evaluation.   HISTORY 01/23/2017 (Copied from Dr. Guadelupe Sabin note):  I reviewed his CPAP compliance data from 12/23/2016 through 01/21/2017 which is a total of 30 days, during which time he used his machine 29 days with percent used days greater than 4 hours at 97%, indicating excellent compliance with an average usage of 9 hours and 35 minutes, residual AHI at goal at 2.2 per hour, leak acceptable with the 95th percentile at 16.1 L/m on a pressure of 10 cm with EPR. He reports, he occasional wakes up with a dull headache. He limits his water intake after 8 PM. He does like to drink wine, wife gives him a hard time if he  drinks more than 3 glasses. He is using a medium Eson nasal mask. Has occasional leg cramps, but tonic water helps, no actual RLS symptoms.    The patient's allergies, current medications, family history, past medical history, past social history, past surgical history and problem list were reviewed and updated as appropriate.     REVIEW OF SYSTEMS: Out of a complete 14 system review of symptoms, the patient complains only of the following symptoms, none and all other reviewed systems are negative.  ESS: 6/24, previously 9/24  ALLERGIES: Allergies  Allergen Reactions   Lisinopril Cough    HOME MEDICATIONS: Outpatient Medications Prior to Visit  Medication Sig Dispense Refill   acetaminophen (TYLENOL) 500 MG tablet Take 1-2 tablets (500-1,000 mg total) by mouth 2 (two) times daily as needed for moderate pain. (Patient  taking differently: Take 500-1,000 mg by mouth 2 (two) times daily as needed for moderate pain. Pt states taking 3 tablets)     allopurinol (ZYLOPRIM) 100 MG tablet TAKE 2 TABLETS(200 MG) BY MOUTH DAILY 180 tablet 1   amLODipine (NORVASC) 10 MG tablet Take 1 tablet (10 mg total) by mouth daily. 90 tablet 3   aspirin EC 81 MG tablet Take 81 mg by mouth daily.     azelastine (ASTELIN) 0.1 % nasal spray Place 1-2 sprays into both nostrils 2 (two) times daily. Use in each nostril as directed 30 mL 6   carboxymethylcellulose (REFRESH TEARS) 0.5 % SOLN Place 1 drop into both eyes daily as needed (dry eyes).     cetirizine (ZYRTEC) 10 MG tablet Take 10 mg by mouth daily. Alternates weekly with allegra.     donepezil (ARICEPT) 5 MG tablet Take 1 tablet (5 mg total) by mouth at bedtime. 30 tablet 6   esomeprazole (NEXIUM) 20 MG capsule TAKE ONE CAPSULE BY MOUTH DAILY AT 12:00 PM 90 capsule 3   fexofenadine (ALLEGRA) 180 MG tablet Take 180 mg by mouth daily. Alternates weekly with zyrtec.     GLUCOSAMINE-CHONDROITIN PO Take 2 tablets by mouth daily.     losartan (COZAAR) 100 MG tablet TAKE 1 TABLET(100 MG) BY MOUTH DAILY 90 tablet 3   Magnesium 250 MG TABS Take 1 tablet (250 mg total) by mouth daily. (Patient taking differently: Take 2 tablets by mouth daily.)  0   metoprolol succinate (TOPROL-XL) 25 MG 24 hr tablet Take 1 tablet (25 mg total) by mouth daily. 90 tablet 3   Misc Natural Products (OSTEO BI-FLEX TRIPLE STRENGTH PO) Take by mouth.     Multiple Vitamins-Minerals (MULTIVITAMIN ADULT PO) Take 1 tablet by mouth daily.     naproxen sodium (ANAPROX) 275 MG tablet Take 1 tablet (275 mg total) by mouth 2 (two) times daily with a meal. (Patient taking differently: Take 275 mg by mouth 2 (two) times daily with a meal. As needed) 20 tablet 0   polyethylene glycol (MIRALAX / GLYCOLAX) packet Take 8.5 g by mouth daily.      Potassium 99 MG TABS Take 99 mg by mouth daily.     No facility-administered  medications prior to visit.    PAST MEDICAL HISTORY: Past Medical History:  Diagnosis Date   Actinic keratosis    Carotid stenosis, asymptomatic, bilateral 09/16/2016   Diffuse 1-39% bilaterally, but involving ICA, ECA, CCA - rpt 1 yr (11/8293)   Complication of anesthesia    slow to wake up after a colonoscopy years ago   Diverticulitis    GERD (gastroesophageal reflux disease)    Gout  History of osteomyelitis 10/2015   left foot   History of squamous cell carcinoma of skin    HTN (hypertension)    OSA on CPAP 09/01/2016   Osteoarthritis    back, neck, knees - multiple joints   Personal history of malignant neoplasm of larynx 2009   s/p XRT, Skin Cancer- "pre cancerous"   Pre-diabetes    Seasonal allergies    Sensorineural hearing loss (SNHL) of both ears 09/01/2016   Squamous cell carcinoma of skin unknown   Treated in [place] of the L post auricular     PAST SURGICAL HISTORY: Past Surgical History:  Procedure Laterality Date   ACHILLES TENDON REPAIR Left    AMPUTATION Left 05/16/2019   LEFT GREAT TOE AMPUTATION for osteomyelitis Sharol Given, Illene Regulus, MD), 2nd toe removed   APPENDECTOMY     COLONOSCOPY     THROAT SURGERY N/A 2009   ?vocal cord vs larynx nodule removal, had XRT after this but states it was not cancer   TONSILLECTOMY     TREATMENT FISTULA ANAL      FAMILY HISTORY: Family History  Problem Relation Age of Onset   Hypertension Mother    Hypertension Father    Cancer Other        unknown - niece   CAD Sister 25       stent (80% blockage)   CAD Brother 21       stent (100% blockage)    SOCIAL HISTORY: Social History   Socioeconomic History   Marital status: Married    Spouse name: Not on file   Number of children: Not on file   Years of education: Not on file   Highest education level: Bachelor's degree (e.g., BA, AB, BS)  Occupational History   Not on file  Tobacco Use   Smoking status: Never   Smokeless tobacco: Never  Vaping Use    Vaping Use: Never used  Substance and Sexual Activity   Alcohol use: Yes    Comment: occasional wine   Drug use: No   Sexual activity: Not on file  Other Topics Concern   Not on file  Social History Narrative   "[name]"   Widower. 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   Edu: college chemistry degree   Right handed   Caffeine: Drinks 1 cup of coffee every morning    Social Determinants of Radio broadcast assistant Strain: Low Risk  (02/11/2020)   Overall Financial Resource Strain (CARDIA)    Difficulty of Paying Living Expenses: Not hard at all  Food Insecurity: No Food Insecurity (02/11/2020)   Hunger Vital Sign    Worried About Running Out of Food in the Last Year: Never true    Ran Out of Food in the Last Year: Never true  Transportation Needs: No Transportation Needs (02/11/2020)   PRAPARE - Hydrologist (Medical): No    Lack of Transportation (Non-Medical): No  Physical Activity: Sufficiently Active (02/11/2020)   Exercise Vital Sign    Days of Exercise per Week: 7 days    Minutes of Exercise per Session: 30 min  Stress: No Stress Concern Present (02/11/2020)   [place]    Feeling of Stress : Not at all  Social Connections: Not on file  Intimate Partner Violence: Not At Risk (02/11/2020)   Humiliation, Afraid, Rape, and Kick questionnaire  Fear of Current or Ex-Partner: No    Emotionally Abused: No    Physically Abused: No    Sexually Abused: No      PHYSICAL EXAM  Vitals:   12/01/21 1516  BP: (!) 150/62  Pulse: (!) 52  Weight: 219 lb (99.3 kg)  Height: 5' 9.75" (1.772 m)     Body mass index is 31.65 kg/m.  Generalized: Well developed, in no acute distress  Cardiology: normal rate and rhythm, no murmur noted Respiratory: clear to auscultation bilaterally  Neurological examination  Mentation: Alert  oriented to time, place, history taking. Follows all commands speech and language fluent Cranial nerve II-XII: Pupils were equal round reactive to light. Extraocular movements were full, visual field were full  Motor: The motor testing reveals 5 over 5 strength of all 4 extremities. Good symmetric motor tone is noted throughout.  Gait and station: Gait is normal.    DIAGNOSTIC DATA (LABS, IMAGING, TESTING) - I reviewed patient records, labs, notes, testing and imaging myself where available.     02/11/2020   12:12 PM 08/28/2017   12:02 PM 08/23/2016   11:10 AM  MMSE - Mini Mental State Exam  Orientation to time '5 5 5  '$ Orientation to Place '5 5 5  '$ Registration '3 3 3  '$ Attention/ Calculation 5 0 0  Recall '1 2 3  '$ Recall-comments  unable to recall 1 of 3 words   Language- name 2 objects  0 0  Language- repeat '1 1 1  '$ Language- follow 3 step command  3 3  Language- read & follow direction  0 0  Write a sentence  0 0  Copy design  0 0  Total score  19 20     Lab Results  Component Value Date   WBC 4.5 10/05/2021   HGB 12.3 (L) 10/05/2021   HCT 36.7 (L) 10/05/2021   MCV 97.3 10/05/2021   PLT 205.0 10/05/2021      Component Value Date/Time   NA 138 10/05/2021 0927   K 4.6 10/05/2021 0927   CL 102 10/05/2021 0927   CO2 30 10/05/2021 0927   GLUCOSE 81 10/05/2021 0927   BUN 23 10/05/2021 0927   CREATININE 1.17 10/05/2021 0927   CALCIUM 9.2 10/05/2021 0927   PROT 6.9 02/16/2021 1537   ALBUMIN 4.5 02/16/2021 1537   AST 25 02/16/2021 1537   ALT 20 02/16/2021 1537   ALKPHOS 55 02/16/2021 1537   BILITOT 0.7 02/16/2021 1537   GFRNONAA >60 05/16/2019 0703   GFRAA >60 05/16/2019 0703   Lab Results  Component Value Date   CHOL 146 02/16/2021   HDL 43.10 02/16/2021   LDLCALC 73 02/16/2021   LDLDIRECT 76.0 02/05/2019   TRIG 149.0 02/16/2021   CHOLHDL 3 02/16/2021   Lab Results  Component Value Date   HGBA1C 5.6 02/12/2019   Lab Results  Component Value Date   VITAMINB12 491  03/01/2020   Lab Results  Component Value Date   TSH 2.16 10/05/2021       ASSESSMENT AND PLAN 88 y.o. year old male  has a past medical history of Actinic keratosis, Carotid stenosis, asymptomatic, bilateral (09/15/2583), Complication of anesthesia, Diverticulitis, GERD (gastroesophageal reflux disease), Gout, History of osteomyelitis (10/2015), History of squamous cell carcinoma of skin, HTN (hypertension), OSA on CPAP (09/01/2016), Osteoarthritis, Personal history of malignant neoplasm of larynx (2009), Pre-diabetes, Seasonal allergies, Sensorineural hearing loss (SNHL) of both ears (09/01/2016), and Squamous cell carcinoma of skin (unknown). here with  ICD-10-CM   1. OSA on CPAP  G47.33 For home use only DME continuous positive airway pressure (CPAP)   Z99.89        [name] reports doing very well. He is using CPAP nightly. Compliance report reveals excellent compliance.  He was encouraged to continue using CPAP nightly and for greater than 4 hours each night. We will order replacement CPAP. HST on file in 2018. He declines offer to repeat HST. He is followed closely by PCP. He was advised to continue close follow up with PCP and keep an eye on BP.  Adequate hydration, well-balanced diet and regular exercise advised.  We will send updated orders for supplies to [facility].  He will follow-up with me in 1 year, sooner if needed.  He verbalizes understanding and agreement with this plan.   Orders Placed This Encounter  Procedures   For home use only DME continuous positive airway pressure (CPAP)    Ney CPAP machine with set pressure of 10cmH20, EPR 1    Order Specific Question:   Length of Need    Answer:   Lifetime    Order Specific Question:   Patient has OSA or probable OSA    Answer:   Yes    Order Specific Question:   Is the patient currently using CPAP in the home    Answer:   Yes    Order Specific Question:   Settings    Answer:   Other see comments    Order Specific Question:    CPAP supplies needed    Answer:   Mask, headgear, cushions, filters, heated tubing and water chamber      No orders of the defined types were placed in this encounter.     Paul Presto, FNP-C 12/01/2021, 3:54 PM [facility] [address], [place], [place] [address] [phone]

## 2021-12-01 NOTE — Progress Notes (Signed)
New order faxed to DME: [facility]  F: [phone]  ? ?Fax confirmation received  ?

## 2021-12-01 NOTE — Patient Instructions (Addendum)
Please continue using your CPAP regularly. While your insurance requires that you use CPAP at least 4 hours each night on 70% of the nights, I recommend, that you not skip any nights and use it throughout the night if you can. Getting used to CPAP and staying with the treatment long term does take time and patience and discipline. Untreated obstructive sleep apnea when it is moderate to severe can have an adverse impact on cardiovascular health and raise her risk for heart disease, arrhythmias, hypertension, congestive heart failure, stroke and diabetes. Untreated obstructive sleep apnea causes sleep disruption, nonrestorative sleep, and sleep deprivation. This can have an impact on your day to day functioning and cause daytime sleepiness and impairment of cognitive function, memory loss, mood disturbance, and problems focussing. Using CPAP regularly can improve these symptoms.   We will order you a new CPAP. Once you get your new machine, call me. We will set you up with an appt within 31-90 days following set up of new machine.

## 2021-12-14 DIAGNOSIS — M955 Acquired deformity of pelvis: Secondary | ICD-10-CM | POA: Diagnosis not present

## 2021-12-14 DIAGNOSIS — M5416 Radiculopathy, lumbar region: Secondary | ICD-10-CM | POA: Diagnosis not present

## 2021-12-14 DIAGNOSIS — M9903 Segmental and somatic dysfunction of lumbar region: Secondary | ICD-10-CM | POA: Diagnosis not present

## 2021-12-14 DIAGNOSIS — M9905 Segmental and somatic dysfunction of pelvic region: Secondary | ICD-10-CM | POA: Diagnosis not present

## 2021-12-21 ENCOUNTER — Ambulatory Visit: Payer: Medicare Other | Admitting: Dermatology

## 2021-12-24 ENCOUNTER — Other Ambulatory Visit: Payer: Self-pay | Admitting: Family Medicine

## 2021-12-24 DIAGNOSIS — M1A00X0 Idiopathic chronic gout, unspecified site, without tophus (tophi): Secondary | ICD-10-CM

## 2021-12-24 DIAGNOSIS — D649 Anemia, unspecified: Secondary | ICD-10-CM

## 2021-12-24 DIAGNOSIS — E785 Hyperlipidemia, unspecified: Secondary | ICD-10-CM

## 2021-12-26 ENCOUNTER — Other Ambulatory Visit: Payer: Medicare Other

## 2021-12-26 ENCOUNTER — Other Ambulatory Visit (INDEPENDENT_AMBULATORY_CARE_PROVIDER_SITE_OTHER): Payer: Medicare Other

## 2021-12-26 DIAGNOSIS — D649 Anemia, unspecified: Secondary | ICD-10-CM

## 2021-12-26 DIAGNOSIS — M1A00X0 Idiopathic chronic gout, unspecified site, without tophus (tophi): Secondary | ICD-10-CM

## 2021-12-26 DIAGNOSIS — E785 Hyperlipidemia, unspecified: Secondary | ICD-10-CM

## 2021-12-26 LAB — CBC WITH DIFFERENTIAL/PLATELET
Basophils Absolute: 0 10*3/uL (ref 0.0–0.1)
Basophils Relative: 0.8 % (ref 0.0–3.0)
Eosinophils Absolute: 0.1 10*3/uL (ref 0.0–0.7)
Eosinophils Relative: 2 % (ref 0.0–5.0)
HCT: 35.3 % — ABNORMAL LOW (ref 39.0–52.0)
Hemoglobin: 11.8 g/dL — ABNORMAL LOW (ref 13.0–17.0)
Lymphocytes Relative: 32.4 % (ref 12.0–46.0)
Lymphs Abs: 1.7 10*3/uL (ref 0.7–4.0)
MCHC: 33.5 g/dL (ref 30.0–36.0)
MCV: 96.7 fl (ref 78.0–100.0)
Monocytes Absolute: 0.4 10*3/uL (ref 0.1–1.0)
Monocytes Relative: 8.2 % (ref 3.0–12.0)
Neutro Abs: 3 10*3/uL (ref 1.4–7.7)
Neutrophils Relative %: 56.6 % (ref 43.0–77.0)
Platelets: 205 10*3/uL (ref 150.0–400.0)
RBC: 3.65 Mil/uL — ABNORMAL LOW (ref 4.22–5.81)
RDW: 14.6 % (ref 11.5–15.5)
WBC: 5.2 10*3/uL (ref 4.0–10.5)

## 2021-12-26 LAB — LIPID PANEL
Cholesterol: 130 mg/dL (ref 0–200)
HDL: 37.7 mg/dL — ABNORMAL LOW (ref >39.00)
LDL Cholesterol: 54 mg/dL (ref 0–99)
NonHDL: 92.7
Total CHOL/HDL Ratio: 3
Triglycerides: 195 mg/dL — ABNORMAL HIGH (ref 0.0–149.0)
VLDL: 39 mg/dL (ref 0.0–40.0)

## 2021-12-26 LAB — COMPREHENSIVE METABOLIC PANEL
ALT: 18 U/L (ref 0–53)
AST: 22 U/L (ref 0–37)
Albumin: 3.9 g/dL (ref 3.5–5.2)
Alkaline Phosphatase: 48 U/L (ref 39–117)
BUN: 20 mg/dL (ref 6–23)
CO2: 26 mEq/L (ref 19–32)
Calcium: 8.7 mg/dL (ref 8.4–10.5)
Chloride: 104 mEq/L (ref 96–112)
Creatinine, Ser: 1.19 mg/dL (ref 0.40–1.50)
GFR: 54.73 mL/min — ABNORMAL LOW (ref >60.00)
Glucose, Bld: 89 mg/dL (ref 70–99)
Potassium: 4.3 mEq/L (ref 3.5–5.1)
Sodium: 139 mEq/L (ref 135–145)
Total Bilirubin: 0.4 mg/dL (ref 0.2–1.2)
Total Protein: 6.5 g/dL (ref 6.0–8.3)

## 2021-12-26 LAB — URIC ACID: Uric Acid, Serum: 4.5 mg/dL (ref 4.0–7.8)

## 2021-12-26 LAB — FOLATE: Folate: 21 ng/mL (ref >5.9)

## 2021-12-26 LAB — VITAMIN B12: Vitamin B-12: 381 pg/mL (ref 211–911)

## 2022-01-03 ENCOUNTER — Encounter: Payer: Self-pay | Admitting: Family Medicine

## 2022-01-03 ENCOUNTER — Ambulatory Visit (INDEPENDENT_AMBULATORY_CARE_PROVIDER_SITE_OTHER): Payer: Medicare Other | Admitting: Family Medicine

## 2022-01-03 VITALS — BP 172/70 | HR 55 | Temp 97.5°F | Ht 67.75 in | Wt 221.0 lb

## 2022-01-03 DIAGNOSIS — I10 Essential (primary) hypertension: Secondary | ICD-10-CM

## 2022-01-03 DIAGNOSIS — I6523 Occlusion and stenosis of bilateral carotid arteries: Secondary | ICD-10-CM

## 2022-01-03 DIAGNOSIS — D649 Anemia, unspecified: Secondary | ICD-10-CM

## 2022-01-03 DIAGNOSIS — G3184 Mild cognitive impairment, so stated: Secondary | ICD-10-CM

## 2022-01-03 DIAGNOSIS — I440 Atrioventricular block, first degree: Secondary | ICD-10-CM

## 2022-01-03 DIAGNOSIS — G4733 Obstructive sleep apnea (adult) (pediatric): Secondary | ICD-10-CM

## 2022-01-03 DIAGNOSIS — R252 Cramp and spasm: Secondary | ICD-10-CM | POA: Diagnosis not present

## 2022-01-03 DIAGNOSIS — M955 Acquired deformity of pelvis: Secondary | ICD-10-CM | POA: Diagnosis not present

## 2022-01-03 DIAGNOSIS — M9903 Segmental and somatic dysfunction of lumbar region: Secondary | ICD-10-CM | POA: Diagnosis not present

## 2022-01-03 DIAGNOSIS — M5416 Radiculopathy, lumbar region: Secondary | ICD-10-CM | POA: Diagnosis not present

## 2022-01-03 DIAGNOSIS — M9905 Segmental and somatic dysfunction of pelvic region: Secondary | ICD-10-CM | POA: Diagnosis not present

## 2022-01-03 DIAGNOSIS — Z9989 Dependence on other enabling machines and devices: Secondary | ICD-10-CM

## 2022-01-03 MED ORDER — HYDRALAZINE HCL 25 MG PO TABS: 25.0000 mg | ORAL_TABLET | Freq: Three times a day (TID) | ORAL | 3 refills | Status: DC

## 2022-01-03 NOTE — Patient Instructions (Addendum)
Stop metoprolol (Toprol XL) '25mg'$ .  Continue amlodipine '10mg'$  daily and losartan '100mg'$  daily.  Start hydralazine '25mg'$  three times daily.  We will refer you for carotid ultrasounds, may refer you to heart doctor for further evaluation of slow heart rate.  Return in 2 months for physical/wellness visit.

## 2022-01-03 NOTE — Progress Notes (Signed)
Patient ID: [name], male    DOB: Sep 03, 1931, 88 y.o.   MRN: [id number]  This visit was conducted in person.  BP (!) 172/70 (BP Location: Left Arm, Cuff Size: Large)   Pulse (!) 55   Temp (!) 97.5 F (36.4 C) (Temporal)   Ht 5' 7.75" (1.721 m)   Wt 221 lb (100.2 kg)   SpO2 98%   BMI 33.85 kg/m   BP Readings from Last 3 Encounters:  01/03/22 (!) 172/70  12/01/21 (!) 150/62  10/05/21 (!) 148/54   CC: HTN f/u visit  Subjective:   HPI: [name] is a 88 y.o. male presenting on 01/03/2022 for Hypertension (Here for f/u. Wants to discuss taking Celebrex vs Tylenol. )   Latest EKG - marked bradycardia with 1st degree AV block - metoproToprol XL dose dropped to '25mg'$  daily, amlodipine increased to '10mg'$  daily. He notes ankle swelling with higher amlodipine dose, L>R.   HTN - Compliant with current antihypertensive regimen of amlodipine '10mg'$  daily, losartan '100mg'$  daily, Toprol XL '25mg'$  daily.  Does check blood pressures at home, brings log: L arm 150-170/70s, R arm 130-150/60-70, R arm consistently 20 points lower. Denies low blood pressure readings or symptoms of dizziness/syncope.  Denies HA, vision changes, CP/tightness, SOB, leg swelling.    OSA - followed by neurology last seen last moth, doing well on CPAP @ 10 cmH2O.   He's cut tea out as well as salt out of diet.     Relevant past medical, surgical, family and social history reviewed and updated as indicated. Interim medical history since our last visit reviewed. Allergies and medications reviewed and updated. Outpatient Medications Prior to Visit  Medication Sig Dispense Refill   acetaminophen (TYLENOL) 500 MG tablet Take 1-2 tablets (500-1,000 mg total) by mouth 2 (two) times daily as needed for moderate pain. (Patient taking differently: Take 500-1,000 mg by mouth 2 (two) times daily as needed for moderate pain. Pt states taking 3 tablets)     allopurinol (ZYLOPRIM) 100 MG tablet TAKE 2 TABLETS(200 MG) BY MOUTH  DAILY 180 tablet 1   amLODipine (NORVASC) 10 MG tablet Take 1 tablet (10 mg total) by mouth daily. 90 tablet 3   aspirin EC 81 MG tablet Take 81 mg by mouth daily.     azelastine (ASTELIN) 0.1 % nasal spray Place 1-2 sprays into both nostrils 2 (two) times daily. Use in each nostril as directed 30 mL 6   carboxymethylcellulose (REFRESH TEARS) 0.5 % SOLN Place 1 drop into both eyes daily as needed (dry eyes).     cetirizine (ZYRTEC) 10 MG tablet Take 10 mg by mouth daily. Alternates weekly with allegra.     donepezil (ARICEPT) 5 MG tablet Take 1 tablet (5 mg total) by mouth at bedtime. 30 tablet 6   esomeprazole (NEXIUM) 20 MG capsule TAKE ONE CAPSULE BY MOUTH DAILY AT 12:00 PM 90 capsule 3   fexofenadine (ALLEGRA) 180 MG tablet Take 180 mg by mouth daily. Alternates weekly with zyrtec.     GLUCOSAMINE-CHONDROITIN PO Take 2 tablets by mouth daily.     losartan (COZAAR) 100 MG tablet TAKE 1 TABLET(100 MG) BY MOUTH DAILY 90 tablet 3   Magnesium 250 MG TABS Take 1 tablet (250 mg total) by mouth daily. (Patient taking differently: Take 2 tablets by mouth daily.)  0   Misc Natural Products (OSTEO BI-FLEX TRIPLE STRENGTH PO) Take by mouth.     Multiple Vitamins-Minerals (MULTIVITAMIN ADULT PO) Take 1 tablet  by mouth daily.     naproxen sodium (ANAPROX) 275 MG tablet Take 1 tablet (275 mg total) by mouth 2 (two) times daily with a meal. (Patient taking differently: Take 275 mg by mouth 2 (two) times daily with a meal. As needed) 20 tablet 0   polyethylene glycol (MIRALAX / GLYCOLAX) packet Take 8.5 g by mouth daily.      Potassium 99 MG TABS Take 99 mg by mouth daily.     metoprolol succinate (TOPROL-XL) 25 MG 24 hr tablet Take 1 tablet (25 mg total) by mouth daily. 90 tablet 3   No facility-administered medications prior to visit.     Per HPI unless specifically indicated in ROS section below Review of Systems  Objective:  BP (!) 172/70 (BP Location: Left Arm, Cuff Size: Large)   Pulse (!) 55    Temp (!) 97.5 F (36.4 C) (Temporal)   Ht 5' 7.75" (1.721 m)   Wt 221 lb (100.2 kg)   SpO2 98%   BMI 33.85 kg/m   Wt Readings from Last 3 Encounters:  01/03/22 221 lb (100.2 kg)  12/01/21 219 lb (99.3 kg)  10/05/21 218 lb 8 oz (99.1 kg)      Physical Exam Vitals and nursing note reviewed.  Constitutional:      Appearance: Normal appearance. He is obese. He is not ill-appearing.  Eyes:     Extraocular Movements: Extraocular movements intact.     Pupils: Pupils are equal, round, and reactive to light.  Neck:     Thyroid: No thyroid mass or thyromegaly.     Vascular: Carotid bruit (R>L) present.  Cardiovascular:     Rate and Rhythm: Normal rate and regular rhythm.     Pulses: Normal pulses.     Heart sounds: Normal heart sounds. No murmur heard. Pulmonary:     Effort: Pulmonary effort is normal. No respiratory distress.     Breath sounds: Normal breath sounds. No wheezing, rhonchi or rales.  Musculoskeletal:     Cervical back: Normal range of motion and neck supple. No rigidity.     Right lower leg: No edema.     Left lower leg: No edema.     Comments: Distal pedal pulses intact  Lymphadenopathy:     Cervical: No cervical adenopathy.  Skin:    General: Skin is warm and dry.     Findings: No rash.  Neurological:     Mental Status: He is alert.  Psychiatric:        Mood and Affect: Mood normal.        Behavior: Behavior normal.       Results for orders placed or performed in visit on 12/26/21  Folate  Result Value Ref Range   Folate 21.0 >5.9 ng/mL  Vitamin B12  Result Value Ref Range   Vitamin B-12 381 211 - 911 pg/mL  Uric acid  Result Value Ref Range   Uric Acid, Serum 4.5 4.0 - 7.8 mg/dL  CBC with Differential/Platelet  Result Value Ref Range   WBC 5.2 4.0 - 10.5 K/uL   RBC 3.65 (L) 4.22 - 5.81 Mil/uL   Hemoglobin 11.8 (L) 13.0 - 17.0 g/dL   HCT 35.3 (L) 39.0 - 52.0 %   MCV 96.7 78.0 - 100.0 fl   MCHC 33.5 30.0 - 36.0 g/dL   RDW 14.6 11.5 - 15.5 %    Platelets 205.0 150.0 - 400.0 K/uL   Neutrophils Relative % 56.6 43.0 - 77.0 %   Lymphocytes Relative  32.4 12.0 - 46.0 %   Monocytes Relative 8.2 3.0 - 12.0 %   Eosinophils Relative 2.0 0.0 - 5.0 %   Basophils Relative 0.8 0.0 - 3.0 %   Neutro Abs 3.0 1.4 - 7.7 K/uL   Lymphs Abs 1.7 0.7 - 4.0 K/uL   Monocytes Absolute 0.4 0.1 - 1.0 K/uL   Eosinophils Absolute 0.1 0.0 - 0.7 K/uL   Basophils Absolute 0.0 0.0 - 0.1 K/uL  Comprehensive metabolic panel  Result Value Ref Range   Sodium 139 135 - 145 mEq/L   Potassium 4.3 3.5 - 5.1 mEq/L   Chloride 104 96 - 112 mEq/L   CO2 26 19 - 32 mEq/L   Glucose, Bld 89 70 - 99 mg/dL   BUN 20 6 - 23 mg/dL   Creatinine, Ser 1.19 0.40 - 1.50 mg/dL   Total Bilirubin 0.4 0.2 - 1.2 mg/dL   Alkaline Phosphatase 48 39 - 117 U/L   AST 22 0 - 37 U/L   ALT 18 0 - 53 U/L   Total Protein 6.5 6.0 - 8.3 g/dL   Albumin 3.9 3.5 - 5.2 g/dL   GFR 54.73 (L) >60.00 mL/min   Calcium 8.7 8.4 - 10.5 mg/dL  Lipid panel  Result Value Ref Range   Cholesterol 130 0 - 200 mg/dL   Triglycerides 195.0 (H) 0.0 - 149.0 mg/dL   HDL 37.70 (L) >39.00 mg/dL   VLDL 39.0 0.0 - 40.0 mg/dL   LDL Cholesterol 54 0 - 99 mg/dL   Total CHOL/HDL Ratio 3    NonHDL 92.70    EKG - sinus bradycardia 40s with 1st degree AV block, normal axis, no hypertrophy or acute ST/T changes  Assessment & Plan:   Problem List Items Addressed This Visit     HTN (hypertension) - Primary    BP markedly elevated today. EKG with persistent sinus bradycardia with 1st degree AV block.  Stop Toprol XL, continue amlodipine '10mg'$  and losartan '10mg'$ , add hydralazine '25mg'$  TID.  With noted 20 point pressure difference between left>right arms, check carotid US and refer to cardiology.  RTC 2 mo CPE.       Relevant Medications   hydrALAZINE (APRESOLINE) 25 MG tablet   Other Relevant Orders   EKG 12-Lead (Completed)   OSA on CPAP    Continues regular CPAP use, OSA is followed by neurology.       Carotid  stenosis, asymptomatic, bilateral    Update carotid US in h/o mild stenosis, disturbed R subclavian flow.  Anticipate asymptomatic subclavian steal due to atherosclerosis.       Relevant Medications   hydrALAZINE (APRESOLINE) 25 MG tablet   Other Relevant Orders   VAS US CAROTID   Leg cramping    Chronic, ongoing, manages with OTC magnesium and potassium. Also takes tonic water and homeopathic cramping pills.       MCI (mild cognitive impairment)    Aricept started 05/2020. Remains on lower dose '5mg'$  daily.       AV block, 1st degree    Stop BB. Consider cardiology referral pending carotid US.       Relevant Medications   hydrALAZINE (APRESOLINE) 25 MG tablet   Anemia    New, ?CKD related        Meds ordered this encounter  Medications   hydrALAZINE (APRESOLINE) 25 MG tablet    Sig: Take 1 tablet (25 mg total) by mouth 3 (three) times daily.    Dispense:  90 tablet  Refill:  3   Orders Placed This Encounter  Procedures   EKG 12-Lead     Patient Instructions  Stop metoprolol (Toprol XL) '25mg'$ .  Continue amlodipine '10mg'$  daily and losartan '100mg'$  daily.  Start hydralazine '25mg'$  three times daily.  We will refer you for carotid ultrasounds, may refer you to heart doctor for further evaluation of slow heart rate.  Return in 2 months for physical/wellness visit.   Follow up plan: Return in about 2 months (around 03/05/2022) for follow up visit.  Ria Bush, MD

## 2022-01-03 NOTE — Assessment & Plan Note (Signed)
Aricept started 05/2020. Remains on lower dose '5mg'$  daily.

## 2022-01-03 NOTE — Assessment & Plan Note (Addendum)
Chronic, ongoing, manages with OTC magnesium and potassium. Also takes tonic water and homeopathic cramping pills.

## 2022-01-03 NOTE — Assessment & Plan Note (Signed)
BP markedly elevated today. EKG with persistent sinus bradycardia with 1st degree AV block.  Stop Toprol XL, continue amlodipine '10mg'$  and losartan '10mg'$ , add hydralazine '25mg'$  TID.  With noted 20 point pressure difference between left>right arms, check carotid US and refer to cardiology.  RTC 2 mo CPE.

## 2022-01-03 NOTE — Assessment & Plan Note (Signed)
New, ?CKD related

## 2022-01-03 NOTE — Assessment & Plan Note (Addendum)
Stop BB. Consider cardiology referral pending carotid US.

## 2022-01-03 NOTE — Assessment & Plan Note (Addendum)
Update carotid US in h/o mild stenosis, disturbed R subclavian flow.  Anticipate asymptomatic subclavian steal due to atherosclerosis.

## 2022-01-03 NOTE — Assessment & Plan Note (Signed)
Continues regular CPAP use, OSA is followed by neurology.

## 2022-01-08 ENCOUNTER — Observation Stay
Admission: EM | Admit: 2022-01-08 | Discharge: 2022-01-10 | Disposition: A | Discharge status: Home / Self Care | Payer: Medicare Other | Attending: Internal Medicine | Admitting: Internal Medicine

## 2022-01-08 ENCOUNTER — Other Ambulatory Visit: Payer: Self-pay

## 2022-01-08 ENCOUNTER — Emergency Department: Payer: Medicare Other

## 2022-01-08 ENCOUNTER — Encounter: Payer: Self-pay | Admitting: Emergency Medicine

## 2022-01-08 DIAGNOSIS — R2681 Unsteadiness on feet: Secondary | ICD-10-CM | POA: Diagnosis not present

## 2022-01-08 DIAGNOSIS — K219 Gastro-esophageal reflux disease without esophagitis: Secondary | ICD-10-CM | POA: Diagnosis present

## 2022-01-08 DIAGNOSIS — G319 Degenerative disease of nervous system, unspecified: Secondary | ICD-10-CM | POA: Diagnosis not present

## 2022-01-08 DIAGNOSIS — G4733 Obstructive sleep apnea (adult) (pediatric): Secondary | ICD-10-CM

## 2022-01-08 DIAGNOSIS — Z79899 Other long term (current) drug therapy: Secondary | ICD-10-CM | POA: Diagnosis not present

## 2022-01-08 DIAGNOSIS — I10 Essential (primary) hypertension: Secondary | ICD-10-CM

## 2022-01-08 DIAGNOSIS — Z20822 Contact with and (suspected) exposure to covid-19: Secondary | ICD-10-CM | POA: Insufficient documentation

## 2022-01-08 DIAGNOSIS — Z8521 Personal history of malignant neoplasm of larynx: Secondary | ICD-10-CM

## 2022-01-08 DIAGNOSIS — R7303 Prediabetes: Secondary | ICD-10-CM

## 2022-01-08 DIAGNOSIS — W19XXXA Unspecified fall, initial encounter: Secondary | ICD-10-CM | POA: Diagnosis not present

## 2022-01-08 DIAGNOSIS — Z7982 Long term (current) use of aspirin: Secondary | ICD-10-CM | POA: Diagnosis not present

## 2022-01-08 DIAGNOSIS — I2510 Atherosclerotic heart disease of native coronary artery without angina pectoris: Secondary | ICD-10-CM | POA: Insufficient documentation

## 2022-01-08 DIAGNOSIS — Z85828 Personal history of other malignant neoplasm of skin: Secondary | ICD-10-CM | POA: Diagnosis not present

## 2022-01-08 DIAGNOSIS — I651 Occlusion and stenosis of basilar artery: Secondary | ICD-10-CM | POA: Diagnosis not present

## 2022-01-08 DIAGNOSIS — Q283 Other malformations of cerebral vessels: Secondary | ICD-10-CM | POA: Diagnosis not present

## 2022-01-08 DIAGNOSIS — G919 Hydrocephalus, unspecified: Secondary | ICD-10-CM | POA: Diagnosis not present

## 2022-01-08 DIAGNOSIS — R42 Dizziness and giddiness: Secondary | ICD-10-CM | POA: Diagnosis not present

## 2022-01-08 DIAGNOSIS — Z8673 Personal history of transient ischemic attack (TIA), and cerebral infarction without residual deficits: Secondary | ICD-10-CM | POA: Diagnosis present

## 2022-01-08 DIAGNOSIS — I639 Cerebral infarction, unspecified: Principal | ICD-10-CM | POA: Insufficient documentation

## 2022-01-08 DIAGNOSIS — I672 Cerebral atherosclerosis: Secondary | ICD-10-CM | POA: Diagnosis not present

## 2022-01-08 LAB — CBC
HCT: 38.9 % — ABNORMAL LOW (ref 39.0–52.0)
Hemoglobin: 12.7 g/dL — ABNORMAL LOW (ref 13.0–17.0)
MCH: 31.8 pg (ref 26.0–34.0)
MCHC: 32.6 g/dL (ref 30.0–36.0)
MCV: 97.3 fL (ref 80.0–100.0)
Platelets: 236 10*3/uL (ref 150–400)
RBC: 4 MIL/uL — ABNORMAL LOW (ref 4.22–5.81)
RDW: 13.6 % (ref 11.5–15.5)
WBC: 4.9 10*3/uL (ref 4.0–10.5)
nRBC: 0 % (ref 0.0–0.2)

## 2022-01-08 LAB — URINALYSIS, ROUTINE W REFLEX MICROSCOPIC
Bacteria, UA: NONE SEEN
Bilirubin Urine: NEGATIVE
Glucose, UA: NEGATIVE mg/dL
Hgb urine dipstick: NEGATIVE
Ketones, ur: NEGATIVE mg/dL
Leukocytes,Ua: NEGATIVE
Nitrite: NEGATIVE
Protein, ur: 30 mg/dL — AB
Specific Gravity, Urine: 1.013 (ref 1.005–1.030)
pH: 7 (ref 5.0–8.0)

## 2022-01-08 LAB — BASIC METABOLIC PANEL
Anion gap: 11 (ref 5–15)
BUN: 21 mg/dL (ref 8–23)
CO2: 25 mmol/L (ref 22–32)
Calcium: 8.8 mg/dL — ABNORMAL LOW (ref 8.9–10.3)
Chloride: 100 mmol/L (ref 98–111)
Creatinine, Ser: 0.96 mg/dL (ref 0.61–1.24)
GFR, Estimated: >60 mL/min (ref >60)
Glucose, Bld: 117 mg/dL — ABNORMAL HIGH (ref 70–99)
Potassium: 4.6 mmol/L (ref 3.5–5.1)
Sodium: 136 mmol/L (ref 135–145)

## 2022-01-08 LAB — TROPONIN I (HIGH SENSITIVITY)
Troponin I (High Sensitivity): 6 ng/L (ref <18)
Troponin I (High Sensitivity): 6 ng/L (ref <18)

## 2022-01-08 LAB — SARS CORONAVIRUS 2 BY RT PCR: SARS Coronavirus 2 by RT PCR: NEGATIVE

## 2022-01-08 MED ORDER — SODIUM CHLORIDE 0.9 % IV BOLUS
500.0000 mL | Freq: Once | INTRAVENOUS | Status: AC
  Administered 2022-01-08: 500 mL via INTRAVENOUS

## 2022-01-08 MED ORDER — SODIUM CHLORIDE 0.9 % IV BOLUS: 1000.0000 mL | Freq: Once | INTRAVENOUS | Status: DC

## 2022-01-08 MED ORDER — MECLIZINE HCL 25 MG PO TABS: 25.0000 mg | ORAL_TABLET | Freq: Once | ORAL | Status: DC

## 2022-01-08 MED ORDER — HYDRALAZINE HCL 50 MG PO TABS
25.0000 mg | ORAL_TABLET | Freq: Once | ORAL | Status: AC
Start: 2022-01-08 — End: 2022-01-08
  Administered 2022-01-08: 25 mg via ORAL
  Filled 2022-01-08: qty 1

## 2022-01-08 NOTE — ED Provider Notes (Signed)
[facility] Provider Note    Event Date/Time   First MD Initiated Contact with Patient 01/08/22 1722     (approximate)  History   Chief Complaint: Dizziness  HPI  [name] is a 88 y.o. male with a past medical history of CAD, gastric reflux, hypertension, presents to the emergency department for dizziness/lightheadedness.  According to the patient on Thursday his blood pressure medications were switched due to persistent hypertension.  He states since Friday he has been feeling somewhat lightheaded and weak at times denies any focal deficits.  Denies any cough congestion fever nausea vomiting or diarrhea, no dysuria.  Patient describes a feeling of off balance, states when he was walking this morning he had to hold onto something while ambulating which is atypical for him.  Denies any history of dizziness previously.  Patient is hypertensive in the emergency department 178/72.  Took his morning blood pressure medications but did not take his afternoon or evening medications as of yet.  Physical Exam   Triage Vital Signs: ED Triage Vitals  Enc Vitals Group     BP 01/08/22 1356 (!) 156/61     Pulse Rate 01/08/22 1356 (!) 57     Resp 01/08/22 1356 18     Temp 01/08/22 1356 97.7 F (36.5 C)     Temp Source 01/08/22 1356 Oral     SpO2 01/08/22 1356 98 %     Weight 01/08/22 1359 220 lb (99.8 kg)     Height 01/08/22 1359 5' 7.75" (1.721 m)     Head Circumference --      Peak Flow --      Pain Score 01/08/22 1359 0     Pain Loc --      Pain Edu? --      Excl. in [place]? --     Most recent vital signs: Vitals:   01/08/22 1356 01/08/22 1707  BP: (!) 156/61 (!) 178/72  Pulse: (!) 57 (!) 59  Resp: 18 20  Temp: 97.7 F (36.5 C)   SpO2: 98% 96%    General: Awake, no distress.  CV:  Good peripheral perfusion.  Regular rate and rhythm  Resp:  Normal effort.  Equal breath sounds bilaterally.  Abd:  No distention.  Soft, nontender.  No rebound or  guarding. Other:  1+ pedal edema.   ED Results / Procedures / Treatments   EKG  EKG viewed and interpreted by myself shows a sinus bradycardia 51 bpm with a narrow QRS, normal axis, slight PR prolongation otherwise normal intervals with nonspecific but no concerning ST changes.  RADIOLOGY  MRI shows a 1.7 cm acute to early subacute ischemic infarct in the cerebellum.   MEDICATIONS ORDERED IN ED: Medications  hydrALAZINE (APRESOLINE) tablet 25 mg (has no administration in time range)  sodium chloride 0.9 % bolus 500 mL (has no administration in time range)     IMPRESSION / MDM / ASSESSMENT AND PLAN / ED COURSE  I reviewed the triage vital signs and the nursing notes.  Patient's presentation is most consistent with acute presentation with potential threat to life or bodily function.  Patient presents to the emergency department for dizziness/feeling off balance.  Patient denies any spinning sensation.  States symptoms started on Friday after switching his blood pressure medications on Thursday.  Patient remains hypertensive in the emergency department no signs of hypotension.  Patient's basic lab work is largely reassuring including a normal chemistry with reassuring renal function normal CBC  and a negative troponin.  Repeat troponin is pending.  Urinalysis is normal.  Patient states this is never happened before.  Given the patient's off-balance sensation we will obtain an MRI of the brain to rule out CVA.  We will also obtain a COVID swab as well as a repeat troponin.  Differential would include medication reaction, vertigo, CVA.  Overall the patient appears well, no distress calm cooperative.  Patient's MRI is positive for cerebellar stroke.  Remainder the patient's work-up including lab work is largely [place].  Given the positive CVA on MRI we will admit to the hospital service for further work-up and treatment.  Symptoms have been ongoing for at least 2 days.    FINAL  CLINICAL IMPRESSION(S) / ED DIAGNOSES   Dizziness Acute CVA   Note:  This document was prepared using Dragon voice recognition software and may include unintentional dictation errors.   Harvest Dark, MD 01/08/22 2102

## 2022-01-08 NOTE — ED Provider Triage Note (Signed)
Emergency Medicine Provider Triage Evaluation Note  [name], a 88 y.o. male  was evaluated in triage.  Pt complains of dizziness and weak legs. He has been unable to walk due to the weakness. He has had a change to his BP meds last week. He denies vertigo, headache, vision change, or syncope.   Review of Systems  Positive: Leg weakness, dizziness Negative: CP, headache  Physical Exam  BP (!) 178/72 (BP Location: Left Arm)   Pulse (!) 59   Temp 97.7 F (36.5 C) (Oral)   Resp 20   Ht 5' 7.75" (1.721 m)   Wt 99.8 kg   SpO2 96%   BMI 33.70 kg/m  Gen:   Awake, no distress  NAD Resp:  Normal effort CTA MSK:   Moves extremities without difficulty  CVS:  RRR  Medical Decision Making  Medically screening exam initiated at 5:29 PM.  Appropriate orders placed.  [name] was informed that the remainder of the evaluation will be completed by another provider, this initial triage assessment does not replace that evaluation, and the importance of remaining in the ED until their evaluation is complete.  Geriatric to the ED for evaluation of dizziness and bilateral leg weakness.  Patient denies any headache, visual disturbance, or syncope.  He also denies any distal paresthesias or recent injury.   Melvenia Needles, PA-C 01/08/22 1829

## 2022-01-08 NOTE — ED Notes (Signed)
Pt back from CT/MRI

## 2022-01-08 NOTE — ED Notes (Signed)
ED Provider at bedside. 

## 2022-01-08 NOTE — ED Triage Notes (Signed)
Pt has had mild dizziness yesterday but wife reports unable to walk today r/t dizziness.  Denies any pain.  Did switch BP meds less than week ago.  Was previously dizzy even while sitting still but that has resolved.  Has had increased urination over last week or so.

## 2022-01-08 NOTE — H&P (Signed)
History and Physical    [name] OFB:[id number] DOB: 1932-03-19 DOA: 01/08/2022  PCP: Ria Bush, MD  Patient coming from: home  I have personally briefly reviewed patient's old medical records in [place]  Chief Complaint: dizziness and feeling of balance starting around 10 am morning of presentation  HPI: [name] is a 88 y.o. male with medical history significant of   b/l  low grade carotid A stenosis , HTN, HLD,GERD,  OSA on cpap,Gout, malignant neoplasm of larynx s/p xrt, Pre-diabetes who presents to the ED with complaints of being off balance and dizzy since around 10 am day of admission. He notes no n/v/d/diarrhea/ chest pain /sob / fever/ chills/fall / focal weakness , paresthesias or confusion with his symptoms.  ED Course:  Vitals: afeb, BP 156/61, hr 57, rr 18, sat 98%  Labs: Ua neg POE:UMPNT bradycardia, Wbc:4.9, hgb 12.7, plt 236 NA 136, K 4.6, Glu 117, cr 0.96 CE6 Tx  Hydralazine 25 mg po x 1, ns 500 ml Covid neg MRI brain IMPRESSION: 1. 1.7 cm acute to early subacute ischemic nonhemorrhagic central cerebellar infarct. 2. Age-related cerebral atrophy with mild chronic small vessel ischemic disease.   Review of Systems: As per HPI otherwise 10 point review of systems negative.   Past Medical History:  Diagnosis Date   Actinic keratosis    Carotid stenosis, asymptomatic, bilateral 09/16/2016   Diffuse 1-39% bilaterally, but involving ICA, ECA, CCA - rpt 1 yr (10/1441)   Complication of anesthesia    slow to wake up after a colonoscopy years ago   Diverticulitis    GERD (gastroesophageal reflux disease)    Gout    History of osteomyelitis 10/2015   left foot   History of squamous cell carcinoma of skin    HTN (hypertension)    OSA on CPAP 09/01/2016   Osteoarthritis    back, neck, knees - multiple joints   Personal history of malignant neoplasm of larynx 2009   s/p XRT, Skin Cancer- "pre cancerous"   Pre-diabetes    Seasonal  allergies    Sensorineural hearing loss (SNHL) of both ears 09/01/2016   Squamous cell carcinoma of skin unknown   Treated in [name] [facility] post auricular     Past Surgical History:  Procedure Laterality Date   ACHILLES TENDON REPAIR Left    AMPUTATION Left 05/16/2019   LEFT GREAT TOE AMPUTATION for osteomyelitis Sharol Given, Illene Regulus, MD), 2nd toe removed   APPENDECTOMY     COLONOSCOPY     THROAT SURGERY N/A 2009   ?vocal cord vs larynx nodule removal, had XRT after this but states it was not cancer   TONSILLECTOMY     TREATMENT FISTULA ANAL       reports that he has never smoked. He has never used smokeless tobacco. He reports current alcohol use. He reports that he does not use drugs.  Allergies  Allergen Reactions   Lisinopril Cough    Family History  Problem Relation Age of Onset   Hypertension Mother    Hypertension Father    Cancer Other        unknown - niece   CAD Sister 63       stent (80% blockage)   CAD Brother 74       stent (100% blockage)    Prior to Admission medications   Medication Sig Start Date End Date Taking? Authorizing Provider  acetaminophen (TYLENOL) 500 MG tablet Take 1-2 tablets (  500-1,000 mg total) by mouth 2 (two) times daily as needed for moderate pain. Patient taking differently: Take 500-1,000 mg by mouth 2 (two) times daily as needed for moderate pain. Pt states taking 3 tablets 02/17/20  Yes Ria Bush, MD  allopurinol (ZYLOPRIM) 100 MG tablet TAKE 2 TABLETS(200 MG) BY MOUTH DAILY 08/26/21  Yes Ria Bush, MD  amLODipine (NORVASC) 10 MG tablet Take 1 tablet (10 mg total) by mouth daily. 10/05/21  Yes Ria Bush, MD  aspirin EC 81 MG tablet Take 81 mg by mouth daily.   Yes [provider]  azelastine (ASTELIN) 0.1 % nasal spray Place 1-2 sprays into both nostrils 2 (two) times daily. Use in each nostril as directed 08/31/21  Yes Ria Bush, MD  cetirizine (ZYRTEC) 10 MG tablet Take 10 mg by mouth  daily. Alternates weekly with allegra.   Yes [provider]  donepezil (ARICEPT) 5 MG tablet Take 1 tablet (5 mg total) by mouth at bedtime. 09/16/21  Yes Ria Bush, MD  esomeprazole ([place]) 20 MG capsule TAKE ONE CAPSULE BY MOUTH DAILY AT 12:00 PM 03/09/21  Yes Ria Bush, MD  fexofenadine (ALLEGRA) 180 MG tablet Take 180 mg by mouth daily. Alternates weekly with zyrtec.   Yes [provider]  GLUCOSAMINE-CHONDROITIN PO Take 2 tablets by mouth daily.   Yes [provider]  hydrALAZINE (APRESOLINE) 25 MG tablet Take 1 tablet (25 mg total) by mouth 3 (three) times daily. 01/03/22  Yes Ria Bush, MD  Magnesium 250 MG TABS Take 1 tablet (250 mg total) by mouth daily. 07/05/20  Yes Ria Bush, MD  Multiple Vitamins-Minerals (MULTIVITAMIN ADULT PO) Take 1 tablet by mouth daily.   Yes [provider]  polyethylene glycol (MIRALAX / GLYCOLAX) packet Take 8.5 g by mouth daily.    Yes [provider]  Potassium 99 MG TABS Take 99 mg by mouth daily.   Yes [provider]  carboxymethylcellulose (REFRESH TEARS) 0.5 % SOLN Place 1 drop into both eyes daily as needed (dry eyes).    [provider]  losartan (COZAAR) 100 MG tablet TAKE 1 TABLET(100 MG) BY MOUTH DAILY Patient not taking: Reported on 01/08/2022 03/09/21   Ria Bush, MD  naproxen sodium (ANAPROX) 275 MG tablet Take 1 tablet (275 mg total) by mouth 2 (two) times daily with a meal. Patient not taking: Reported on 01/08/2022 05/20/21   Sable Feil, PA-C    Physical Exam: Vitals:   01/08/22 1900 01/08/22 2055 01/08/22 2130 01/08/22 2300  BP: (!) 146/67 (!) 150/66 (!) 153/63 (!) 162/68  Pulse: 60 (!) 57 (!) 56 (!) 51  Resp: '20 20 20 20  '$ Temp: 98 F (36.7 C) 98.7 F (37.1 C) 98.4 F (36.9 C) 98.1 F (36.7 C)  TempSrc:      SpO2:  98% 97% 98%  Weight:      Height:         Vitals:   01/08/22 1900 01/08/22 2055 01/08/22 2130 01/08/22 2300  BP:  (!) 146/67 (!) 150/66 (!) 153/63 (!) 162/68  Pulse: 60 (!) 57 (!) 56 (!) 51  Resp: '20 20 20 20  '$ Temp: 98 F (36.7 C) 98.7 F (37.1 C) 98.4 F (36.9 C) 98.1 F (36.7 C)  TempSrc:      SpO2:  98% 97% 98%  Weight:      Height:      Constitutional: NAD, calm, comfortable Eyes: PERRL, lids and conjunctivae normal ENMT: Mucous membranes are moist. Posterior pharynx clear  of any exudate or lesions.Normal dentition.  Neck: normal, supple, no masses, no thyromegaly Respiratory: clear to auscultation bilaterally, no wheezing, no crackles. Normal respiratory effort. No accessory muscle use.  Cardiovascular: Regular rate and rhythm, no murmurs / rubs / gallops. No extremity edema. + pedal pulses.   Abdomen: no tenderness, no masses palpated. No hepatosplenomegaly. Bowel sounds positive.  Musculoskeletal: no clubbing / cyanosis. No joint deformity upper and lower extremities. Good ROM, no contractures. Normal muscle tone.  Skin: no rashes, lesions, ulcers. No induration Neurologic: CN 2-12 grossly intact. Sensation intact,  Strength 5/5 in all 4.  Psychiatric: Normal judgment and insight. Alert and oriented x 3. Normal mood.    Labs on Admission: I have personally reviewed following labs and imaging studies  CBC: Recent Labs  Lab 01/08/22 1407  WBC 4.9  HGB 12.7*  HCT 38.9*  MCV 97.3  PLT 510   Basic Metabolic Panel: Recent Labs  Lab 01/08/22 1407  NA 136  K 4.6  CL 100  CO2 25  GLUCOSE 117*  BUN 21  CREATININE 0.96  CALCIUM 8.8*   GFR: Estimated Creatinine Clearance: 60.6 mL/min (by C-G formula based on SCr of 0.96 mg/dL). Liver Function Tests: No results for input(s): "AST", "ALT", "ALKPHOS", "BILITOT", "PROT", "ALBUMIN" in the last 168 hours. No results for input(s): "LIPASE", "AMYLASE" in the last 168 hours. No results for input(s): "AMMONIA" in the last 168 hours. Coagulation Profile: No results for input(s): "INR", "PROTIME" in the last 168 hours. Cardiac  Enzymes: No results for input(s): "CKTOTAL", "CKMB", "CKMBINDEX", "TROPONINI" in the last 168 hours. BNP (last 3 results) No results for input(s): "PROBNP" in the last 8760 hours. HbA1C: No results for input(s): "HGBA1C" in the last 72 hours. CBG: No results for input(s): "GLUCAP" in the last 168 hours. Lipid Profile: No results for input(s): "CHOL", "HDL", "LDLCALC", "TRIG", "CHOLHDL", "LDLDIRECT" in the last 72 hours. Thyroid Function Tests: No results for input(s): "TSH", "T4TOTAL", "FREET4", "T3FREE", "THYROIDAB" in the last 72 hours. Anemia Panel: No results for input(s): "VITAMINB12", "FOLATE", "FERRITIN", "TIBC", "IRON", "RETICCTPCT" in the last 72 hours. Urine analysis:    Component Value Date/Time   COLORURINE YELLOW (A) 01/08/2022 1357   APPEARANCEUR CLEAR (A) 01/08/2022 1357   LABSPEC 1.013 01/08/2022 1357   PHURINE 7.0 01/08/2022 1357   GLUCOSEU NEGATIVE 01/08/2022 1357   HGBUR NEGATIVE 01/08/2022 1357   [place] 01/08/2022 1357   KETONESUR NEGATIVE 01/08/2022 1357   PROTEINUR 30 (A) 01/08/2022 1357   NITRITE NEGATIVE 01/08/2022 1357   LEUKOCYTESUR NEGATIVE 01/08/2022 1357    Radiological Exams on Admission: MR BRAIN WO CONTRAST  Result Date: 01/08/2022 CLINICAL DATA:  Initial evaluation for acute dizziness. EXAM: MRI HEAD WITHOUT CONTRAST TECHNIQUE: Multiplanar, multiecho pulse sequences of the brain and surrounding structures were obtained without intravenous contrast. COMPARISON:  None Available. FINDINGS: Brain: Age-related cerebral atrophy. Patchy T2/FLAIR hyperintensity within the periventricular in deep white matter both cerebral hemispheres, most consistent with chronic small vessel ischemic disease, mild for age. 1.7 cm linear focus of restricted diffusion seen involving the central cerebellum, consistent with a small acute to early subacute ischemic infarct (series 5, image 16). No associated hemorrhage or mass effect. No other evidence for acute or  subacute ischemia. Gray-white matter differentiation otherwise maintained. No other areas of chronic cortical infarction. No acute intracranial hemorrhage. Single punctate chronic microhemorrhage noted at the right frontal corona radiata, of doubtful significance in isolation. No mass lesion, midline shift or mass effect. No hydrocephalus or extra fluid  collection. Pituitary gland suprasellar region within normal limits. Vascular: Major intracranial vascular flow voids are grossly maintained. Skull and upper cervical spine: Craniocervical junction within normal limits. Bone marrow signal intensity normal. Sinuses/Orbits: Patient status post bilateral ocular lens replacement. Paranasal sinuses are largely clear. No significant mastoid effusion. Other: None. IMPRESSION: 1. 1.7 cm acute to early subacute ischemic nonhemorrhagic central cerebellar infarct. 2. Age-related cerebral atrophy with mild chronic small vessel ischemic disease. Electronically Signed   By: Jeannine Boga M.D.   On: 01/08/2022 20:28    EKG: Independently reviewed. See above  Assessment/Plan CVA -hx of b/l  low grade carotid A stenosis  -off balance starting 10 am morning of admission -MRI-subacute cerebellar cva -CTA ,echocardiogram pending  -symptoms improved  - consult neurology in am -admit cva  protocol -A1c/HLD pending -Permissive HTN x48 hours from sx onset  -goal BP < 220/110, PRN above these parameters - start asa 81, plavix 75 mg f/u with neuro recs  -neuro checks , SLP, PT/OT  -await final neuro recs     HTN -permissive HTN, holding BP meds currently    HLD -continue statin   GERD -ppi   OSA  -on cpap qhs at home resume   Gout -no active issues    Malignant neoplasm of larynx  -s/p xrt   Pre-diabetes -check a1c  -monitor with poc glucose    DVT prophylaxis: heparin Code Status: FULL Family Communication: none at bedside Disposition Plan: patient  expected to be admitted less than 2  midnights  Consults called: neuro Dr Quinn Axe Admission status:  progressive care   Clance Boll MD [facility]   If 7PM-7AM, please contact night-coverage [email or web address] Password [facility]  01/08/2022, 11:36 PM

## 2022-01-08 NOTE — ED Notes (Signed)
Report received from [facility].  Care assumed

## 2022-01-08 NOTE — ED Notes (Signed)
First nurse note: Pt arrives via EMS for feeling dizzy when standing- unsteady on his feet- VSS, bp 153/73

## 2022-01-08 NOTE — ED Notes (Signed)
Patient transported to CT 

## 2022-01-09 ENCOUNTER — Inpatient Hospital Stay: Payer: Medicare Other

## 2022-01-09 ENCOUNTER — Inpatient Hospital Stay (HOSPITAL_BASED_OUTPATIENT_CLINIC_OR_DEPARTMENT_OTHER)
Admit: 2022-01-09 | Discharge: 2022-01-09 | Disposition: A | Discharge status: Home / Self Care | Payer: Medicare Other | Attending: Internal Medicine | Admitting: Internal Medicine

## 2022-01-09 DIAGNOSIS — I651 Occlusion and stenosis of basilar artery: Secondary | ICD-10-CM | POA: Diagnosis not present

## 2022-01-09 DIAGNOSIS — R7303 Prediabetes: Secondary | ICD-10-CM

## 2022-01-09 DIAGNOSIS — Z8673 Personal history of transient ischemic attack (TIA), and cerebral infarction without residual deficits: Secondary | ICD-10-CM

## 2022-01-09 DIAGNOSIS — G4733 Obstructive sleep apnea (adult) (pediatric): Secondary | ICD-10-CM | POA: Diagnosis not present

## 2022-01-09 DIAGNOSIS — I10 Essential (primary) hypertension: Secondary | ICD-10-CM

## 2022-01-09 DIAGNOSIS — I639 Cerebral infarction, unspecified: Secondary | ICD-10-CM | POA: Diagnosis present

## 2022-01-09 DIAGNOSIS — I6389 Other cerebral infarction: Secondary | ICD-10-CM

## 2022-01-09 DIAGNOSIS — Z9989 Dependence on other enabling machines and devices: Secondary | ICD-10-CM

## 2022-01-09 DIAGNOSIS — K219 Gastro-esophageal reflux disease without esophagitis: Secondary | ICD-10-CM

## 2022-01-09 DIAGNOSIS — E785 Hyperlipidemia, unspecified: Secondary | ICD-10-CM | POA: Insufficient documentation

## 2022-01-09 DIAGNOSIS — Z8521 Personal history of malignant neoplasm of larynx: Secondary | ICD-10-CM | POA: Diagnosis not present

## 2022-01-09 DIAGNOSIS — I672 Cerebral atherosclerosis: Secondary | ICD-10-CM | POA: Diagnosis not present

## 2022-01-09 DIAGNOSIS — Q283 Other malformations of cerebral vessels: Secondary | ICD-10-CM | POA: Diagnosis not present

## 2022-01-09 DIAGNOSIS — G919 Hydrocephalus, unspecified: Secondary | ICD-10-CM | POA: Diagnosis not present

## 2022-01-09 HISTORY — DX: Prediabetes: R73.03

## 2022-01-09 HISTORY — DX: Personal history of transient ischemic attack (TIA), and cerebral infarction without residual deficits: Z86.73

## 2022-01-09 LAB — CBC
HCT: 36 % — ABNORMAL LOW (ref 39.0–52.0)
Hemoglobin: 12 g/dL — ABNORMAL LOW (ref 13.0–17.0)
MCH: 31.8 pg (ref 26.0–34.0)
MCHC: 33.3 g/dL (ref 30.0–36.0)
MCV: 95.5 fL (ref 80.0–100.0)
Platelets: 221 10*3/uL (ref 150–400)
RBC: 3.77 MIL/uL — ABNORMAL LOW (ref 4.22–5.81)
RDW: 13.5 % (ref 11.5–15.5)
WBC: 6.4 10*3/uL (ref 4.0–10.5)
nRBC: 0 % (ref 0.0–0.2)

## 2022-01-09 LAB — LIPID PANEL
Cholesterol: 136 mg/dL (ref 0–200)
HDL: 41 mg/dL (ref >40)
LDL Cholesterol: 67 mg/dL (ref 0–99)
Total CHOL/HDL Ratio: 3.3 RATIO
Triglycerides: 138 mg/dL (ref <150)
VLDL: 28 mg/dL (ref 0–40)

## 2022-01-09 LAB — MAGNESIUM: Magnesium: 2.3 mg/dL (ref 1.7–2.4)

## 2022-01-09 LAB — ECHOCARDIOGRAM COMPLETE
AR max vel: 3.08 cm2
AV Peak grad: 6.6 mmHg
Ao pk vel: 1.28 m/s
Area-P 1/2: 2.48 cm2
Calc EF: 57.8 %
Height: 67.75 in
S' Lateral: 3.89 cm
Single Plane A2C EF: 58.3 %
Single Plane A4C EF: 55.8 %
Weight: 3520 oz

## 2022-01-09 LAB — CREATININE, SERUM
Creatinine, Ser: 1.05 mg/dL (ref 0.61–1.24)
GFR, Estimated: >60 mL/min (ref >60)

## 2022-01-09 LAB — HEMOGLOBIN A1C
Hgb A1c MFr Bld: 5.6 % (ref 4.8–5.6)
Mean Plasma Glucose: 114.02 mg/dL

## 2022-01-09 MED ORDER — SENNOSIDES-DOCUSATE SODIUM 8.6-50 MG PO TABS: 1.0000 | ORAL_TABLET | Freq: Every evening | ORAL | Status: DC | PRN

## 2022-01-09 MED ORDER — ASPIRIN 325 MG PO TABS
325.0000 mg | ORAL_TABLET | Freq: Once | ORAL | Status: AC
  Administered 2022-01-09: 325 mg via ORAL
  Filled 2022-01-09: qty 1

## 2022-01-09 MED ORDER — ACETAMINOPHEN 650 MG RE SUPP: 650.0000 mg | RECTAL | Status: DC | PRN

## 2022-01-09 MED ORDER — IOHEXOL 350 MG/ML SOLN
75.0000 mL | Freq: Once | INTRAVENOUS | Status: AC | PRN
  Administered 2022-01-09: 75 mL via INTRAVENOUS

## 2022-01-09 MED ORDER — PERFLUTREN LIPID MICROSPHERE
1.0000 mL | INTRAVENOUS | Status: AC | PRN
  Administered 2022-01-09: 5 mL via INTRAVENOUS

## 2022-01-09 MED ORDER — DONEPEZIL HCL 5 MG PO TABS
5.0000 mg | ORAL_TABLET | Freq: Every day | ORAL | Status: DC
  Administered 2022-01-09 (×2): 5 mg via ORAL
  Filled 2022-01-09 (×2): qty 1

## 2022-01-09 MED ORDER — ACETAMINOPHEN 160 MG/5ML PO SOLN: 650.0000 mg | ORAL | Status: DC | PRN

## 2022-01-09 MED ORDER — PANTOPRAZOLE SODIUM 40 MG PO TBEC
40.0000 mg | DELAYED_RELEASE_TABLET | Freq: Every day | ORAL | Status: DC
  Administered 2022-01-09 – 2022-01-10 (×2): 40 mg via ORAL
  Filled 2022-01-09 (×2): qty 1

## 2022-01-09 MED ORDER — ACETAMINOPHEN 325 MG PO TABS: 650.0000 mg | ORAL_TABLET | ORAL | Status: DC | PRN

## 2022-01-09 MED ORDER — HEPARIN SODIUM (PORCINE) 5000 UNIT/ML IJ SOLN
5000.0000 [IU] | Freq: Three times a day (TID) | INTRAMUSCULAR | Status: DC
  Administered 2022-01-10: 5000 [IU] via SUBCUTANEOUS
  Filled 2022-01-09: qty 1

## 2022-01-09 MED ORDER — ASPIRIN 81 MG PO TBEC
81.0000 mg | DELAYED_RELEASE_TABLET | Freq: Every day | ORAL | Status: DC
  Administered 2022-01-09 – 2022-01-10 (×2): 81 mg via ORAL
  Filled 2022-01-09 (×2): qty 1

## 2022-01-09 MED ORDER — STROKE: EARLY STAGES OF RECOVERY BOOK: Freq: Once | Status: DC

## 2022-01-09 MED ORDER — AMLODIPINE BESYLATE 5 MG PO TABS
5.0000 mg | ORAL_TABLET | Freq: Every day | ORAL | Status: DC
  Administered 2022-01-09 – 2022-01-10 (×2): 5 mg via ORAL
  Filled 2022-01-09 (×2): qty 1

## 2022-01-09 MED ORDER — ALLOPURINOL 100 MG PO TABS
200.0000 mg | ORAL_TABLET | Freq: Every day | ORAL | Status: DC
  Administered 2022-01-09 – 2022-01-10 (×2): 200 mg via ORAL
  Filled 2022-01-09 (×2): qty 2

## 2022-01-09 MED ORDER — CLOPIDOGREL BISULFATE 75 MG PO TABS
75.0000 mg | ORAL_TABLET | Freq: Every day | ORAL | Status: DC
  Administered 2022-01-09 – 2022-01-10 (×3): 75 mg via ORAL
  Filled 2022-01-09 (×3): qty 1

## 2022-01-09 MED ORDER — ACETAMINOPHEN 500 MG PO TABS: 500.0000 mg | ORAL_TABLET | Freq: Two times a day (BID) | ORAL | Status: DC | PRN

## 2022-01-09 NOTE — ED Notes (Signed)
Patient transported to CT 

## 2022-01-09 NOTE — ED Notes (Signed)
Pt's wife called back and updated.

## 2022-01-09 NOTE — Progress Notes (Signed)
OT Cancellation Note  Patient Details Name: [name] MRN: [id number] DOB: February 10, 1932   Cancelled Treatment:    Reason Eval/Treat Not Completed: OT screened, no needs identified, will sign off. OT orders received, chart reviewed, spoke to RN. Patient reports walking to bathroom earlier today without an AD. Completed toileting, peri care, clothing management, and bed mobility independently. Pt with intact light touch sensation, intact finger to nose, no numbness/tingling in hands or feet, and no acute vision changes. No further skilled OT services indicated as pt appears at his baseline. Pt agreed. RN to reconsult if any change in status while admitted.    Lanelle Bal  [facility] 01/09/2022, 4:10 PM

## 2022-01-09 NOTE — ED Notes (Signed)
Report given to [facility].  Pt transported to room 30.

## 2022-01-09 NOTE — Progress Notes (Signed)
PROGRESS NOTE    Paul JUDSON  Robles:[id number] DOB: [birth year] DOA: 01/08/2022 PCP: Ria Bush, MD    Brief Narrative:  [name] is a 88 y.o. male with medical history significant of   b/l  low grade carotid A stenosis , HTN, HLD,GERD,  OSA on cpap,Gout, malignant neoplasm of larynx s/p xrt, Pre-diabetes who presents to the ED with complaints of being off balance and dizzy . Found with Acute to early subacute ischemic nonhemorrhagic infarct. Vascular and neurology consulted.  9/4vascular consulted for cta findings  Consultants:  neurology  Procedures:  CTA 1. Occlusion of the dominant mid-distal left V1 segment, age indeterminate, but could be acute in nature. Distal reconstitution at the left V2 segment via cervical collaterals. Additional extensive atheromatous change about the intracranial V4 segments with severe multifocal stenoses as above. 2. Severe 85% stenosis at the origin of the right subclavian artery. 3. Short-segment 50% stenosis about the distal cervical left ICA. 4. 70% atheromatous stenosis about the right carotid bulb. 5. Extensive atheromatous disease throughout the carotid siphons with associated moderate multifocal stenoses.    MRI brain 1. 1.7 cm acute to early subacute ischemic nonhemorrhagic central cerebellar infarct. 2. Age-related cerebral atrophy with mild chronic small vessel ischemic disease.       Antimicrobials:      Subjective: Feels better. No dizziness, sob, cp  Objective: Vitals:   01/09/22 1035 01/09/22 1045 01/09/22 1314 01/09/22 1343  BP:  (!) 148/68  131/61  Pulse:  (!) 57 64 (!) 58  Resp:  '14 15 17  '$ Temp:      TempSrc: Oral     SpO2:  96% 97% 96%  Weight:      Height:        Intake/Output Summary (Last 24 hours) at 01/09/2022 1453 Last data filed at 01/08/2022 1845 Gross per 24 hour  Intake 500 ml  Output --  Net 500 ml   Filed Weights   01/08/22 1359  Weight: 99.8 kg    Examination:  Calm,  NAD Cta no w/r Reg s1/s2 no gallop Soft benign +bs No edema Aaoxox3 , grossly intact 5 out of 5 strength x4, no facial droop Mood and affect appropriate in current setting     Data Reviewed: I have personally reviewed following labs and imaging studies  CBC: Recent Labs  Lab 01/08/22 1407 01/09/22 0111  WBC 4.9 6.4  HGB 12.7* 12.0*  HCT 38.9* 36.0*  MCV 97.3 95.5  PLT 236 419   Basic Metabolic Panel: Recent Labs  Lab 01/08/22 1407 01/09/22 0111  NA 136  --   K 4.6  --   CL 100  --   CO2 25  --   GLUCOSE 117*  --   BUN 21  --   CREATININE 0.96 1.05  CALCIUM 8.8*  --   MG  --  2.3   GFR: Estimated Creatinine Clearance: 55.4 mL/min (by C-G formula based on SCr of 1.05 mg/dL). Liver Function Tests: No results for input(s): "AST", "ALT", "ALKPHOS", "BILITOT", "PROT", "ALBUMIN" in the last 168 hours. No results for input(s): "LIPASE", "AMYLASE" in the last 168 hours. No results for input(s): "AMMONIA" in the last 168 hours. Coagulation Profile: No results for input(s): "INR", "PROTIME" in the last 168 hours. Cardiac Enzymes: No results for input(s): "CKTOTAL", "CKMB", "CKMBINDEX", "TROPONINI" in the last 168 hours. BNP (last 3 results) No results for input(s): "PROBNP" in the last 8760 hours. HbA1C: Recent Labs    01/09/22 0111  HGBA1C  5.6   CBG: No results for input(s): "GLUCAP" in the last 168 hours. Lipid Profile: Recent Labs    01/09/22 0111  CHOL 136  HDL 41  LDLCALC 67  TRIG 138  CHOLHDL 3.3   Thyroid Function Tests: No results for input(s): "TSH", "T4TOTAL", "FREET4", "T3FREE", "THYROIDAB" in the last 72 hours. Anemia Panel: No results for input(s): "VITAMINB12", "FOLATE", "FERRITIN", "TIBC", "IRON", "RETICCTPCT" in the last 72 hours. Sepsis Labs: No results for input(s): "PROCALCITON", "LATICACIDVEN" in the last 168 hours.  Recent Results (from the past 240 hour(s))  SARS Coronavirus 2 by RT PCR (hospital order, performed in [facility]  hospital lab) *cepheid single result test* Anterior Nasal Swab     Status: None   Collection Time: 01/08/22  6:15 PM   Specimen: Anterior Nasal Swab  Result Value Ref Range Status   SARS Coronavirus 2 by RT PCR NEGATIVE NEGATIVE Final    Comment: (NOTE) SARS-CoV-2 target nucleic acids are NOT DETECTED.  The SARS-CoV-2 RNA is generally detectable in upper and lower respiratory specimens during the acute phase of infection. The lowest concentration of SARS-CoV-2 viral copies this assay can detect is 250 copies / mL. A negative result does not preclude SARS-CoV-2 infection and should not be used as the sole basis for treatment or other patient management decisions.  A negative result may occur with improper specimen collection / handling, submission of specimen other than nasopharyngeal swab, presence of viral mutation(s) within the areas targeted by this assay, and inadequate number of viral copies (<250 copies / mL). A negative result must be combined with clinical observations, patient history, and epidemiological information.  Fact Sheet for Patients:   https://www.patel.info/  Fact Sheet for Healthcare Providers: https://hall.com/  This test is not yet approved or  cleared by the [place] FDA and has been authorized for detection and/or diagnosis of SARS-CoV-2 by FDA under an Emergency Use Authorization (EUA).  This EUA will remain in effect (meaning this test can be used) for the duration of the COVID-19 declaration under Section 564(b)(1) of the Act, 21 U.S.C. section 360bbb-3(b)(1), unless the authorization is terminated or revoked sooner.  Performed at [facility], [address], [place], [place] [address]          Radiology Studies: ECHOCARDIOGRAM COMPLETE  Result Date: 01/09/2022    ECHOCARDIOGRAM REPORT   Patient Name:   [name] Date of Exam: 01/09/2022 Medical Rec #:  [id number]        Height:        67.8 in Accession #:    [id number]       Weight:       220.0 lb Date of Birth:  1932-03-20        BSA:          2.123 m Patient Age:    61 years         BP:           158/70 mmHg Patient Gender: M                HR:           57 bpm. Exam Location:  [facility] Procedure: 2D Echo and Intracardiac Opacification Agent Indications:     Stroke I63.9  History:         Patient has no prior history of Echocardiogram examinations.  Sonographer:     Kathlen Brunswick RDCS Referring Phys:  [id number] Victoriano Lain A THOMAS Diagnosing Phys: Nelva Bush MD  Sonographer Comments:  Technically difficult study due to poor echo windows and suboptimal subcostal window. Image acquisition challenging due to respiratory motion. IMPRESSIONS  1. Left ventricular ejection fraction, by estimation, is 60 to 65%. The left ventricle has normal function. The left ventricle has no regional wall motion abnormalities. There is moderate left ventricular hypertrophy. Left ventricular diastolic parameters are consistent with Grade I diastolic dysfunction (impaired relaxation). Elevated left atrial pressure.  2. Right ventricular systolic function is normal. The right ventricular size is normal. Tricuspid regurgitation signal is inadequate for assessing PA pressure.  3. The mitral valve was not well visualized. Trivial mitral valve regurgitation. No evidence of mitral stenosis.  4. The aortic valve was not well visualized. Aortic valve regurgitation is not visualized. No aortic stenosis is present.  5. Aortic dilatation noted. There is borderline dilatation of the aortic root, measuring 38 mm. FINDINGS  Left Ventricle: Left ventricular ejection fraction, by estimation, is 60 to 65%. The left ventricle has normal function. The left ventricle has no regional wall motion abnormalities. Definity contrast agent was given IV to delineate the left ventricular  endocardial borders. The left ventricular internal cavity size was normal in size. There is moderate left  ventricular hypertrophy. Left ventricular diastolic parameters are consistent with Grade I diastolic dysfunction (impaired relaxation). Elevated left atrial pressure. Right Ventricle: The right ventricular size is normal. No increase in right ventricular wall thickness. Right ventricular systolic function is normal. Tricuspid regurgitation signal is inadequate for assessing PA pressure. Left Atrium: Left atrial size was normal in size. Right Atrium: Right atrial size was normal in size. Pericardium: There is no evidence of pericardial effusion. Presence of epicardial fat layer. Mitral Valve: The mitral valve was not well visualized. Mild mitral annular calcification. Trivial mitral valve regurgitation. No evidence of mitral valve stenosis. Tricuspid Valve: The tricuspid valve is not well visualized. Tricuspid valve regurgitation is not demonstrated. Aortic Valve: The aortic valve was not well visualized. Aortic valve regurgitation is not visualized. No aortic stenosis is present. Aortic valve peak gradient measures 6.6 mmHg. Pulmonic Valve: The pulmonic valve was not well visualized. Aorta: Aortic dilatation noted. There is borderline dilatation of the aortic root, measuring 38 mm. Pulmonary Artery: The pulmonary artery is not well seen. Venous: The inferior vena cava was not well visualized. IAS/Shunts: The interatrial septum was not well visualized.  LEFT VENTRICLE PLAX 2D LVIDd:         5.62 cm      Diastology LVIDs:         3.89 cm      LV e' medial:    5.55 cm/s LV PW:         1.40 cm      LV E/e' medial:  15.4 LV IVS:        1.24 cm      LV e' lateral:   6.53 cm/s LVOT diam:     2.10 cm      LV E/e' lateral: 13.1 LV SV:         90 LV SV Index:   42 LVOT Area:     3.46 cm  LV Volumes (MOD) LV vol d, MOD A2C: 104.0 ml LV vol d, MOD A4C: 113.0 ml LV vol s, MOD A2C: 43.4 ml LV vol s, MOD A4C: 49.9 ml LV SV MOD A2C:     60.6 ml LV SV MOD A4C:     113.0 ml LV SV MOD BP:      63.9 ml RIGHT VENTRICLE RV Basal  diam:   3.68 cm RV S prime:     15.10 cm/s TAPSE (M-mode): 2.6 cm LEFT ATRIUM             Index        RIGHT ATRIUM           Index LA diam:        3.50 cm 1.65 cm/m   RA Area:     14.90 cm LA Vol (A2C):   50.4 ml 23.74 ml/m  RA Volume:   35.80 ml  16.86 ml/m LA Vol (A4C):   47.4 ml 22.33 ml/m LA Biplane Vol: 51.0 ml 24.02 ml/m  AORTIC VALVE AV Area (Vmax): 3.08 cm AV Vmax:        128.00 cm/s AV Peak Grad:   6.6 mmHg LVOT Vmax:      114.00 cm/s LVOT Vmean:     79.200 cm/s LVOT VTI:       0.259 m  AORTA Ao Root diam: 3.80 cm MITRAL VALVE MV Area (PHT): 2.48 cm     SHUNTS MV Decel Time: 306 msec     Systemic VTI:  0.26 m MV E velocity: 85.70 cm/s   Systemic Diam: 2.10 cm MV A velocity: 108.00 cm/s MV E/A ratio:  0.79 Harrell Gave End MD Electronically signed by Nelva Bush MD Signature Date/Time: 01/09/2022/11:30:00 AM    Final    CT ANGIO HEAD NECK W WO CM  Result Date: 01/09/2022 CLINICAL DATA:  Follow-up examination for stroke. EXAM: CT ANGIOGRAPHY HEAD AND NECK TECHNIQUE: Multidetector CT imaging of the head and neck was performed using the standard protocol during bolus administration of intravenous contrast. Multiplanar CT image reconstructions and MIPs were obtained to evaluate the vascular anatomy. Carotid stenosis measurements (when applicable) are obtained utilizing NASCET criteria, using the distal internal carotid diameter as the denominator. RADIATION DOSE REDUCTION: This exam was performed according to the departmental dose-optimization program which includes automated exposure control, adjustment of the mA and/or kV according to patient size and/or use of iterative reconstruction technique. CONTRAST:  5m OMNIPAQUE IOHEXOL 350 MG/ML SOLN COMPARISON:  MRI from 01/08/2022. FINDINGS: CT HEAD FINDINGS Brain: Age-related cerebral atrophy with chronic small vessel ischemic disease. Previously identified cerebellar infarct not well visualized by CT. No acute intracranial hemorrhage. No other acute large  vessel territory infarct. No mass lesion or midline shift. Mild ventricular prominence related to global parenchymal volume loss of hydrocephalus. No extra-axial fluid collection. Vascular: No hyperdense vessel. Calcified atherosclerosis present at skull base. Skull: Scalp soft tissues and calvarium demonstrate no acute finding. Sinuses/Orbits: Globes orbital soft tissues demonstrate no acute finding. Paranasal sinuses and mastoid air cells are largely clear. Other: None. Review of the MIP images confirms the above findings CTA NECK FINDINGS Aortic arch: Aortic arch normal caliber with standard branching pattern. Moderate atheromatous change about the arch itself. No high-grade stenosis about the origin the great vessels. Right carotid system: Right CCA patent from its origin to the bifurcation without significant stenosis. Bulky calcified plaque about the right carotid bulb/proximal right ICA with associated stenosis of up to 70% by NASCET criteria. Left ICA patent distally without stenosis or dissection. Left carotid system: Scattered atheromatous change throughout the left CCA without significant stenosis. Mixed plaque about the left carotid bulb/proximal left ICA without hemodynamically significant greater than 50% stenosis. Additional mixed plaque about the distal cervical left ICA just prior to the skull base with associated 50% stenosis by NASCET criteria (series 10, image 141). Vertebral arteries: Left vertebral artery arises  directly from the aortic arch. Extensive atheromatous change at the origin of the right subclavian artery with associated severe 85% stenosis (series 10, image 326). Right vertebral artery hypoplastic and widely patent within the neck without stenosis or dissection. Left vertebral artery patent at its origin, but occludes at the mid-distal left V1 segment (series 10, image 300). This is age indeterminate, but could be acute in nature. Distal reconstitution at the left V2 segment via  cervical collaterals. Left vertebral otherwise attenuated but patent distally to the skull base. Skeleton: No discrete or worrisome osseous lesions. Multilevel degenerative spondylosis without high-grade spinal stenosis. Other neck: No other acute soft tissue abnormality within the neck. Upper chest: Visualized upper chest demonstrates no acute finding. Review of the MIP images confirms the above findings CTA HEAD FINDINGS Anterior circulation: Petrous segments patent bilaterally. Atheromatous change throughout the carotid siphons with associated moderate multifocal narrowing. A1 segments patent bilaterally. Normal anterior communicating artery complex. Anterior cerebral arteries patent without stenosis. Both M1 segments patent without stenosis or occlusion. No proximal MCA branch occlusion. Distal MCA branches perfused and symmetric. Posterior circulation: Dominant left vertebral artery attenuated but patent as it courses into the cranial vault. Extensive atheromatous change within the left V4 segment with focal severe near occlusive stenosis just prior to the vertebrobasilar junction (series 10, image 139). Left PICA remains patent. Right V4 segment diminutive and patent to the takeoff of the right PICA without significant stenosis. Right PICA patent. Additional severe stenoses noted involving the distal right V4 segment prior to the vertebrobasilar junction (series 10, images 135, 131). Multifocal atheromatous irregularity seen throughout the basilar artery with associated mild to moderate multifocal narrowing. Superior cerebellar arteries patent bilaterally. Both PCA supplied via the basilar as well as prominent bilateral posterior communicating arteries. PCAs widely patent to their distal aspects without stenosis. Venous sinuses: Patent allowing for timing the contrast bolus. Anatomic variants: As above.  No aneurysm. Review of the MIP images confirms the above findings IMPRESSION: CT HEAD IMPRESSION: 1.  Previously identified cerebellar infarct not well visualized by CT. No intracranial hemorrhage. No other acute intracranial abnormality. 2. Underlying atrophy with chronic small vessel ischemic disease. CTA HEAD AND NECK IMPRESSION: 1. Occlusion of the dominant mid-distal left V1 segment, age indeterminate, but could be acute in nature. Distal reconstitution at the left V2 segment via cervical collaterals. Additional extensive atheromatous change about the intracranial V4 segments with severe multifocal stenoses as above. 2. Severe 85% stenosis at the origin of the right subclavian artery. 3. Short-segment 50% stenosis about the distal cervical left ICA. 4. 70% atheromatous stenosis about the right carotid bulb. 5. Extensive atheromatous disease throughout the carotid siphons with associated moderate multifocal stenoses. Electronically Signed   By: Jeannine Boga M.D.   On: 01/09/2022 01:41   MR BRAIN WO CONTRAST  Result Date: 01/08/2022 CLINICAL DATA:  Initial evaluation for acute dizziness. EXAM: MRI HEAD WITHOUT CONTRAST TECHNIQUE: Multiplanar, multiecho pulse sequences of the brain and surrounding structures were obtained without intravenous contrast. COMPARISON:  None Available. FINDINGS: Brain: Age-related cerebral atrophy. Patchy T2/FLAIR hyperintensity within the periventricular in deep white matter both cerebral hemispheres, most consistent with chronic small vessel ischemic disease, mild for age. 1.7 cm linear focus of restricted diffusion seen involving the central cerebellum, consistent with a small acute to early subacute ischemic infarct (series 5, image 16). No associated hemorrhage or mass effect. No other evidence for acute or subacute ischemia. Gray-white matter differentiation otherwise maintained. No other areas of chronic cortical infarction. No  acute intracranial hemorrhage. Single punctate chronic microhemorrhage noted at the right frontal corona radiata, of doubtful significance in  isolation. No mass lesion, midline shift or mass effect. No hydrocephalus or extra fluid collection. Pituitary gland suprasellar region within normal limits. Vascular: Major intracranial vascular flow voids are grossly maintained. Skull and upper cervical spine: Craniocervical junction within normal limits. Bone marrow signal intensity normal. Sinuses/Orbits: Patient status post bilateral ocular lens replacement. Paranasal sinuses are largely clear. No significant mastoid effusion. Other: None. IMPRESSION: 1. 1.7 cm acute to early subacute ischemic nonhemorrhagic central cerebellar infarct. 2. Age-related cerebral atrophy with mild chronic small vessel ischemic disease. Electronically Signed   By: Jeannine Boga M.D.   On: 01/08/2022 20:28        Scheduled Meds:  [START ON 01/10/2022]  stroke: early stages of recovery book   Does not apply Once   allopurinol  200 mg Oral Daily   amLODipine  5 mg Oral Daily   aspirin EC  81 mg Oral Daily   clopidogrel  75 mg Oral Daily   donepezil  5 mg Oral QHS   [START ON 01/10/2022] heparin  5,000 Units Subcutaneous Q8H   pantoprazole  40 mg Oral Daily   Continuous Infusions:  Assessment & Plan:   Principal Problem:   CVA (cerebral vascular accident) ([place]) Active Problems:   GERD (gastroesophageal reflux disease)   OSA on CPAP   Essential hypertension   History of malignant neoplasm of larynx   Prediabetes   CVA-likely ischemic stroke -MRI-subacute cerebellar cva 9/4 neurology consulted Continue telemetry Echo with normal EF PT OT Frequent neurochecks if clinically changes get stat CT Neurologist input was appreciated recommend aspirin 81 mg plus Plavix 75 mg for 3 weeks followed by aspirin only LDL less than 70, patient is a 38 Vascular surgery consulted for severe right subclavian artery stenosis Permissive hypertension already day 3 from last known well, can start normalizing blood pressure and no need for permissive hypertension at  this time. Follow-up with neurology after discharge in 4 to 6 weeks    HTN Start amlodipine 5 mg daily  hydralazine IVas needed      GERD Continue PPI    OSA  On CPAP nightly at home, resume     Gout No active issues    Malignant neoplasm of larynx  -s/p xrt    Pre-diabetes A1c 5.6  BG stable     DVT prophylaxis: Heparin subcu Code Status: Full Family Communication: Son at bedside Disposition Plan: TOT Status is: Inpatient Remains inpatient appropriate because: had stroke, w/u pending        LOS: 0 days   Time spent: 35 min    Nolberto Hanlon, MD [facility] Pager [phone]  If 7PM-7AM, please contact night-coverage 01/09/2022, 2:53 PM

## 2022-01-09 NOTE — Progress Notes (Signed)
*  PRELIMINARY RESULTS* Echocardiogram 2D Echocardiogram has been performed. Definity IV ultrasound imaging agent used on this study.  Claretta Fraise 01/09/2022, 9:15 AM

## 2022-01-09 NOTE — Progress Notes (Signed)
SLP Cancellation Note  Patient Details Name: [name] MRN: [id number] DOB: 07/19/1931   Cancelled treatment:       Reason Eval/Treat Not Completed: SLP screened, no needs identified, will sign off (chart reviewed; consulted NSG then met w/ pt) Pt A/O x4 sitting on EOB eating his breakfast meal. Pt denied any difficulty swallowing and is currently on a regular diet; tolerates swallowing pills w/ water per NSG. Pt conversed in general conversation w/out expressive/receptive deficits noted; pt denied any speech-language deficits. Speech intelligible - he stated he "enjoyed having somebody come by that he could talk to".  No further skilled ST services indicated as pt appears at his baseline. Pt agreed. NSG to reconsult if any change in status while admitted.         , MS, CCC-SLP Speech Language Pathologist Rehab Services; [facility] [phone] (ascom) , 01/09/2022, 9:20 AM 

## 2022-01-09 NOTE — Consult Note (Signed)
Neurology Consultation  Reason for Consult: Stroke Referring Physician: Dr. Kurtis Bushman  CC: Dizziness, off-balance, stroke on MRI  History is obtained from: Patient, chart  HPI: [name] is a 88 y.o. male past medical history of bilateral asymptomatic carotid stenosis, hypertension, sleep apnea on CPAP, history of malignant laryngeal neoplasm status post XRT, presented to the emergency room via EMS with complaints of dizziness and lightheadedness.  Initial reports of changing blood pressure medications on Thursday, 01/05/2022 due to persistent hypertension and noted that since Friday had been somewhat lightheaded and weak at times.  No constitutional symptoms.  Reported feeling off balance and having difficulty walking when he attempted to walk Saturday morning, which is out of character for him, which is what brought him to the emergency room. His basic lab work was reassuring in the emergency department and without a clear answer for the sudden change, the ED provider and got an MRI of the brain which showed a small acute to early subacute nonhemorrhagic central cerebellar infarct.  He was admitted for further work-up.  Takes aspirin daily.  He is currently not on any cholesterol-lowering medications.   LKW: Possibly the night of 01/06/2022 IV thrombolysis given?: no, outside the window Premorbid modified Rankin scale (mRS): 0   ROS: Full ROS was performed and is negative except as noted in the HPI.   Past Medical History:  Diagnosis Date   Actinic keratosis    Carotid stenosis, asymptomatic, bilateral 09/16/2016   Diffuse 1-39% bilaterally, but involving ICA, ECA, CCA - rpt 1 yr (10/2834)   Complication of anesthesia    slow to wake up after a colonoscopy years ago   Diverticulitis    GERD (gastroesophageal reflux disease)    Gout    History of osteomyelitis 10/2015   left foot   History of squamous cell carcinoma of skin    HTN (hypertension)    OSA on CPAP 09/01/2016    Osteoarthritis    back, neck, knees - multiple joints   Personal history of malignant neoplasm of larynx 2009   s/p XRT, Skin Cancer- "pre cancerous"   Pre-diabetes    Seasonal allergies    Sensorineural hearing loss (SNHL) of both ears 09/01/2016   Squamous cell carcinoma of skin unknown   Treated in [place] - SCC of the L post auricular      Family History  Problem Relation Age of Onset   Hypertension Mother    Hypertension Father    Cancer Other        unknown - niece   CAD Sister 60       stent (80% blockage)   CAD Brother 48       stent (100% blockage)     Social History:   reports that he has never smoked. He has never used smokeless tobacco. He reports current alcohol use. He reports that he does not use drugs.  Medications  Current Facility-Administered Medications:    [START ON 01/10/2022]  stroke: early stages of recovery book, , Does not apply, Once, Clance Boll, MD   acetaminophen (TYLENOL) tablet 650 mg, 650 mg, Oral, Q4H PRN **OR** acetaminophen (TYLENOL) 160 MG/5ML solution 650 mg, 650 mg, Per Tube, Q4H PRN **OR** acetaminophen (TYLENOL) suppository 650 mg, 650 mg, Rectal, Q4H PRN, Clance Boll, MD   allopurinol (ZYLOPRIM) tablet 200 mg, 200 mg, Oral, Daily, Myles Rosenthal A, MD   aspirin EC tablet 81 mg, 81 mg, Oral, Daily, Clance Boll, MD   clopidogrel (  PLAVIX) tablet 75 mg, 75 mg, Oral, Daily, Myles Rosenthal A, MD, 75 mg at 01/09/22 0049   donepezil (ARICEPT) tablet 5 mg, 5 mg, Oral, QHS, Myles Rosenthal A, MD, 5 mg at 01/09/22 0049   [START ON 01/10/2022] heparin injection 5,000 Units, 5,000 Units, Subcutaneous, Q8H, Myles Rosenthal A, MD   pantoprazole (PROTONIX) EC tablet 40 mg, 40 mg, Oral, Daily, Clance Boll, MD   senna-docusate (Senokot-S) tablet 1 tablet, 1 tablet, Oral, QHS PRN, Clance Boll, MD  Current Outpatient Medications:    acetaminophen (TYLENOL) 500 MG tablet, Take 1-2 tablets (500-1,000 mg  total) by mouth 2 (two) times daily as needed for moderate pain. (Patient taking differently: Take 500-1,000 mg by mouth 2 (two) times daily as needed for moderate pain. Pt states taking 3 tablets), Disp: , Rfl:    allopurinol (ZYLOPRIM) 100 MG tablet, TAKE 2 TABLETS(200 MG) BY MOUTH DAILY, Disp: 180 tablet, Rfl: 1   amLODipine (NORVASC) 10 MG tablet, Take 1 tablet (10 mg total) by mouth daily., Disp: 90 tablet, Rfl: 3   aspirin EC 81 MG tablet, Take 81 mg by mouth daily., Disp: , Rfl:    azelastine (ASTELIN) 0.1 % nasal spray, Place 1-2 sprays into both nostrils 2 (two) times daily. Use in each nostril as directed, Disp: 30 mL, Rfl: 6   cetirizine (ZYRTEC) 10 MG tablet, Take 10 mg by mouth daily. Alternates weekly with allegra., Disp: , Rfl:    donepezil (ARICEPT) 5 MG tablet, Take 1 tablet (5 mg total) by mouth at bedtime., Disp: 30 tablet, Rfl: 6   esomeprazole (NEXIUM) 20 MG capsule, TAKE ONE CAPSULE BY MOUTH DAILY AT 12:00 PM, Disp: 90 capsule, Rfl: 3   fexofenadine (ALLEGRA) 180 MG tablet, Take 180 mg by mouth daily. Alternates weekly with zyrtec., Disp: , Rfl:    GLUCOSAMINE-CHONDROITIN PO, Take 2 tablets by mouth daily., Disp: , Rfl:    hydrALAZINE (APRESOLINE) 25 MG tablet, Take 1 tablet (25 mg total) by mouth 3 (three) times daily., Disp: 90 tablet, Rfl: 3   Magnesium 250 MG TABS, Take 1 tablet (250 mg total) by mouth daily., Disp: , Rfl: 0   Multiple Vitamins-Minerals (MULTIVITAMIN ADULT PO), Take 1 tablet by mouth daily., Disp: , Rfl:    polyethylene glycol (MIRALAX / GLYCOLAX) packet, Take 8.5 g by mouth daily. , Disp: , Rfl:    Potassium 99 MG TABS, Take 99 mg by mouth daily., Disp: , Rfl:    carboxymethylcellulose (REFRESH TEARS) 0.5 % SOLN, Place 1 drop into both eyes daily as needed (dry eyes)., Disp: , Rfl:    losartan (COZAAR) 100 MG tablet, TAKE 1 TABLET(100 MG) BY MOUTH DAILY (Patient not taking: Reported on 01/08/2022), Disp: 90 tablet, Rfl: 3   naproxen sodium (ANAPROX) 275 MG  tablet, Take 1 tablet (275 mg total) by mouth 2 (two) times daily with a meal. (Patient not taking: Reported on 01/08/2022), Disp: 20 tablet, Rfl: 0   Exam: Current vital signs: BP (!) 158/70   Pulse 65   Temp 98.1 F (36.7 C) (Oral)   Resp 15   Ht 5' 7.75" (1.721 m)   Wt 99.8 kg   SpO2 94%   BMI 33.70 kg/m  Vital signs in last 24 hours: Temp:  [97.7 F (36.5 C)-98.7 F (37.1 C)] 98.1 F (36.7 C) (09/04 0400) Pulse Rate:  [47-65] 65 (09/04 0723) Resp:  [15-20] 15 (09/04 0723) BP: (136-178)/(60-97) 158/70 (09/04 0723) SpO2:  [94 %-98 %] 94 % (09/04 0723)  Weight:  [99.8 kg] 99.8 kg (09/03 1359) General: Awake alert in no distress HEENT: Normocephalic atraumatic Lungs: Clear Cardiovascular: Regular rhythm Abdomen nondistended nontender Extremities: Left toe amputations Neurological exam Awake alert oriented x3 No dysarthria No aphasia Cranial nerves II to XII intact Motor examination with no drift in any of the 4 extremities Sensation intact to light touch all over without extinction Coordination exam: No dysmetria on finger-nose-finger testing but reported gait ataxia on walking.  NIHSS-0  Labs I have reviewed labs in epic and the results pertinent to this consultation are:  CBC    Component Value Date/Time   WBC 6.4 01/09/2022 0111   RBC 3.77 (L) 01/09/2022 0111   HGB 12.0 (L) 01/09/2022 0111   HCT 36.0 (L) 01/09/2022 0111   PLT 221 01/09/2022 0111   MCV 95.5 01/09/2022 0111   MCH 31.8 01/09/2022 0111   MCHC 33.3 01/09/2022 0111   RDW 13.5 01/09/2022 0111   LYMPHSABS 1.7 12/26/2021 1402   MONOABS 0.4 12/26/2021 1402   EOSABS 0.1 12/26/2021 1402   BASOSABS 0.0 12/26/2021 1402    CMP     Component Value Date/Time   NA 136 01/08/2022 1407   K 4.6 01/08/2022 1407   CL 100 01/08/2022 1407   CO2 25 01/08/2022 1407   GLUCOSE 117 (H) 01/08/2022 1407   BUN 21 01/08/2022 1407   CREATININE 1.05 01/09/2022 0111   CALCIUM 8.8 (L) 01/08/2022 1407   PROT 6.5  12/26/2021 1402   ALBUMIN 3.9 12/26/2021 1402   AST 22 12/26/2021 1402   ALT 18 12/26/2021 1402   ALKPHOS 48 12/26/2021 1402   BILITOT 0.4 12/26/2021 1402   GFRNONAA >60 01/09/2022 0111   GFRAA >60 05/16/2019 0703    Lipid Panel     Component Value Date/Time   CHOL 136 01/09/2022 0111   TRIG 138 01/09/2022 0111   HDL 41 01/09/2022 0111   CHOLHDL 3.3 01/09/2022 0111   VLDL 28 01/09/2022 0111   LDLCALC 67 01/09/2022 0111   LDLDIRECT 76.0 02/05/2019 0752   LDL 67 A1c 5.6  2D echo: Completed, results pending  Imaging I have reviewed the images obtained:  CT-head: No acute changes. CT angiography head and neck with occlusion of the dominant mid distal left V1 segment age-indeterminate but could be acute.  Distal reconstitution at the left P2 segment via cervical collaterals.  Additional extensive atheromatous change about the intracranial V4 segment with severe multifocal stenosis. Severe-85% stenosis at the origin of the right subclavian artery.  Right vertebral artery coming off of the aorta directly. Short segment 50% stenosis of the distal cervical left ICA 70% atheromatous stenosis about the right carotid bulb. Extensive atheromatous disease throughout the carotid siphons with associated moderate multifocal stenoses seen.  MRI examination of the brain: 1.7 cm central cerebellar infarct-acute to subacute.  Assessment: 88 year old man, with above past medical history presenting for couple of days of dizziness and being off balance noted to have a cerebellar infarct. His angiography of the head and neck shows extensive calcifications in both anterior and posterior circulation-more specifically with severe 85% stenosis at the origin of the right subclavian, as well as occlusion of the dominant mid distal left V1 segment of the left vertebral artery amongst others as detailed above. Etiology of the stroke is small vessel etiology versus atheroembolic.  Impression: Acute ischemic  stroke  Recommendations: -Telemetry -Frequent neurochecks -PT OT speech therapy -2D echo-results pending -Aspirin 81+ Plavix 75 for 3 weeks followed by aspirin only. -No need  for statin-LDL at goal. -Vascular surgery consultation for the severe right subclavian artery stenosis-although the right vertebral artery originates directly from the aorta and I do not think the subclavian stenosis is causing the stroke, it is worth getting vascular surgery input for any need for revascularization. -Permissive hypertension-patient is already day 3 from last known well, can start normalizing blood pressures and no need for permissive hypertension at this time.  Goal blood pressure outpatient should be normotension. -Neurology follow-up in 4 to 6 weeks after discharge.  Plan relayed to Dr. Kurtis Bushman  -- Amie Portland, MD Neurologist [facility] Pager: [phone]

## 2022-01-09 NOTE — Consult Note (Signed)
[facility] [place]  Patient name: [name] MRN: [id number] DOB: 1932/03/14 Sex: male   REQUESTING PROVIDER:   ER   REASON FOR CONSULT:    Cerebellar stroke  HISTORY OF PRESENT ILLNESS:   [name] is a 88 y.o. male, who presented to the emergency department with complaints of dizziness and loss of balance.  He denied any focal symptoms or visual disturbances.  MRI of the Paul revealed a subacute cerebellar infarct with chronic small vessel disease.  CTA revealed occlusion of the vertebral V1 segment with distal reconstitution.  There was a 85% stenosis at the origin of the right subclavian artery, 50% stenosis of the left carotid and 70% stenosis of the right carotid with intracranial disease.  The patient's symptoms have resolved.  The patient is medically managed for hypertension.  His LDL is 67.  He uses a CPAP for sleep apnea.  He has a history of radiation therapy for neoplasm of the larynx.  He has prediabetes.  He is a non-smoker.  PAST MEDICAL HISTORY    Past Medical History:  Diagnosis Date   Actinic keratosis    Carotid stenosis, asymptomatic, bilateral 09/16/2016   Diffuse 1-39% bilaterally, but involving ICA, ECA, CCA - rpt 1 yr (06/1192)   Complication of anesthesia    slow to wake up after a colonoscopy years ago   Diverticulitis    GERD (gastroesophageal reflux disease)    Gout    History of osteomyelitis 10/2015   left foot   History of squamous cell carcinoma of skin    HTN (hypertension)    OSA on CPAP 09/01/2016   Osteoarthritis    back, neck, knees - multiple joints   Personal history of malignant neoplasm of larynx 2009   s/p XRT, Skin Cancer- "pre cancerous"   Pre-diabetes    Seasonal allergies    Sensorineural hearing loss (SNHL) of both ears 09/01/2016   Squamous cell carcinoma of skin unknown   Treated in [place] - SCC of the L post auricular      FAMILY HISTORY    Family History  Problem Relation Age of Onset   Hypertension Mother    Hypertension Father    Cancer Other        unknown - niece   CAD Sister 73       stent (80% blockage)   CAD Brother 21       stent (100% blockage)    SOCIAL HISTORY:   Social History   Socioeconomic History   Marital status: Married    Spouse name: Not on file   Number of children: Not on file   Years of education: Not on file   Highest education level: Bachelor's degree (e.g., BA, AB, BS)  Occupational History   Not on file  Tobacco Use   Smoking status: Never   Smokeless tobacco: Never  Vaping Use   Vaping Use: Never used  Substance and Sexual Activity   Alcohol use: Yes    Comment: occasional wine   Drug use: No   Sexual activity: Not on file  Other Topics Concern   Not on file  Social History Narrative   "[name]"   Widower. Second marriage - step-father of Kathrynn Running   Occ: retired [place]   Edu: college chemistry degree   Right handed   Caffeine: Drinks 1 cup of coffee every morning    Social Determinants of Radio broadcast assistant Strain:  Low Risk  (02/11/2020)   Overall Financial Resource Strain (CARDIA)    Difficulty of Paying Living Expenses: Not hard at all  Food Insecurity: No Food Insecurity (02/11/2020)   Hunger Vital Sign    Worried About Running Out of Food in the Last Year: Never true    Ran Out of Food in the Last Year: Never true  Transportation Needs: No Transportation Needs (02/11/2020)   PRAPARE - Hydrologist (Medical): No    Lack of Transportation (Non-Medical): No  Physical Activity: Sufficiently Active (02/11/2020)   Exercise Vital Sign    Days of Exercise per Week: 7 days    Minutes of Exercise per Session: 30 min  Stress: No Stress Concern Present (02/11/2020)   [place]    Feeling of Stress : Not at all  Social Connections: Not on file   Intimate Partner Violence: Not At Risk (02/11/2020)   Humiliation, Afraid, Rape, and Kick questionnaire    Fear of Current or Ex-Partner: No    Emotionally Abused: No    Physically Abused: No    Sexually Abused: No    ALLERGIES:    Allergies  Allergen Reactions   Lisinopril Cough    CURRENT MEDICATIONS:    Current Facility-Administered Medications  Medication Dose Route Frequency Provider Last Rate Last Admin   [START ON 01/10/2022]  stroke: early stages of recovery book   Does not apply Once Clance Boll, MD       acetaminophen (TYLENOL) tablet 650 mg  650 mg Oral Q4H PRN Clance Boll, MD       Or   acetaminophen (TYLENOL) 160 MG/5ML solution 650 mg  650 mg Per Tube Q4H PRN Clance Boll, MD       Or   acetaminophen (TYLENOL) suppository 650 mg  650 mg Rectal Q4H PRN Clance Boll, MD       allopurinol (ZYLOPRIM) tablet 200 mg  200 mg Oral Daily Myles Rosenthal A, MD   200 mg at 01/09/22 0927   amLODipine (NORVASC) tablet 5 mg  5 mg Oral Daily Nolberto Hanlon, MD   5 mg at 01/09/22 1721   aspirin EC tablet 81 mg  81 mg Oral Daily Clance Boll, MD   81 mg at 01/09/22 5462   clopidogrel (PLAVIX) tablet 75 mg  75 mg Oral Daily Myles Rosenthal A, MD   75 mg at 01/09/22 0927   donepezil (ARICEPT) tablet 5 mg  5 mg Oral QHS Myles Rosenthal A, MD   5 mg at 01/09/22 0049   [START ON 01/10/2022] heparin injection 5,000 Units  5,000 Units Subcutaneous Q8H Clance Boll, MD       pantoprazole (PROTONIX) EC tablet 40 mg  40 mg Oral Daily Clance Boll, MD   40 mg at 01/09/22 7035   senna-docusate (Senokot-S) tablet 1 tablet  1 tablet Oral QHS PRN Clance Boll, MD        REVIEW OF SYSTEMS:   '[X]'$  denotes positive finding, '[ ]'$  denotes negative finding Cardiac  Comments:  Chest pain or chest pressure:    Shortness of breath upon exertion:    Short of breath when lying flat:    Irregular heart rhythm:        Vascular    Pain in calf,  thigh, or hip brought on by ambulation:    Pain in feet at night that wakes you up from your  sleep:     Blood clot in your veins:    Leg swelling:         Pulmonary    Oxygen at home:    Productive cough:     Wheezing:         Neurologic    Sudden weakness in arms or legs:     Sudden numbness in arms or legs:     Sudden onset of difficulty speaking or slurred speech:    Temporary loss of vision in one eye:     Problems with dizziness:  x       Gastrointestinal    Blood in stool:      Vomited blood:         Genitourinary    Burning when urinating:     Blood in urine:        Psychiatric    Major depression:         Hematologic    Bleeding problems:    Problems with blood clotting too easily:        Skin    Rashes or ulcers:        Constitutional    Fever or chills:     PHYSICAL EXAM:   Vitals:   01/09/22 1045 01/09/22 1314 01/09/22 1343 01/09/22 1634  BP: (!) 148/68  131/61 (!) 159/71  Pulse: (!) 57 64 (!) 58 63  Resp: '14 15 17 18  '$ Temp:    98 F (36.7 C)  TempSrc:    Oral  SpO2: 96% 97% 96% 99%  Weight:    99.7 kg  Height:    5' 9.75" (1.772 m)    GENERAL: The patient is a well-nourished male, in no acute distress. The vital signs are documented above. CARDIAC: There is a regular rate and rhythm.  VASCULAR: Palpable radial pulses bilaterally. PULMONARY: Nonlabored respirations ABDOMEN: Soft and non-tender  MUSCULOSKELETAL: There are no major deformities or cyanosis. NEUROLOGIC: No focal weakness or paresthesias are detected. SKIN: There are no ulcers or rashes noted. PSYCHIATRIC: The patient has a normal affect.  STUDIES:   I have reviewed the following studies:  MRI Paul: 1. 1.7 cm acute to early subacute ischemic nonhemorrhagic central cerebellar infarct. 2. Age-related cerebral atrophy with mild chronic small vessel ischemic disease.  CTA: CT HEAD IMPRESSION:   1. Previously identified cerebellar infarct not well visualized by CT. No  intracranial hemorrhage. No other acute intracranial abnormality. 2. Underlying atrophy with chronic small vessel ischemic disease.   CTA HEAD AND NECK IMPRESSION:   1. Occlusion of the dominant mid-distal left V1 segment, age indeterminate, but could be acute in nature. Distal reconstitution at the left V2 segment via cervical collaterals. Additional extensive atheromatous change about the intracranial V4 segments with severe multifocal stenoses as above. 2. Severe 85% stenosis at the origin of the right subclavian artery. 3. Short-segment 50% stenosis about the distal cervical left ICA. 4. 70% atheromatous stenosis about the right carotid bulb. 5. Extensive atheromatous disease throughout the carotid siphons with associated moderate multifocal stenoses.  ASSESSMENT and PLAN   Cerebellar stroke: This is either secondary to his left vertebral artery short segment occlusion or intracranial disease.  I discussed with the patient and family that there is no benefit to trying to recanalize his vertebral occlusion.  Especially since he is currently asymptomatic.  This should be treated medically with aspirin and Plavix.  I agree with cardiac work-up to exclude arrhythmia as a possible embolic source for his vertebral artery  occlusion.  Carotid: The patient has bilateral carotid stenosis.  The right side is 70%, the left is 50%.  He is asymptomatic and therefore I would not recommend intervention unless he becomes greater than 80%  Right subclavian stenosis: By CT scan, this is 85% at the origin.  I do not feel this is contributing to his current symptoms.  He does not have signs of vertebrobasilar insufficiency and does have a palpable right radial pulse.  No intervention is recommended.   Leia Alf, MD, FACS [facility] [phone] Pager [phone]

## 2022-01-10 DIAGNOSIS — I10 Essential (primary) hypertension: Secondary | ICD-10-CM | POA: Diagnosis not present

## 2022-01-10 DIAGNOSIS — I639 Cerebral infarction, unspecified: Secondary | ICD-10-CM | POA: Diagnosis not present

## 2022-01-10 DIAGNOSIS — G4733 Obstructive sleep apnea (adult) (pediatric): Secondary | ICD-10-CM | POA: Diagnosis not present

## 2022-01-10 DIAGNOSIS — Z8521 Personal history of malignant neoplasm of larynx: Secondary | ICD-10-CM | POA: Diagnosis not present

## 2022-01-10 DIAGNOSIS — K219 Gastro-esophageal reflux disease without esophagitis: Secondary | ICD-10-CM | POA: Diagnosis not present

## 2022-01-10 MED ORDER — CLOPIDOGREL BISULFATE 75 MG PO TABS
75.0000 mg | ORAL_TABLET | Freq: Every day | ORAL | 0 refills | Status: DC
Start: 2022-01-11 — End: 2022-02-10

## 2022-01-10 NOTE — Care Management Obs Status (Signed)
[facility] NOTIFICATION   Patient Details  Name: TREYVEN LAFAUCI MRN: [id number] Date of Birth: Dec 08, 1931   Medicare Observation Status Notification Given:  Yes    Alberteen Sam, LCSW 01/10/2022, 12:24 PM

## 2022-01-10 NOTE — Discharge Summary (Signed)
[name] NLZ:[id number] DOB: 1932/03/11 DOA: 01/08/2022  PCP: Ria Bush, MD  Admit date: 01/08/2022 Discharge date: 01/10/2022  Admitted From: home Disposition:  home  Recommendations for Outpatient Follow-up:  Follow up with PCP in 1 week Please obtain BMP/CBC in one week Please follow up with neurology in one month     Discharge Condition:Stable CODE STATUS:full  Diet recommendation: Heart Healthy  Brief/Interim Summary: Per TKW:IOXBDZH L [name] is a 88 y.o. male with medical history significant of   b/l  low grade carotid A stenosis , HTN, HLD,GERD,  OSA on cpap,Gout, malignant neoplasm of larynx s/p xrt, Pre-diabetes who presents to the ED with complaints of being off balance and dizzy . Found with Acute to early subacute ischemic nonhemorrhagic infarct. Vascular and neurology consulted due to imaging findings , see report below.  CVA-likely ischemic stroke -MRI-subacute cerebellar cva neurology consulted Continue telemetry Echo with normal EF PT OT- no needs ASA + Plavix for 3 months followed by plavix only No need for statin - LDL Within goal without statin at home. Appreciate vascular recs - no surgical intervention needed at this time. Follow-up with outpatient neurology in 4 to 6 weeks he is is an established patient of [name]  Avoid nsaids due to risk of GIB since on asa /plavix combo.  Per Vascular: The patient has bilateral carotid stenosis.  The right side is 70%, the left is 50%.  He is asymptomatic and therefore I would not recommend intervention unless he becomes greater than 80% . Right subclavian stenosis: By CT scan, this is 85% at the origin.  I do not feel this is contributing to his current symptoms.  He does not have signs of vertebrobasilar insufficiency and does have a palpable right radial pulse.  No intervention is recommended.     HTN Stable.  Continue medications.  hold losartan until see pcp to make sure bp  doesn't drop too low     GERD Continue PPI    OSA  On CPAP nightly at home, resume     Gout No active issues    Malignant neoplasm of larynx  -s/p xrt    Pre-diabetes A1c 5.6         Discharge Diagnoses:  Principal Problem:   CVA (cerebral vascular accident) ([facility]) Active Problems:   GERD (gastroesophageal reflux disease)   OSA on CPAP   Essential hypertension   History of malignant neoplasm of larynx   Prediabetes    Discharge Instructions  Discharge Instructions     Diet - low sodium heart healthy   Complete by: As directed    Diet - low sodium heart healthy   Complete by: As directed    Discharge instructions   Complete by: As directed    follow-up with your neurologist in 1 month Take aspirin and Plavix for 3 months followed by plavix only Do not take any ibuprofen or NSAIDs as it causes risk of bleeding   Increase activity slowly   Complete by: As directed    Increase activity slowly   Complete by: As directed       Allergies as of 01/10/2022       Reactions   Lisinopril Cough        Medication List     STOP taking these medications    losartan 100 MG tablet Commonly known as: COZAAR   naproxen sodium 275 MG tablet Commonly known as: ANAPROX   Potassium 99 MG Tabs  TAKE these medications    acetaminophen 500 MG tablet Commonly known as: TYLENOL Take 1-2 tablets (500-1,000 mg total) by mouth 2 (two) times daily as needed for moderate pain. What changed: additional instructions   allopurinol 100 MG tablet Commonly known as: ZYLOPRIM TAKE 2 TABLETS(200 MG) BY MOUTH DAILY   amLODipine 10 MG tablet Commonly known as: NORVASC Take 1 tablet (10 mg total) by mouth daily.   aspirin EC 81 MG tablet Take 81 mg by mouth daily.   azelastine 0.1 % nasal spray Commonly known as: ASTELIN Place 1-2 sprays into both nostrils 2 (two) times daily. Use in each nostril as directed   cetirizine 10 MG tablet Commonly known as:  ZYRTEC Take 10 mg by mouth daily. Alternates weekly with allegra.   clopidogrel 75 MG tablet Commonly known as: PLAVIX Take 1 tablet (75 mg total) by mouth daily. Start taking on: January 11, 2022   donepezil 5 MG tablet Commonly known as: ARICEPT Take 1 tablet (5 mg total) by mouth at bedtime.   esomeprazole 20 MG capsule Commonly known as: NEXIUM TAKE ONE CAPSULE BY MOUTH DAILY AT 12:00 PM   fexofenadine 180 MG tablet Commonly known as: ALLEGRA Take 180 mg by mouth daily. Alternates weekly with zyrtec.   GLUCOSAMINE-CHONDROITIN PO Take 2 tablets by mouth daily.   hydrALAZINE 25 MG tablet Commonly known as: APRESOLINE Take 1 tablet (25 mg total) by mouth 3 (three) times daily.   Magnesium 250 MG Tabs Take 1 tablet (250 mg total) by mouth daily.   MULTIVITAMIN ADULT PO Take 1 tablet by mouth daily.   polyethylene glycol 17 g packet Commonly known as: MIRALAX / GLYCOLAX Take 8.5 g by mouth daily.   Refresh Tears 0.5 % Soln Generic drug: carboxymethylcellulose Place 1 drop into both eyes daily as needed (dry eyes).        Follow-up Information     [name] Follow up in 1 month(s).   Why: hospital f/u Contact information: [address]     [address] [place] [address] [phone]        Ria Bush, MD Follow up in 1 week(s).   Specialty: Family Medicine Contact information: [place] [address] [phone]                Allergies  Allergen Reactions   Lisinopril Cough    Consultations: Neurology and vascular surgery   Procedures/Studies: ECHOCARDIOGRAM COMPLETE  Result Date: 01/09/2022    ECHOCARDIOGRAM REPORT   Patient Name:   [name] Date of Exam: 01/09/2022 Medical Rec #:  [id number]        Height:       67.8 in Accession #:    [id number]       Weight:       220.0 lb Date of Birth:  1931/05/31        BSA:          2.123 m Patient Age:    88 years          BP:           158/70 mmHg Patient Gender: M                HR:           57 bpm. Exam Location:  [facility] Procedure: 2D Echo and Intracardiac Opacification Agent Indications:     Stroke I63.9  History:         Patient  has no prior history of Echocardiogram examinations.  Sonographer:     Kathlen Brunswick RDCS Referring Phys:  [id number] Victoriano Lain A THOMAS Diagnosing Phys: Nelva Bush MD  Sonographer Comments: Technically difficult study due to poor echo windows and suboptimal subcostal window. Image acquisition challenging due to respiratory motion. IMPRESSIONS  1. Left ventricular ejection fraction, by estimation, is 60 to 65%. The left ventricle has normal function. The left ventricle has no regional wall motion abnormalities. There is moderate left ventricular hypertrophy. Left ventricular diastolic parameters are consistent with Grade I diastolic dysfunction (impaired relaxation). Elevated left atrial pressure.  2. Right ventricular systolic function is normal. The right ventricular size is normal. Tricuspid regurgitation signal is inadequate for assessing PA pressure.  3. The mitral valve was not well visualized. Trivial mitral valve regurgitation. No evidence of mitral stenosis.  4. The aortic valve was not well visualized. Aortic valve regurgitation is not visualized. No aortic stenosis is present.  5. Aortic dilatation noted. There is borderline dilatation of the aortic root, measuring 38 mm. FINDINGS  Left Ventricle: Left ventricular ejection fraction, by estimation, is 60 to 65%. The left ventricle has normal function. The left ventricle has no regional wall motion abnormalities. Definity contrast agent was given IV to delineate the left ventricular  endocardial borders. The left ventricular internal cavity size was normal in size. There is moderate left ventricular hypertrophy. Left ventricular diastolic parameters are consistent with Grade I diastolic dysfunction (impaired relaxation). Elevated left atrial  pressure. Right Ventricle: The right ventricular size is normal. No increase in right ventricular wall thickness. Right ventricular systolic function is normal. Tricuspid regurgitation signal is inadequate for assessing PA pressure. Left Atrium: Left atrial size was normal in size. Right Atrium: Right atrial size was normal in size. Pericardium: There is no evidence of pericardial effusion. Presence of epicardial fat layer. Mitral Valve: The mitral valve was not well visualized. Mild mitral annular calcification. Trivial mitral valve regurgitation. No evidence of mitral valve stenosis. Tricuspid Valve: The tricuspid valve is not well visualized. Tricuspid valve regurgitation is not demonstrated. Aortic Valve: The aortic valve was not well visualized. Aortic valve regurgitation is not visualized. No aortic stenosis is present. Aortic valve peak gradient measures 6.6 mmHg. Pulmonic Valve: The pulmonic valve was not well visualized. Aorta: Aortic dilatation noted. There is borderline dilatation of the aortic root, measuring 38 mm. Pulmonary Artery: The pulmonary artery is not well seen. Venous: The inferior vena cava was not well visualized. IAS/Shunts: The interatrial septum was not well visualized.  LEFT VENTRICLE PLAX 2D LVIDd:         5.62 cm      Diastology LVIDs:         3.89 cm      LV e' medial:    5.55 cm/s LV PW:         1.40 cm      LV E/e' medial:  15.4 LV IVS:        1.24 cm      LV e' lateral:   6.53 cm/s LVOT diam:     2.10 cm      LV E/e' lateral: 13.1 LV SV:         90 LV SV Index:   42 LVOT Area:     3.46 cm  LV Volumes (MOD) LV vol d, MOD A2C: 104.0 ml LV vol d, MOD A4C: 113.0 ml LV vol s, MOD A2C: 43.4 ml LV vol s, MOD A4C: 49.9 ml LV SV MOD A2C:  60.6 ml LV SV MOD A4C:     113.0 ml LV SV MOD BP:      63.9 ml RIGHT VENTRICLE RV Basal diam:  3.68 cm RV S prime:     15.10 cm/s TAPSE (M-mode): 2.6 cm LEFT ATRIUM             Index        RIGHT ATRIUM           Index LA diam:        3.50 cm 1.65  cm/m   RA Area:     14.90 cm LA Vol (A2C):   50.4 ml 23.74 ml/m  RA Volume:   35.80 ml  16.86 ml/m LA Vol (A4C):   47.4 ml 22.33 ml/m LA Biplane Vol: 51.0 ml 24.02 ml/m  AORTIC VALVE AV Area (Vmax): 3.08 cm AV Vmax:        128.00 cm/s AV Peak Grad:   6.6 mmHg LVOT Vmax:      114.00 cm/s LVOT Vmean:     79.200 cm/s LVOT VTI:       0.259 m  AORTA Ao Root diam: 3.80 cm MITRAL VALVE MV Area (PHT): 2.48 cm     SHUNTS MV Decel Time: 306 msec     Systemic VTI:  0.26 m MV E velocity: 85.70 cm/s   Systemic Diam: 2.10 cm MV A velocity: 108.00 cm/s MV E/A ratio:  0.79 Harrell Gave End MD Electronically signed by Nelva Bush MD Signature Date/Time: 01/09/2022/11:30:00 AM    Final    CT ANGIO HEAD NECK W WO CM  Result Date: 01/09/2022 CLINICAL DATA:  Follow-up examination for stroke. EXAM: CT ANGIOGRAPHY HEAD AND NECK TECHNIQUE: Multidetector CT imaging of the head and neck was performed using the standard protocol during bolus administration of intravenous contrast. Multiplanar CT image reconstructions and MIPs were obtained to evaluate the vascular anatomy. Carotid stenosis measurements (when applicable) are obtained utilizing NASCET criteria, using the distal internal carotid diameter as the denominator. RADIATION DOSE REDUCTION: This exam was performed according to the departmental dose-optimization program which includes automated exposure control, adjustment of the mA and/or kV according to patient size and/or use of iterative reconstruction technique. CONTRAST:  24m OMNIPAQUE IOHEXOL 350 MG/ML SOLN COMPARISON:  MRI from 01/08/2022. FINDINGS: CT HEAD FINDINGS Brain: Age-related cerebral atrophy with chronic small vessel ischemic disease. Previously identified cerebellar infarct not well visualized by CT. No acute intracranial hemorrhage. No other acute large vessel territory infarct. No mass lesion or midline shift. Mild ventricular prominence related to global parenchymal volume loss of hydrocephalus. No  extra-axial fluid collection. Vascular: No hyperdense vessel. Calcified atherosclerosis present at skull base. Skull: Scalp soft tissues and calvarium demonstrate no acute finding. Sinuses/Orbits: Globes orbital soft tissues demonstrate no acute finding. Paranasal sinuses and mastoid air cells are largely clear. Other: None. Review of the MIP images confirms the above findings CTA NECK FINDINGS Aortic arch: Aortic arch normal caliber with standard branching pattern. Moderate atheromatous change about the arch itself. No high-grade stenosis about the origin the great vessels. Right carotid system: Right CCA patent from its origin to the bifurcation without significant stenosis. Bulky calcified plaque about the right carotid bulb/proximal right ICA with associated stenosis of up to 70% by NASCET criteria. Left ICA patent distally without stenosis or dissection. Left carotid system: Scattered atheromatous change throughout the left CCA without significant stenosis. Mixed plaque about the left carotid bulb/proximal left ICA without hemodynamically significant greater than 50% stenosis. Additional mixed plaque about the  distal cervical left ICA just prior to the skull base with associated 50% stenosis by NASCET criteria (series 10, image 141). Vertebral arteries: Left vertebral artery arises directly from the aortic arch. Extensive atheromatous change at the origin of the right subclavian artery with associated severe 85% stenosis (series 10, image 326). Right vertebral artery hypoplastic and widely patent within the neck without stenosis or dissection. Left vertebral artery patent at its origin, but occludes at the mid-distal left V1 segment (series 10, image 300). This is age indeterminate, but could be acute in nature. Distal reconstitution at the left V2 segment via cervical collaterals. Left vertebral otherwise attenuated but patent distally to the skull base. Skeleton: No discrete or worrisome osseous lesions.  Multilevel degenerative spondylosis without high-grade spinal stenosis. Other neck: No other acute soft tissue abnormality within the neck. Upper chest: Visualized upper chest demonstrates no acute finding. Review of the MIP images confirms the above findings CTA HEAD FINDINGS Anterior circulation: Petrous segments patent bilaterally. Atheromatous change throughout the carotid siphons with associated moderate multifocal narrowing. A1 segments patent bilaterally. Normal anterior communicating artery complex. Anterior cerebral arteries patent without stenosis. Both M1 segments patent without stenosis or occlusion. No proximal MCA branch occlusion. Distal MCA branches perfused and symmetric. Posterior circulation: Dominant left vertebral artery attenuated but patent as it courses into the cranial vault. Extensive atheromatous change within the left V4 segment with focal severe near occlusive stenosis just prior to the vertebrobasilar junction (series 10, image 139). Left PICA remains patent. Right V4 segment diminutive and patent to the takeoff of the right PICA without significant stenosis. Right PICA patent. Additional severe stenoses noted involving the distal right V4 segment prior to the vertebrobasilar junction (series 10, images 135, 131). Multifocal atheromatous irregularity seen throughout the basilar artery with associated mild to moderate multifocal narrowing. Superior cerebellar arteries patent bilaterally. Both PCA supplied via the basilar as well as prominent bilateral posterior communicating arteries. PCAs widely patent to their distal aspects without stenosis. Venous sinuses: Patent allowing for timing the contrast bolus. Anatomic variants: As above.  No aneurysm. Review of the MIP images confirms the above findings IMPRESSION: CT HEAD IMPRESSION: 1. Previously identified cerebellar infarct not well visualized by CT. No intracranial hemorrhage. No other acute intracranial abnormality. 2. Underlying  atrophy with chronic small vessel ischemic disease. CTA HEAD AND NECK IMPRESSION: 1. Occlusion of the dominant mid-distal left V1 segment, age indeterminate, but could be acute in nature. Distal reconstitution at the left V2 segment via cervical collaterals. Additional extensive atheromatous change about the intracranial V4 segments with severe multifocal stenoses as above. 2. Severe 85% stenosis at the origin of the right subclavian artery. 3. Short-segment 50% stenosis about the distal cervical left ICA. 4. 70% atheromatous stenosis about the right carotid bulb. 5. Extensive atheromatous disease throughout the carotid siphons with associated moderate multifocal stenoses. Electronically Signed   By: Jeannine Boga M.D.   On: 01/09/2022 01:41   MR BRAIN WO CONTRAST  Result Date: 01/08/2022 CLINICAL DATA:  Initial evaluation for acute dizziness. EXAM: MRI HEAD WITHOUT CONTRAST TECHNIQUE: Multiplanar, multiecho pulse sequences of the brain and surrounding structures were obtained without intravenous contrast. COMPARISON:  None Available. FINDINGS: Brain: Age-related cerebral atrophy. Patchy T2/FLAIR hyperintensity within the periventricular in deep white matter both cerebral hemispheres, most consistent with chronic small vessel ischemic disease, mild for age. 1.7 cm linear focus of restricted diffusion seen involving the central cerebellum, consistent with a small acute to early subacute ischemic infarct (series 5, image 16).  No associated hemorrhage or mass effect. No other evidence for acute or subacute ischemia. Gray-white matter differentiation otherwise maintained. No other areas of chronic cortical infarction. No acute intracranial hemorrhage. Single punctate chronic microhemorrhage noted at the right frontal corona radiata, of doubtful significance in isolation. No mass lesion, midline shift or mass effect. No hydrocephalus or extra fluid collection. Pituitary gland suprasellar region within normal  limits. Vascular: Major intracranial vascular flow voids are grossly maintained. Skull and upper cervical spine: Craniocervical junction within normal limits. Bone marrow signal intensity normal. Sinuses/Orbits: Patient status post bilateral ocular lens replacement. Paranasal sinuses are largely clear. No significant mastoid effusion. Other: None. IMPRESSION: 1. 1.7 cm acute to early subacute ischemic nonhemorrhagic central cerebellar infarct. 2. Age-related cerebral atrophy with mild chronic small vessel ischemic disease. Electronically Signed   By: Jeannine Boga M.D.   On: 01/08/2022 20:28      Subjective: Feels well.  No weakness or dizziness  Discharge Exam: Vitals:   01/10/22 0427 01/10/22 0720  BP: 136/70 (!) 167/76  Pulse: 65 72  Resp: 20 20  Temp: 98 F (36.7 C) 97.9 F (36.6 C)  SpO2: 94% 98%   Vitals:   01/09/22 2149 01/09/22 2352 01/10/22 0427 01/10/22 0720  BP:  (!) 140/71 136/70 (!) 167/76  Pulse: 74 69 65 72  Resp:  '20 20 20  '$ Temp:  97.9 F (36.6 C) 98 F (36.7 C) 97.9 F (36.6 C)  TempSrc:      SpO2: 93% 95% 94% 98%  Weight:      Height:        General: Pt is alert, awake, not in acute distress Cardiovascular: RRR, S1/S2 +, no rubs, no gallops Respiratory: CTA bilaterally, no wheezing, no rhonchi Abdominal: Soft, NT, ND, bowel sounds + Extremities: no edema, no cyanosis    The results of significant diagnostics from this hospitalization (including imaging, microbiology, ancillary and laboratory) are listed below for reference.     Microbiology: Recent Results (from the past 240 hour(s))  SARS Coronavirus 2 by RT PCR (hospital order, performed in [facility] hospital lab) *cepheid single result test* Anterior Nasal Swab     Status: None   Collection Time: 01/08/22  6:15 PM   Specimen: Anterior Nasal Swab  Result Value Ref Range Status   SARS Coronavirus 2 by RT PCR NEGATIVE NEGATIVE Final    Comment: (NOTE) SARS-CoV-2 target nucleic acids are  NOT DETECTED.  The SARS-CoV-2 RNA is generally detectable in upper and lower respiratory specimens during the acute phase of infection. The lowest concentration of SARS-CoV-2 viral copies this assay can detect is 250 copies / mL. A negative result does not preclude SARS-CoV-2 infection and should not be used as the sole basis for treatment or other patient management decisions.  A negative result may occur with improper specimen collection / handling, submission of specimen other than nasopharyngeal swab, presence of viral mutation(s) within the areas targeted by this assay, and inadequate number of viral copies (<250 copies / mL). A negative result must be combined with clinical observations, patient history, and epidemiological information.  Fact Sheet for Patients:   https://www.patel.info/  Fact Sheet for Healthcare Providers: https://hall.com/  This test is not yet approved or  cleared by the [place] FDA and has been authorized for detection and/or diagnosis of SARS-CoV-2 by FDA under an Emergency Use Authorization (EUA).  This EUA will remain in effect (meaning this test can be used) for the duration of the COVID-19 declaration under Section 564(b)(1)  of the Act, 21 U.S.C. section 360bbb-3(b)(1), unless the authorization is terminated or revoked sooner.  Performed at [facility], [facility], [address] [place], [place] [address]      Labs: BNP (last 3 results) No results for input(s): "BNP" in the last 8760 hours. Basic Metabolic Panel: Recent Labs  Lab 01/08/22 1407 01/09/22 0111  NA 136  --   K 4.6  --   CL 100  --   CO2 25  --   GLUCOSE 117*  --   BUN 21  --   CREATININE 0.96 1.05  CALCIUM 8.8*  --   MG  --  2.3   Liver Function Tests: No results for input(s): "AST", "ALT", "ALKPHOS", "BILITOT", "PROT", "ALBUMIN" in the last 168 hours. No results for input(s): "LIPASE", "AMYLASE" in the last 168  hours. No results for input(s): "AMMONIA" in the last 168 hours. CBC: Recent Labs  Lab 01/08/22 1407 01/09/22 0111  WBC 4.9 6.4  HGB 12.7* 12.0*  HCT 38.9* 36.0*  MCV 97.3 95.5  PLT 236 221   Cardiac Enzymes: No results for input(s): "CKTOTAL", "CKMB", "CKMBINDEX", "TROPONINI" in the last 168 hours. BNP: Invalid input(s): "POCBNP" CBG: No results for input(s): "GLUCAP" in the last 168 hours. D-Dimer No results for input(s): "DDIMER" in the last 72 hours. Hgb A1c Recent Labs    01/09/22 0111  HGBA1C 5.6   Lipid Profile Recent Labs    01/09/22 0111  CHOL 136  HDL 41  LDLCALC 67  TRIG 138  CHOLHDL 3.3   Thyroid function studies No results for input(s): "TSH", "T4TOTAL", "T3FREE", "THYROIDAB" in the last 72 hours.  Invalid input(s): "FREET3" Anemia work up No results for input(s): "VITAMINB12", "FOLATE", "FERRITIN", "TIBC", "IRON", "RETICCTPCT" in the last 72 hours. Urinalysis    Component Value Date/Time   COLORURINE YELLOW (A) 01/08/2022 1357   APPEARANCEUR CLEAR (A) 01/08/2022 1357   LABSPEC 1.013 01/08/2022 1357   PHURINE 7.0 01/08/2022 1357   GLUCOSEU NEGATIVE 01/08/2022 1357   HGBUR NEGATIVE 01/08/2022 1357   BILIRUBINUR NEGATIVE 01/08/2022 1357   KETONESUR NEGATIVE 01/08/2022 1357   PROTEINUR 30 (A) 01/08/2022 1357   NITRITE NEGATIVE 01/08/2022 1357   LEUKOCYTESUR NEGATIVE 01/08/2022 1357   Sepsis Labs Recent Labs  Lab 01/08/22 1407 01/09/22 0111  WBC 4.9 6.4   Microbiology Recent Results (from the past 240 hour(s))  SARS Coronavirus 2 by RT PCR (hospital order, performed in [place] hospital lab) *cepheid single result test* Anterior Nasal Swab     Status: None   Collection Time: 01/08/22  6:15 PM   Specimen: Anterior Nasal Swab  Result Value Ref Range Status   SARS Coronavirus 2 by RT PCR NEGATIVE NEGATIVE Final    Comment: (NOTE) SARS-CoV-2 target nucleic acids are NOT DETECTED.  The SARS-CoV-2 RNA is generally detectable in upper  and lower respiratory specimens during the acute phase of infection. The lowest concentration of SARS-CoV-2 viral copies this assay can detect is 250 copies / mL. A negative result does not preclude SARS-CoV-2 infection and should not be used as the sole basis for treatment or other patient management decisions.  A negative result may occur with improper specimen collection / handling, submission of specimen other than nasopharyngeal swab, presence of viral mutation(s) within the areas targeted by this assay, and inadequate number of viral copies (<250 copies / mL). A negative result must be combined with clinical observations, patient history, and epidemiological information.  Fact Sheet for Patients:   https://www.patel.info/  Fact  Sheet for Healthcare Providers: https://hall.com/  This test is not yet approved or  cleared by the [place] and has been authorized for detection and/or diagnosis of SARS-CoV-2 by FDA under an Emergency Use Authorization (EUA).  This EUA will remain in effect (meaning this test can be used) for the duration of the COVID-19 declaration under Section 564(b)(1) of the Act, 21 U.S.C. section 360bbb-3(b)(1), unless the authorization is terminated or revoked sooner.  Performed at [facility], [address], [place], [place] [address]      Time coordinating discharge: Over 30 minutes  SIGNED:   Nolberto Hanlon, MD  [facility] 01/10/2022, 11:56 AM Pager   If 7PM-7AM, please contact night-coverage [email or web address] Password [email or web address]

## 2022-01-10 NOTE — Evaluation (Addendum)
Physical Therapy Evaluation Patient Details Name: [name] MRN: [id number] DOB: 09/22/1931 Today's Date: 01/10/2022  History of Present Illness  Patient is a 88 year old male with past medical history of bilateral asymptomatic carotid stenosis, hypertension, sleep apnea on CPAP, history of malignant laryngeal neoplasm status post XRT, presented to the emergency room via EMS with complaints of dizziness and lightheadedness. MRI showed a small acute to early subacute nonhemorrhagic central cerebellar infarct.   Clinical Impression  The patient is agreeable to PT evaluation. He is independent at baseline without assistive device and works at [employer]. He lives with his spouse in a single story home.  Today, the patient is modified independent for most activity. No physical assistance is required with mobility. The patient ambulated in hallway with wide base of support and occasional mild staggering to left and right. Educated patient on progressing activity slowly and establishing a walking schedule/routine to maintain strength and conditioning. Consider using patient's cane for community distance ambulation at this time. Overall, patient is mobilizing well and could be discharged home. PT will follow while in the hospital to maximize independence with community ambulation and maximize readiness to return to work.      Recommendations for follow up therapy are one component of a multi-disciplinary discharge planning process, led by the attending physician.  Recommendations may be updated based on patient status, additional functional criteria and insurance authorization.  Follow Up Recommendations No PT follow up (patient declined follow up PT)      Assistance Recommended at Discharge PRN  Patient can return home with the following  Assist for transportation    Equipment Recommendations None recommended by PT  Recommendations for Other Services       Functional Status Assessment  Patient has had a recent decline in their functional status and demonstrates the ability to make significant improvements in function in a reasonable and predictable amount of time.     Precautions / Restrictions Precautions Precautions: Fall Restrictions Weight Bearing Restrictions: No      Mobility  Bed Mobility               General bed mobility comments: not observed    Transfers Overall transfer level: Modified independent                      Ambulation/Gait Ambulation/Gait assistance: Modified independent (Device/Increase time), Supervision (supervision at times with mild staggering, otherwise Mod I) Gait Distance (Feet): 170 Feet Assistive device: None Gait Pattern/deviations: Step-through pattern, Wide base of support, Staggering left, Staggering right Gait velocity: decreased     General Gait Details: occasional staggering to right or left with hallway ambulation with turns with no physical assistance required. he reached out for the counter in hallway once but not truly required for balance. wide base of support, slower but steady cadence. encouraged patient to consider using his single point cane for longer distance for community ambulation if feeling fatigued for safety. educated patient on a walking schedule and gradually building up ambulation distance.  Stairs            Wheelchair Mobility    Modified Rankin (Stroke Patients Only) Modified Rankin (Stroke Patients Only) Pre-Morbid Rankin Score: No symptoms Modified Rankin: Slight disability     Balance Overall balance assessment: Needs assistance Sitting-balance support: Feet supported Sitting balance-Leahy Scale: Good Sitting balance - Comments: no loss of balance noted with dynamic activity   Standing balance support: No upper extremity supported, During functional activity Standing  balance-Leahy Scale: Fair Standing balance comment: no external support required                              Pertinent Vitals/Pain Pain Assessment Pain Assessment: No/denies pain    Home Living Family/patient expects to be discharged to:: Private residence Living Arrangements: Spouse/significant other Available Help at Discharge: Family Type of Home: House Home Access: Level entry       Home Layout: One level Home Equipment: Kasandra Knudsen - single point      Prior Function Prior Level of Function : Independent/Modified Independent;Working/employed             Mobility Comments: patient is independent with mobility without assistive device. He works at [employer]        Extremity/Trunk Assessment   Upper Extremity Assessment Upper Extremity Assessment: Overall WFL for tasks assessed    Lower Extremity Assessment Lower Extremity Assessment: Overall WFL for tasks assessed       Communication   Communication: No difficulties  Cognition Arousal/Alertness: Awake/alert Behavior During Therapy: WFL for tasks assessed/performed Overall Cognitive Status: Within Functional Limits for tasks assessed                                 General Comments: patient is able to follow all commands without difficulty        General Comments      Exercises     Assessment/Plan    PT Assessment Patient needs continued PT services  PT Problem List Decreased activity tolerance;Decreased balance;Decreased mobility;Decreased safety awareness       PT Treatment Interventions DME instruction;Gait training;Functional mobility training;Therapeutic activities;Therapeutic exercise;Balance training;Neuromuscular re-education    PT Goals (Current goals can be found in the Care Plan section)  Acute Rehab PT Goals Patient Stated Goal: to go home today PT Goal Formulation: With patient Time For Goal Achievement: 01/24/22 Potential to Achieve Goals: Good    Frequency Min 2X/week     Co-evaluation               AM-PAC PT "6  Clicks" Mobility  Outcome Measure Help needed turning from your back to your side while in a flat bed without using bedrails?: None Help needed moving from lying on your back to sitting on the side of a flat bed without using bedrails?: None Help needed moving to and from a bed to a chair (including a wheelchair)?: None Help needed standing up from a chair using your arms (e.g., wheelchair or bedside chair)?: None Help needed to walk in hospital room?: None Help needed climbing 3-5 steps with a railing? : A Little 6 Click Score: 23    End of Session   Activity Tolerance: Patient tolerated treatment well Patient left:  (standing in room) Nurse Communication: Mobility status (via secure chat) PT Visit Diagnosis: Other abnormalities of gait and mobility (R26.89);Unsteadiness on feet (R26.81)    Time: 6948-5462 PT Time Calculation (min) (ACUTE ONLY): 20 min   Charges:   PT Evaluation $PT Eval Low Complexity: 1 Low PT Treatments $Therapeutic Activity: 8-22 mins        Minna Merritts, PT, MPT   Percell Locus 01/10/2022, 9:24 AM

## 2022-01-10 NOTE — Progress Notes (Signed)
Neurology Progress Note   S:// Patient seen and examined No acute changes.  Appreciate vascular surgery evaluation.  No surgical intervention at this time   O:// Current vital signs: BP (!) 167/76   Pulse 72   Temp 97.9 F (36.6 C)   Resp 20   Ht 5' 9.75" (1.772 m)   Wt 99.7 kg   SpO2 98%   BMI 31.76 kg/m  Vital signs in last 24 hours: Temp:  [97.7 F (36.5 C)-98 F (36.7 C)] 97.9 F (36.6 C) (09/05 0720) Pulse Rate:  [57-74] 72 (09/05 0720) Resp:  [14-20] 20 (09/05 0720) BP: (131-170)/(61-76) 167/76 (09/05 0720) SpO2:  [93 %-99 %] 98 % (09/05 0720) Weight:  [99.7 kg] 99.7 kg (09/04 1634) Unchanged neurological exam from yesterday General: Awake alert in no distress HEENT: Normocephalic atraumatic Lungs: Clear Cardiovascular: Regular rhythm Abdomen nondistended nontender Extremities: Left toe amputations Neurological exam Awake alert oriented x3 No dysarthria No aphasia Cranial nerves II to XII intact Motor examination with no drift in any of the 4 extremities Sensation intact to light touch all over without extinction Coordination exam: No dysmetria on finger-nose-finger testing but reported gait ataxia on walking.   NIHSS-0  Medications  Current Facility-Administered Medications:     stroke: early stages of recovery book, , Does not apply, Once, Clance Boll, MD   acetaminophen (TYLENOL) tablet 650 mg, 650 mg, Oral, Q4H PRN **OR** acetaminophen (TYLENOL) 160 MG/5ML solution 650 mg, 650 mg, Per Tube, Q4H PRN **OR** acetaminophen (TYLENOL) suppository 650 mg, 650 mg, Rectal, Q4H PRN, Clance Boll, MD   allopurinol (ZYLOPRIM) tablet 200 mg, 200 mg, Oral, Daily, Myles Rosenthal A, MD, 200 mg at 01/09/22 0927   amLODipine (NORVASC) tablet 5 mg, 5 mg, Oral, Daily, Kurtis Bushman, Sahar, MD, 5 mg at 01/09/22 1721   aspirin EC tablet 81 mg, 81 mg, Oral, Daily, Myles Rosenthal A, MD, 81 mg at 01/09/22 6789   clopidogrel (PLAVIX) tablet 75 mg, 75 mg, Oral,  Daily, Myles Rosenthal A, MD, 75 mg at 01/09/22 0927   donepezil (ARICEPT) tablet 5 mg, 5 mg, Oral, QHS, Myles Rosenthal A, MD, 5 mg at 01/09/22 2102   heparin injection 5,000 Units, 5,000 Units, Subcutaneous, Q8H, Myles Rosenthal A, MD, 5,000 Units at 01/10/22 0614   pantoprazole (PROTONIX) EC tablet 40 mg, 40 mg, Oral, Daily, Myles Rosenthal A, MD, 40 mg at 01/09/22 3810   senna-docusate (Senokot-S) tablet 1 tablet, 1 tablet, Oral, QHS PRN, Clance Boll, MD Labs CBC    Component Value Date/Time   WBC 6.4 01/09/2022 0111   RBC 3.77 (L) 01/09/2022 0111   HGB 12.0 (L) 01/09/2022 0111   HCT 36.0 (L) 01/09/2022 0111   PLT 221 01/09/2022 0111   MCV 95.5 01/09/2022 0111   MCH 31.8 01/09/2022 0111   MCHC 33.3 01/09/2022 0111   RDW 13.5 01/09/2022 0111   LYMPHSABS 1.7 12/26/2021 1402   MONOABS 0.4 12/26/2021 1402   EOSABS 0.1 12/26/2021 1402   BASOSABS 0.0 12/26/2021 1402    CMP     Component Value Date/Time   NA 136 01/08/2022 1407   K 4.6 01/08/2022 1407   CL 100 01/08/2022 1407   CO2 25 01/08/2022 1407   GLUCOSE 117 (H) 01/08/2022 1407   BUN 21 01/08/2022 1407   CREATININE 1.05 01/09/2022 0111   CALCIUM 8.8 (L) 01/08/2022 1407   PROT 6.5 12/26/2021 1402   ALBUMIN 3.9 12/26/2021 1402   AST 22 12/26/2021 1402   ALT 18 12/26/2021  1402   ALKPHOS 48 12/26/2021 1402   BILITOT 0.4 12/26/2021 1402   GFRNONAA >60 01/09/2022 0111   GFRAA >60 05/16/2019 0703    glycosylated hemoglobin 5.6 LDL 67  Lipid Panel     Component Value Date/Time   CHOL 136 01/09/2022 0111   TRIG 138 01/09/2022 0111   HDL 41 01/09/2022 0111   CHOLHDL 3.3 01/09/2022 0111   VLDL 28 01/09/2022 0111   LDLCALC 67 01/09/2022 0111   LDLDIRECT 76.0 02/05/2019 0752   2d Echo LVEF 60-65% - moderate LVH Grade 1 DD MV not well visualized. AV not well visualized Aortic dilatation noted - borderline aortic root - 90m LA normal size IAS not well visualized   Imaging I have reviewed  images in epic and the results pertinent to this consultation are: CT-head: No acute changes. CT angiography head and neck with occlusion of the dominant mid distal left V1 segment age-indeterminate but could be acute.  Distal reconstitution at the left P2 segment via cervical collaterals.  Additional extensive atheromatous change about the intracranial V4 segment with severe multifocal stenosis. Severe-85% stenosis at the origin of the right subclavian artery.  Right vertebral artery coming off of the aorta directly. Short segment 50% stenosis of the distal cervical left ICA 70% atheromatous stenosis about the right carotid bulb. Extensive atheromatous disease throughout the carotid siphons with associated moderate multifocal stenoses seen.   MRI examination of the brain: 1.7 cm central cerebellar infarct-acute to subacute.  Assessment: 88year old man with significant cerebrovascular risk factor history that includes history of carotid stenosis, hypertension, sleep apnea, history of malignant laryngeal neoplasm status post XRT, presented to the emergency room with complaints of dizziness and lightheadedness and noted to have a central cerebellar infarct which was acute to subacute.  He was outside the window for IV thrombolysis and vessel imaging did not reveal any ELVO. He has significant atherosclerosis of the cervical and intracranial vessels but no 1 specific vessel which requires emergent revascularization. His treatment would be medically optimized and he will continue to follow with neurology.  Vascular surgery evaluation was requested and completed-appreciate evaluation.  See the consult note from Dr. BTrula Sladefor details.  Impression: Acute ischemic stroke-likely secondary to atherosclerotic cerebrovascular disease  Recommendations: ASA + Plavix for 3 months followed by plavix only No need for statin - LDL Within goal without statin at home. Appreciate vascular recs - no surgical  intervention needed at this time. Optimal medical management as above Follow-up with outpatient neurology in 4 to 6 weeks-family told me that he is is an established patient of [facility] but his last visit was 2018-May need a new patient referral if not seen in the last 3 years-please provide at the time of discharge.  -- AAmie Portland MD Neurologist [facility] Pager: [phone]

## 2022-01-10 NOTE — Care Management CC44 (Signed)
Condition Code 44 Documentation Completed  Patient Details  Name: [name] MRN: [id number] Date of Birth: 1932-01-28   Condition Code 44 given:  Yes Patient signature on Condition Code 44 notice:  Yes Documentation of 2 MD's agreement:  Yes Code 44 added to claim:  Yes    Alberteen Sam, LCSW 01/10/2022, 12:24 PM

## 2022-01-11 ENCOUNTER — Ambulatory Visit (HOSPITAL_COMMUNITY)
Admission: RE | Admit: 2022-01-11 | Payer: Medicare Other | Source: Ambulatory Visit | Attending: Family Medicine | Admitting: Family Medicine

## 2022-01-17 ENCOUNTER — Ambulatory Visit (INDEPENDENT_AMBULATORY_CARE_PROVIDER_SITE_OTHER): Payer: Medicare Other | Admitting: Family Medicine

## 2022-01-17 ENCOUNTER — Telehealth: Payer: Self-pay

## 2022-01-17 ENCOUNTER — Encounter: Payer: Self-pay | Admitting: Family Medicine

## 2022-01-17 VITALS — BP 134/68 | HR 52 | Temp 97.3°F | Ht 69.75 in | Wt 221.0 lb

## 2022-01-17 DIAGNOSIS — D649 Anemia, unspecified: Secondary | ICD-10-CM | POA: Diagnosis not present

## 2022-01-17 DIAGNOSIS — I440 Atrioventricular block, first degree: Secondary | ICD-10-CM | POA: Diagnosis not present

## 2022-01-17 DIAGNOSIS — I6523 Occlusion and stenosis of bilateral carotid arteries: Secondary | ICD-10-CM

## 2022-01-17 DIAGNOSIS — I10 Essential (primary) hypertension: Secondary | ICD-10-CM | POA: Diagnosis not present

## 2022-01-17 DIAGNOSIS — E785 Hyperlipidemia, unspecified: Secondary | ICD-10-CM

## 2022-01-17 DIAGNOSIS — I639 Cerebral infarction, unspecified: Secondary | ICD-10-CM | POA: Diagnosis not present

## 2022-01-17 LAB — BASIC METABOLIC PANEL
BUN: 21 mg/dL (ref 6–23)
CO2: 27 mEq/L (ref 19–32)
Calcium: 9.3 mg/dL (ref 8.4–10.5)
Chloride: 102 mEq/L (ref 96–112)
Creatinine, Ser: 1.16 mg/dL (ref 0.40–1.50)
GFR: 56.41 mL/min — ABNORMAL LOW (ref >60.00)
Glucose, Bld: 82 mg/dL (ref 70–99)
Potassium: 4.3 mEq/L (ref 3.5–5.1)
Sodium: 137 mEq/L (ref 135–145)

## 2022-01-17 LAB — CBC WITH DIFFERENTIAL/PLATELET
Basophils Absolute: 0.1 10*3/uL (ref 0.0–0.1)
Basophils Relative: 0.9 % (ref 0.0–3.0)
Eosinophils Absolute: 0.1 10*3/uL (ref 0.0–0.7)
Eosinophils Relative: 2.2 % (ref 0.0–5.0)
HCT: 35.5 % — ABNORMAL LOW (ref 39.0–52.0)
Hemoglobin: 12.3 g/dL — ABNORMAL LOW (ref 13.0–17.0)
Lymphocytes Relative: 30.6 % (ref 12.0–46.0)
Lymphs Abs: 1.8 10*3/uL (ref 0.7–4.0)
MCHC: 34.6 g/dL (ref 30.0–36.0)
MCV: 95 fl (ref 78.0–100.0)
Monocytes Absolute: 0.6 10*3/uL (ref 0.1–1.0)
Monocytes Relative: 9.9 % (ref 3.0–12.0)
Neutro Abs: 3.3 10*3/uL (ref 1.4–7.7)
Neutrophils Relative %: 56.4 % (ref 43.0–77.0)
Platelets: 240 10*3/uL (ref 150.0–400.0)
RBC: 3.73 Mil/uL — ABNORMAL LOW (ref 4.22–5.81)
RDW: 14.4 % (ref 11.5–15.5)
WBC: 5.8 10*3/uL (ref 4.0–10.5)

## 2022-01-17 LAB — IBC PANEL
Iron: 117 ug/dL (ref 42–165)
Saturation Ratios: 34.5 % (ref 20.0–50.0)
TIBC: 338.8 ug/dL (ref 250.0–450.0)
Transferrin: 242 mg/dL (ref 212.0–360.0)

## 2022-01-17 LAB — FERRITIN: Ferritin: 232.4 ng/mL (ref 22.0–322.0)

## 2022-01-17 NOTE — Chronic Care Management (AMB) (Addendum)
    Chronic Care Management Pharmacy Assistant   Name: [name]  MRN: [id number] DOB: 03-11-1932   Reason for Encounter: Hospital Follow Up - Non CCM   Medications: Outpatient Encounter Medications as of 01/17/2022  Medication Sig   acetaminophen (TYLENOL) 500 MG tablet Take 1-2 tablets (500-1,000 mg total) by mouth 2 (two) times daily as needed for moderate pain. (Patient taking differently: Take 500-1,000 mg by mouth 2 (two) times daily as needed for moderate pain. Pt states taking 3 tablets)   allopurinol (ZYLOPRIM) 100 MG tablet TAKE 2 TABLETS(200 MG) BY MOUTH DAILY   amLODipine (NORVASC) 10 MG tablet Take 1 tablet (10 mg total) by mouth daily.   aspirin EC 81 MG tablet Take 81 mg by mouth daily.   azelastine (ASTELIN) 0.1 % nasal spray Place 1-2 sprays into both nostrils 2 (two) times daily. Use in each nostril as directed   carboxymethylcellulose (REFRESH TEARS) 0.5 % SOLN Place 1 drop into both eyes daily as needed (dry eyes).   cetirizine (ZYRTEC) 10 MG tablet Take 10 mg by mouth daily. Alternates weekly with allegra.   clopidogrel (PLAVIX) 75 MG tablet Take 1 tablet (75 mg total) by mouth daily.   donepezil (ARICEPT) 5 MG tablet Take 1 tablet (5 mg total) by mouth at bedtime.   esomeprazole (NEXIUM) 20 MG capsule TAKE ONE CAPSULE BY MOUTH DAILY AT 12:00 PM   fexofenadine (ALLEGRA) 180 MG tablet Take 180 mg by mouth daily. Alternates weekly with zyrtec.   GLUCOSAMINE-CHONDROITIN PO Take 2 tablets by mouth daily.   hydrALAZINE (APRESOLINE) 25 MG tablet Take 1 tablet (25 mg total) by mouth 3 (three) times daily.   Magnesium 250 MG TABS Take 1 tablet (250 mg total) by mouth daily.   Multiple Vitamins-Minerals (MULTIVITAMIN ADULT PO) Take 1 tablet by mouth daily.   polyethylene glycol (MIRALAX / GLYCOLAX) packet Take 8.5 g by mouth daily.    No facility-administered encounter medications on file as of 01/17/2022.    Reviewed hospital notes for details of recent  visit. Patient has been contacted by Transitions of Care team: No  Admitted to the hospital on 01/08/22. Discharge date was 01/10/22.  Discharged from [facility].   Discharge diagnosis (Principal Problem): CVA Patient was discharged to Home  Brief summary of hospital course: Pre-diabetes who presents to the ED with complaints of being off balance and dizzy . Found with Acute to early subacute ischemic nonhemorrhagic infarct. Patient's MRI is positive for cerebellar stroke  New?Medications Started at [facility] Discharge:?? -Started clopidogrel   Medications Discontinued at Hospital Discharge: -Stopped Losartan  Naproxen  Potassium  Medications that remain the same after Hospital Discharge:??  -All other medications will remain the same.    Next CCM appt: none  Other upcoming appts: PCP appointment on 01/17/22  Charlene Brooke, PharmD notified and will determine if action is needed.   Avel Sensor, [place]  [phone]

## 2022-01-17 NOTE — Patient Instructions (Addendum)
Continue to hold losartan for now.  Keep appointment with Dr Rexene Alberts 02/07/2022.  Plan is aspirin + plavix for 3 months after stroke, then transition to just plavix daily.  You saw Dr Trula Slade vascular surgeon in hospital.  I would like you to see heart doctor for ongoing slow heart rate even off metoprolol - referral placed today.  Good to see you today.  Labs today.

## 2022-01-17 NOTE — Progress Notes (Signed)
Patient ID: [name], male    DOB: Oct 05, 1931, 88 y.o.   MRN: [id number]  This visit was conducted in person.  BP 134/68   Pulse (!) 52   Temp (!) 97.3 F (36.3 C) (Temporal)   Ht 5' 9.75" (1.772 m)   Wt 221 lb (100.2 kg)   SpO2 97%   BMI 31.94 kg/m    CC: hosp f/u visit  Subjective:   HPI: [name] is a 88 y.o. male presenting on 01/17/2022 for Hospitalization Follow-up (Admitted on 01/08/22 at [facility], dx CVA. Pt accompanied by wife, Stanton Kidney. )   Recent hospitalization for acute stroke which presented with imbalance/dizziness. Brain imaging showed acute to subacute ischemic nonhemorrhagic infarct of central cerebellum.  Recommend plavix + aspirin x 3 months then start plavix alone.  Vascular surgery evaluated patient for carotid stenosis - no intervention recommended. Found to have RICA 41%, LICA 28% stenosis, as well as R subclavian stenosis 85% at origin - thought noncontributory.  Echo with normal EF.  Hospital records reviewed. Med rec performed.   Losartan held until appt today - home BP readings running well controlled 130-140/70s.   Amlodipine '10mg'$  daily, hydralazine '25mg'$  TID continued.  Toprol XL - previously stopped due to bradycardia.   Home health not set up.  Other follow up appointments scheduled: neurology  ______________________________________________________________________ Hospital admission: 01/08/2022 Hospital discharge: 01/10/2022 TCM f/u phone call: not performed   Recommendations for Outpatient Follow-up:  Follow up with PCP in 1 week Please obtain BMP/CBC in one week Please follow up with neurology in one month   Discharge Condition:Stable CODE STATUS:full  Diet recommendation: Heart Healthy   Discharge Diagnoses:  Principal Problem:   CVA (cerebral vascular accident) ([place]) Active Problems:   GERD (gastroesophageal reflux disease)   OSA on CPAP   Essential hypertension   History of malignant neoplasm of larynx   Prediabetes      Relevant past medical, surgical, family and social history reviewed and updated as indicated. Interim medical history since our last visit reviewed. Allergies and medications reviewed and updated. Outpatient Medications Prior to Visit  Medication Sig Dispense Refill   acetaminophen (TYLENOL) 500 MG tablet Take 1-2 tablets (500-1,000 mg total) by mouth 2 (two) times daily as needed for moderate pain. (Patient taking differently: Take 500-1,000 mg by mouth 2 (two) times daily as needed for moderate pain. Pt states taking 3 tablets)     allopurinol (ZYLOPRIM) 100 MG tablet TAKE 2 TABLETS(200 MG) BY MOUTH DAILY 180 tablet 1   amLODipine (NORVASC) 10 MG tablet Take 1 tablet (10 mg total) by mouth daily. 90 tablet 3   aspirin EC 81 MG tablet Take 81 mg by mouth daily.     azelastine (ASTELIN) 0.1 % nasal spray Place 1-2 sprays into both nostrils 2 (two) times daily. Use in each nostril as directed 30 mL 6   carboxymethylcellulose (REFRESH TEARS) 0.5 % SOLN Place 1 drop into both eyes daily as needed (dry eyes).     cetirizine (ZYRTEC) 10 MG tablet Take 10 mg by mouth daily. Alternates weekly with allegra.     clopidogrel (PLAVIX) 75 MG tablet Take 1 tablet (75 mg total) by mouth daily. 30 tablet 0   donepezil (ARICEPT) 5 MG tablet Take 1 tablet (5 mg total) by mouth at bedtime. 30 tablet 6   esomeprazole (NEXIUM) 20 MG capsule TAKE ONE CAPSULE BY MOUTH DAILY AT 12:00 PM 90 capsule 3   fexofenadine (ALLEGRA) 180 MG tablet Take  180 mg by mouth daily. Alternates weekly with zyrtec.     GLUCOSAMINE-CHONDROITIN PO Take 2 tablets by mouth daily.     hydrALAZINE (APRESOLINE) 25 MG tablet Take 1 tablet (25 mg total) by mouth 3 (three) times daily. 90 tablet 3   Magnesium 250 MG TABS Take 1 tablet (250 mg total) by mouth daily.  0   Multiple Vitamins-Minerals (MULTIVITAMIN ADULT PO) Take 1 tablet by mouth daily.     polyethylene glycol (MIRALAX / GLYCOLAX) packet Take 8.5 g by mouth daily.      No  facility-administered medications prior to visit.     Per HPI unless specifically indicated in ROS section below Review of Systems  Objective:  BP 134/68   Pulse (!) 52   Temp (!) 97.3 F (36.3 C) (Temporal)   Ht 5' 9.75" (1.772 m)   Wt 221 lb (100.2 kg)   SpO2 97%   BMI 31.94 kg/m   Wt Readings from Last 3 Encounters:  01/17/22 221 lb (100.2 kg)  01/09/22 219 lb 12.8 oz (99.7 kg)  01/03/22 221 lb (100.2 kg)      Physical Exam Vitals and nursing note reviewed.  Constitutional:      Appearance: Normal appearance. He is not ill-appearing.  HENT:     Mouth/Throat:     Mouth: Mucous membranes are moist.     Pharynx: Oropharynx is clear. No oropharyngeal exudate or posterior oropharyngeal erythema.  Eyes:     Extraocular Movements: Extraocular movements intact.     Pupils: Pupils are equal, round, and reactive to light.  Neck:     Vascular: Carotid bruit (bilat) present.  Cardiovascular:     Rate and Rhythm: Regular rhythm. Bradycardia present.     Pulses: Normal pulses.     Heart sounds: Normal heart sounds. No murmur heard. Pulmonary:     Effort: Pulmonary effort is normal. No respiratory distress.     Breath sounds: Normal breath sounds. No wheezing or rhonchi.  Musculoskeletal:     Right lower leg: Edema (tr) present.     Left lower leg: Edema (tr) present.  Skin:    General: Skin is warm and dry.     Findings: No rash.  Neurological:     Mental Status: He is alert.  Psychiatric:        Mood and Affect: Mood normal.        Behavior: Behavior normal.       Results for orders placed or performed in visit on 01/17/22  CBC with Differential/Platelet  Result Value Ref Range   WBC 5.8 4.0 - 10.5 K/uL   RBC 3.73 (L) 4.22 - 5.81 Mil/uL   Hemoglobin 12.3 (L) 13.0 - 17.0 g/dL   HCT 35.5 (L) 39.0 - 52.0 %   MCV 95.0 78.0 - 100.0 fl   MCHC 34.6 30.0 - 36.0 g/dL   RDW 14.4 11.5 - 15.5 %   Platelets 240.0 150.0 - 400.0 K/uL   Neutrophils Relative % 56.4 43.0 - 77.0  %   Lymphocytes Relative 30.6 12.0 - 46.0 %   Monocytes Relative 9.9 3.0 - 12.0 %   Eosinophils Relative 2.2 0.0 - 5.0 %   Basophils Relative 0.9 0.0 - 3.0 %   Neutro Abs 3.3 1.4 - 7.7 K/uL   Lymphs Abs 1.8 0.7 - 4.0 K/uL   Monocytes Absolute 0.6 0.1 - 1.0 K/uL   Eosinophils Absolute 0.1 0.0 - 0.7 K/uL   Basophils Absolute 0.1 0.0 - 0.1 K/uL  Basic  metabolic panel  Result Value Ref Range   Sodium 137 135 - 145 mEq/L   Potassium 4.3 3.5 - 5.1 mEq/L   Chloride 102 96 - 112 mEq/L   CO2 27 19 - 32 mEq/L   Glucose, Bld 82 70 - 99 mg/dL   BUN 21 6 - 23 mg/dL   Creatinine, Ser 1.16 0.40 - 1.50 mg/dL   GFR 56.41 (L) >60.00 mL/min   Calcium 9.3 8.4 - 10.5 mg/dL  IBC panel  Result Value Ref Range   Iron 117 42 - 165 ug/dL   Transferrin 242.0 212.0 - 360.0 mg/dL   Saturation Ratios 34.5 20.0 - 50.0 %   TIBC 338.8 250.0 - 450.0 mcg/dL  Ferritin  Result Value Ref Range   Ferritin 232.4 22.0 - 322.0 ng/mL   Lab Results  Component Value Date   CHOL 136 01/09/2022   HDL 41 01/09/2022   LDLCALC 67 01/09/2022   LDLDIRECT 76.0 02/05/2019   TRIG 138 01/09/2022   CHOLHDL 3.3 01/09/2022    Lab Results  Component Value Date   HGBA1C 5.6 01/09/2022    Assessment & Plan:   Problem List Items Addressed This Visit     HTN (hypertension)    Will stay off losartan and toprol XL at this time - as BP is well controlled on current regimen of amlodipine '10mg'$  daily, hydralazine '25mg'$  tid.       Carotid stenosis, asymptomatic, bilateral    Recent neck CTA showing progression of carotid atherosclerosis. Continue DAPT.       Dyslipidemia    Cholesterol levels largely controlled, off statin. Consider starting in new CVA history. Was not recommended starting this during hospitalization. Will await neurology input.       AV block, 1st degree    Stay off BB. Pending cards eval.       Relevant Orders   Ambulatory referral to Cardiology   Anemia    Update CBC, iron panel.       Relevant  Orders   CBC with Differential/Platelet (Completed)   IBC panel (Completed)   Ferritin (Completed)   Cerebellar stroke ([place]) - Primary    Seems to be recovering well after recent cerebellar stroke.  Discussed return to work.  Discussed DAPT x 3 months then transition to plavix '75mg'$  daily.  He will keep upcoming neurology appointment.  Consider statin, did not start given age and normal cholesterol levels.       Relevant Orders   CBC with Differential/Platelet (Completed)   Basic metabolic panel (Completed)   RESOLVED: Essential hypertension   Relevant Orders   Ambulatory referral to Cardiology     No orders of the defined types were placed in this encounter.  Orders Placed This Encounter  Procedures   CBC with Differential/Platelet   Basic metabolic panel   IBC panel   Ferritin   Ambulatory referral to Cardiology    Referral Priority:   Routine    Referral Type:   Consultation    Referral Reason:   Specialty Services Required    Requested Specialty:   Cardiology    Number of Visits Requested:   1    Patient Instructions  Continue to hold losartan for now.  Keep appointment with Dr Rexene Alberts 02/07/2022.  Plan is aspirin + plavix for 3 months after stroke, then transition to just plavix daily.  You saw Dr Trula Slade vascular surgeon in hospital.  I would like you to see heart doctor for ongoing slow heart rate even off  metoprolol - referral placed today.  Good to see you today.  Labs today.   Follow up plan: Return if symptoms worsen or fail to improve.  Ria Bush, MD

## 2022-01-19 DIAGNOSIS — M9903 Segmental and somatic dysfunction of lumbar region: Secondary | ICD-10-CM | POA: Diagnosis not present

## 2022-01-19 DIAGNOSIS — M9905 Segmental and somatic dysfunction of pelvic region: Secondary | ICD-10-CM | POA: Diagnosis not present

## 2022-01-19 DIAGNOSIS — M5416 Radiculopathy, lumbar region: Secondary | ICD-10-CM | POA: Diagnosis not present

## 2022-01-19 DIAGNOSIS — M955 Acquired deformity of pelvis: Secondary | ICD-10-CM | POA: Diagnosis not present

## 2022-01-19 NOTE — Assessment & Plan Note (Signed)
Update CBC, iron panel.

## 2022-01-19 NOTE — Assessment & Plan Note (Addendum)
Recent neck CTA showing progression of carotid atherosclerosis. Continue DAPT.

## 2022-01-19 NOTE — Assessment & Plan Note (Signed)
Stay off BB. Pending cards eval.

## 2022-01-19 NOTE — Assessment & Plan Note (Signed)
Will stay off losartan and toprol XL at this time - as BP is well controlled on current regimen of amlodipine '10mg'$  daily, hydralazine '25mg'$  tid.

## 2022-01-19 NOTE — Assessment & Plan Note (Signed)
Cholesterol levels largely controlled, off statin. Consider starting in new CVA history. Was not recommended starting this during hospitalization. Will await neurology input.

## 2022-01-19 NOTE — Assessment & Plan Note (Addendum)
Seems to be recovering well after recent cerebellar stroke.  Discussed return to work.  Discussed DAPT x 3 months then transition to plavix '75mg'$  daily.  He will keep upcoming neurology appointment.  Consider statin, did not start given age and normal cholesterol levels.

## 2022-02-07 ENCOUNTER — Telehealth: Payer: Self-pay | Admitting: *Deleted

## 2022-02-07 ENCOUNTER — Other Ambulatory Visit: Payer: Self-pay | Admitting: Cardiovascular Disease

## 2022-02-07 ENCOUNTER — Encounter: Payer: Self-pay | Admitting: Cardiovascular Disease

## 2022-02-07 ENCOUNTER — Encounter: Payer: Self-pay | Admitting: Neurology

## 2022-02-07 ENCOUNTER — Ambulatory Visit (INDEPENDENT_AMBULATORY_CARE_PROVIDER_SITE_OTHER): Payer: Medicare Other

## 2022-02-07 ENCOUNTER — Ambulatory Visit: Payer: Medicare Other | Admitting: Neurology

## 2022-02-07 ENCOUNTER — Ambulatory Visit: Payer: Medicare Other | Attending: Cardiovascular Disease | Admitting: Cardiovascular Disease

## 2022-02-07 VITALS — BP 148/64 | HR 51 | Ht 69.5 in | Wt 223.0 lb

## 2022-02-07 VITALS — BP 148/64 | HR 53 | Ht 69.0 in | Wt 223.8 lb

## 2022-02-07 DIAGNOSIS — R001 Bradycardia, unspecified: Secondary | ICD-10-CM | POA: Diagnosis not present

## 2022-02-07 DIAGNOSIS — I6523 Occlusion and stenosis of bilateral carotid arteries: Secondary | ICD-10-CM | POA: Diagnosis not present

## 2022-02-07 DIAGNOSIS — G4733 Obstructive sleep apnea (adult) (pediatric): Secondary | ICD-10-CM

## 2022-02-07 DIAGNOSIS — I639 Cerebral infarction, unspecified: Secondary | ICD-10-CM

## 2022-02-07 DIAGNOSIS — I4891 Unspecified atrial fibrillation: Secondary | ICD-10-CM

## 2022-02-07 DIAGNOSIS — I10 Essential (primary) hypertension: Secondary | ICD-10-CM | POA: Diagnosis not present

## 2022-02-07 HISTORY — DX: Bradycardia, unspecified: R00.1

## 2022-02-07 NOTE — Assessment & Plan Note (Signed)
[name] runs a resting heart rate in the 50s.  This is sinus bradycardia.  He is on no negative chronotropic medications.  He is really asymptomatic from this and no further work-up or intervention is necessary.

## 2022-02-07 NOTE — Telephone Encounter (Signed)
Called [employer] to check on status of order for new cpap machine. Representative saw order and said it was on hold but there no reason noted. She said she would ask the local team to look into this urgently and give [place] a call back. I also sent a community message to them as well.

## 2022-02-07 NOTE — Patient Instructions (Signed)
It was nice to see you again today.  I am glad to hear that you are doing well.  As discussed, you will continue with baby aspirin and Plavix for total of 3 months since your hospitalization and then with Plavix only, you can request refills from your primary care.  Please follow-up with cardiology after your heart monitor is planned.  Please follow-up with [place] as scheduled with Amy next year for sleep apnea. Continue exercising regularly and take your medications as directed. As discussed, secondary prevention is key after a stroke. This means: taking care of blood sugar values or diabetes management (A1c goal of less than 7.0), good blood pressure (hypertension) control and optimizing cholesterol management (with LDL goal of less than 70), exercising daily or regularly within your own mobility limitations of course, and overall cardiovascular risk factor reduction, which includes screening for and treatment of obstructive sleep apnea (OSA) and weight management.   Please also follow-up with your vascular surgeon as planned.

## 2022-02-07 NOTE — Progress Notes (Unsigned)
Enrolled for [employer] to mail a ZIO XT long term holter monitor to the patients address on file.  

## 2022-02-07 NOTE — Assessment & Plan Note (Signed)
[name] was recently hospitalized with a cerebellar stroke.  CT scan showed occluded left vertebral at the V1 segment with distal reconstitution.  He was seen by Dr. Trula Slade who did not feel that there were any interventional options and recommended medical therapy.  A 2D echo was essentially normal.  I am going to get a 2-week Zio patch to rule out silent A-fib.

## 2022-02-07 NOTE — Assessment & Plan Note (Signed)
History of obstructive sleep apnea on CPAP. 

## 2022-02-07 NOTE — Progress Notes (Signed)
02/07/2022 [name]   08-23-1931  [id number]  Primary Physician Ria Bush, MD Primary Cardiologist: Lorretta Harp MD FACP, [place], [place], [place]  HPI:  [name] is a 88 y.o. moderately overweight married Caucasian male with no children is accompanied by his wife [name] today.  He was referred by his PCP, Dr. Danise Robles, for evaluation of bradycardia.  He has no cardiac risk factors other than treated hypertension.  He is never smoked Gwenlyn Found stopped drinking 13 years ago.  There is a family history of heart disease with both of his younger siblings who have had stents in the early 37s.  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  He is fairly active and denies chest pain or shortness of breath.  He does have obstructive sleep apnea on CPAP.   Current Meds  Medication Sig   acetaminophen (TYLENOL) 500 MG tablet Take 1-2 tablets (500-1,000 mg total) by mouth 2 (two) times daily as needed for moderate pain. (Patient taking differently: Take 500-1,000 mg by mouth 2 (two) times daily as needed for moderate pain. Pt states taking 3 tablets)   allopurinol (ZYLOPRIM) 100 MG tablet TAKE 2 TABLETS(200 MG) BY MOUTH DAILY   amLODipine (NORVASC) 10 MG tablet Take 1 tablet (10 mg total) by mouth daily.   aspirin EC 81 MG tablet Take 81 mg by mouth daily.   azelastine (ASTELIN) 0.1 % nasal spray Place 1-2 sprays into both nostrils 2 (two) times daily. Use in each nostril as directed   carboxymethylcellulose (REFRESH TEARS) 0.5 % SOLN Place 1 drop into both eyes daily as needed (dry eyes).   cetirizine (ZYRTEC) 10 MG tablet Take 10 mg by mouth daily. Alternates weekly with allegra.   clopidogrel (PLAVIX) 75 MG tablet Take 1 tablet (75 mg total) by  mouth daily.   donepezil (ARICEPT) 5 MG tablet Take 1 tablet (5 mg total) by mouth at bedtime.   esomeprazole (NEXIUM) 20 MG capsule TAKE ONE CAPSULE BY MOUTH DAILY AT 12:00 PM   fexofenadine (ALLEGRA) 180 MG tablet Take 180 mg by mouth daily. Alternates weekly with zyrtec.   GLUCOSAMINE-CHONDROITIN PO Take 2 tablets by mouth daily.   hydrALAZINE (APRESOLINE) 25 MG tablet Take 1 tablet (25 mg total) by mouth 3 (three) times daily.   Magnesium 250 MG TABS Take 1 tablet (250 mg total) by mouth daily.   Multiple Vitamins-Minerals (MULTIVITAMIN ADULT PO) Take 1 tablet by mouth daily.   polyethylene glycol (MIRALAX / GLYCOLAX) packet Take 8.5 g by mouth daily.      Allergies  Allergen Reactions   Lisinopril Cough    Social History   Socioeconomic History   Marital status: Married    Spouse name: Not on file   Number of children: Not on file   Years of education: Not on file   Highest education level: Bachelor's degree (e.g., BA, AB, BS)  Occupational History   Not on file  Tobacco Use   Smoking status: Never   Smokeless tobacco: Never  Vaping Use   Vaping Use: Never used  Substance and Sexual Activity   Alcohol use: Yes    Comment: occasional wine   Drug use: No   Sexual activity: Not on  file  Other Topics Concern   Not on file  Social History Narrative   "[name]"   Widower. 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   Edu: college chemistry degree   Right handed   Caffeine: Drinks 1 cup of coffee every morning    Social Determinants of Radio broadcast assistant Strain: Low Risk  (02/11/2020)   Overall Financial Resource Strain (CARDIA)    Difficulty of Paying Living Expenses: Not hard at all  Food Insecurity: No Food Insecurity (02/11/2020)   Hunger Vital Sign    Worried About Robles Out of Food in the Last Year: Never true    Ran Out of Food in the Last Year: Never true  Transportation Needs: No Transportation Needs  (02/11/2020)   PRAPARE - Hydrologist (Medical): No    Lack of Transportation (Non-Medical): No  Physical Activity: Sufficiently Active (02/11/2020)   Exercise Vital Sign    Days of Exercise per Week: 7 days    Minutes of Exercise per Session: 30 min  Stress: No Stress Concern Present (02/11/2020)   [place]    Feeling of Stress : Not at all  Social Connections: Not on file  Intimate Partner Violence: Not At Risk (02/11/2020)   Humiliation, Afraid, Rape, and Kick questionnaire    Fear of Current or Ex-Partner: No    Emotionally Abused: No    Physically Abused: No    Sexually Abused: No     Review of Systems: General: negative for chills, fever, night sweats or weight changes.  Cardiovascular: negative for chest pain, dyspnea on exertion, edema, orthopnea, palpitations, paroxysmal nocturnal dyspnea or shortness of breath Dermatological: negative for rash Respiratory: negative for cough or wheezing Urologic: negative for hematuria Abdominal: negative for nausea, vomiting, diarrhea, bright red blood per rectum, melena, or hematemesis Neurologic: negative for visual changes, syncope, or dizziness All other systems reviewed and are otherwise negative except as noted above.    Blood pressure (!) 148/64, pulse (!) 53, height '5\' 9"'$  (1.753 m), weight 223 lb 12.8 oz (101.5 kg), SpO2 94 %.  General appearance: alert and no distress Neck: no adenopathy, no carotid bruit, no JVD, supple, symmetrical, trachea midline, and thyroid not enlarged, symmetric, no tenderness/mass/nodules Lungs: clear to auscultation bilaterally Heart: regular rate and rhythm, S1, S2 normal, no murmur, click, rub or gallop Extremities: extremities normal, atraumatic, no cyanosis or edema Pulses: 2+ and symmetric Skin: Skin color, texture, turgor normal. No rashes or lesions Neurologic: Grossly normal  EKG not performed  today  ASSESSMENT AND PLAN:   HTN (hypertension) History of essential hypertension a blood pressure measured today at 148/64.  He is on amlodipine and hydralazine.  OSA on CPAP History of obstructive sleep apnea on CPAP  Carotid stenosis, asymptomatic, bilateral History of bilateral carotid disease followed by Dr. Trula Slade which she is currently asymptomatic from.  He was also incidentally found to have right subclavian artery stenosis  Cerebellar stroke [facility]) Mr. Topper was recently hospitalized with a cerebellar stroke.  CT scan showed occluded left vertebral at the V1 segment with distal reconstitution.  He was seen by Dr. Trula Slade who did not feel that there were any interventional options and recommended medical therapy.  A 2D echo was essentially normal.  I am going to get a 2-week Zio patch to rule out silent A-fib.  Slow heart rate Mr. Molony runs a resting  heart rate in the 50s.  This is sinus bradycardia.  He is on no negative chronotropic medications.  He is really asymptomatic from this and no further work-up or intervention is necessary.     Lorretta Harp MD FACP,FACC,FAHA, [facility] 02/07/2022 9:46 AM

## 2022-02-07 NOTE — Progress Notes (Signed)
Subjective:    Patient ID: [name] is a 88 y.o. male.  HPI    Interim history:   I saw [name] as a referral from the hospital after a recent stroke.  The patient is accompanied by his wife today.  We have followed him for his sleep apnea for the past several years.  [name] is an 88 year old right-handed gentleman with an underlying medical history of sleep apnea, on CPAP therapy, gout, history of laryngeal cancer with status post XRT, carotid artery stenosis, bradycardia, hypertension, hyperlipidemia, reflux disease, prediabetes, and mild obesity, who presented to the emergency room with feeling of dizziness and off-balance.  He reports feeling back to baseline.  She tries to hydrate well.  He does not have a family history of stroke, he was a never smoker.  He does have a history of arthritis.  He has had low heart rate off and on for years.  He was started on donepezil by primary care about a year ago and it was increased to 10 mg but then reduced back to 5 mg.  He is stable on the dose, he tolerates it. He stays compliant with his CPAP.  I reviewed his most recent compliance data from 01/08/2022 through 02/06/2022, during which time he used his machine 28 out of 30 days, of note, he was also hospitalized in the early part of that timeframe.  Average usage of 8 hours and 28 minutes, residual AHI at goal at 2.3/h, leak acceptable with the 95th percentile at 11.1 L/min on a pressure of 10 cm with EPR of 1.  I believe he is eligible for new machine and was supposed to get a new machine, we will check with his DME company, [employer].  I reviewed the hospital records.  He was admitted to the hospital from 01/08/2022 through 01/10/2022.  His brain MRI without contrast from 01/08/2022 showed: IMPRESSION: 1. 1.7 cm acute to early subacute ischemic nonhemorrhagic central cerebellar infarct. 2. Age-related cerebral atrophy with mild chronic small vessel ischemic disease.  He had a CT  angiogram of the head and neck with and without contrast on 01/09/2022 and I reviewed the results: IMPRESSION: CT HEAD IMPRESSION:   1. Previously identified cerebellar infarct not well visualized by CT. No intracranial hemorrhage. No other acute intracranial abnormality. 2. Underlying atrophy with chronic small vessel ischemic disease.   CTA HEAD AND NECK IMPRESSION:   1. Occlusion of the dominant mid-distal left V1 segment, age indeterminate, but could be acute in nature. Distal reconstitution at the left V2 segment via cervical collaterals. Additional extensive atheromatous change about the intracranial V4 segments with severe multifocal stenoses as above. 2. Severe 85% stenosis at the origin of the right subclavian artery. 3. Short-segment 50% stenosis about the distal cervical left ICA. 4. 70% atheromatous stenosis about the right carotid bulb. 5. Extensive atheromatous disease throughout the carotid siphons with associated moderate multifocal stenoses.  He had consultation with vascular surgery, no surgical intervention was recommended.  He was placed on aspirin and Plavix with the plan to continue dual antiplatelet medications for 3 months, then Plavix alone.  His echocardiogram from 01/09/2022 showed EF of 60 to 65%, normal LV function, no regional wall motion abnormalities, moderate left ventricular hypertrophy.  There was trivial mitral valve regurgitation, no evidence of mitral stenosis, no aortic stenosis.  There was borderline dilatation of the aortic root.  Blood work from 01/09/2022 showed an A1c of 5.6, lipid panel showed total cholesterol of 136, triglycerides  138, HDL 41, LDL 67, no new cholesterol medication was started for that reason.  His primary care physician has previously followed his carotid arteries with yearly Doppler ultrasounds.  He had a follow-up with his primary care physician recently since his hospital discharge on 01/17/2022.  I reviewed the office note.  He  was advised to continue with amlodipine and hydralazine but hold his losartan and Toprol for now. Previously:  He saw Debbora Presto, NP on 12/01/2021, at which time he was compliant with his CPAP of 10 cm with good apnea control.  He was advised to follow-up in sleep clinic in 1 year.  He saw Debbora Presto, NP on 12/01/2020, at which time he was compliant with his CPAP and doing well.  He was working part-time at [employer].  He saw Debbora Presto, NP on 09/04/2019, at which time he reported doing well with his CPAP therapy.  He was noted to have an elevated blood pressure at the time.  He had a telephone visit with Amy Lomax on 09/02/2018, at which time he reported doing well with his CPAP of 10 cm.  He saw Ward Givens, NP on 08/29/2017, at which time he was compliant with his CPAP of 10 cm, apnea control was good but wife had noticed residual snoring.  01/23/17 (SA): 88 year old right-handed gentleman with an underlying medical history of gout, reflux disease, diverticulitis, bilateral carotid artery stenosis, osteoarthritis, hypertension, skin cancer, history of osteomyelitis, hearing loss, seasonal allergies and obesity, who presents for follow-up consultation of his obstructive sleep apnea, after recent sleep study for re-evaluation. The patient is unaccompanied today. I first met him on 10/11/2016 at the request of his primary care physician, at which time he reported a prior diagnosis of obstructive sleep apnea and he had an older CPAP machine. He was advised to return for a repeat sleep study for requalification for sleep apnea treatment and a new CPAP machine. He had a split-night sleep study on 10/27/2016. I went over his test results with him in detail today. His baseline sleep efficiency was only 44.7%, REM sleep was absent during the first part of the study. Total AHI was 25.5 per hour, average oxygen saturation was 94%, nadir was 89%. He had moderate PLMS with moderate arousals. He was fitted with a nasal  mask. CPAP was titrated from 5 cm to 10 cm. On final pressure his AHI was 0 per hour, some supine REM sleep was achieved and O2 nadir was 93%. Average oxygen saturation was 95%, nadir was 89%. He had no significant PLMS during the second part of the study and during the titration portion of the study sleep efficiency was 80.8% REM percentage was 22.5%. Based on his test results I prescribed a new CPAP machine for him.   I reviewed his CPAP compliance data from 12/23/2016 through 01/21/2017 which is a total of 30 days, during which time he used his machine 29 days with percent used days greater than 4 hours at 97%, indicating excellent compliance with an average usage of 9 hours and 35 minutes, residual AHI at goal at 2.2 per hour, leak acceptable with the 95th percentile at 16.1 L/m on a pressure of 10 cm with EPR. He reports, he occasional wakes up with a dull headache. He limits his water intake after 8 PM. He does like to drink wine, wife gives him a hard time if he drinks more than 3 glasses. He is using a medium Eson nasal mask. Has occasional leg cramps, but tonic  water helps, no actual RLS symptoms.      10/11/2016: (He) was previously diagnosed with obstructive sleep apnea and placed on CPAP therapy years ago. Prior sleep study results are not available for my review today. He has an older CPAP machine, maybe 88 years old. A CPAP compliance download is reviewed today, from 09/11/2016 through 10/10/2016, which is a total of 30 days, pressure of 13.8, AHI of 2.1. This past month, leak low with the 95th percentile at 4.5 L/m. I reviewed your office note from 08/31/2016. His Epworth sleepiness score is 7 out of 24, fatigue score is 17 out of 63. He was diagnosed with OSA when he turned 67, but has had Sx of OSA way before then. Per patient, he had an AHI of about 40/hour. He moved from [place] in 12/17, as wife's family is here in [place], [place]. He has 2 step children, no biological kids. He retired from Administrator, arts job.  He has one dog. TV tends to stay on at night, but does not bother him. No telltale RLS symptoms, but was told he jerked a lot during sleep study.  His bedtime varies, usually between 8:30 and 11. His wake up time very is, usually between 5:30 AM to 7 AM. He does not have night to night nocturia. He has one sister who had sleep apnea. He has a total of 6 siblings. Lives with his wife. He is a nonsmoker, drinks alcohol daily about 2 glasses per evening, sometimes more on the weekends. He reports that he was a heavy drinker in the past, typically drinking liquor regularly. He does not drink much in the way of caffeine, usually one cup of coffee per day. He is fully compliant with CPAP machine. He would like to be able to get a new and updated machine.    His Past Medical History Is Significant For: Past Medical History:  Diagnosis Date   Actinic keratosis    Carotid stenosis, asymptomatic, bilateral 09/16/2016   Diffuse 1-39% bilaterally, but involving ICA, ECA, CCA - rpt 1 yr (06/4578)   Complication of anesthesia    slow to wake up after a colonoscopy years ago   Diverticulitis    GERD (gastroesophageal reflux disease)    Gout    History of osteomyelitis 10/2015   left foot   History of squamous cell carcinoma of skin    HTN (hypertension)    OSA on CPAP 09/01/2016   Osteoarthritis    back, neck, knees - multiple joints   Personal history of malignant neoplasm of larynx 2009   s/p XRT, Skin Cancer- "pre cancerous"   Pre-diabetes    Seasonal allergies    Sensorineural hearing loss (SNHL) of both ears 09/01/2016   Squamous cell carcinoma of skin unknown   Treated in [place] of [place] L post auricular     His Past Surgical History Is Significant For: Past Surgical History:  Procedure Laterality Date   ACHILLES TENDON REPAIR Left    AMPUTATION Left 05/16/2019   LEFT GREAT TOE AMPUTATION for osteomyelitis Sharol Given, Illene Regulus, MD), 2nd toe removed   APPENDECTOMY     COLONOSCOPY      THROAT SURGERY N/A 2009   ?vocal cord vs larynx nodule removal, had XRT after this but states it was not cancer   TONSILLECTOMY     TREATMENT FISTULA ANAL      His Family History Is Significant For: Family History  Problem Relation Age of Onset   Hypertension  Mother    Hypertension Father    Cancer Other        unknown - niece   CAD Sister 28       stent (80% blockage)   CAD Brother 68       stent (100% blockage)    His Social History Is Significant For: Social History   Socioeconomic History   Marital status: Married    Spouse name: Not on file   Number of children: Not on file   Years of education: Not on file   Highest education level: Bachelor's degree (e.g., BA, AB, BS)  Occupational History   Not on file  Tobacco Use   Smoking status: Never   Smokeless tobacco: Never  Vaping Use   Vaping Use: Never used  Substance and Sexual Activity   Alcohol use: Yes    Comment: occasional wine   Drug use: No   Sexual activity: Not on file  Other Topics Concern   Not on file  Social History Narrative   "[name]"   Widower. 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   Edu: college chemistry degree   Right handed   Caffeine: Drinks 1 cup of coffee every morning    Social Determinants of Radio broadcast assistant Strain: Low Risk  (02/11/2020)   Overall Financial Resource Strain (CARDIA)    Difficulty of Paying Living Expenses: Not hard at all  Food Insecurity: No Food Insecurity (02/11/2020)   Hunger Vital Sign    Worried About Running Out of Food in the Last Year: Never true    Ran Out of Food in the Last Year: Never true  Transportation Needs: No Transportation Needs (02/11/2020)   PRAPARE - Hydrologist (Medical): No    Lack of Transportation (Non-Medical): No  Physical Activity: Sufficiently Active (02/11/2020)   Exercise Vital Sign    Days of Exercise per Week: 7 days    Minutes of  Exercise per Session: 30 min  Stress: No Stress Concern Present (02/11/2020)   [place]    Feeling of Stress : Not at all  Social Connections: Not on file    His Allergies Are:  Allergies  Allergen Reactions   Lisinopril Cough  :   His Current Medications Are:  Outpatient Encounter Medications as of 02/07/2022  Medication Sig   acetaminophen (TYLENOL) 500 MG tablet Take 1-2 tablets (500-1,000 mg total) by mouth 2 (two) times daily as needed for moderate pain. (Patient taking differently: Take 500-1,000 mg by mouth 2 (two) times daily as needed for moderate pain. Pt states taking 3 tablets)   allopurinol (ZYLOPRIM) 100 MG tablet TAKE 2 TABLETS(200 MG) BY MOUTH DAILY   amLODipine (NORVASC) 10 MG tablet Take 1 tablet (10 mg total) by mouth daily.   aspirin EC 81 MG tablet Take 81 mg by mouth daily.   azelastine (ASTELIN) 0.1 % nasal spray Place 1-2 sprays into both nostrils 2 (two) times daily. Use in each nostril as directed   carboxymethylcellulose (REFRESH TEARS) 0.5 % SOLN Place 1 drop into both eyes daily as needed (dry eyes).   cetirizine (ZYRTEC) 10 MG tablet Take 10 mg by mouth daily. Alternates weekly with allegra.   clopidogrel (PLAVIX) 75 MG tablet Take 1 tablet (75 mg total) by mouth daily.   donepezil (ARICEPT) 5 MG tablet Take 1 tablet (5 mg total) by mouth at bedtime.  esomeprazole (NEXIUM) 20 MG capsule TAKE ONE CAPSULE BY MOUTH DAILY AT 12:00 PM   fexofenadine (ALLEGRA) 180 MG tablet Take 180 mg by mouth daily. Alternates weekly with zyrtec.   GLUCOSAMINE-CHONDROITIN PO Take 2 tablets by mouth daily.   hydrALAZINE (APRESOLINE) 25 MG tablet Take 1 tablet (25 mg total) by mouth 3 (three) times daily.   Magnesium 250 MG TABS Take 1 tablet (250 mg total) by mouth daily.   Multiple Vitamins-Minerals (MULTIVITAMIN ADULT PO) Take 1 tablet by mouth daily.   polyethylene glycol (MIRALAX / GLYCOLAX) packet Take 8.5  g by mouth daily.    No facility-administered encounter medications on file as of 02/07/2022.  :  Review of Systems:  Out of a complete 14 point review of systems, all are reviewed and negative with the exception of these symptoms as listed below:  Review of Systems  Neurological:        Patient is here with his wife for stroke follow-up. Wife states he is tired all of the time. He feels he is not as fast as he used to be. He saw cardiology today and they are going to do a heart monitor for two weeks.     Objective:  Neurological Exam  Physical Exam Physical Examination:   Vitals:   02/07/22 1503 02/07/22 1504  BP: (!) 161/64 (!) 148/64  Pulse: 64 (!) 51    General Examination: The patient is a very pleasant 88 y.o. male in no acute distress. He appears well-developed and well-nourished and well groomed.   HEENT: Normocephalic, atraumatic, pupils are equal, round and reactive to light, tracking well-preserved, no involuntary facial movements, no tremor, neck is supple, no rigidity, full range of motion, no carotid bruits.  Oropharynx exam reveals: Mild mouth dryness, adequate dental hygiene and moderate airway crowding. Tongue protrudes centrally and palate elevates symmetrically.     Chest: Clear to auscultation without wheezing, rhonchi or crackles noted.   Heart: S1+S2+0, regular and normal without murmurs, rubs or gallops noted.    Abdomen: Soft, non-tender and non-distended.   Extremities: There is trace to 1+ pitting edema in the distal lower extremities bilaterally. Pedal pulses are intact.   Skin: Warm and dry without trophic changes noted. Age related changes and changes in keeping with chronic sun exposure.   Musculoskeletal: exam reveals no obvious joint deformities, mild swelling left knee, reports having had arthroscopic knee surgery on the left.     Neurologically:  Mental status: The patient is awake, alert and oriented in all 4 spheres. His immediate and  remote memory, attention, language skills and fund of knowledge are appropriate. There is no evidence of aphasia, agnosia, apraxia or anomia. Speech is clear with normal prosody and enunciation. Thought process is linear. Mood is normal and affect is normal.  Cranial nerves II - XII are as described above under HEENT exam.  Motor exam: Normal bulk, strength and tone is noted. There is no drift, tremor or rebound. Fine motor skills and coordination: Grossly intact for age.  Normal finger taps, normal rapid alternating patting, normal foot taps. Cerebellar testing: No dysmetria or intention tremor. There is no truncal or gait ataxia.  Sensory exam: intact to light touch in the upper and lower extremities.  Gait, station and balance: He stands easily. No veering to one side is noted. No leaning to one side is noted. Posture is age-appropriate and stance is narrow based. Gait shows normal stride length and normal pace. No problems turning are noted.  No  gait ataxia.  No walking aid.   Assessment and Plan:    In summary, [name] is an 88 year old right-handed gentleman with an underlying medical history of sleep apnea, on CPAP therapy, gout, history of laryngeal cancer with status post XRT, carotid artery stenosis, bradycardia, hypertension, hyperlipidemia, reflux disease, prediabetes, and mild obesity, who presents for evaluation of his history of stroke.  He presented to the hospital a month ago with dizziness and was found to have a cerebellar stroke.  Stroke work-up was completed in the hospital.  He does have a history of carotid artery stenosis.  He is supposed to see vascular surgery.  He has recently seen cardiology.  He is supposed to have a heart monitor for 2 weeks, probably a Zio patch.  He does have a history of bradycardia off and on for years.  He has been in the mid to lower 50s for several years.  He started donepezil about a year ago but it did not actually cause of bradycardia although  I did tell the patient and his wife that sometimes donepezil can cause bradycardia or make it worse.  He was up to 10 mg daily but was then reduced to 5 mg daily which is now his current dose.  He has been on baby aspirin for years, he was started on Plavix in the hospital and the plan was to continue with aspirin and Plavix for a total of 3 months and then Plavix alone.  He is advised to stop the baby aspirin after about 3 months from his hospital stay.  He is advised to continue to follow-up with primary care and cardiology and his other specialist as scheduled/planned.  I did not suggest any new test from my end of things.  He has been compliant with CPAP therapy of 10 cm and continues to stay fully compliant.  He is commended for his treatment adherence.  He is feeling essentially back to baseline.  At this juncture, he is advised to follow-up routinely with Debbora Presto, NP for his sleep apnea next year.  I answered all the questions today, we talked about the importance of secondary stroke prevention today and fall prevention as well.  The patient and his wife are in agreement with our plan. I spent 45 minutes in total face-to-face time and in reviewing records during pre-charting, more than 50% of which was spent in counseling and coordination of care, reviewing test results, reviewing medications and treatment regimen and/or in discussing or reviewing the diagnosis of cerebellar stroke, the prognosis and treatment options. Pertinent laboratory and imaging test results that were available during this visit with the patient were reviewed by me and considered in my medical decision making (see chart for details).

## 2022-02-07 NOTE — Assessment & Plan Note (Signed)
History of essential hypertension a blood pressure measured today at 148/64.  He is on amlodipine and hydralazine.

## 2022-02-07 NOTE — Assessment & Plan Note (Signed)
History of bilateral carotid disease followed by Dr. Trula Slade which she is currently asymptomatic from.  He was also incidentally found to have right subclavian artery stenosis

## 2022-02-07 NOTE — Patient Instructions (Signed)
Testing/Procedures:  [facility]- Long Term Monitor Instructions  Your physician has requested you wear a ZIO patch monitor for 14 days.  This is a single patch monitor. [employer] supplies one patch monitor per enrollment. Additional stickers are not available. Please do not apply patch if you will be having a Nuclear Stress Test,  Echocardiogram, Cardiac CT, MRI, or Chest Xray during the period you would be wearing the  monitor. The patch cannot be worn during these tests. You cannot remove and re-apply the  ZIO XT patch monitor.  Your ZIO patch monitor will be mailed 3 day USPS to your address on file. It may take 3-5 days  to receive your monitor after you have been enrolled.  Once you have received your monitor, please review the enclosed instructions. Your monitor  has already been registered assigning a specific monitor serial # to you.  Billing and Patient Assistance Program Information  We have supplied [employer] with any of your insurance information on file for billing purposes. [employer] offers a sliding scale Patient Assistance Program for patients that do not have  insurance, or whose insurance does not completely cover the cost of the ZIO monitor.  You must apply for the Patient Assistance Program to qualify for this discounted rate.  To apply, please call [employer] at [phone], select option 4, select option 2, ask to apply for  Patient Assistance Program. Theodore Demark will ask your household income, and how many people  are in your household. They will quote your out-of-pocket cost based on that information.  [employer] will also be able to set up a 95-month interest-free payment plan if needed.  Applying the monitor   Shave hair from upper left chest.  Hold abrader disc by orange tab. Rub abrader in 40 strokes over the upper left chest as  indicated in your monitor instructions.  Clean area with 4 enclosed alcohol pads. Let dry.  Apply patch as indicated in monitor instructions.  Patch will be placed under collarbone on left  side of chest with arrow pointing upward.  Rub patch adhesive wings for 2 minutes. Remove white label marked "1". Remove the white  label marked "2". Rub patch adhesive wings for 2 additional minutes.  While looking in a mirror, press and release button in center of patch. A small green light will  flash 3-4 times. This will be your only indicator that the monitor has been turned on.  Do not shower for the first 24 hours. You may shower after the first 24 hours.  Press the button if you feel a symptom. You will hear a small click. Record Date, Time and  Symptom in the Patient Logbook.  When you are ready to remove the patch, follow instructions on the last 2 pages of Patient  Logbook. Stick patch monitor onto the last page of Patient Logbook.  Place Patient Logbook in the blue and white box. Use locking tab on box and tape box closed  securely. The blue and white box has prepaid postage on it. Please place it in the mailbox as  soon as possible. Your physician should have your test results approximately 7 days after the  monitor has [facility]  Call [facility] [phone] you have questions regarding  your ZIO XT patch monitor. Call them immediately if you see an orange light blinking on your  monitor.  If your monitor falls off in less than 4 days, contact our Monitor department at [phone]  If your monitor becomes loose or falls off after 4 days call [employer] at [phone] for  suggestions on securing your monitor    Follow-Up: At [facility], you and your health needs are our priority.  As part of our continuing mission to provide you with exceptional heart care, we have created designated Provider Care Teams.  These Care Teams include your primary Cardiologist (physician) and Advanced Practice Providers (APPs -  Physician Assistants and Nurse Practitioners) who all work  together to provide you with the care you need, when you need it.  We recommend signing up for the patient portal called "MyChart".  Sign up information is provided on this After Visit Summary.  MyChart is used to connect with patients for Virtual Visits (Telemedicine).  Patients are able to view lab/test results, encounter notes, upcoming appointments, etc.  Non-urgent messages can be sent to your provider as well.   To learn more about what you can do with MyChart, go to [email or web address].    Your next appointment:    AS NEEDED

## 2022-02-08 NOTE — Telephone Encounter (Signed)
[facility] responded and said they would look into this and call the patient today.

## 2022-02-09 ENCOUNTER — Telehealth: Payer: Self-pay | Admitting: Neurology

## 2022-02-09 ENCOUNTER — Telehealth: Payer: Self-pay | Admitting: Family Medicine

## 2022-02-09 NOTE — Telephone Encounter (Signed)
I called and LMVM for pt at the phone # listed. I relayed that per plavix refills, that Dr. Rexene Alberts feels that should come from your family doctor.  Call us back when you can.

## 2022-02-09 NOTE — Telephone Encounter (Signed)
Tried to call patient twice. It went straight to VM. I LVM asking for call back.

## 2022-02-09 NOTE — Telephone Encounter (Signed)
Pt's wife called stating pt has been out the meds, clopidogrel (PLAVIX) 75 MG tablet & no other dr has been able to refill meds for pt. Pt is requesting a refill is possible.   Caller Name: jakyron fabro Call back phone #: [phone]  MEDICATION(S):  clopidogrel (PLAVIX) 75 MG tablet  Days of Med Remaining:   Has the patient contacted their pharmacy (YES/NO)? YES What did pharmacy advise?  Suggested pt contact pcp to refill meds   Preferred Pharmacy:  [facility], [address]   ~~~Please advise patient/caregiver to allow 2-3 business days to process RX refills.

## 2022-02-09 NOTE — Telephone Encounter (Signed)
Pt is requesting a refill for clopidogrel (PLAVIX) 75 MG tablet.  Pharmacy: [facility] [phone]

## 2022-02-09 NOTE — Telephone Encounter (Signed)
Will call patient back. Dr Rexene Alberts says to have him obtain Plavix refills from his primary care provider.

## 2022-02-10 ENCOUNTER — Telehealth: Payer: Self-pay | Admitting: Family Medicine

## 2022-02-10 DIAGNOSIS — R001 Bradycardia, unspecified: Secondary | ICD-10-CM

## 2022-02-10 DIAGNOSIS — I4891 Unspecified atrial fibrillation: Secondary | ICD-10-CM | POA: Diagnosis not present

## 2022-02-10 DIAGNOSIS — I639 Cerebral infarction, unspecified: Secondary | ICD-10-CM

## 2022-02-10 MED ORDER — CLOPIDOGREL BISULFATE 75 MG PO TABS: 75.0000 mg | ORAL_TABLET | Freq: Every day | ORAL | 1 refills | Status: DC

## 2022-02-10 NOTE — Telephone Encounter (Signed)
Pt's wife asking for an update on med refill? Pt's wife stated pt cannot be off meds due to having a possible stroke. Call back # [phone].

## 2022-02-10 NOTE — Telephone Encounter (Signed)
Patient came by and dropped of forms to be filled out by Dr Shawn Route him to go back to work. Forms were left in Dr Darnell Level folder up front. Patient would like a phone call when forms are complete.

## 2022-02-10 NOTE — Telephone Encounter (Signed)
Noted. Agree with this. Thank you Rena for filling.

## 2022-02-10 NOTE — Telephone Encounter (Signed)
I spoke with Dr Darnell Level and he said could send in rx for Plavix 75 mg # 90 x1 . I spoke with pts wife (DPR signed) and she said pt is taking the ASA 81 mg and Plavix 75 mg daily for 3 months after stroke and then will transition to just Plavix 75 mg po daily. Sending refill to [facility] [address] as requested. Pt already has FU appt with Dr Darnell Level on 03/06/22 and wife said pt will keep that appt. Pts wife appreciative of refill of Plavix; pt has been off Plavix for 3 days. Pt will restart plavix today. Sending FYI to Dr Darnell Level.

## 2022-02-13 DIAGNOSIS — Z0279 Encounter for issue of other medical certificate: Secondary | ICD-10-CM

## 2022-02-13 NOTE — Telephone Encounter (Signed)
Paperwork given to [employer].

## 2022-02-13 NOTE — Telephone Encounter (Signed)
Filled out and in Lisa's box.  

## 2022-02-13 NOTE — Telephone Encounter (Signed)
FMLA form from patient has been placed in your inbox for completion and signing.  This is for patient's absence from work starting on 01/08/22.  He was told to stay out of work at least until he gets his heart monitor off on 03/09/22.  He will have follow up appts, as needed.  In six months, he has an appt with a vascular surgeon.  This is due ASAP.  When paperwork is complete, call Vaughan Basta, patient's wife, to notify.  They will pick up copy of form.

## 2022-02-14 DIAGNOSIS — H353131 Nonexudative age-related macular degeneration, bilateral, early dry stage: Secondary | ICD-10-CM | POA: Diagnosis not present

## 2022-02-14 NOTE — Telephone Encounter (Signed)
Completed FMLA form has been faxed to fax# [phone].  Received fax confirmation.  Copies have been made for billing, scanning, patient, and myself.  Maylon Peppers, patient's wife, to notify her that patient's copy has been placed in front office for pickup at their convenience.

## 2022-02-16 ENCOUNTER — Telehealth: Payer: Self-pay | Admitting: Family Medicine

## 2022-02-16 NOTE — Telephone Encounter (Signed)
Left message for patient to call back and schedule Medicare Annual Wellness Visit (AWV).   Please offer to do virtually or by telephone.   Last AWV:02/17/21  Please schedule at anytime with [facility] schedule   45 minute appointent  If any questions, please contact me at [phone]

## 2022-02-22 ENCOUNTER — Ambulatory Visit (INDEPENDENT_AMBULATORY_CARE_PROVIDER_SITE_OTHER): Payer: Medicare Other

## 2022-02-22 VITALS — Ht 69.5 in | Wt 223.0 lb

## 2022-02-22 DIAGNOSIS — Z0000 Encounter for general adult medical examination without abnormal findings: Secondary | ICD-10-CM | POA: Diagnosis not present

## 2022-02-22 NOTE — Progress Notes (Signed)
Subjective:   [name] is a 88 y.o. male who presents for Medicare Annual/Subsequent preventive examination.  Review of Systems    No ROS.  Medicare Wellness Virtual Visit.  Visual/audio telehealth visit, UTA vital signs.   See social history for additional risk factors.   Cardiac Risk Factors include: advanced age (>25mn, >>36women);male gender     Objective:    Today's Vitals   02/22/22 0836  Weight: 223 lb (101.2 kg)  Height: 5' 9.5" (1.765 m)   Body mass index is 32.46 kg/m.     02/22/2022    8:39 AM 01/09/2022    9:00 AM 01/09/2022   12:48 AM 01/08/2022    2:02 PM 05/20/2021    8:20 AM 02/11/2020   12:06 PM 05/16/2019    6:57 AM  Advanced Directives  Does Patient Have a Medical Advance Directive? Yes No No No No No No  Type of [place]ive [place]       Does patient want to make changes to medical advance directive? No - Patient declined        Would patient like information on creating a medical advance directive?  No - Patient declined No - Patient declined  No - Patient declined No - Patient declined     Current Medications (verified) Outpatient Encounter Medications as of 02/22/2022  Medication Sig   acetaminophen (TYLENOL) 500 MG tablet Take 1-2 tablets (500-1,000 mg total) by mouth 2 (two) times daily as needed for moderate pain. (Patient taking differently: Take 500-1,000 mg by mouth 2 (two) times daily as needed for moderate pain. Pt states taking 3 tablets)   allopurinol (ZYLOPRIM) 100 MG tablet TAKE 2 TABLETS(200 MG) BY MOUTH DAILY   amLODipine (NORVASC) 10 MG tablet Take 1 tablet (10 mg total) by mouth daily.   aspirin EC 81 MG tablet Take 81 mg by mouth daily.   azelastine (ASTELIN) 0.1 % nasal spray Place 1-2 sprays into both nostrils 2 (two) times daily. Use in each nostril as directed   carboxymethylcellulose (REFRESH TEARS) 0.5 % SOLN Place 1 drop into both eyes daily as needed (dry eyes).   cetirizine (ZYRTEC) 10 MG  tablet Take 10 mg by mouth daily. Alternates weekly with allegra.   clopidogrel (PLAVIX) 75 MG tablet Take 1 tablet (75 mg total) by mouth daily.   donepezil (ARICEPT) 5 MG tablet Take 1 tablet (5 mg total) by mouth at bedtime.   esomeprazole (NEXIUM) 20 MG capsule TAKE ONE CAPSULE BY MOUTH DAILY AT 12:00 PM   fexofenadine (ALLEGRA) 180 MG tablet Take 180 mg by mouth daily. Alternates weekly with zyrtec.   GLUCOSAMINE-CHONDROITIN PO Take 2 tablets by mouth daily.   hydrALAZINE (APRESOLINE) 25 MG tablet Take 1 tablet (25 mg total) by mouth 3 (three) times daily.   Magnesium 250 MG TABS Take 1 tablet (250 mg total) by mouth daily.   Multiple Vitamins-Minerals (MULTIVITAMIN ADULT PO) Take 1 tablet by mouth daily.   polyethylene glycol (MIRALAX / GLYCOLAX) packet Take 8.5 g by mouth daily.    No facility-administered encounter medications on file as of 02/22/2022.    Allergies (verified) Lisinopril   History: Past Medical History:  Diagnosis Date   Actinic keratosis    Carotid stenosis, asymptomatic, bilateral 09/16/2016   Diffuse 1-39% bilaterally, but involving ICA, ECA, CCA - rpt 1 yr (56/7619   Complication of anesthesia    slow to wake up after a colonoscopy years ago   Diverticulitis  GERD (gastroesophageal reflux disease)    Gout    History of osteomyelitis 10/2015   left foot   History of squamous cell carcinoma of skin    HTN (hypertension)    OSA on CPAP 09/01/2016   Osteoarthritis    back, neck, knees - multiple joints   Personal history of malignant neoplasm of larynx 2009   s/p XRT, Skin Cancer- "pre cancerous"   Pre-diabetes    Seasonal allergies    Sensorineural hearing loss (SNHL) of both ears 09/01/2016   Squamous cell carcinoma of skin unknown   Treated in [place] [facility] post auricular    Past Surgical History:  Procedure Laterality Date   ACHILLES TENDON REPAIR Left    AMPUTATION Left 05/16/2019   LEFT GREAT TOE AMPUTATION for osteomyelitis Sharol Given,  Illene Regulus, MD), 2nd toe removed   APPENDECTOMY     COLONOSCOPY     THROAT SURGERY N/A 2009   ?vocal cord vs larynx nodule removal, had XRT after this but states it was not cancer   TONSILLECTOMY     TREATMENT FISTULA ANAL     Family History  Problem Relation Age of Onset   Hypertension Mother    Hypertension Father    Cancer Other        unknown - niece   CAD Sister 44       stent (80% blockage)   CAD Brother 30       stent (100% blockage)   Social History   Socioeconomic History   Marital status: Married    Spouse name: Not on file   Number of children: Not on file   Years of education: Not on file   Highest education level: Bachelor's degree (e.g., BA, AB, BS)  Occupational History   Not on file  Tobacco Use   Smoking status: Never   Smokeless tobacco: Never  Vaping Use   Vaping Use: Never used  Substance and Sexual Activity   Alcohol use: Yes    Comment: occasional wine   Drug use: No   Sexual activity: Not on file  Other Topics Concern   Not on file  Social History Narrative   "[name]"   Widower. 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   Edu: college chemistry degree   Right handed   Caffeine: Drinks 1 cup of coffee every morning    Social Determinants of Radio broadcast assistant Strain: Low Risk  (02/22/2022)   Overall Financial Resource Strain (CARDIA)    Difficulty of Paying Living Expenses: Not hard at all  Food Insecurity: No Food Insecurity (02/22/2022)   Hunger Vital Sign    Worried About Running Out of Food in the Last Year: Never true    Ran Out of Food in the Last Year: Never true  Transportation Needs: No Transportation Needs (02/22/2022)   PRAPARE - Hydrologist (Medical): No    Lack of Transportation (Non-Medical): No  Physical Activity: Sufficiently Active (02/22/2022)   Exercise Vital Sign    Days of Exercise per Week: 7 days    Minutes of Exercise per  Session: 30 min  Stress: No Stress Concern Present (02/22/2022)   [place]    Feeling of Stress : Not at all  Social Connections: Unknown (02/22/2022)   Social Connection and Isolation Panel [NHANES]    Frequency of Communication with Friends  and Family: Not on file    Frequency of Social Gatherings with Friends and Family: Not on file    Attends Religious Services: Not on file    Active Member of Clubs or Organizations: Not on file    Attends Archivist Meetings: Not on file    Marital Status: Married    Tobacco Counseling Counseling given: Not Answered   Clinical Intake:  Pre-visit preparation completed: Yes        Diabetes: No  How often do you need to have someone help you when you read instructions, pamphlets, or other written materials from your doctor or pharmacy?: 2 - Rarely    Interpreter Needed?: No      Activities of Daily Living    02/22/2022    8:39 AM 01/09/2022    9:00 AM  In your present state of health, do you have any difficulty performing the following activities:  Hearing? 1 0  Comment Hearing aids   Vision? 0 0  Difficulty concentrating or making decisions? 0 0  Walking or climbing stairs? 0 0  Dressing or bathing? 0 0  Doing errands, shopping? 0 0  Preparing Food and eating ? N   Using the Toilet? N   In the past six months, have you accidently leaked urine? N   Do you have problems with loss of bowel control? N   Managing your Medications? N   Managing your Finances? N   Housekeeping or managing your Housekeeping? N     Patient Care Team: Ria Bush, MD as PCP - General (Family Medicine)  Indicate any recent Medical Services you may have received from other than [facility] providers in the past year (date may be approximate).     Assessment:   This is a routine wellness examination for [place].  I connected with  Elnoria Howard on 02/22/22 by a  audio enabled telemedicine application and verified that I am speaking with the correct person using two identifiers.  Patient Location: Home  Provider Location: Office/Clinic  I discussed the limitations of evaluation and management by telemedicine. The patient expressed understanding and agreed to proceed.   Hearing/Vision screen Hearing Screening - Comments:: Wears bilateral hearing aids Vision Screening - Comments:: Followed by [facility] Cataract extraction, bilateral They have seen their ophthalmologist in the last 12 months.    Dietary issues and exercise activities discussed: Current Exercise Habits: Home exercise routine, Type of exercise: walking, Time (Minutes): 30, Frequency (Times/Week): 7, Weekly Exercise (Minutes/Week): 210, Intensity: Moderate  Healthy/regular diet   Goals Addressed             This Visit's Progress    DIET - INCREASE WATER INTAKE   On track    Starting 08/28/2017, I will attempt to drink at least 6-8 glasses of water daily.      Increase physical activity   On track    When weather permits, I will resume walking at least 1 mile in morning.      Patient Stated   On track    02/11/2020, I will continue to walk daily for 30 minutes.        Depression Screen    02/22/2022    8:39 AM 02/16/2021    2:52 PM 05/19/2020    8:16 AM 02/11/2020   12:08 PM 02/12/2019   12:38 PM 08/28/2017   12:01 PM 08/23/2016   11:08 AM  PHQ 2/9 Scores  PHQ - 2 Score 0 0 0 0 0 0  0  PHQ- 9 Score    0  0     Fall Risk    02/22/2022    8:39 AM 02/16/2021    2:51 PM 05/19/2020    8:16 AM 02/11/2020   12:07 PM 02/12/2019   12:38 PM  Fall Risk   Falls in the past year? 0 0 0 0 0  Number falls in past yr: 0  0 0   Injury with Fall? 0  0 0   Risk for fall due to : No Fall Risks   Medication side effect   Follow up Falls evaluation completed  Falls evaluation completed Falls prevention discussed;Falls evaluation completed     FALL RISK PREVENTION  PERTAINING TO THE HOME: Home free of loose throw rugs in walkways, pet beds, electrical cords, etc? Yes  Adequate lighting in your home to reduce risk of falls? Yes   ASSISTIVE DEVICES UTILIZED TO PREVENT FALLS: Life alert? No  Use of a cane, walker or w/c? No  Grab bars in the bathroom? No  Shower chair or bench in shower? No  Elevated toilet seat or a handicapped toilet? No   TIMED UP AND GO: Was the test performed? No .   Cognitive Function:    02/11/2020   12:12 PM 08/28/2017   12:02 PM 08/23/2016   11:10 AM  MMSE - Mini Mental State Exam  Orientation to time '5 5 5  '$ Orientation to Place '5 5 5  '$ Registration '3 3 3  '$ Attention/ Calculation 5 0 0  Recall '1 2 3  '$ Recall-comments  unable to recall 1 of 3 words   Language- name 2 objects  0 0  Language- repeat '1 1 1  '$ Language- follow 3 step command  3 3  Language- read & follow direction  0 0  Write a sentence  0 0  Copy design  0 0  Total score  19 20        02/22/2022    8:49 AM  6CIT Screen  What Year? 0 points  What month? 0 points  What time? 0 points  Count back from 20 0 points  Months in reverse 0 points  Repeat phrase 4 points  Total Score 4 points    Immunizations Immunization History  Administered Date(s) Administered   Fluad Quad(high Dose 65+) 02/17/2020   Hepatitis A 12/06/2012   Influenza, High Dose Seasonal PF 02/11/2018, 01/16/2019, 01/20/2021   Influenza-Unspecified 01/19/2016, 01/30/2017   PFIZER(Purple Top)SARS-COV-2 Vaccination 05/26/2019, 06/16/2019, 02/06/2020   Pfizer Covid-19 Vaccine Bivalent Booster 16yr & up 02/01/2021   Pneumococcal Conjugate-13 07/21/2014   Pneumococcal Polysaccharide-23 09/09/2010   Td 08/06/2004   Tdap 09/10/2012   Zoster, Live 02/06/2008   Covid-19 vaccine status: Completed vaccines x4  Shingrix Completed?: No.    Education has been provided regarding the importance of this vaccine. Patient has been advised to call insurance company to determine out of pocket  expense if they have not yet received this vaccine. Advised may also receive vaccine at local pharmacy or Health Dept. Verbalized acceptance and understanding.  Screening Tests Health Maintenance  Topic Date Due   COVID-19 Vaccine (5 - Pfizer risk series) 03/10/2022 (Originally 03/29/2021)   Zoster Vaccines- Shingrix (1 of 2) 05/25/2022 (Originally 12/22/1950)   INFLUENZA VACCINE  08/06/2022 (Originally 12/06/2021)   TETANUS/TDAP  09/11/2022   Pneumonia Vaccine 88 Years old  Completed   HPV VACCINES  Aged Out   Health Maintenance There are no preventive care reminders to display for this  patient.  Lung Cancer Screening: (Low Dose CT Chest recommended if Age 27-80 years, 30 pack-year currently smoking OR have quit w/in 15years.) does not qualify.   Hepatitis C Screening: does not qualify.  Vision Screening: Recommended annual ophthalmology exams for early detection of glaucoma and other disorders of the eye.  Dental Screening: Recommended annual dental exams for proper oral hygiene  Community Resource Referral / Chronic Care Management: CRR required this visit?  No   CCM required this visit?  No      Plan:     I have personally reviewed and noted the following in the patient's chart:   Medical and social history Use of alcohol, tobacco or illicit drugs  Current medications and supplements including opioid prescriptions. Patient is not currently taking opioid prescriptions. Functional ability and status Nutritional status Physical activity Advanced directives List of other physicians Hospitalizations, surgeries, and ER visits in previous 12 months Vitals Screenings to include cognitive, depression, and falls Referrals and appointments  In addition, I have reviewed and discussed with patient certain preventive protocols, quality metrics, and best practice recommendations. A written personalized care plan for preventive services as well as general preventive health  recommendations were provided to patient.     Leta Jungling, LPN   15/83/0940

## 2022-02-22 NOTE — Patient Instructions (Addendum)
[name] , Thank you for taking time to come for your Medicare Wellness Visit. I appreciate your ongoing commitment to your health goals. Please review the following plan we discussed and let me know if I can assist you in the future.   These are the goals we discussed:  Goals      DIET - INCREASE WATER INTAKE     Starting 08/28/2017, I will attempt to drink at least 6-8 glasses of water daily.      Increase physical activity     When weather permits, I will resume walking at least 1 mile in morning.      Patient Stated     02/11/2020, I will continue to walk daily for 30 minutes.         This is a list of the screening recommended for you and due dates:  Health Maintenance  Topic Date Due   COVID-19 Vaccine (5 - Pfizer risk series) 03/10/2022*   Zoster (Shingles) Vaccine (1 of 2) 05/25/2022*   Flu Shot  08/06/2022*   Tetanus Vaccine  09/11/2022   Pneumonia Vaccine  Completed   HPV Vaccine  Aged Out  *Topic was postponed. The date shown is not the original due date.    Advanced directives: End of life planning; Advance aging; Advanced directives discussed.  Copy of current HCPOA/Living Will requested.    Conditions/risks identified: none new  Next appointment: Follow up in one year for your annual wellness visit.   Preventive Care 86 Years and Older, Male  Preventive care refers to lifestyle choices and visits with your health care provider that can promote health and wellness. What does preventive care include? A yearly physical exam. This is also called an annual well check. Dental exams once or twice a year. Routine eye exams. Ask your health care provider how often you should have your eyes checked. Personal lifestyle choices, including: Daily care of your teeth and gums. Regular physical activity. Eating a healthy diet. Avoiding tobacco and drug use. Limiting alcohol use. Practicing safe sex. Taking low doses of aspirin every day. Taking vitamin and mineral  supplements as recommended by your health care provider. What happens during an annual well check? The services and screenings done by your health care provider during your annual well check will depend on your age, overall health, lifestyle risk factors, and family history of disease. Counseling  Your health care provider may ask you questions about your: Alcohol use. Tobacco use. Drug use. Emotional well-being. Home and relationship well-being. Sexual activity. Eating habits. History of falls. Memory and ability to understand (cognition). Work and work Statistician. Screening  You may have the following tests or measurements: Height, weight, and BMI. Blood pressure. Lipid and cholesterol levels. These may be checked every 5 years, or more frequently if you are over 33 years old. Skin check. Lung cancer screening. You may have this screening every year starting at age 43 if you have a 30-pack-year history of smoking and currently smoke or have quit within the past 15 years. Fecal occult blood test (FOBT) of the stool. You may have this test every year starting at age 14. Flexible sigmoidoscopy or colonoscopy. You may have a sigmoidoscopy every 5 years or a colonoscopy every 10 years starting at age 44. Prostate cancer screening. Recommendations will vary depending on your family history and other risks. Hepatitis C blood test. Hepatitis B blood test. Sexually transmitted disease (STD) testing. Diabetes screening. This is done by checking your blood sugar (glucose)  after you have not eaten for a while (fasting). You may have this done every 1-3 years. Abdominal aortic aneurysm (AAA) screening. You may need this if you are a current or former smoker. Osteoporosis. You may be screened starting at age 68 if you are at high risk. Talk with your health care provider about your test results, treatment options, and if necessary, the need for more tests. Vaccines  Your health care provider  may recommend certain vaccines, such as: Influenza vaccine. This is recommended every year. Tetanus, diphtheria, and acellular pertussis (Tdap, Td) vaccine. You may need a Td booster every 10 years. Zoster vaccine. You may need this after age 18. Pneumococcal 13-valent conjugate (PCV13) vaccine. One dose is recommended after age 7. Pneumococcal polysaccharide (PPSV23) vaccine. One dose is recommended after age 59. Talk to your health care provider about which screenings and vaccines you need and how often you need them. This information is not intended to replace advice given to you by your health care provider. Make sure you discuss any questions you have with your health care provider. Document Released: 05/21/2015 Document Revised: 01/12/2016 Document Reviewed: 02/23/2015 Elsevier Interactive Patient Education  2017 [place] Prevention in the Home Falls can cause injuries. They can happen to people of all ages. There are many things you can do to make your home safe and to help prevent falls. What can I do on the outside of my home? Regularly fix the edges of walkways and driveways and fix any cracks. Remove anything that might make you trip as you walk through a door, such as a raised step or threshold. Trim any bushes or trees on the path to your home. Use bright outdoor lighting. Clear any walking paths of anything that might make someone trip, such as rocks or tools. Regularly check to see if handrails are loose or broken. Make sure that both sides of any steps have handrails. Any raised decks and porches should have guardrails on the edges. Have any leaves, snow, or ice cleared regularly. Use sand or salt on walking paths during winter. Clean up any spills in your garage right away. This includes oil or grease spills. What can I do in the bathroom? Use night lights. Install grab bars by the toilet and in the tub and shower. Do not use towel bars as grab bars. Use  non-skid mats or decals in the tub or shower. If you need to sit down in the shower, use a plastic, non-slip stool. Keep the floor dry. Clean up any water that spills on the floor as soon as it happens. Remove soap buildup in the tub or shower regularly. Attach bath mats securely with double-sided non-slip rug tape. Do not have throw rugs and other things on the floor that can make you trip. What can I do in the bedroom? Use night lights. Make sure that you have a light by your bed that is easy to reach. Do not use any sheets or blankets that are too big for your bed. They should not hang down onto the floor. Have a firm chair that has side arms. You can use this for support while you get dressed. Do not have throw rugs and other things on the floor that can make you trip. What can I do in the kitchen? Clean up any spills right away. Avoid walking on wet floors. Keep items that you use a lot in easy-to-reach places. If you need to reach something above you, use a  strong step stool that has a grab bar. Keep electrical cords out of the way. Do not use floor polish or wax that makes floors slippery. If you must use wax, use non-skid floor wax. Do not have throw rugs and other things on the floor that can make you trip. What can I do with my stairs? Do not leave any items on the stairs. Make sure that there are handrails on both sides of the stairs and use them. Fix handrails that are broken or loose. Make sure that handrails are as long as the stairways. Check any carpeting to make sure that it is firmly attached to the stairs. Fix any carpet that is loose or worn. Avoid having throw rugs at the top or bottom of the stairs. If you do have throw rugs, attach them to the floor with carpet tape. Make sure that you have a light switch at the top of the stairs and the bottom of the stairs. If you do not have them, ask someone to add them for you. What else can I do to help prevent falls? Wear  shoes that: Do not have high heels. Have rubber bottoms. Are comfortable and fit you well. Are closed at the toe. Do not wear sandals. If you use a stepladder: Make sure that it is fully opened. Do not climb a closed stepladder. Make sure that both sides of the stepladder are locked into place. Ask someone to hold it for you, if possible. Clearly mark and make sure that you can see: Any grab bars or handrails. First and last steps. Where the edge of each step is. Use tools that help you move around (mobility aids) if they are needed. These include: Canes. Walkers. Scooters. Crutches. Turn on the lights when you go into a dark area. Replace any light bulbs as soon as they burn out. Set up your furniture so you have a clear path. Avoid moving your furniture around. If any of your floors are uneven, fix them. If there are any pets around you, be aware of where they are. Review your medicines with your doctor. Some medicines can make you feel dizzy. This can increase your chance of falling. Ask your doctor what other things that you can do to help prevent falls. This information is not intended to replace advice given to you by your health care provider. Make sure you discuss any questions you have with your health care provider. Document Released: 02/18/2009 Document Revised: 09/30/2015 Document Reviewed: 05/29/2014 Elsevier Interactive Patient Education  2017 [employer].

## 2022-02-23 ENCOUNTER — Other Ambulatory Visit: Payer: Self-pay | Admitting: Family Medicine

## 2022-02-23 DIAGNOSIS — K219 Gastro-esophageal reflux disease without esophagitis: Secondary | ICD-10-CM

## 2022-02-24 DIAGNOSIS — H1132 Conjunctival hemorrhage, left eye: Secondary | ICD-10-CM | POA: Diagnosis not present

## 2022-03-01 DIAGNOSIS — I4891 Unspecified atrial fibrillation: Secondary | ICD-10-CM | POA: Diagnosis not present

## 2022-03-01 DIAGNOSIS — I639 Cerebral infarction, unspecified: Secondary | ICD-10-CM | POA: Diagnosis not present

## 2022-03-06 ENCOUNTER — Ambulatory Visit (INDEPENDENT_AMBULATORY_CARE_PROVIDER_SITE_OTHER): Payer: Medicare Other | Admitting: Family Medicine

## 2022-03-06 ENCOUNTER — Encounter: Payer: Self-pay | Admitting: Family Medicine

## 2022-03-06 VITALS — BP 142/62 | HR 53 | Temp 97.7°F | Ht 67.5 in | Wt 217.2 lb

## 2022-03-06 DIAGNOSIS — R7303 Prediabetes: Secondary | ICD-10-CM

## 2022-03-06 DIAGNOSIS — I6523 Occlusion and stenosis of bilateral carotid arteries: Secondary | ICD-10-CM

## 2022-03-06 DIAGNOSIS — M1A00X0 Idiopathic chronic gout, unspecified site, without tophus (tophi): Secondary | ICD-10-CM

## 2022-03-06 DIAGNOSIS — Z23 Encounter for immunization: Secondary | ICD-10-CM | POA: Diagnosis not present

## 2022-03-06 DIAGNOSIS — E785 Hyperlipidemia, unspecified: Secondary | ICD-10-CM | POA: Diagnosis not present

## 2022-03-06 DIAGNOSIS — I10 Essential (primary) hypertension: Secondary | ICD-10-CM

## 2022-03-06 DIAGNOSIS — Z0000 Encounter for general adult medical examination without abnormal findings: Secondary | ICD-10-CM | POA: Diagnosis not present

## 2022-03-06 DIAGNOSIS — K219 Gastro-esophageal reflux disease without esophagitis: Secondary | ICD-10-CM

## 2022-03-06 DIAGNOSIS — D649 Anemia, unspecified: Secondary | ICD-10-CM

## 2022-03-06 DIAGNOSIS — E66811 Obesity, class 1: Secondary | ICD-10-CM

## 2022-03-06 DIAGNOSIS — Z8673 Personal history of transient ischemic attack (TIA), and cerebral infarction without residual deficits: Secondary | ICD-10-CM

## 2022-03-06 DIAGNOSIS — Z7189 Other specified counseling: Secondary | ICD-10-CM | POA: Diagnosis not present

## 2022-03-06 DIAGNOSIS — G4733 Obstructive sleep apnea (adult) (pediatric): Secondary | ICD-10-CM

## 2022-03-06 DIAGNOSIS — S98131A Complete traumatic amputation of one right lesser toe, initial encounter: Secondary | ICD-10-CM

## 2022-03-06 DIAGNOSIS — E669 Obesity, unspecified: Secondary | ICD-10-CM

## 2022-03-06 LAB — COMPREHENSIVE METABOLIC PANEL
ALT: 17 U/L (ref 0–53)
AST: 22 U/L (ref 0–37)
Albumin: 4.2 g/dL (ref 3.5–5.2)
Alkaline Phosphatase: 50 U/L (ref 39–117)
BUN: 23 mg/dL (ref 6–23)
CO2: 27 mEq/L (ref 19–32)
Calcium: 9.6 mg/dL (ref 8.4–10.5)
Chloride: 103 mEq/L (ref 96–112)
Creatinine, Ser: 1.07 mg/dL (ref 0.40–1.50)
GFR: 62.09 mL/min (ref >60.00)
Glucose, Bld: 94 mg/dL (ref 70–99)
Potassium: 4.6 mEq/L (ref 3.5–5.1)
Sodium: 138 mEq/L (ref 135–145)
Total Bilirubin: 0.5 mg/dL (ref 0.2–1.2)
Total Protein: 7 g/dL (ref 6.0–8.3)

## 2022-03-06 LAB — CBC WITH DIFFERENTIAL/PLATELET
Basophils Absolute: 0 10*3/uL (ref 0.0–0.1)
Basophils Relative: 0.6 % (ref 0.0–3.0)
Eosinophils Absolute: 0.1 10*3/uL (ref 0.0–0.7)
Eosinophils Relative: 2.8 % (ref 0.0–5.0)
HCT: 39.4 % (ref 39.0–52.0)
Hemoglobin: 13.2 g/dL (ref 13.0–17.0)
Lymphocytes Relative: 27.7 % (ref 12.0–46.0)
Lymphs Abs: 1.4 10*3/uL (ref 0.7–4.0)
MCHC: 33.5 g/dL (ref 30.0–36.0)
MCV: 96.2 fl (ref 78.0–100.0)
Monocytes Absolute: 0.5 10*3/uL (ref 0.1–1.0)
Monocytes Relative: 9.9 % (ref 3.0–12.0)
Neutro Abs: 3 10*3/uL (ref 1.4–7.7)
Neutrophils Relative %: 59 % (ref 43.0–77.0)
Platelets: 239 10*3/uL (ref 150.0–400.0)
RBC: 4.09 Mil/uL — ABNORMAL LOW (ref 4.22–5.81)
RDW: 14.4 % (ref 11.5–15.5)
WBC: 5.2 10*3/uL (ref 4.0–10.5)

## 2022-03-06 LAB — URIC ACID: Uric Acid, Serum: 3.8 mg/dL — ABNORMAL LOW (ref 4.0–7.8)

## 2022-03-06 NOTE — Assessment & Plan Note (Addendum)
No recent gout flare on allopurinol '200mg'$  daily

## 2022-03-06 NOTE — Progress Notes (Signed)
Patient ID: [name], male    DOB: [birth year], 88 y.o.   MRN: [id number]  This visit was conducted in person.  BP (!) 142/62   Pulse (!) 53   Temp 97.7 F (36.5 C) (Temporal)   Ht 5' 7.5" (1.715 m)   Wt 217 lb 4 oz (98.5 kg)   SpO2 95%   BMI 33.52 kg/m   BP Readings from Last 3 Encounters:  03/06/22 (!) 142/62  02/07/22 (!) 148/64  02/07/22 (!) 148/64  164/60 on repeat testing.  CC: CPE Subjective:   HPI: [name] is a 88 y.o. male presenting on 03/06/2022 for Annual Exam (MCR prt 2.)   Saw health advisor last week for medicare wellness visit. Note reviewed.    No results found.  Flowsheet Row Clinical Support from 02/22/2022 in [facility] at [facility]  PHQ-2 Total Score 0          02/22/2022    8:39 AM 02/16/2021    2:51 PM 05/19/2020    8:16 AM 02/11/2020   12:07 PM 02/12/2019   12:38 PM  Fall Risk   Falls in the past year? 0 0 0 0 0  Number falls in past yr: 0  0 0   Injury with Fall? 0  0 0   Risk for fall due to : No Fall Risks   Medication side effect   Follow up Falls evaluation completed  Falls evaluation completed Falls prevention discussed;Falls evaluation completed     "[name]" Continues working as Systems analyst. 6 lb weight loss with increased activity at work.  OSA on CPAP followed by [facility].   Recent acute stroke presenting with imbalance/dizziness - brain imaging showed ischemic nonhemorrhagic infarct of central cerebellum - planned DAPT x3 months then plavix alone. Found to have RICA 40%, LICA 98% stenosis, as well as R subclavian stenosis 85% at origin - thought noncontributory.  Saw cardiology and neurology early this month, notes reviewed.   Home BP readings - forgot log   BP regimen - amlodipine '10mg'$  daily, hydralazine '25mg'$  TID.  Avoiding BB in setting of bradycardia.   Requests form clearing him for return to work, max 5 hours.   Preventative: Colon cancer screening - normal colonoscopies, last  ~2009 - aged out Prostate cancer screening - age out  Lung cancer screening - not eligible  Flu shot - yearly  [place] 06/2019, 07/2019, booster 02/2020, bivalent booster 01/2021 Tdap 2014 Pneumovax 2012, Prevnar-13 2016 zostavax 2009 shingrix - discussed, to check at pharmacy  Advanced directive discussion - needs updating. Would want wife to be HCPOA. Packet previously provided.  Seat belt use discussed.  Sunscreen use discussed. No changing moles on skin. Sees derm tomorrow.  Non smoker. Significant second hand smoker.  Alcohol - was heavy drinker, has cut down significantly - 1 beer/month  Dentist q6 mo  Eye exam yearly ([place])  Bowel - constipation managed with miralax  Bladder - no incontinence, he's cut down on caffeine intake.   Widower. Second marriage to Bear: retired [place]: college chemistry degree - going to real estate school Right handed Activity: walking 9/10th mile daily  Caffeine: Drinks 1 cup of coffee every morning      Relevant past medical, surgical, family and social history reviewed and updated as indicated. Interim medical history since our last visit reviewed. Allergies and medications reviewed and updated. Outpatient Medications Prior to Visit  Medication Sig Dispense Refill   acetaminophen (TYLENOL) 500 MG tablet Take 1-2 tablets (500-1,000 mg total) by mouth 2 (two) times daily as needed for moderate pain. (Patient taking differently: Take 500-1,000 mg by mouth 2 (two) times daily as needed for moderate pain. Pt states taking 3 tablets)     allopurinol (ZYLOPRIM) 100 MG tablet TAKE 2 TABLETS(200 MG) BY MOUTH DAILY 180 tablet 1   amLODipine (NORVASC) 10 MG tablet Take 1 tablet (10 mg total) by mouth daily. 90 tablet 3   aspirin EC 81 MG tablet Take 81 mg by mouth daily.     azelastine (ASTELIN) 0.1 % nasal spray Place 1-2 sprays into both nostrils 2 (two) times daily. Use  in each nostril as directed 30 mL 6   carboxymethylcellulose (REFRESH TEARS) 0.5 % SOLN Place 1 drop into both eyes daily as needed (dry eyes).     cetirizine (ZYRTEC) 10 MG tablet Take 10 mg by mouth daily. Alternates weekly with allegra.     clopidogrel (PLAVIX) 75 MG tablet Take 1 tablet (75 mg total) by mouth daily. 90 tablet 1   donepezil (ARICEPT) 5 MG tablet Take 1 tablet (5 mg total) by mouth at bedtime. 30 tablet 6   esomeprazole (NEXIUM) 20 MG capsule TAKE 1 CAPSULE BY MOUTH DAILY AT 12 PM 90 capsule 0   fexofenadine (ALLEGRA) 180 MG tablet Take 180 mg by mouth daily. Alternates weekly with zyrtec.     GLUCOSAMINE-CHONDROITIN PO Take 2 tablets by mouth daily.     hydrALAZINE (APRESOLINE) 25 MG tablet Take 1 tablet (25 mg total) by mouth 3 (three) times daily. 90 tablet 3   Magnesium 250 MG TABS Take 1 tablet (250 mg total) by mouth daily.  0   Multiple Vitamins-Minerals (MULTIVITAMIN ADULT PO) Take 1 tablet by mouth daily.     polyethylene glycol (MIRALAX / GLYCOLAX) packet Take 8.5 g by mouth daily.      No facility-administered medications prior to visit.     Per HPI unless specifically indicated in ROS section below Review of Systems  Constitutional:  Negative for activity change, appetite change, chills, fatigue, fever and unexpected weight change.  HENT:  Negative for hearing loss.   Eyes:  Negative for visual disturbance.  Respiratory:  Negative for cough, chest tightness, shortness of breath and wheezing.   Cardiovascular:  Negative for chest pain, palpitations and leg swelling.  Gastrointestinal:  Negative for abdominal distention, abdominal pain, blood in stool, constipation, diarrhea, nausea and vomiting.  Genitourinary:  Negative for difficulty urinating and hematuria.  Musculoskeletal:  Negative for arthralgias, myalgias and neck pain.  Skin:  Negative for rash.  Neurological:  Negative for dizziness, seizures, syncope and headaches.  Hematological:  Negative for  adenopathy. Does not bruise/bleed easily.  Psychiatric/Behavioral:  Negative for dysphoric mood. The patient is not nervous/anxious.     Objective:  BP (!) 142/62   Pulse (!) 53   Temp 97.7 F (36.5 C) (Temporal)   Ht 5' 7.5" (1.715 m)   Wt 217 lb 4 oz (98.5 kg)   SpO2 95%   BMI 33.52 kg/m   Wt Readings from Last 3 Encounters:  03/06/22 217 lb 4 oz (98.5 kg)  02/22/22 223 lb (101.2 kg)  02/07/22 223 lb (101.2 kg)      Physical Exam Vitals and nursing note reviewed.  Constitutional:      General: He is not in acute distress.    Appearance: Normal appearance. He is well-developed. He is not ill-appearing.  HENT:     Head: Normocephalic and atraumatic.     Right Ear: Hearing, tympanic membrane, ear canal and external ear normal.     Left Ear: Hearing, tympanic membrane, ear canal and external ear normal.     Nose: Nose normal. No congestion.     Mouth/Throat:     Mouth: Mucous membranes are moist.     Pharynx: Oropharynx is clear. No oropharyngeal exudate or posterior oropharyngeal erythema.  Eyes:     General: No scleral icterus.    Extraocular Movements: Extraocular movements intact.     Conjunctiva/sclera: Conjunctivae normal.     Pupils: Pupils are equal, round, and reactive to light.  Neck:     Thyroid: No thyroid mass or thyromegaly.     Vascular: Carotid bruit (bilateral) present.  Cardiovascular:     Rate and Rhythm: Regular rhythm.     Pulses: Normal pulses.          Radial pulses are 2+ on the right side and 2+ on the left side.     Heart sounds: Normal heart sounds. No murmur heard. Pulmonary:     Effort: Pulmonary effort is normal. No respiratory distress.     Breath sounds: Normal breath sounds. No wheezing, rhonchi or rales.  Abdominal:     General: Bowel sounds are normal. There is no distension.     Palpations: Abdomen is soft. There is no mass.     Tenderness: There is no abdominal tenderness. There is no guarding or rebound.     Hernia: No hernia is  present.  Musculoskeletal:        General: Normal range of motion.     Cervical back: Normal range of motion and neck supple.     Right lower leg: No edema.     Left lower leg: No edema.  Lymphadenopathy:     Cervical: No cervical adenopathy.  Skin:    General: Skin is warm and dry.     Findings: No rash.  Neurological:     General: No focal deficit present.     Mental Status: He is alert and oriented to person, place, and time.  Psychiatric:        Mood and Affect: Mood normal.        Behavior: Behavior normal.        Thought Content: Thought content normal.        Judgment: Judgment normal.       Results for orders placed or performed in visit on 01/17/22  CBC with Differential/Platelet  Result Value Ref Range   WBC 5.8 4.0 - 10.5 K/uL   RBC 3.73 (L) 4.22 - 5.81 Mil/uL   Hemoglobin 12.3 (L) 13.0 - 17.0 g/dL   HCT 35.5 (L) 39.0 - 52.0 %   MCV 95.0 78.0 - 100.0 fl   MCHC 34.6 30.0 - 36.0 g/dL   RDW 14.4 11.5 - 15.5 %   Platelets 240.0 150.0 - 400.0 K/uL   Neutrophils Relative % 56.4 43.0 - 77.0 %   Lymphocytes Relative 30.6 12.0 - 46.0 %   Monocytes Relative 9.9 3.0 - 12.0 %   Eosinophils Relative 2.2 0.0 - 5.0 %   Basophils Relative 0.9 0.0 - 3.0 %   Neutro Abs 3.3 1.4 - 7.7 K/uL   Lymphs Abs 1.8 0.7 - 4.0 K/uL   Monocytes Absolute 0.6 0.1 - 1.0 K/uL   Eosinophils Absolute 0.1 0.0 - 0.7 K/uL   Basophils Absolute 0.1 0.0 - 0.1 K/uL  Basic  metabolic panel  Result Value Ref Range   Sodium 137 135 - 145 mEq/L   Potassium 4.3 3.5 - 5.1 mEq/L   Chloride 102 96 - 112 mEq/L   CO2 27 19 - 32 mEq/L   Glucose, Bld 82 70 - 99 mg/dL   BUN 21 6 - 23 mg/dL   Creatinine, Ser 1.16 0.40 - 1.50 mg/dL   GFR 56.41 (L) >60.00 mL/min   Calcium 9.3 8.4 - 10.5 mg/dL  IBC panel  Result Value Ref Range   Iron 117 42 - 165 ug/dL   Transferrin 242.0 212.0 - 360.0 mg/dL   Saturation Ratios 34.5 20.0 - 50.0 %   TIBC 338.8 250.0 - 450.0 mcg/dL  Ferritin  Result Value Ref Range    Ferritin 232.4 22.0 - 322.0 ng/mL   Lab Results  Component Value Date   HGBA1C 5.6 01/09/2022    Lab Results  Component Value Date   CHOL 136 01/09/2022   HDL 41 01/09/2022   LDLCALC 67 01/09/2022   LDLDIRECT 76.0 02/05/2019   TRIG 138 01/09/2022   CHOLHDL 3.3 01/09/2022    Assessment & Plan:   Problem List Items Addressed This Visit     Advanced care planning/counseling discussion (Chronic)    Advanced directive discussion - needs updating. Would want wife to be HCPOA. Packet previously provided.       Health maintenance examination - Primary (Chronic)    Preventative protocols reviewed and updated unless pt declined. Discussed healthy diet and lifestyle.       GERD (gastroesophageal reflux disease)    Continues nexium '20mg'$  daily.       Relevant Orders   CBC with Differential/Platelet   Gout    No recent gout flare on allopurinol '200mg'$  daily      Relevant Orders   Uric acid   HTN (hypertension)    Chronic, above goal on recheck today. Advised keep daily log for the next week, drop off home readings and if staying above goal, low threshold to restart losartan.  Pt agrees with plan. Overall asxs.       OSA on CPAP    Continues regular CPAP use - up to 8 hours per day.       Obesity, Class I, BMI 30.0-34.9 (see actual BMI)    Encouraged healthy diet and lifestyle choices to affect sustainable weight loss.       Carotid stenosis, asymptomatic, bilateral    Has seen [facility], rec medical management.       Dyslipidemia    Recent FLP reassuring - off statin. Was not recommended starting this.       Relevant Orders   Comprehensive metabolic panel   Amputation of one or more toes (HCC)   Anemia    Update CBC. Recent anemia panel reassuring      History of cerebellar stroke    Appreciate cards and neurology care.       Prediabetes    Latest A1c normal range.       Other Visit Diagnoses     Need for influenza vaccination       Relevant Orders   Flu  Vaccine QUAD High Dose(Fluad) (Completed)        No orders of the defined types were placed in this encounter.  Orders Placed This Encounter  Procedures   Flu Vaccine QUAD High Dose(Fluad)   Uric acid   CBC with Differential/Platelet   Comprehensive metabolic panel    Patient instructions: Flu shot today  Labs today  If interested, check with pharmacy about new 2 shot shingles series (shingrix).  Work on Optometrist living will.  Stop aspirin first of December, then continue just plavix.  Return in 6 months for follow up visit.   Follow up plan: Return in about 6 months (around 09/05/2022) for follow up visit.  Ria Bush, MD

## 2022-03-06 NOTE — Assessment & Plan Note (Signed)
Appreciate cards and neurology care.

## 2022-03-06 NOTE — Assessment & Plan Note (Signed)
Continues nexium '20mg'$  daily.

## 2022-03-06 NOTE — Assessment & Plan Note (Signed)
Preventative protocols reviewed and updated unless pt declined. Discussed healthy diet and lifestyle.  

## 2022-03-06 NOTE — Assessment & Plan Note (Addendum)
Recent FLP reassuring - off statin. Was not recommended starting this.

## 2022-03-06 NOTE — Assessment & Plan Note (Signed)
Update CBC. Recent anemia panel reassuring

## 2022-03-06 NOTE — Assessment & Plan Note (Signed)
Continues regular CPAP use - up to 8 hours per day.

## 2022-03-06 NOTE — Assessment & Plan Note (Signed)
Encouraged healthy diet and lifestyle choices to affect sustainable weight loss.  ?

## 2022-03-06 NOTE — Assessment & Plan Note (Signed)
Has seen [facility], rec medical management.

## 2022-03-06 NOTE — Assessment & Plan Note (Signed)
Advanced directive discussion - needs updating. Would want wife to be HCPOA. Packet previously provided.

## 2022-03-06 NOTE — Patient Instructions (Addendum)
Flu shot today  Labs today  If interested, check with pharmacy about new 2 shot shingles series (shingrix).  Work on [facility] will.  Stop aspirin first of December, then continue just plavix.  Return in 6 months for follow up visit.   Health Maintenance After Age 86 After age 51, you are at a higher risk for certain long-term diseases and infections as well as injuries from falls. Falls are a major cause of broken bones and head injuries in people who are older than age 86. Getting regular preventive care can help to keep you healthy and well. Preventive care includes getting regular testing and making lifestyle changes as recommended by your health care provider. Talk with your health care provider about: Which screenings and tests you should have. A screening is a test that checks for a disease when you have no symptoms. A diet and exercise plan that is right for you. What should I know about screenings and tests to prevent falls? Screening and testing are the best ways to find a health problem early. Early diagnosis and treatment give you the best chance of managing medical conditions that are common after age 54. Certain conditions and lifestyle choices may make you more likely to have a fall. Your health care provider may recommend: Regular vision checks. Poor vision and conditions such as cataracts can make you more likely to have a fall. If you wear glasses, make sure to get your prescription updated if your vision changes. Medicine review. Work with your health care provider to regularly review all of the medicines you are taking, including over-the-counter medicines. Ask your health care provider about any side effects that may make you more likely to have a fall. Tell your health care provider if any medicines that you take make you feel dizzy or sleepy. Strength and balance checks. Your health care provider may recommend certain tests to check your strength and balance while  standing, walking, or changing positions. Foot health exam. Foot pain and numbness, as well as not wearing proper footwear, can make you more likely to have a fall. Screenings, including: Osteoporosis screening. Osteoporosis is a condition that causes the bones to get weaker and break more easily. Blood pressure screening. Blood pressure changes and medicines to control blood pressure can make you feel dizzy. Depression screening. You may be more likely to have a fall if you have a fear of falling, feel depressed, or feel unable to do activities that you used to do. Alcohol use screening. Using too much alcohol can affect your balance and may make you more likely to have a fall. Follow these instructions at home: Lifestyle Do not drink alcohol if: Your health care provider tells you not to drink. If you drink alcohol: Limit how much you have to: 0-1 drink a day for women. 0-2 drinks a day for men. Know how much alcohol is in your drink. In the [place] one drink equals one 12 oz bottle of beer (355 mL), one 5 oz glass of wine (148 mL), or one 1 oz glass of hard liquor (44 mL). Do not use any products that contain nicotine or tobacco. These products include cigarettes, chewing tobacco, and vaping devices, such as e-cigarettes. If you need help quitting, ask your health care provider. Activity  Follow a regular exercise program to stay fit. This will help you maintain your balance. Ask your health care provider what types of exercise are appropriate for you. If you need a cane or walker,  use it as recommended by your health care provider. Wear supportive shoes that have nonskid soles. Safety  Remove any tripping hazards, such as rugs, cords, and clutter. Install safety equipment such as grab bars in bathrooms and safety rails on stairs. Keep rooms and walkways well-lit. General instructions Talk with your health care provider about your risks for falling. Tell your health care provider  if: You fall. Be sure to tell your health care provider about all falls, even ones that seem minor. You feel dizzy, tiredness (fatigue), or off-balance. Take over-the-counter and prescription medicines only as told by your health care provider. These include supplements. Eat a healthy diet and maintain a healthy weight. A healthy diet includes low-fat dairy products, low-fat (lean) meats, and fiber from whole grains, beans, and lots of fruits and vegetables. Stay current with your vaccines. Schedule regular health, dental, and eye exams. Summary Having a healthy lifestyle and getting preventive care can help to protect your health and wellness after age 17. Screening and testing are the best way to find a health problem early and help you avoid having a fall. Early diagnosis and treatment give you the best chance for managing medical conditions that are more common for people who are older than age 44. Falls are a major cause of broken bones and head injuries in people who are older than age 12. Take precautions to prevent a fall at home. Work with your health care provider to learn what changes you can make to improve your health and wellness and to prevent falls. This information is not intended to replace advice given to you by your health care provider. Make sure you discuss any questions you have with your health care provider. Document Revised: 09/13/2020 Document Reviewed: 09/13/2020 Elsevier Patient Education  [place].

## 2022-03-06 NOTE — Assessment & Plan Note (Signed)
Chronic, above goal on recheck today. Advised keep daily log for the next week, drop off home readings and if staying above goal, low threshold to restart losartan.  Pt agrees with plan. Overall asxs.

## 2022-03-06 NOTE — Assessment & Plan Note (Signed)
Latest A1c normal range.

## 2022-03-16 ENCOUNTER — Other Ambulatory Visit: Payer: Self-pay | Admitting: Family Medicine

## 2022-03-16 DIAGNOSIS — M1A00X0 Idiopathic chronic gout, unspecified site, without tophus (tophi): Secondary | ICD-10-CM

## 2022-03-16 NOTE — Telephone Encounter (Signed)
Losartan Last OV:  03/06/22, CPE Next OV:  09/05/22, 6 mo f/u

## 2022-03-16 NOTE — Telephone Encounter (Signed)
Denied - pt no longer on medication.

## 2022-03-29 ENCOUNTER — Other Ambulatory Visit: Payer: Self-pay

## 2022-03-29 ENCOUNTER — Emergency Department: Payer: Medicare Other

## 2022-03-29 ENCOUNTER — Emergency Department
Admission: EM | Admit: 2022-03-29 | Discharge: 2022-03-29 | Disposition: A | Discharge status: Home / Self Care | Payer: Medicare Other | Attending: Emergency Medicine | Admitting: Emergency Medicine

## 2022-03-29 DIAGNOSIS — I6782 Cerebral ischemia: Secondary | ICD-10-CM | POA: Insufficient documentation

## 2022-03-29 DIAGNOSIS — R42 Dizziness and giddiness: Secondary | ICD-10-CM | POA: Diagnosis not present

## 2022-03-29 DIAGNOSIS — Z8673 Personal history of transient ischemic attack (TIA), and cerebral infarction without residual deficits: Secondary | ICD-10-CM | POA: Diagnosis not present

## 2022-03-29 DIAGNOSIS — R791 Abnormal coagulation profile: Secondary | ICD-10-CM | POA: Insufficient documentation

## 2022-03-29 DIAGNOSIS — R0689 Other abnormalities of breathing: Secondary | ICD-10-CM | POA: Diagnosis not present

## 2022-03-29 DIAGNOSIS — R110 Nausea: Secondary | ICD-10-CM | POA: Insufficient documentation

## 2022-03-29 DIAGNOSIS — I619 Nontraumatic intracerebral hemorrhage, unspecified: Secondary | ICD-10-CM | POA: Diagnosis not present

## 2022-03-29 DIAGNOSIS — I10 Essential (primary) hypertension: Secondary | ICD-10-CM | POA: Diagnosis not present

## 2022-03-29 DIAGNOSIS — I959 Hypotension, unspecified: Secondary | ICD-10-CM | POA: Diagnosis not present

## 2022-03-29 DIAGNOSIS — G459 Transient cerebral ischemic attack, unspecified: Secondary | ICD-10-CM | POA: Diagnosis not present

## 2022-03-29 DIAGNOSIS — Z79899 Other long term (current) drug therapy: Secondary | ICD-10-CM | POA: Diagnosis not present

## 2022-03-29 DIAGNOSIS — S022XXA Fracture of nasal bones, initial encounter for closed fracture: Secondary | ICD-10-CM | POA: Diagnosis not present

## 2022-03-29 LAB — URINE DRUG SCREEN, QUALITATIVE (ARMC ONLY)
Amphetamines, Ur Screen: NOT DETECTED
Barbiturates, Ur Screen: NOT DETECTED
Benzodiazepine, Ur Scrn: NOT DETECTED
Cannabinoid 50 Ng, Ur ~~LOC~~: NOT DETECTED
Cocaine Metabolite,Ur ~~LOC~~: NOT DETECTED
MDMA (Ecstasy)Ur Screen: NOT DETECTED
Methadone Scn, Ur: NOT DETECTED
Opiate, Ur Screen: NOT DETECTED
Phencyclidine (PCP) Ur S: NOT DETECTED
Tricyclic, Ur Screen: NOT DETECTED

## 2022-03-29 LAB — CBC
HCT: 36.8 % — ABNORMAL LOW (ref 39.0–52.0)
Hemoglobin: 12.3 g/dL — ABNORMAL LOW (ref 13.0–17.0)
MCH: 31.8 pg (ref 26.0–34.0)
MCHC: 33.4 g/dL (ref 30.0–36.0)
MCV: 95.1 fL (ref 80.0–100.0)
Platelets: 229 10*3/uL (ref 150–400)
RBC: 3.87 MIL/uL — ABNORMAL LOW (ref 4.22–5.81)
RDW: 14.1 % (ref 11.5–15.5)
WBC: 4.3 10*3/uL (ref 4.0–10.5)
nRBC: 0 % (ref 0.0–0.2)

## 2022-03-29 LAB — DIFFERENTIAL
Abs Immature Granulocytes: 0.01 10*3/uL (ref 0.00–0.07)
Basophils Absolute: 0.1 10*3/uL (ref 0.0–0.1)
Basophils Relative: 1 %
Eosinophils Absolute: 0.1 10*3/uL (ref 0.0–0.5)
Eosinophils Relative: 3 %
Immature Granulocytes: 0 %
Lymphocytes Relative: 28 %
Lymphs Abs: 1.2 10*3/uL (ref 0.7–4.0)
Monocytes Absolute: 0.4 10*3/uL (ref 0.1–1.0)
Monocytes Relative: 9 %
Neutro Abs: 2.5 10*3/uL (ref 1.7–7.7)
Neutrophils Relative %: 59 %

## 2022-03-29 LAB — COMPREHENSIVE METABOLIC PANEL
ALT: 19 U/L (ref 0–44)
AST: 27 U/L (ref 15–41)
Albumin: 4.1 g/dL (ref 3.5–5.0)
Alkaline Phosphatase: 46 U/L (ref 38–126)
Anion gap: 5 (ref 5–15)
BUN: 29 mg/dL — ABNORMAL HIGH (ref 8–23)
CO2: 25 mmol/L (ref 22–32)
Calcium: 9 mg/dL (ref 8.9–10.3)
Chloride: 109 mmol/L (ref 98–111)
Creatinine, Ser: 1.04 mg/dL (ref 0.61–1.24)
GFR, Estimated: >60 mL/min (ref >60)
Glucose, Bld: 102 mg/dL — ABNORMAL HIGH (ref 70–99)
Potassium: 4 mmol/L (ref 3.5–5.1)
Sodium: 139 mmol/L (ref 135–145)
Total Bilirubin: 0.6 mg/dL (ref 0.3–1.2)
Total Protein: 7.2 g/dL (ref 6.5–8.1)

## 2022-03-29 LAB — URINALYSIS, ROUTINE W REFLEX MICROSCOPIC
Bilirubin Urine: NEGATIVE
Glucose, UA: NEGATIVE mg/dL
Hgb urine dipstick: NEGATIVE
Ketones, ur: NEGATIVE mg/dL
Leukocytes,Ua: NEGATIVE
Nitrite: NEGATIVE
Protein, ur: NEGATIVE mg/dL
Specific Gravity, Urine: 1.012 (ref 1.005–1.030)
pH: 6 (ref 5.0–8.0)

## 2022-03-29 LAB — CBG MONITORING, ED: Glucose-Capillary: 106 mg/dL — ABNORMAL HIGH (ref 70–99)

## 2022-03-29 LAB — PROTIME-INR
INR: 1.1 (ref 0.8–1.2)
Prothrombin Time: 13.9 seconds (ref 11.4–15.2)

## 2022-03-29 LAB — APTT: aPTT: 39 seconds — ABNORMAL HIGH (ref 24–36)

## 2022-03-29 LAB — ETHANOL: Alcohol, Ethyl (B): <10 mg/dL (ref <10)

## 2022-03-29 NOTE — Discharge Instructions (Addendum)
Please begin taking amlodipine 10 mg at night instead of during the day.

## 2022-03-29 NOTE — ED Notes (Signed)
Pt leaves via wheelchair, steady gait around room prior to discharge.

## 2022-03-29 NOTE — ED Notes (Signed)
Pt verbalizes understanding of all discharge instructions and changes to when he takes his medications. VSS upon d/c.  BP slightly elevated, Dr. Cheri Fowler aware and is OK with discharging patient.   All questions answered.   Wheelchair to lobby.

## 2022-03-29 NOTE — ED Triage Notes (Signed)
Pt woke up with morning at 0600 feeling off - reports difficulty with ambulation and dizziness.   LKW 2130 last night.   BG 127, VSS with EMS.   Pt alert and oriented x 4, no obvious neuro deficits. Hx of CVA

## 2022-03-29 NOTE — ED Provider Notes (Signed)
[facility] Provider Note   Event Date/Time   First MD Initiated Contact with Patient 03/29/22 [phone]     (approximate) History  Dizziness  HPI [name] is a 88 y.o. male with a stated past medical history of hypertension and recent cerebellar CVA in 9/23 who presents complaining of similar symptoms to his previous cerebellar stroke including significant dizziness with mild nausea.  Patient states that his last known well was last night before he went to sleep and when he woke this morning he felt himself to be unsteady on his feet and unable to stand up straight without grabbing onto something.  Patient has a sensation as if the world is spinning around him and states that this is similar to the symptoms he had during his previous stroke. ROS: Patient currently denies any vision changes, tinnitus, difficulty speaking, facial droop, sore throat, chest pain, shortness of breath, abdominal pain, vomiting/diarrhea, dysuria, or weakness/numbness/paresthesias in any extremity   Physical Exam  Triage Vital Signs: ED Triage Vitals  Enc Vitals Group     BP 03/29/22 0730 (!) 156/45     Pulse Rate 03/29/22 0730 (!) 56     Resp 03/29/22 0730 20     Temp --      Temp src --      SpO2 03/29/22 0730 96 %     Weight 03/29/22 0728 218 lb (98.9 kg)     Height 03/29/22 0728 '5\' 9"'$  (1.753 m)     Head Circumference --      Peak Flow --      Pain Score --      Pain Loc --      Pain Edu? --      Excl. in [place]? --    Most recent vital signs: Vitals:   03/29/22 1000 03/29/22 1201  BP: (!) 144/101 (!) 171/71  Pulse: (!) 57 (!) 59  Resp: 14 20  Temp:  (!) 97.4 F (36.3 C)  SpO2: 97% 98%   General: Awake, oriented x4. CV:  Good peripheral perfusion.  Resp:  Normal effort.  Abd:  No distention.  Other:  Elderly Caucasian overweight male laying in bed in no acute distress.  Hints exam benign.  NIH stroke scale 0 ED Results / Procedures / Treatments  Labs (all labs  ordered are listed, but only abnormal results are displayed) Labs Reviewed  APTT - Abnormal; Notable for the following components:      Result Value   aPTT 39 (*)    All other components within normal limits  CBC - Abnormal; Notable for the following components:   RBC 3.87 (*)    Hemoglobin 12.3 (*)    HCT 36.8 (*)    All other components within normal limits  COMPREHENSIVE METABOLIC PANEL - Abnormal; Notable for the following components:   Glucose, Bld 102 (*)    BUN 29 (*)    All other components within normal limits  URINALYSIS, ROUTINE W REFLEX MICROSCOPIC - Abnormal; Notable for the following components:   Color, Urine STRAW (*)    APPearance CLEAR (*)    All other components within normal limits  CBG MONITORING, ED - Abnormal; Notable for the following components:   Glucose-Capillary 106 (*)    All other components within normal limits  ETHANOL  PROTIME-INR  DIFFERENTIAL  URINE DRUG SCREEN, QUALITATIVE ([facility] ONLY)   EKG ED ECG REPORT I, Naaman Plummer, the attending physician, personally viewed and interpreted this ECG. Date:  03/29/2022 EKG Time: 0729 Rate: 54 Rhythm: normal sinus rhythm QRS Axis: normal Intervals: normal ST/T Wave abnormalities: normal Narrative Interpretation: no evidence of acute ischemia RADIOLOGY ED MD interpretation: CT of the head without contrast interpreted by me shows no evidence of acute abnormalities including no intracerebral hemorrhage, obvious masses, or significant edema  MRI brain shows no evidence of acute abnormalities -Agree with radiology assessment Official radiology report(s): MR Brain Wo Contrast (neuro protocol)  Result Date: 03/29/2022 CLINICAL DATA:  Stroke suspected. EXAM: MRI HEAD WITHOUT CONTRAST TECHNIQUE: Multiplanar, multiecho pulse sequences of the brain and surrounding structures were obtained without intravenous contrast. COMPARISON:  Same-day CT brain, MRI brain 01/08/2022 FINDINGS: Brain: No acute infarction,  hydrocephalus, extra-axial collection or mass lesion. There is a small punctate focus of microhemorrhage in the corona radiata on the right (series 13, image 40), unchanged. Sequela of mild chronic microvascular ischemic change. Chronic bilateral cerebellar infarcts. Vascular: Normal flow voids. Skull and upper cervical spine: Normal marrow signal. Sinuses/Orbits: Bilateral lens replacement. No significant paranasal sinus disease. Other: None. IMPRESSION: No acute intracranial abnormality. Electronically Signed   By: Marin Roberts M.D.   On: 03/29/2022 11:14   CT HEAD WO CONTRAST (5MM)  Result Date: 03/29/2022 CLINICAL DATA:  Woke up feeling off. Difficulty with ambulation and dizziness. EXAM: CT HEAD WITHOUT CONTRAST TECHNIQUE: Contiguous axial images were obtained from the base of the skull through the vertex without intravenous contrast. RADIATION DOSE REDUCTION: This exam was performed according to the departmental dose-optimization program which includes automated exposure control, adjustment of the mA and/or kV according to patient size and/or use of iterative reconstruction technique. COMPARISON:  MRI Brain 01/08/22, CTA head/neck 01/09/22 FINDINGS: Brain: No evidence of acute infarction, hemorrhage, hydrocephalus, extra-axial collection or mass lesion/mass effect. There is advanced generalized volume loss sequela of mild-to-moderate chronic microvascular ischemic change. Ventriculomegaly, favored to be secondary to cerebral volume loss. Vascular: No hyperdense vessel or unexpected calcification. Severe atherosclerotic calcification of the V4 segment of the left vertebral artery. Skull: Chronic appearing nasal bone fracture. Sinuses/Orbits: Bilateral lens replacement. Paranasal sinuses are clear. No mastoid effusion. Other: None. IMPRESSION: 1. No hemorrhage or CT evidence of a new infarct. 2. Advanced generalized volume loss with sequela of mild-to-moderate chronic microvascular ischemic change.  Electronically Signed   By: Marin Roberts M.D.   On: 03/29/2022 08:08   PROCEDURES: Critical Care performed: No .1-3 Lead EKG Interpretation  Performed by: Naaman Plummer, MD Authorized by: Naaman Plummer, MD     Interpretation: normal     ECG rate:  60   ECG rate assessment: normal     Rhythm: sinus rhythm     Ectopy: none     Conduction: normal    MEDICATIONS ORDERED IN ED: Medications - No data to display IMPRESSION / MDM / [place] / ED COURSE  I reviewed the triage vital signs and the nursing notes.                             The patient is on the cardiac monitor to evaluate for evidence of arrhythmia and/or significant heart rate changes. Patient's presentation is most consistent with acute presentation with potential threat to life or bodily function. Based on History, Exam, and Findings, presentation not consistent with syncope, seizure, stroke, meningitis, symptomatic anemia (gastrointestinal bleed), Increased ICP (cerebral tumor/mass), ICH. Additionally, I have a low suspicion for AOM, labyrinthitis, or other infectious process. Head CT/MRI shows no  signs of new or repeat stroke Reassessment: Prior to discharge symptoms controlled, patient well appearing. Disposition:  Discharge. Strict return precautions discussed w/ full understanding. Advise follow up with primary care provider within 24-48 hours.   FINAL CLINICAL IMPRESSION(S) / ED DIAGNOSES   Final diagnoses:  Dizziness  Lightheadedness   Rx / DC Orders   ED Discharge Orders     None      Note:  This document was prepared using Dragon voice recognition software and may include unintentional dictation errors.   Naaman Plummer, MD 03/29/22 1249

## 2022-03-29 NOTE — ED Notes (Signed)
Patient at MRI 

## 2022-03-31 ENCOUNTER — Other Ambulatory Visit: Payer: Self-pay | Admitting: Family Medicine

## 2022-04-05 DIAGNOSIS — I10 Essential (primary) hypertension: Secondary | ICD-10-CM | POA: Diagnosis not present

## 2022-04-05 DIAGNOSIS — G4733 Obstructive sleep apnea (adult) (pediatric): Secondary | ICD-10-CM | POA: Diagnosis not present

## 2022-04-10 ENCOUNTER — Ambulatory Visit: Payer: Medicare Other | Admitting: Dermatology

## 2022-04-10 ENCOUNTER — Encounter: Payer: Self-pay | Admitting: Dermatology

## 2022-04-10 DIAGNOSIS — Z1283 Encounter for screening for malignant neoplasm of skin: Secondary | ICD-10-CM | POA: Diagnosis not present

## 2022-04-10 DIAGNOSIS — L820 Inflamed seborrheic keratosis: Secondary | ICD-10-CM | POA: Diagnosis not present

## 2022-04-10 DIAGNOSIS — L578 Other skin changes due to chronic exposure to nonionizing radiation: Secondary | ICD-10-CM

## 2022-04-10 DIAGNOSIS — Z85828 Personal history of other malignant neoplasm of skin: Secondary | ICD-10-CM | POA: Diagnosis not present

## 2022-04-10 DIAGNOSIS — Z8589 Personal history of malignant neoplasm of other organs and systems: Secondary | ICD-10-CM

## 2022-04-10 DIAGNOSIS — D692 Other nonthrombocytopenic purpura: Secondary | ICD-10-CM | POA: Diagnosis not present

## 2022-04-10 DIAGNOSIS — L570 Actinic keratosis: Secondary | ICD-10-CM

## 2022-04-10 DIAGNOSIS — L821 Other seborrheic keratosis: Secondary | ICD-10-CM

## 2022-04-10 DIAGNOSIS — D229 Melanocytic nevi, unspecified: Secondary | ICD-10-CM

## 2022-04-10 DIAGNOSIS — L814 Other melanin hyperpigmentation: Secondary | ICD-10-CM

## 2022-04-10 NOTE — Progress Notes (Signed)
Follow-Up Visit   Subjective  [name] is a 88 y.o. male who presents for the following: Annual Exam (Concerned with spot on right side of face. HxSCC, HxAKs). The patient presents for Upper Body Skin Exam (UBSE) for skin cancer screening and mole check.  The patient has spots, moles and lesions to be evaluated, some may be new or changing and the patient has concerns that these could be cancer.  The following portions of the chart were reviewed this encounter and updated as appropriate:  Tobacco  Allergies  Meds  Problems  Med Hx  Surg Hx  Fam Hx     Review of Systems: No other skin or systemic complaints except as noted in HPI or Assessment and Plan.  Objective  Well appearing patient in no apparent distress; mood and affect are within normal limits.  All skin waist up examined.  face, ears. neck x12 (12) Erythematous thin papules/macules with gritty scale.   Right Cheek x1 Erythematous keratotic or waxy stuck-on papule or plaque.   Assessment & Plan   History of Squamous Cell Carcinoma of the Skin - No evidence of recurrence today - No lymphadenopathy - Recommend regular full body skin exams - Recommend daily broad spectrum sunscreen SPF 30+ to sun-exposed areas, reapply every 2 hours as needed.  - Call if any new or changing lesions are noted between office visits  Lentigines - Scattered tan macules - Due to sun exposure - Benign-appearing, observe - Recommend daily broad spectrum sunscreen SPF 30+ to sun-exposed areas, reapply every 2 hours as needed. - Call for any changes  Seborrheic Keratoses - Stuck-on, waxy, tan-brown papules and/or plaques  - Benign-appearing - Discussed benign etiology and prognosis. - Observe - Call for any changes  Melanocytic Nevi - Tan-brown and/or pink-flesh-colored symmetric macules and papules - Benign appearing on exam today - Observation - Call clinic for new or changing moles - Recommend daily use of broad  spectrum spf 30+ sunscreen to sun-exposed areas.   Hemangiomas - Red papules - Discussed benign nature - Observe - Call for any changes  Actinic Damage - Chronic condition, secondary to cumulative UV/sun exposure - diffuse scaly erythematous macules with underlying dyspigmentation - Recommend daily broad spectrum sunscreen SPF 30+ to sun-exposed areas, reapply every 2 hours as needed.  - Staying in the shade or wearing long sleeves, sun glasses (UVA+UVB protection) and wide brim hats (4-inch brim around the entire circumference of the hat) are also recommended for sun protection.  - Call for new or changing lesions.  Skin cancer screening performed today.  Purpura - Chronic; persistent and recurrent.  Treatable, but not curable. Arms. - Violaceous macules and patches - Benign - Related to trauma, age, sun damage and/or use of blood thinners, chronic use of topical and/or oral steroids - Observe - Can use OTC arnica containing moisturizer such as Dermend Bruise Formula if desired - Call for worsening or other concerns  AK (actinic keratosis) (12) face, ears. neck x12  Actinic keratoses are precancerous spots that appear secondary to cumulative UV radiation exposure/sun exposure over time. They are chronic with expected duration over 1 year. A portion of actinic keratoses will progress to squamous cell carcinoma of the skin. It is not possible to reliably predict which spots will progress to skin cancer and so treatment is recommended to prevent development of skin cancer.  Recommend daily broad spectrum sunscreen SPF 30+ to sun-exposed areas, reapply every 2 hours as needed.  Recommend staying in the  shade or wearing long sleeves, sun glasses (UVA+UVB protection) and wide brim hats (4-inch brim around the entire circumference of the hat). Call for new or changing lesions.  Destruction of lesion - face, ears. neck x12 Complexity: simple   Destruction method: cryotherapy   Informed  consent: discussed and consent obtained   Timeout:  patient name, date of birth, surgical site, and procedure verified Lesion destroyed using liquid nitrogen: Yes   Region frozen until ice ball extended beyond lesion: Yes   Outcome: patient tolerated procedure well with no complications   Post-procedure details: wound care instructions given   Additional details:  Prior to procedure, discussed risks of blister formation, small wound, skin dyspigmentation, or rare scar following cryotherapy. Recommend Vaseline ointment to treated areas while healing.   Inflamed seborrheic keratosis Right Cheek x1  Symptomatic, irritating, patient would like treated.  Destruction of lesion - Right Cheek x1 Complexity: simple   Destruction method: cryotherapy   Informed consent: discussed and consent obtained   Timeout:  patient name, date of birth, surgical site, and procedure verified Lesion destroyed using liquid nitrogen: Yes   Region frozen until ice ball extended beyond lesion: Yes   Outcome: patient tolerated procedure well with no complications   Post-procedure details: wound care instructions given   Additional details:  Prior to procedure, discussed risks of blister formation, small wound, skin dyspigmentation, or rare scar following cryotherapy. Recommend Vaseline ointment to treated areas while healing.    Return in about 6 months (around 10/10/2022) for TBSE, HxSCC, HxAK.  I, Emelia Salisbury, CMA, am acting as scribe for Sarina Ser, MD. Documentation: I have reviewed the above documentation for accuracy and completeness, and I agree with the above.  Sarina Ser, MD

## 2022-04-10 NOTE — Patient Instructions (Addendum)
Cryotherapy Aftercare  Wash gently with soap and water everyday.   Apply Vaseline and Band-Aid daily until healed.     Recommend daily broad spectrum sunscreen SPF 30+ to sun-exposed areas, reapply every 2 hours as needed. Call for new or changing lesions.  Staying in the shade or wearing long sleeves, sun glasses (UVA+UVB protection) and wide brim hats (4-inch brim around the entire circumference of the hat) are also recommended for sun protection.     Melanoma ABCDEs  Melanoma is the most dangerous type of skin cancer, and is the leading cause of death from skin disease.  You are more likely to develop melanoma if you: Have light-colored skin, light-colored eyes, or red or blond hair Spend a lot of time in the sun Tan regularly, either outdoors or in a tanning bed Have had blistering sunburns, especially during childhood Have a close family member who has had a melanoma Have atypical moles or large birthmarks  Early detection of melanoma is key since treatment is typically straightforward and cure rates are extremely high if we catch it early.   The first sign of melanoma is often a change in a mole or a new dark spot.  The ABCDE system is a way of remembering the signs of melanoma.  A for asymmetry:  The two halves do not match. B for border:  The edges of the growth are irregular. C for color:  A mixture of colors are present instead of an even brown color. D for diameter:  Melanomas are usually (but not always) greater than 6mm - the size of a pencil eraser. E for evolution:  The spot keeps changing in size, shape, and color.  Please check your skin once per month between visits. You can use a small mirror in front and a large mirror behind you to keep an eye on the back side or your body.   If you see any new or changing lesions before your next follow-up, please call to schedule a visit.  Please continue daily skin protection including broad spectrum sunscreen SPF 30+ to  sun-exposed areas, reapplying every 2 hours as needed when you're outdoors.   Staying in the shade or wearing long sleeves, sun glasses (UVA+UVB protection) and wide brim hats (4-inch brim around the entire circumference of the hat) are also recommended for sun protection.     Due to recent changes in healthcare laws, you may see results of your pathology and/or laboratory studies on MyChart before the doctors have had a chance to review them. We understand that in some cases there may be results that are confusing or concerning to you. Please understand that not all results are received at the same time and often the doctors may need to interpret multiple results in order to provide you with the best plan of care or course of treatment. Therefore, we ask that you please give us 2 business days to thoroughly review all your results before contacting the office for clarification. Should we see a critical lab result, you will be contacted sooner.   If You Need Anything After Your Visit  If you have any questions or concerns for your doctor, please call our main line at [phone] and press option 4 to reach your doctor's medical assistant. If no one answers, please leave a voicemail as directed and we will return your call as soon as possible. Messages left after 4 pm will be answered the following business day.   You may also send us   a message via MyChart. We typically respond to MyChart messages within 1-2 business days.  For prescription refills, please ask your pharmacy to contact our office. Our fax number is [phone].  If you have an urgent issue when the clinic is closed that cannot wait until the next business day, you can page your doctor at the number below.    Please note that while we do our best to be available for urgent issues outside of office hours, we are not available 24/7.   If you have an urgent issue and are unable to reach us, you may choose to seek medical care at your  doctor's office, retail clinic, urgent care center, or emergency room.  If you have a medical emergency, please immediately call 911 or go to the emergency department.  Pager Numbers  - Dr. Kowalski: [phone]  - Dr. Moye: [phone]  - Dr. Stewart: [phone]  In the event of inclement weather, please call our main line at [phone] for an update on the status of any delays or closures.  Dermatology Medication Tips: Please keep the boxes that topical medications come in in order to help keep track of the instructions about where and how to use these. Pharmacies typically print the medication instructions only on the boxes and not directly on the medication tubes.   If your medication is too expensive, please contact our office at [phone] option 4 or send us a message through MyChart.   We are unable to tell what your co-pay for medications will be in advance as this is different depending on your insurance coverage. However, we may be able to find a substitute medication at lower cost or fill out paperwork to get insurance to cover a needed medication.   If a prior authorization is required to get your medication covered by your insurance company, please allow us 1-2 business days to complete this process.  Drug prices often vary depending on where the prescription is filled and some pharmacies may offer cheaper prices.  The website www.goodrx.com contains coupons for medications through different pharmacies. The prices here do not account for what the cost may be with help from insurance (it may be cheaper with your insurance), but the website can give you the price if you did not use any insurance.  - You can print the associated coupon and take it with your prescription to the pharmacy.  - You may also stop by our office during regular business hours and pick up a GoodRx coupon card.  - If you need your prescription sent electronically to a different pharmacy, notify  our office through [place] MyChart or by phone at [phone] option 4.     Si Usted Necesita Algo Despus de Su Visita  Tambin puede enviarnos un mensaje a travs de MyChart. Por lo general respondemos a los mensajes de MyChart en el transcurso de 1 a 2 das hbiles.  Para renovar recetas, por favor pida a su farmacia que se ponga en contacto con nuestra oficina. Nuestro nmero de fax es el [phone].  Si tiene un asunto urgente cuando la clnica est cerrada y que no puede esperar hasta el siguiente da hbil, puede llamar/localizar a su doctor(a) al nmero que aparece a continuacin.   Por favor, tenga en cuenta que aunque hacemos todo lo posible para estar disponibles para asuntos urgentes fuera del horario de oficina, no estamos disponibles las 24 horas del da, los 7 das de la semana.   Si tiene un problema   urgente y no puede comunicarse con nosotros, puede optar por buscar atencin mdica  en el consultorio de su doctor(a), en una clnica privada, en un centro de atencin urgente o en una sala de emergencias.  Si tiene una emergencia mdica, por favor llame inmediatamente al 911 o vaya a la sala de emergencias.  Nmeros de bper  - Dr. Kowalski: [phone]  - Dra. Moye: [phone]  - Dra. Stewart: [phone]  En caso de inclemencias del tiempo, por favor llame a nuestra lnea principal al [phone] para una actualizacin sobre el estado de cualquier retraso o cierre.  Consejos para la medicacin en dermatologa: Por favor, guarde las cajas en las que vienen los medicamentos de uso tpico para ayudarle a seguir las instrucciones sobre dnde y cmo usarlos. Las farmacias generalmente imprimen las instrucciones del medicamento slo en las cajas y no directamente en los tubos del medicamento.   Si su medicamento es muy caro, por favor, pngase en contacto con nuestra oficina llamando al [phone] y presione la opcin 4 o envenos un mensaje a travs de  MyChart.   No podemos decirle cul ser su copago por los medicamentos por adelantado ya que esto es diferente dependiendo de la cobertura de su seguro. Sin embargo, es posible que podamos encontrar un medicamento sustituto a menor costo o llenar un formulario para que el seguro cubra el medicamento que se considera necesario.   Si se requiere una autorizacin previa para que su compaa de seguros cubra su medicamento, por favor permtanos de 1 a 2 das hbiles para completar este proceso.  Los precios de los medicamentos varan con frecuencia dependiendo del lugar de dnde se surte la receta y alguna farmacias pueden ofrecer precios ms baratos.  El sitio web www.goodrx.com tiene cupones para medicamentos de diferentes farmacias. Los precios aqu no tienen en cuenta lo que podra costar con la ayuda del seguro (puede ser ms barato con su seguro), pero el sitio web puede darle el precio si no utiliz ningn seguro.  - Puede imprimir el cupn correspondiente y llevarlo con su receta a la farmacia.  - Tambin puede pasar por nuestra oficina durante el horario de atencin regular y recoger una tarjeta de cupones de GoodRx.  - Si necesita que su receta se enve electrnicamente a una farmacia diferente, informe a nuestra oficina a travs de MyChart de [place] o por telfono llamando al [phone] y presione la opcin 4.  

## 2022-04-21 ENCOUNTER — Other Ambulatory Visit: Payer: Self-pay

## 2022-04-21 DIAGNOSIS — I10 Essential (primary) hypertension: Secondary | ICD-10-CM

## 2022-04-21 MED ORDER — HYDRALAZINE HCL 25 MG PO TABS: 25.0000 mg | ORAL_TABLET | Freq: Three times a day (TID) | ORAL | 1 refills | Status: DC

## 2022-04-21 NOTE — Telephone Encounter (Signed)
E-scribed refill 

## 2022-04-23 ENCOUNTER — Encounter: Payer: Self-pay | Admitting: Dermatology

## 2022-04-27 DIAGNOSIS — M955 Acquired deformity of pelvis: Secondary | ICD-10-CM | POA: Diagnosis not present

## 2022-04-27 DIAGNOSIS — M5416 Radiculopathy, lumbar region: Secondary | ICD-10-CM | POA: Diagnosis not present

## 2022-04-27 DIAGNOSIS — M9903 Segmental and somatic dysfunction of lumbar region: Secondary | ICD-10-CM | POA: Diagnosis not present

## 2022-04-27 DIAGNOSIS — M9905 Segmental and somatic dysfunction of pelvic region: Secondary | ICD-10-CM | POA: Diagnosis not present

## 2022-05-05 DIAGNOSIS — G4733 Obstructive sleep apnea (adult) (pediatric): Secondary | ICD-10-CM | POA: Diagnosis not present

## 2022-06-08 ENCOUNTER — Other Ambulatory Visit: Payer: Self-pay | Admitting: Family Medicine

## 2022-06-08 DIAGNOSIS — K219 Gastro-esophageal reflux disease without esophagitis: Secondary | ICD-10-CM

## 2022-07-07 ENCOUNTER — Other Ambulatory Visit: Payer: Self-pay | Admitting: Family Medicine

## 2022-07-07 NOTE — Telephone Encounter (Signed)
Plavix  Last filled:  04/21/22, #90 Last OV: 03/06/22 Next OV:  09/05/22, 6 mo f/u

## 2022-07-11 NOTE — Telephone Encounter (Signed)
Patient called in to follow up on this medication refill. He stated that he is running low. Please advise. Thank you!

## 2022-07-12 MED ORDER — PANTOPRAZOLE SODIUM 20 MG PO TBEC: 20.0000 mg | DELAYED_RELEASE_TABLET | Freq: Every day | ORAL | 1 refills | Status: DC

## 2022-07-12 NOTE — Telephone Encounter (Signed)
ERx plavix - however this does interact with nexium '20mg'$  which is on his med list - nexium decreases effect of plavix.  Recommend we change nexium to pantoprazole '20mg'$  daily which was sent to his pharmacy.

## 2022-07-13 ENCOUNTER — Other Ambulatory Visit: Payer: Self-pay | Admitting: Family Medicine

## 2022-07-13 NOTE — Telephone Encounter (Signed)
Spoke with pt's wife, Vaughan Basta (on dpr), relaying Dr. Synthia Innocent message. Verbalizes understanding and will inform pt.

## 2022-07-13 NOTE — Telephone Encounter (Signed)
Lvm asking pt to call back.  Need to relay Dr. G's message.  

## 2022-07-14 NOTE — Telephone Encounter (Signed)
Donepezil Last filled:  07/07/22, #30 Last OV:  03/07/23, CPE Next OV:  09/05/22, 6 mo f/u

## 2022-08-22 DIAGNOSIS — K080 Exfoliation of teeth due to systemic causes: Secondary | ICD-10-CM | POA: Diagnosis not present

## 2022-09-05 ENCOUNTER — Ambulatory Visit (INDEPENDENT_AMBULATORY_CARE_PROVIDER_SITE_OTHER): Payer: Medicare Other | Admitting: Family Medicine

## 2022-09-05 ENCOUNTER — Encounter: Payer: Self-pay | Admitting: Family Medicine

## 2022-09-05 VITALS — BP 160/70 | HR 60 | Temp 97.3°F | Ht 69.0 in | Wt 217.1 lb

## 2022-09-05 DIAGNOSIS — G4762 Sleep related leg cramps: Secondary | ICD-10-CM | POA: Diagnosis not present

## 2022-09-05 DIAGNOSIS — E785 Hyperlipidemia, unspecified: Secondary | ICD-10-CM

## 2022-09-05 DIAGNOSIS — R011 Cardiac murmur, unspecified: Secondary | ICD-10-CM

## 2022-09-05 DIAGNOSIS — I6523 Occlusion and stenosis of bilateral carotid arteries: Secondary | ICD-10-CM

## 2022-09-05 DIAGNOSIS — I10 Essential (primary) hypertension: Secondary | ICD-10-CM | POA: Diagnosis not present

## 2022-09-05 DIAGNOSIS — Z8673 Personal history of transient ischemic attack (TIA), and cerebral infarction without residual deficits: Secondary | ICD-10-CM

## 2022-09-05 DIAGNOSIS — G3184 Mild cognitive impairment, so stated: Secondary | ICD-10-CM

## 2022-09-05 LAB — CBC WITH DIFFERENTIAL/PLATELET
Basophils Absolute: 0 10*3/uL (ref 0.0–0.1)
Basophils Relative: 0.7 % (ref 0.0–3.0)
Eosinophils Absolute: 0.1 10*3/uL (ref 0.0–0.7)
Eosinophils Relative: 2.4 % (ref 0.0–5.0)
HCT: 35.3 % — ABNORMAL LOW (ref 39.0–52.0)
Hemoglobin: 12.2 g/dL — ABNORMAL LOW (ref 13.0–17.0)
Lymphocytes Relative: 28.4 % (ref 12.0–46.0)
Lymphs Abs: 1.3 10*3/uL (ref 0.7–4.0)
MCHC: 34.7 g/dL (ref 30.0–36.0)
MCV: 96.1 fl (ref 78.0–100.0)
Monocytes Absolute: 0.5 10*3/uL (ref 0.1–1.0)
Monocytes Relative: 10.7 % (ref 3.0–12.0)
Neutro Abs: 2.6 10*3/uL (ref 1.4–7.7)
Neutrophils Relative %: 57.8 % (ref 43.0–77.0)
Platelets: 232 10*3/uL (ref 150.0–400.0)
RBC: 3.68 Mil/uL — ABNORMAL LOW (ref 4.22–5.81)
RDW: 14.7 % (ref 11.5–15.5)
WBC: 4.4 10*3/uL (ref 4.0–10.5)

## 2022-09-05 LAB — BASIC METABOLIC PANEL
BUN: 16 mg/dL (ref 6–23)
CO2: 28 mEq/L (ref 19–32)
Calcium: 8.8 mg/dL (ref 8.4–10.5)
Chloride: 104 mEq/L (ref 96–112)
Creatinine, Ser: 1 mg/dL (ref 0.40–1.50)
GFR: 67.11 mL/min (ref >60.00)
Glucose, Bld: 106 mg/dL — ABNORMAL HIGH (ref 70–99)
Potassium: 4.2 mEq/L (ref 3.5–5.1)
Sodium: 139 mEq/L (ref 135–145)

## 2022-09-05 LAB — CK: Total CK: 296 U/L — ABNORMAL HIGH (ref 7–232)

## 2022-09-05 LAB — TSH: TSH: 1.89 u[IU]/mL (ref 0.35–5.50)

## 2022-09-05 LAB — MAGNESIUM: Magnesium: 2 mg/dL (ref 1.5–2.5)

## 2022-09-05 MED ORDER — HYDRALAZINE HCL 50 MG PO TABS: 50.0000 mg | ORAL_TABLET | Freq: Two times a day (BID) | ORAL | 1 refills | Status: DC

## 2022-09-05 MED ORDER — ATORVASTATIN CALCIUM 10 MG PO TABS: 10.0000 mg | ORAL_TABLET | Freq: Every day | ORAL | 6 refills | Status: DC

## 2022-09-05 NOTE — Assessment & Plan Note (Addendum)
Continue plavix daily. Trial low dose atorvastatin 10mg .

## 2022-09-05 NOTE — Progress Notes (Signed)
Ph: [phone]       Fax: [phone]   Patient ID: [name], male    DOB: Jul 24, 1931, 89 y.o.   MRN: [id number]  This visit was conducted in person.  BP (!) 160/70 (BP Location: Right Arm, Cuff Size: Normal)   Pulse 60   Temp (!) 97.3 F (36.3 C) (Temporal)   Ht 5\' 9"  (1.753 m)   Wt 217 lb 2 oz (98.5 kg)   SpO2 95%   BMI 32.06 kg/m    CC: 6 mo f/u visit  Subjective:   HPI: [name] is a 89 y.o. male presenting on 09/05/2022 for Medical Management of Chronic Issues (Here for 6 mo f/u. C/o memory issues, cramps in legs- worse lying in bed and tingling in feet when not walking. )   Dealing with water leak from burst pipe at home over the past 1.5 months.   S/p stroke 01/2022 presenting with imbalance/dizziness. Brain imaging at that time showed ischemic nonhemorrhagic infarct of central cerebellum, treated with DAPT x 3 mo then plavix alone. Found to have RICA 70%, LICA 50% stenosis, as well as R subclavian stenosis 85% at origin and occluded L vertebral artery - thought noncontributory. Saw cardiology and neurology in follow up - f/u planned PRN.   He is not on statin - assume not started after recent stroke due to age.  Cholesterol levels have been stable off statin however   Rpt dizzy episode s/p ER evaluation 03/2022 - head CT and brain MRI overall reassuring, did show mild-mod chronic microvascular ischemic changes. No further dizziness.  Notes worsening nocturnal leg cramps and nocturnal tingling in legs, noted to posterior legs. No numbness of legs or feet, no tingling of toes. Not consistent with RLS. No claudication symptoms. No back pain. No recent falls or injury. Drinks quinine water with benefit.   Worsening memory - this is despite aricept 5mg  nightly.  He continues taking   HTN - Compliant with current antihypertensive regimen of amlodipine 10mg  daily, hydralazine 25mg  tid. Avoids BB in h/o bradycardia. Does not check blood pressures at home. No  low blood pressure readings or symptoms of dizziness/syncope.  Denies HA, vision changes, CP/tightness, SOB, leg swelling.       Relevant past medical, surgical, family and social history reviewed and updated as indicated. Interim medical history since our last visit reviewed. Allergies and medications reviewed and updated. Outpatient Medications Prior to Visit  Medication Sig Dispense Refill   acetaminophen (TYLENOL) 500 MG tablet Take 1-2 tablets (500-1,000 mg total) by mouth 2 (two) times daily as needed for moderate pain. (Patient taking differently: Take 500-1,000 mg by mouth 2 (two) times daily as needed for moderate pain. Pt states taking 3 tablets)     allopurinol (ZYLOPRIM) 100 MG tablet TAKE 2 TABLETS(200 MG) BY MOUTH DAILY 180 tablet 3   amLODipine (NORVASC) 10 MG tablet Take 1 tablet (10 mg total) by mouth daily. 90 tablet 3   aspirin EC 81 MG tablet Take 81 mg by mouth daily.     azelastine (ASTELIN) 0.1 % nasal spray Place 1-2 sprays into both nostrils 2 (two) times daily. Use in each nostril as directed 30 mL 6   carboxymethylcellulose (REFRESH TEARS) 0.5 % SOLN Place 1 drop into both eyes daily as needed (dry eyes).     cetirizine (ZYRTEC) 10 MG tablet Take 10 mg by mouth daily. Alternates weekly with allegra.     clopidogrel (PLAVIX) 75 MG tablet TAKE 1 TABLET(75  MG) BY MOUTH DAILY 90 tablet 1   donepezil (ARICEPT) 5 MG tablet TAKE 1 TABLET(5 MG) BY MOUTH AT BEDTIME 30 tablet 8   fexofenadine (ALLEGRA) 180 MG tablet Take 180 mg by mouth daily. Alternates weekly with zyrtec.     GLUCOSAMINE-CHONDROITIN PO Take 2 tablets by mouth daily.     Magnesium 250 MG TABS Take 1 tablet (250 mg total) by mouth daily.  0   Multiple Vitamins-Minerals (MULTIVITAMIN ADULT PO) Take 1 tablet by mouth daily.     pantoprazole (PROTONIX) 20 MG tablet Take 1 tablet (20 mg total) by mouth daily. 90 tablet 1   polyethylene glycol (MIRALAX / GLYCOLAX) packet Take 8.5 g by mouth daily.      hydrALAZINE  (APRESOLINE) 25 MG tablet Take 1 tablet (25 mg total) by mouth 3 (three) times daily. 270 tablet 1   No facility-administered medications prior to visit.     Per HPI unless specifically indicated in ROS section below Review of Systems  Objective:  BP (!) 160/70 (BP Location: Right Arm, Cuff Size: Normal)   Pulse 60   Temp (!) 97.3 F (36.3 C) (Temporal)   Ht 5\' 9"  (1.753 m)   Wt 217 lb 2 oz (98.5 kg)   SpO2 95%   BMI 32.06 kg/m   Wt Readings from Last 3 Encounters:  09/05/22 217 lb 2 oz (98.5 kg)  03/29/22 218 lb (98.9 kg)  03/06/22 217 lb 4 oz (98.5 kg)      Physical Exam Vitals and nursing note reviewed.  Constitutional:      Appearance: Normal appearance. He is not ill-appearing.  HENT:     Mouth/Throat:     Mouth: Mucous membranes are moist.     Pharynx: Oropharynx is clear. No oropharyngeal exudate or posterior oropharyngeal erythema.  Eyes:     Extraocular Movements: Extraocular movements intact.     Conjunctiva/sclera: Conjunctivae normal.     Pupils: Pupils are equal, round, and reactive to light.  Cardiovascular:     Rate and Rhythm: Normal rate and regular rhythm.     Pulses: Normal pulses.     Heart sounds: Murmur (2/6 systolic) heard.  Pulmonary:     Effort: Pulmonary effort is normal. No respiratory distress.     Breath sounds: Normal breath sounds. No wheezing, rhonchi or rales.  Musculoskeletal:     Right lower leg: Edema (tr) present.     Left lower leg: Edema (tr) present.  Skin:    General: Skin is warm and dry.     Findings: No rash.  Neurological:     Mental Status: He is alert.  Psychiatric:        Mood and Affect: Mood normal.        Behavior: Behavior normal.       Results for orders placed or performed during the hospital encounter of 03/29/22  Ethanol  Result Value Ref Range   Alcohol, Ethyl (B) <10 <10 mg/dL  Protime-INR  Result Value Ref Range   Prothrombin Time 13.9 11.4 - 15.2 seconds   INR 1.1 0.8 - 1.2  APTT  Result  Value Ref Range   aPTT 39 (H) 24 - 36 seconds  CBC  Result Value Ref Range   WBC 4.3 4.0 - 10.5 K/uL   RBC 3.87 (L) 4.22 - 5.81 MIL/uL   Hemoglobin 12.3 (L) 13.0 - 17.0 g/dL   HCT 16.1 (L) 09.6 - 04.5 %   MCV 95.1 80.0 - 100.0 fL   MCH  31.8 26.0 - 34.0 pg   MCHC 33.4 30.0 - 36.0 g/dL   RDW 16.1 09.6 - 04.5 %   Platelets 229 150 - 400 K/uL   nRBC 0.0 0.0 - 0.2 %  Differential  Result Value Ref Range   Neutrophils Relative % 59 %   Neutro Abs 2.5 1.7 - 7.7 K/uL   Lymphocytes Relative 28 %   Lymphs Abs 1.2 0.7 - 4.0 K/uL   Monocytes Relative 9 %   Monocytes Absolute 0.4 0.1 - 1.0 K/uL   Eosinophils Relative 3 %   Eosinophils Absolute 0.1 0.0 - 0.5 K/uL   Basophils Relative 1 %   Basophils Absolute 0.1 0.0 - 0.1 K/uL   Immature Granulocytes 0 %   Abs Immature Granulocytes 0.01 0.00 - 0.07 K/uL  Comprehensive metabolic panel  Result Value Ref Range   Sodium 139 135 - 145 mmol/L   Potassium 4.0 3.5 - 5.1 mmol/L   Chloride 109 98 - 111 mmol/L   CO2 25 22 - 32 mmol/L   Glucose, Bld 102 (H) 70 - 99 mg/dL   BUN 29 (H) 8 - 23 mg/dL   Creatinine, Ser 4.09 0.61 - 1.24 mg/dL   Calcium 9.0 8.9 - 81.1 mg/dL   Total Protein 7.2 6.5 - 8.1 g/dL   Albumin 4.1 3.5 - 5.0 g/dL   AST 27 15 - 41 U/L   ALT 19 0 - 44 U/L   Alkaline Phosphatase 46 38 - 126 U/L   Total Bilirubin 0.6 0.3 - 1.2 mg/dL   GFR, Estimated >91 >47 mL/min   Anion gap 5 5 - 15  Urine Drug Screen, Qualitative  Result Value Ref Range   Tricyclic, Ur Screen NONE DETECTED NONE DETECTED   Amphetamines, Ur Screen NONE DETECTED NONE DETECTED   MDMA (Ecstasy)Ur Screen NONE DETECTED NONE DETECTED   Cocaine Metabolite,Ur [place] NONE DETECTED NONE DETECTED   Opiate, Ur Screen NONE DETECTED NONE DETECTED   Phencyclidine (PCP) Ur S NONE DETECTED NONE DETECTED   Cannabinoid 50 Ng, Ur [place] NONE DETECTED NONE DETECTED   Barbiturates, Ur Screen NONE DETECTED NONE DETECTED   Benzodiazepine, Ur Scrn NONE DETECTED NONE DETECTED   Methadone  Scn, Ur NONE DETECTED NONE DETECTED  Urinalysis, Routine w reflex microscopic Urine, Clean Catch  Result Value Ref Range   Color, Urine STRAW (A) YELLOW   APPearance CLEAR (A) CLEAR   Specific Gravity, Urine 1.012 1.005 - 1.030   pH 6.0 5.0 - 8.0   Glucose, UA NEGATIVE NEGATIVE mg/dL   Hgb urine dipstick NEGATIVE NEGATIVE   Bilirubin Urine NEGATIVE NEGATIVE   Ketones, ur NEGATIVE NEGATIVE mg/dL   Protein, ur NEGATIVE NEGATIVE mg/dL   Nitrite NEGATIVE NEGATIVE   Leukocytes,Ua NEGATIVE NEGATIVE  CBG monitoring, ED  Result Value Ref Range   Glucose-Capillary 106 (H) 70 - 99 mg/dL    Assessment & Plan:   Problem List Items Addressed This Visit     HTN (hypertension) - Primary    Chronic, deteriorated.  Continue amlodipine 10mg , change hydralazine to 50mg  BID.  Start monitoring BP at home, log provided today.  RTC 3 mo HTN f/u visit.       Relevant Medications   hydrALAZINE (APRESOLINE) 50 MG tablet   atorvastatin (LIPITOR) 10 MG tablet   Carotid stenosis, asymptomatic, bilateral    Continue plavix daily. Trial low dose atorvastatin 10mg .       Relevant Medications   hydrALAZINE (APRESOLINE) 50 MG tablet   atorvastatin (LIPITOR) 10 MG  tablet   Nocturnal leg cramps    Longstanding, managed with OTC magnesium and quinine water. He's also previously used homeopathic cramping pills. Update electrolytes and CPK.       Relevant Orders   Magnesium   CK   CBC with Differential/Platelet   Basic metabolic panel   TSH   Dyslipidemia    FLP normal off meds. Trial low dose atorvastatin 10mg  given h/o carotid disease and ischemic stroke.  Monitor for worsening myalgias.       Relevant Medications   atorvastatin (LIPITOR) 10 MG tablet   MCI (mild cognitive impairment)    Continue low dose aricept 5mg  nightly. Consider updated MMSE next visit.       Systolic murmur    Very mild      History of cerebellar stroke     Meds ordered this encounter  Medications    hydrALAZINE (APRESOLINE) 50 MG tablet    Sig: Take 1 tablet (50 mg total) by mouth in the morning and at bedtime.    Dispense:  180 tablet    Refill:  1    Note new dose   atorvastatin (LIPITOR) 10 MG tablet    Sig: Take 1 tablet (10 mg total) by mouth daily.    Dispense:  30 tablet    Refill:  6    Orders Placed This Encounter  Procedures   Magnesium   CK   CBC with Differential/Platelet   Basic metabolic panel   TSH    Patient Instructions  Labs today  Blood pressure was too high today - start checking blood pressures at home, log provided.  Continue amlodipine 10mg  daily, change hydralazine to 50mg  twice daily (2 tablets of 25mg  until you run out, new dose will be 50mg  tablets).  Trial new cholesterol medicine atorvastatin 10mg  daily - watch for worse muscle aches.  Continue donepezil (aricept) 5mg  nightly Return in 3 months for follow up visit   Follow up plan: Return in about 3 months (around 12/05/2022) for follow up visit.  Eustaquio Boyden, MD

## 2022-09-05 NOTE — Assessment & Plan Note (Signed)
Very mild.  

## 2022-09-05 NOTE — Assessment & Plan Note (Addendum)
FLP normal off meds. Trial low dose atorvastatin 10mg  given h/o carotid disease and ischemic stroke.  Monitor for worsening myalgias.

## 2022-09-05 NOTE — Patient Instructions (Addendum)
Labs today  Blood pressure was too high today - start checking blood pressures at home, log provided.  Continue amlodipine 10mg  daily, change hydralazine to 50mg  twice daily (2 tablets of 25mg  until you run out, new dose will be 50mg  tablets).  Trial new cholesterol medicine atorvastatin 10mg  daily - watch for worse muscle aches.  Continue donepezil (aricept) 5mg  nightly Return in 3 months for follow up visit

## 2022-09-05 NOTE — Assessment & Plan Note (Addendum)
Chronic, deteriorated.  Continue amlodipine 10mg , change hydralazine to 50mg  BID.  Start monitoring BP at home, log provided today.  RTC 3 mo HTN f/u visit.

## 2022-09-05 NOTE — Assessment & Plan Note (Addendum)
Continue low dose aricept 5mg  nightly. Consider updated MMSE next visit.

## 2022-09-05 NOTE — Assessment & Plan Note (Signed)
Longstanding, managed with OTC magnesium and quinine water. He's also previously used homeopathic cramping pills. Update electrolytes and CPK.

## 2022-09-08 ENCOUNTER — Other Ambulatory Visit: Payer: Self-pay | Admitting: Family Medicine

## 2022-09-08 ENCOUNTER — Encounter: Payer: Self-pay | Admitting: Family Medicine

## 2022-09-08 DIAGNOSIS — R748 Abnormal levels of other serum enzymes: Secondary | ICD-10-CM | POA: Insufficient documentation

## 2022-09-08 MED ORDER — COENZYME Q10 100 MG PO CAPS: 100.0000 mg | ORAL_CAPSULE | Freq: Every day | ORAL | Status: DC

## 2022-09-27 DIAGNOSIS — K080 Exfoliation of teeth due to systemic causes: Secondary | ICD-10-CM | POA: Diagnosis not present

## 2022-10-11 ENCOUNTER — Other Ambulatory Visit: Payer: Self-pay | Admitting: Family Medicine

## 2022-10-11 DIAGNOSIS — I10 Essential (primary) hypertension: Secondary | ICD-10-CM

## 2022-10-26 ENCOUNTER — Ambulatory Visit: Payer: Medicare Other | Admitting: Dermatology

## 2022-10-26 DIAGNOSIS — L578 Other skin changes due to chronic exposure to nonionizing radiation: Secondary | ICD-10-CM

## 2022-10-26 DIAGNOSIS — D229 Melanocytic nevi, unspecified: Secondary | ICD-10-CM

## 2022-10-26 DIAGNOSIS — L814 Other melanin hyperpigmentation: Secondary | ICD-10-CM

## 2022-10-26 DIAGNOSIS — W908XXA Exposure to other nonionizing radiation, initial encounter: Secondary | ICD-10-CM | POA: Diagnosis not present

## 2022-10-26 DIAGNOSIS — D1801 Hemangioma of skin and subcutaneous tissue: Secondary | ICD-10-CM

## 2022-10-26 DIAGNOSIS — L820 Inflamed seborrheic keratosis: Secondary | ICD-10-CM

## 2022-10-26 DIAGNOSIS — Z8589 Personal history of malignant neoplasm of other organs and systems: Secondary | ICD-10-CM

## 2022-10-26 DIAGNOSIS — Z1283 Encounter for screening for malignant neoplasm of skin: Secondary | ICD-10-CM

## 2022-10-26 DIAGNOSIS — C44722 Squamous cell carcinoma of skin of right lower limb, including hip: Secondary | ICD-10-CM

## 2022-10-26 DIAGNOSIS — L570 Actinic keratosis: Secondary | ICD-10-CM

## 2022-10-26 DIAGNOSIS — C4492 Squamous cell carcinoma of skin, unspecified: Secondary | ICD-10-CM

## 2022-10-26 DIAGNOSIS — D485 Neoplasm of uncertain behavior of skin: Secondary | ICD-10-CM

## 2022-10-26 DIAGNOSIS — Z85828 Personal history of other malignant neoplasm of skin: Secondary | ICD-10-CM

## 2022-10-26 DIAGNOSIS — L821 Other seborrheic keratosis: Secondary | ICD-10-CM

## 2022-10-26 HISTORY — DX: Squamous cell carcinoma of skin, unspecified: C44.92

## 2022-10-26 NOTE — Patient Instructions (Signed)
Electrodesiccation and Curettage ("Scrape and Burn") Wound Care Instructions  Leave the original bandage on for 24 hours if possible.  If the bandage becomes soaked or soiled before that time, it is OK to remove it and examine the wound.  A small amount of post-operative bleeding is normal.  If excessive bleeding occurs, remove the bandage, place gauze over the site and apply continuous pressure (no peeking) over the area for 30 minutes. If this does not work, please call our clinic as soon as possible or page your doctor if it is after hours.   Once a day, cleanse the wound with soap and water. It is fine to shower. If a thick crust develops you may use a Q-tip dipped into dilute hydrogen peroxide (mix 1:1 with water) to dissolve it.  Hydrogen peroxide can slow the healing process, so use it only as needed.    After washing, apply petroleum jelly (Vaseline) or an antibiotic ointment if your doctor prescribed one for you, followed by a bandage.    For best healing, the wound should be covered with a layer of ointment at all times. If you are not able to keep the area covered with a bandage to hold the ointment in place, this may mean re-applying the ointment several times a day.  Continue this wound care until the wound has healed and is no longer open. It may take several weeks for the wound to heal and close.  Itching and mild discomfort is normal during the healing process.  If you have any discomfort, you can take Tylenol (acetaminophen) or ibuprofen as directed on the bottle. (Please do not take these if you have an allergy to them or cannot take them for another reason).  Some redness, tenderness and white or yellow material in the wound is normal healing.  If the area becomes very sore and red, or develops a thick yellow-green material (pus), it may be infected; please notify us.    Wound healing continues for up to one year following surgery. It is not unusual to experience pain in the scar  from time to time during the interval.  If the pain becomes severe or the scar thickens, you should notify the office.    A slight amount of redness in a scar is expected for the first six months.  After six months, the redness will fade and the scar will soften and fade.  The color difference becomes less noticeable with time.  If there are any problems, return for a post-op surgery check at your earliest convenience.  To improve the appearance of the scar, you can use silicone scar gel, cream, or sheets (such as Mederma or Serica) every night for up to one year. These are available over the counter (without a prescription).  Please call our office at [phone] for any questions or concerns.     Due to recent changes in healthcare laws, you may see results of your pathology and/or laboratory studies on MyChart before the doctors have had a chance to review them. We understand that in some cases there may be results that are confusing or concerning to you. Please understand that not all results are received at the same time and often the doctors may need to interpret multiple results in order to provide you with the best plan of care or course of treatment. Therefore, we ask that you please give us 2 business days to thoroughly review all your results before contacting the office for clarification. Should   we see a critical lab result, you will be contacted sooner.   If You Need Anything After Your Visit  If you have any questions or concerns for your doctor, please call our main line at [phone] and press option 4 to reach your doctor's medical assistant. If no one answers, please leave a voicemail as directed and we will return your call as soon as possible. Messages left after 4 pm will be answered the following business day.   You may also send us a message via MyChart. We typically respond to MyChart messages within 1-2 business days.  For prescription refills, please ask your  pharmacy to contact our office. Our fax number is [phone].  If you have an urgent issue when the clinic is closed that cannot wait until the next business day, you can page your doctor at the number below.    Please note that while we do our best to be available for urgent issues outside of office hours, we are not available 24/7.   If you have an urgent issue and are unable to reach us, you may choose to seek medical care at your doctor's office, retail clinic, urgent care center, or emergency room.  If you have a medical emergency, please immediately call 911 or go to the emergency department.  Pager Numbers  - Dr. Kowalski: [phone]  - Dr. Moye: [phone]  - Dr. Stewart: [phone]  In the event of inclement weather, please call our main line at [phone] for an update on the status of any delays or closures.  Dermatology Medication Tips: Please keep the boxes that topical medications come in in order to help keep track of the instructions about where and how to use these. Pharmacies typically print the medication instructions only on the boxes and not directly on the medication tubes.   If your medication is too expensive, please contact our office at [phone] option 4 or send us a message through MyChart.   We are unable to tell what your co-pay for medications will be in advance as this is different depending on your insurance coverage. However, we may be able to find a substitute medication at lower cost or fill out paperwork to get insurance to cover a needed medication.   If a prior authorization is required to get your medication covered by your insurance company, please allow us 1-2 business days to complete this process.  Drug prices often vary depending on where the prescription is filled and some pharmacies may offer cheaper prices.  The website www.goodrx.com contains coupons for medications through different pharmacies. The prices here do not  account for what the cost may be with help from insurance (it may be cheaper with your insurance), but the website can give you the price if you did not use any insurance.  - You can print the associated coupon and take it with your prescription to the pharmacy.  - You may also stop by our office during regular business hours and pick up a GoodRx coupon card.  - If you need your prescription sent electronically to a different pharmacy, notify our office through [facility] MyChart or by phone at [phone] option 4.     Si Usted Necesita Algo Despus de Su Visita  Tambin puede enviarnos un mensaje a travs de MyChart. Por lo general respondemos a los mensajes de MyChart en el transcurso de 1 a 2 das hbiles.  Para renovar recetas, por favor pida a su farmacia que se ponga en   contacto con nuestra oficina. Nuestro nmero de fax es el [phone].  Si tiene un asunto urgente cuando la clnica est cerrada y que no puede esperar hasta el siguiente da hbil, puede llamar/localizar a su doctor(a) al nmero que aparece a continuacin.   Por favor, tenga en cuenta que aunque hacemos todo lo posible para estar disponibles para asuntos urgentes fuera del horario de oficina, no estamos disponibles las 24 horas del da, los 7 das de la semana.   Si tiene un problema urgente y no puede comunicarse con nosotros, puede optar por buscar atencin mdica  en el consultorio de su doctor(a), en una clnica privada, en un centro de atencin urgente o en una sala de emergencias.  Si tiene una emergencia mdica, por favor llame inmediatamente al 911 o vaya a la sala de emergencias.  Nmeros de bper  - Dr. Kowalski: [phone]  - Dra. Moye: [phone]  - Dra. Stewart: [phone]  En caso de inclemencias del tiempo, por favor llame a nuestra lnea principal al [phone] para una actualizacin sobre el estado de cualquier retraso o cierre.  Consejos para la medicacin en dermatologa: Por  favor, guarde las cajas en las que vienen los medicamentos de uso tpico para ayudarle a seguir las instrucciones sobre dnde y cmo usarlos. Las farmacias generalmente imprimen las instrucciones del medicamento slo en las cajas y no directamente en los tubos del medicamento.   Si su medicamento es muy caro, por favor, pngase en contacto con nuestra oficina llamando al [phone] y presione la opcin 4 o envenos un mensaje a travs de MyChart.   No podemos decirle cul ser su copago por los medicamentos por adelantado ya que esto es diferente dependiendo de la cobertura de su seguro. Sin embargo, es posible que podamos encontrar un medicamento sustituto a menor costo o llenar un formulario para que el seguro cubra el medicamento que se considera necesario.   Si se requiere una autorizacin previa para que su compaa de seguros cubra su medicamento, por favor permtanos de 1 a 2 das hbiles para completar este proceso.  Los precios de los medicamentos varan con frecuencia dependiendo del lugar de dnde se surte la receta y alguna farmacias pueden ofrecer precios ms baratos.  El sitio web www.goodrx.com tiene cupones para medicamentos de diferentes farmacias. Los precios aqu no tienen en cuenta lo que podra costar con la ayuda del seguro (puede ser ms barato con su seguro), pero el sitio web puede darle el precio si no utiliz ningn seguro.  - Puede imprimir el cupn correspondiente y llevarlo con su receta a la farmacia.  - Tambin puede pasar por nuestra oficina durante el horario de atencin regular y recoger una tarjeta de cupones de GoodRx.  - Si necesita que su receta se enve electrnicamente a una farmacia diferente, informe a nuestra oficina a travs de MyChart de [facility] o por telfono llamando al [phone] y presione la opcin 4.  

## 2022-10-26 NOTE — Progress Notes (Unsigned)
Follow-Up Visit   Subjective  [name] is a 89 y.o. male who presents for the following: Skin Cancer Screening and Full Body Skin Exam The patient presents for Total-Body Skin Exam (TBSE) for skin cancer screening and mole check. The patient has spots, moles and lesions to be evaluated, some may be new or changing and the patient has concerns that these could be cancer.  The following portions of the chart were reviewed this encounter and updated as appropriate: medications, allergies, medical history  Review of Systems:  No other skin or systemic complaints except as noted in HPI or Assessment and Plan.  Objective  Well appearing patient in no apparent distress; mood and affect are within normal limits.  A full examination was performed including scalp, head, eyes, ears, nose, lips, neck, chest, axillae, abdomen, back, buttocks, bilateral upper extremities, bilateral lower extremities, hands, feet, fingers, toes, fingernails, and toenails. All findings within normal limits unless otherwise noted below.   Relevant physical exam findings are noted in the Assessment and Plan.  Face and ears x 9 (9) Erythematous thin papules/macules with gritty scale.   R med knee Hyperkeratotic papule 0.8 cm      hands and arms x 6 (6) Erythematous stuck-on, waxy papule or plaque   Assessment & Plan   LENTIGINES, SEBORRHEIC KERATOSES, HEMANGIOMAS - Benign normal skin lesions - Benign-appearing - Call for any changes  MELANOCYTIC NEVI - Tan-brown and/or pink-flesh-colored symmetric macules and papules - Benign appearing on exam today - Observation - Call clinic for new or changing moles - Recommend daily use of broad spectrum spf 30+ sunscreen to sun-exposed areas.   ACTINIC DAMAGE - Chronic condition, secondary to cumulative UV/sun exposure - diffuse scaly erythematous macules with underlying dyspigmentation - Recommend daily broad spectrum sunscreen SPF 30+ to sun-exposed areas,  reapply every 2 hours as needed.  - Staying in the shade or wearing long sleeves, sun glasses (UVA+UVB protection) and wide brim hats (4-inch brim around the entire circumference of the hat) are also recommended for sun protection.  - Call for new or changing lesions.  HISTORY OF SQUAMOUS CELL CARCINOMA OF THE SKIN - No evidence of recurrence today - No lymphadenopathy - Recommend regular full body skin exams - Recommend daily broad spectrum sunscreen SPF 30+ to sun-exposed areas, reapply every 2 hours as needed.  - Call if any new or changing lesions are noted between office visits  AK (actinic keratosis) (9) Face and ears x 9  Destruction of lesion - Face and ears x 9 Complexity: simple   Destruction method: cryotherapy   Informed consent: discussed and consent obtained   Timeout:  patient name, date of birth, surgical site, and procedure verified Lesion destroyed using liquid nitrogen: Yes   Region frozen until ice ball extended beyond lesion: Yes   Outcome: patient tolerated procedure well with no complications   Post-procedure details: wound care instructions given    Neoplasm of uncertain behavior of skin R med knee  Epidermal / dermal shaving  Lesion diameter (cm):  0.8 Informed consent: discussed and consent obtained   Timeout: patient name, date of birth, surgical site, and procedure verified   Procedure prep:  Patient was prepped and draped in usual sterile fashion Prep type:  Isopropyl alcohol Anesthesia: the lesion was anesthetized in a standard fashion   Anesthetic:  1% lidocaine w/ epinephrine 1-100,000 buffered w/ 8.4% NaHCO3 Instrument used: flexible razor blade   Hemostasis achieved with: pressure, aluminum chloride and electrodesiccation  Outcome: patient tolerated procedure well   Post-procedure details: sterile dressing applied and wound care instructions given   Dressing type: bandage and petrolatum    Destruction of lesion Complexity: extensive    Destruction method: electrodesiccation and curettage   Informed consent: discussed and consent obtained   Timeout:  patient name, date of birth, surgical site, and procedure verified Procedure prep:  Patient was prepped and draped in usual sterile fashion Prep type:  Isopropyl alcohol Anesthesia: the lesion was anesthetized in a standard fashion   Anesthetic:  1% lidocaine w/ epinephrine 1-100,000 buffered w/ 8.4% NaHCO3 Curettage performed in three different directions: Yes   Electrodesiccation performed over the curetted area: Yes   Lesion length (cm):  0.8 Lesion width (cm):  0.8 Margin per side (cm):  0.2 Final wound size (cm):  1.2 Hemostasis achieved with:  pressure, aluminum chloride and electrodesiccation Outcome: patient tolerated procedure well with no complications   Post-procedure details: sterile dressing applied and wound care instructions given   Dressing type: bandage and petrolatum    Specimen 1 - Surgical pathology Differential Diagnosis: D48.5 r/o SCC vs other  ED&C today Check Margins: No  Inflamed seborrheic keratosis (6) hands and arms x 6  Symptomatic, irritating, patient would like treated.   Destruction of lesion - hands and arms x 6 Complexity: simple   Destruction method: cryotherapy   Informed consent: discussed and consent obtained   Timeout:  patient name, date of birth, surgical site, and procedure verified Lesion destroyed using liquid nitrogen: Yes   Region frozen until ice ball extended beyond lesion: Yes   Outcome: patient tolerated procedure well with no complications   Post-procedure details: wound care instructions given    Skin cancer screening  Actinic skin damage  Lentigo  Melanocytic nevus, unspecified location  Seborrheic keratosis  History of squamous cell carcinoma  SKIN CANCER SCREENING PERFORMED TODAY.  Return in about 6 months (around 04/27/2023) for TBSE.  Maylene Roes, CMA, am acting as scribe for Armida Sans, MD .  Documentation: I have reviewed the above documentation for accuracy and completeness, and I agree with the above.  Armida Sans, MD

## 2022-10-29 ENCOUNTER — Encounter: Payer: Self-pay | Admitting: Dermatology

## 2022-10-31 ENCOUNTER — Telehealth: Payer: Self-pay

## 2022-10-31 NOTE — Telephone Encounter (Signed)
-----   Message from Deirdre Evener, MD sent at 10/31/2022 11:56 AM EDT ----- Diagnosis Skin , right med knee WELL DIFFERENTIATED SQUAMOUS CELL CARCINOMA WITH SUPERFICIAL INFILTRATION  Cancer = SCC Already treated Recheck next visit

## 2022-10-31 NOTE — Telephone Encounter (Signed)
Left pt msg to call for bx result/sh °

## 2022-11-01 ENCOUNTER — Telehealth: Payer: Self-pay

## 2022-11-01 NOTE — Telephone Encounter (Signed)
Patient advised pathology showed SCC, already treated at time of biopsy. Will recheck on follow up. Butch Penny., RMA

## 2022-11-06 DIAGNOSIS — M955 Acquired deformity of pelvis: Secondary | ICD-10-CM | POA: Diagnosis not present

## 2022-11-06 DIAGNOSIS — M5416 Radiculopathy, lumbar region: Secondary | ICD-10-CM | POA: Diagnosis not present

## 2022-11-06 DIAGNOSIS — M9903 Segmental and somatic dysfunction of lumbar region: Secondary | ICD-10-CM | POA: Diagnosis not present

## 2022-11-06 DIAGNOSIS — M9905 Segmental and somatic dysfunction of pelvic region: Secondary | ICD-10-CM | POA: Diagnosis not present

## 2022-11-07 DIAGNOSIS — E785 Hyperlipidemia, unspecified: Secondary | ICD-10-CM | POA: Diagnosis not present

## 2022-11-07 DIAGNOSIS — K219 Gastro-esophageal reflux disease without esophagitis: Secondary | ICD-10-CM | POA: Diagnosis not present

## 2022-11-07 DIAGNOSIS — E669 Obesity, unspecified: Secondary | ICD-10-CM | POA: Diagnosis not present

## 2022-11-07 DIAGNOSIS — J309 Allergic rhinitis, unspecified: Secondary | ICD-10-CM | POA: Diagnosis not present

## 2022-11-07 DIAGNOSIS — I10 Essential (primary) hypertension: Secondary | ICD-10-CM | POA: Diagnosis not present

## 2022-11-07 DIAGNOSIS — Z7982 Long term (current) use of aspirin: Secondary | ICD-10-CM | POA: Diagnosis not present

## 2022-11-07 DIAGNOSIS — H1045 Other chronic allergic conjunctivitis: Secondary | ICD-10-CM | POA: Diagnosis not present

## 2022-11-13 DIAGNOSIS — M5416 Radiculopathy, lumbar region: Secondary | ICD-10-CM | POA: Diagnosis not present

## 2022-11-13 DIAGNOSIS — M9905 Segmental and somatic dysfunction of pelvic region: Secondary | ICD-10-CM | POA: Diagnosis not present

## 2022-11-13 DIAGNOSIS — M955 Acquired deformity of pelvis: Secondary | ICD-10-CM | POA: Diagnosis not present

## 2022-11-13 DIAGNOSIS — M9903 Segmental and somatic dysfunction of lumbar region: Secondary | ICD-10-CM | POA: Diagnosis not present

## 2022-12-05 ENCOUNTER — Ambulatory Visit (INDEPENDENT_AMBULATORY_CARE_PROVIDER_SITE_OTHER): Payer: Medicare Other | Admitting: Family Medicine

## 2022-12-05 ENCOUNTER — Encounter: Payer: Self-pay | Admitting: Family Medicine

## 2022-12-05 VITALS — BP 160/62 | HR 56 | Temp 97.4°F | Ht 69.0 in | Wt 214.2 lb

## 2022-12-05 DIAGNOSIS — G3184 Mild cognitive impairment, so stated: Secondary | ICD-10-CM

## 2022-12-05 DIAGNOSIS — I10 Essential (primary) hypertension: Secondary | ICD-10-CM | POA: Diagnosis not present

## 2022-12-05 DIAGNOSIS — R748 Abnormal levels of other serum enzymes: Secondary | ICD-10-CM

## 2022-12-05 DIAGNOSIS — I440 Atrioventricular block, first degree: Secondary | ICD-10-CM

## 2022-12-05 DIAGNOSIS — E785 Hyperlipidemia, unspecified: Secondary | ICD-10-CM

## 2022-12-05 DIAGNOSIS — K219 Gastro-esophageal reflux disease without esophagitis: Secondary | ICD-10-CM | POA: Diagnosis not present

## 2022-12-05 LAB — BASIC METABOLIC PANEL
BUN: 23 mg/dL (ref 6–23)
CO2: 27 mEq/L (ref 19–32)
Calcium: 9.2 mg/dL (ref 8.4–10.5)
Chloride: 103 mEq/L (ref 96–112)
Creatinine, Ser: 1.14 mg/dL (ref 0.40–1.50)
GFR: 57.24 mL/min — ABNORMAL LOW (ref >60.00)
Glucose, Bld: 98 mg/dL (ref 70–99)
Potassium: 4.4 mEq/L (ref 3.5–5.1)
Sodium: 139 mEq/L (ref 135–145)

## 2022-12-05 LAB — LIPID PANEL
Cholesterol: 157 mg/dL (ref 0–200)
HDL: 43.3 mg/dL (ref >39.00)
LDL Cholesterol: 83 mg/dL (ref 0–99)
NonHDL: 113.61
Total CHOL/HDL Ratio: 4
Triglycerides: 155 mg/dL — ABNORMAL HIGH (ref 0.0–149.0)
VLDL: 31 mg/dL (ref 0.0–40.0)

## 2022-12-05 LAB — CK: Total CK: 93 U/L (ref 7–232)

## 2022-12-05 MED ORDER — VITAMIN B-12 1000 MCG PO TABS: 1000.0000 ug | ORAL_TABLET | Freq: Every day | ORAL | Status: AC

## 2022-12-05 MED ORDER — METOPROLOL SUCCINATE ER 25 MG PO TB24: 12.5000 mg | ORAL_TABLET | Freq: Every day | ORAL | 3 refills | Status: DC

## 2022-12-05 MED ORDER — HYDRALAZINE HCL 50 MG PO TABS
50.0000 mg | ORAL_TABLET | Freq: Three times a day (TID) | ORAL | 1 refills | Status: DC
Start: 2022-12-05 — End: 2023-04-13

## 2022-12-05 MED ORDER — MAGNESIUM 500 MG PO CAPS: 1.0000 | ORAL_CAPSULE | Freq: Every day | ORAL | Status: DC

## 2022-12-05 MED ORDER — POTASSIUM GLUCONATE 550 (90 K) MG PO TABS: 1.0000 | ORAL_TABLET | Freq: Every day | ORAL | Status: DC

## 2022-12-05 NOTE — Patient Instructions (Addendum)
Take tylenol (Acetaminophen) in place of ibuprofen for body pain/joint pain because ibuprofen can interact with plavix.  Cut metoprolol to 1/2 tablet daily.  Increase hydralazine to three times a day.  Stop esomeprazole, continue only pantoprazole for reflux/heartburn.  Good to see you today Return in 3 months for physical/wellness visit

## 2022-12-05 NOTE — Assessment & Plan Note (Signed)
Continues low dose aricept. He notes ongoing memory difficulty. Consider updated MMSA, consider namenda.  Hesitant to increase aricept dose given possible altered cardiac conduction

## 2022-12-05 NOTE — Assessment & Plan Note (Signed)
Update CPK off statin

## 2022-12-05 NOTE — Assessment & Plan Note (Addendum)
Atorvastatin stopped due to elevated CPK - update CPK levels and lipid levels.  Previously started 08/2022 due to h/o carotid disease and ischemic stroke

## 2022-12-05 NOTE — Assessment & Plan Note (Signed)
He has continued Toprol XL 25mg  daily. Will drop to 12.5mg  daily.

## 2022-12-05 NOTE — Progress Notes (Signed)
Ph: [phone] Fax: [phone]   Patient ID: [name], male    DOB: [birth year], 88 y.o.   MRN: [id number]  This visit was conducted in person.  BP (!) 160/62 (BP Location: Right Arm, Cuff Size: Normal)   Pulse (!) 56   Temp (!) 97.4 F (36.3 C) (Temporal)   Ht 5\' 9"  (1.753 m)   Wt 214 lb 4 oz (97.2 kg)   SpO2 97%   BMI 31.64 kg/m   BP Readings from Last 3 Encounters:  12/05/22 (!) 160/62  09/05/22 (!) 160/70  03/29/22 (!) 171/71   CC: HTN f/u visit  Subjective:   HPI: [name] is a 89 y.o. male presenting on 12/05/2022 for Medical Management of Chronic Issues (Here for 3 mo HTN f/u.)   He brings in all meds vitamins and supplements. Med rec performed.   HTN - Compliant with current antihypertensive regimen of amlodipine 10mg  daily, hydralazine 50mg  BID. Avoiding BB in bradycardia history - however he's continued taking toprol XL 25mg  daily. Does check blood pressures at home and brings log: AM 150-170/60-70s, PM 128-152/60s, HR 50-60s. No low blood pressure readings or symptoms of dizziness/syncope.  Denies HA, vision changes, CP/tightness, SOB, leg swelling.   Last visit we started atorvastatin 10mg  daily. He does have mildly elevated CPK levels when last checked. Statin was stopped due to this.   S/p stroke 01/2022 presenting with imbalance/dizziness. Brain imaging at that time showed ischemic nonhemorrhagic infarct of central cerebellum, treated with DAPT x 3 mo then plavix alone. Found to have RICA 70%, LICA 50% stenosis, as well as R subclavian stenosis 85% at origin and occluded L vertebral artery - thought noncontributory. Saw cardiology and neurology in follow up - f/u planned PRN.      Relevant past medical, surgical, family and social history reviewed and updated as indicated. Interim medical history since our last visit reviewed. Allergies and medications reviewed and updated. Outpatient Medications Prior to Visit  Medication Sig Dispense Refill    acetaminophen (TYLENOL) 500 MG tablet Take 1-2 tablets (500-1,000 mg total) by mouth 2 (two) times daily as needed for moderate pain. (Patient taking differently: Take 500-1,000 mg by mouth 2 (two) times daily as needed for moderate pain. Pt states taking 3 tablets)     allopurinol (ZYLOPRIM) 100 MG tablet TAKE 2 TABLETS(200 MG) BY MOUTH DAILY 180 tablet 3   amLODipine (NORVASC) 10 MG tablet TAKE 1 TABLET(10 MG) BY MOUTH DAILY 90 tablet 1   aspirin EC 81 MG tablet Take 81 mg by mouth daily.     azelastine (ASTELIN) 0.1 % nasal spray Place 1-2 sprays into both nostrils 2 (two) times daily. Use in each nostril as directed 30 mL 6   carboxymethylcellulose (REFRESH TEARS) 0.5 % SOLN Place 1 drop into both eyes daily as needed (dry eyes).     cetirizine (ZYRTEC) 10 MG tablet Take 10 mg by mouth daily. Alternates weekly with allegra.     clopidogrel (PLAVIX) 75 MG tablet TAKE 1 TABLET(75 MG) BY MOUTH DAILY 90 tablet 1   Coenzyme Q10 100 MG capsule Take 1 capsule (100 mg total) by mouth daily.     donepezil (ARICEPT) 5 MG tablet TAKE 1 TABLET(5 MG) BY MOUTH AT BEDTIME 30 tablet 8   fexofenadine (ALLEGRA) 180 MG tablet Take 180 mg by mouth daily. Alternates weekly with zyrtec.     GLUCOSAMINE-CHONDROITIN PO Take 2 tablets by mouth daily.     Multiple Vitamins-Minerals (MULTIVITAMIN ADULT PO)  Take 1 tablet by mouth daily.     pantoprazole (PROTONIX) 20 MG tablet Take 1 tablet (20 mg total) by mouth daily. 90 tablet 1   polyethylene glycol (MIRALAX / GLYCOLAX) packet Take 8.5 g by mouth daily.      atorvastatin (LIPITOR) 10 MG tablet Take 1 tablet (10 mg total) by mouth daily. 30 tablet 6   hydrALAZINE (APRESOLINE) 50 MG tablet Take 1 tablet (50 mg total) by mouth in the morning and at bedtime. 180 tablet 1   Magnesium 250 MG TABS Take 1 tablet (250 mg total) by mouth daily.  0   No facility-administered medications prior to visit.     Per HPI unless specifically indicated in ROS section below Review  of Systems  Objective:  BP (!) 160/62 (BP Location: Right Arm, Cuff Size: Normal)   Pulse (!) 56   Temp (!) 97.4 F (36.3 C) (Temporal)   Ht 5\' 9"  (1.753 m)   Wt 214 lb 4 oz (97.2 kg)   SpO2 97%   BMI 31.64 kg/m   Wt Readings from Last 3 Encounters:  12/05/22 214 lb 4 oz (97.2 kg)  09/05/22 217 lb 2 oz (98.5 kg)  03/29/22 218 lb (98.9 kg)      Physical Exam Vitals and nursing note reviewed.  Constitutional:      Appearance: Normal appearance. He is not ill-appearing.  HENT:     Mouth/Throat:     Mouth: Mucous membranes are moist.     Pharynx: Oropharynx is clear. No oropharyngeal exudate or posterior oropharyngeal erythema.  Eyes:     Extraocular Movements: Extraocular movements intact.     Conjunctiva/sclera: Conjunctivae normal.     Pupils: Pupils are equal, round, and reactive to light.  Cardiovascular:     Rate and Rhythm: Normal rate and regular rhythm.     Pulses: Normal pulses.     Heart sounds: Normal heart sounds. No murmur heard. Pulmonary:     Effort: Pulmonary effort is normal. No respiratory distress.     Breath sounds: Normal breath sounds. No wheezing, rhonchi or rales.  Musculoskeletal:     Right lower leg: No edema.     Left lower leg: No edema.  Skin:    General: Skin is warm and dry.     Findings: No rash.  Neurological:     Mental Status: He is alert.  Psychiatric:        Mood and Affect: Mood normal.        Behavior: Behavior normal.       Results for orders placed or performed in visit on 09/05/22  Magnesium  Result Value Ref Range   Magnesium 2.0 1.5 - 2.5 mg/dL  CK  Result Value Ref Range   Total CK 296 (H) 7 - 232 U/L  CBC with Differential/Platelet  Result Value Ref Range   WBC 4.4 4.0 - 10.5 K/uL   RBC 3.68 (L) 4.22 - 5.81 Mil/uL   Hemoglobin 12.2 (L) 13.0 - 17.0 g/dL   HCT 09.8 (L) 11.9 - 14.7 %   MCV 96.1 78.0 - 100.0 fl   MCHC 34.7 30.0 - 36.0 g/dL   RDW 82.9 56.2 - 13.0 %   Platelets 232.0 150.0 - 400.0 K/uL    Neutrophils Relative % 57.8 43.0 - 77.0 %   Lymphocytes Relative 28.4 12.0 - 46.0 %   Monocytes Relative 10.7 3.0 - 12.0 %   Eosinophils Relative 2.4 0.0 - 5.0 %   Basophils Relative 0.7 0.0 -  3.0 %   Neutro Abs 2.6 1.4 - 7.7 K/uL   Lymphs Abs 1.3 0.7 - 4.0 K/uL   Monocytes Absolute 0.5 0.1 - 1.0 K/uL   Eosinophils Absolute 0.1 0.0 - 0.7 K/uL   Basophils Absolute 0.0 0.0 - 0.1 K/uL  Basic metabolic panel  Result Value Ref Range   Sodium 139 135 - 145 mEq/L   Potassium 4.2 3.5 - 5.1 mEq/L   Chloride 104 96 - 112 mEq/L   CO2 28 19 - 32 mEq/L   Glucose, Bld 106 (H) 70 - 99 mg/dL   BUN 16 6 - 23 mg/dL   Creatinine, Ser 2.95 0.40 - 1.50 mg/dL   GFR 62.13 >08.65 mL/min   Calcium 8.8 8.4 - 10.5 mg/dL  TSH  Result Value Ref Range   TSH 1.89 0.35 - 5.50 uIU/mL   Lab Results  Component Value Date   HGBA1C 5.6 01/09/2022    Assessment & Plan:   Problem List Items Addressed This Visit     GERD (gastroesophageal reflux disease)    He was taking both esomeprazole and pantoprazole - advised stop esomeprazole given plavix interaction. Continue only pantoprazole 20mg  daily.       HTN (hypertension) - Primary    Chronic, improving but remains uncontrolled. Will increase hydralazine to 50mg  TID. He continues taking toprol XL - will drop to 12.5mg  daily given bradycardia noted.       Relevant Medications   metoprolol succinate (TOPROL-XL) 25 MG 24 hr tablet   hydrALAZINE (APRESOLINE) 50 MG tablet   Other Relevant Orders   Basic metabolic panel   CK   Lipid panel   Dyslipidemia    Atorvastatin stopped due to elevated CPK - update CPK levels and lipid levels.  Previously started 08/2022 due to h/o carotid disease and ischemic stroke       MCI (mild cognitive impairment)    Continues low dose aricept. He notes ongoing memory difficulty. Consider updated MMSA, consider namenda.  Hesitant to increase aricept dose given possible altered cardiac conduction      AV block, 1st degree     He has continued Toprol XL 25mg  daily. Will drop to 12.5mg  daily.       Relevant Medications   metoprolol succinate (TOPROL-XL) 25 MG 24 hr tablet   hydrALAZINE (APRESOLINE) 50 MG tablet   Elevated CK    Update CPK off statin        Meds ordered this encounter  Medications   Magnesium 500 MG CAPS    Sig: Take 1 capsule (500 mg total) by mouth daily.   cyanocobalamin (VITAMIN B12) 1000 MCG tablet    Sig: Take 1 tablet (1,000 mcg total) by mouth daily.   Potassium Gluconate 550 (90 K) MG TABS    Sig: Take 1 tablet by mouth daily.   metoprolol succinate (TOPROL-XL) 25 MG 24 hr tablet    Sig: Take 0.5 tablets (12.5 mg total) by mouth daily.    Dispense:  45 tablet    Refill:  3    Note new dose   hydrALAZINE (APRESOLINE) 50 MG tablet    Sig: Take 1 tablet (50 mg total) by mouth 3 (three) times daily.    Dispense:  270 tablet    Refill:  1    Note new dose    Orders Placed This Encounter  Procedures   Basic metabolic panel   CK   Lipid panel    Patient Instructions  Take tylenol (Acetaminophen) in place of  ibuprofen for body pain/joint pain because ibuprofen can interact with plavix.  Cut metoprolol to 1/2 tablet daily.  Increase hydralazine to three times a day.  Stop esomeprazole, continue only pantoprazole for reflux/heartburn.  Good to see you today Return in 3 months for physical/wellness visit   Follow up plan: Return in about 3 months (around 03/07/2023) for annual exam, prior fasting for blood work, medicare wellness visit.  Eustaquio Boyden, MD

## 2022-12-05 NOTE — Assessment & Plan Note (Signed)
Chronic, improving but remains uncontrolled. Will increase hydralazine to 50mg  TID. He continues taking toprol XL - will drop to 12.5mg  daily given bradycardia noted.

## 2022-12-05 NOTE — Assessment & Plan Note (Addendum)
He was taking both esomeprazole and pantoprazole - advised stop esomeprazole given plavix interaction. Continue only pantoprazole 20mg  daily.

## 2022-12-06 ENCOUNTER — Other Ambulatory Visit: Payer: Self-pay | Admitting: Family Medicine

## 2022-12-06 NOTE — Telephone Encounter (Signed)
Plavix Last filled:  09/22/22, #90 Last OV:  12/05/22, 3 mo HTN f/u Next OV:  04/03/23, CPE

## 2022-12-10 ENCOUNTER — Emergency Department: Payer: Medicare Other

## 2022-12-10 ENCOUNTER — Other Ambulatory Visit: Payer: Self-pay

## 2022-12-10 ENCOUNTER — Encounter: Payer: Self-pay | Admitting: Emergency Medicine

## 2022-12-10 ENCOUNTER — Emergency Department
Admission: EM | Admit: 2022-12-10 | Discharge: 2022-12-10 | Disposition: A | Discharge status: Home / Self Care | Payer: Medicare Other | Attending: Student in an Organized Health Care Education/Training Program | Admitting: Student in an Organized Health Care Education/Training Program

## 2022-12-10 DIAGNOSIS — K429 Umbilical hernia without obstruction or gangrene: Secondary | ICD-10-CM | POA: Diagnosis not present

## 2022-12-10 DIAGNOSIS — I440 Atrioventricular block, first degree: Secondary | ICD-10-CM | POA: Diagnosis not present

## 2022-12-10 DIAGNOSIS — J9811 Atelectasis: Secondary | ICD-10-CM | POA: Insufficient documentation

## 2022-12-10 DIAGNOSIS — R110 Nausea: Secondary | ICD-10-CM | POA: Insufficient documentation

## 2022-12-10 DIAGNOSIS — R079 Chest pain, unspecified: Secondary | ICD-10-CM | POA: Insufficient documentation

## 2022-12-10 DIAGNOSIS — K828 Other specified diseases of gallbladder: Secondary | ICD-10-CM | POA: Diagnosis not present

## 2022-12-10 DIAGNOSIS — N281 Cyst of kidney, acquired: Secondary | ICD-10-CM | POA: Diagnosis not present

## 2022-12-10 DIAGNOSIS — I959 Hypotension, unspecified: Secondary | ICD-10-CM | POA: Diagnosis not present

## 2022-12-10 DIAGNOSIS — R1011 Right upper quadrant pain: Secondary | ICD-10-CM | POA: Insufficient documentation

## 2022-12-10 DIAGNOSIS — I700 Atherosclerosis of aorta: Secondary | ICD-10-CM | POA: Insufficient documentation

## 2022-12-10 DIAGNOSIS — S2242XA Multiple fractures of ribs, left side, initial encounter for closed fracture: Secondary | ICD-10-CM | POA: Diagnosis not present

## 2022-12-10 DIAGNOSIS — R001 Bradycardia, unspecified: Secondary | ICD-10-CM | POA: Diagnosis not present

## 2022-12-10 DIAGNOSIS — K5730 Diverticulosis of large intestine without perforation or abscess without bleeding: Secondary | ICD-10-CM | POA: Diagnosis not present

## 2022-12-10 DIAGNOSIS — R1013 Epigastric pain: Secondary | ICD-10-CM | POA: Diagnosis not present

## 2022-12-10 DIAGNOSIS — Z1152 Encounter for screening for COVID-19: Secondary | ICD-10-CM | POA: Diagnosis not present

## 2022-12-10 DIAGNOSIS — R0789 Other chest pain: Secondary | ICD-10-CM | POA: Diagnosis not present

## 2022-12-10 DIAGNOSIS — I10 Essential (primary) hypertension: Secondary | ICD-10-CM | POA: Diagnosis not present

## 2022-12-10 LAB — BASIC METABOLIC PANEL
Anion gap: 10 (ref 5–15)
BUN: 19 mg/dL (ref 8–23)
CO2: 23 mmol/L (ref 22–32)
Calcium: 8.3 mg/dL — ABNORMAL LOW (ref 8.9–10.3)
Chloride: 105 mmol/L (ref 98–111)
Creatinine, Ser: 1.07 mg/dL (ref 0.61–1.24)
GFR, Estimated: >60 mL/min (ref >60)
Glucose, Bld: 107 mg/dL — ABNORMAL HIGH (ref 70–99)
Potassium: 3.7 mmol/L (ref 3.5–5.1)
Sodium: 138 mmol/L (ref 135–145)

## 2022-12-10 LAB — CBC
HCT: 34 % — ABNORMAL LOW (ref 39.0–52.0)
Hemoglobin: 11.3 g/dL — ABNORMAL LOW (ref 13.0–17.0)
MCH: 32.2 pg (ref 26.0–34.0)
MCHC: 33.2 g/dL (ref 30.0–36.0)
MCV: 96.9 fL (ref 80.0–100.0)
Platelets: 206 10*3/uL (ref 150–400)
RBC: 3.51 MIL/uL — ABNORMAL LOW (ref 4.22–5.81)
RDW: 14.1 % (ref 11.5–15.5)
WBC: 4.6 10*3/uL (ref 4.0–10.5)
nRBC: 0 % (ref 0.0–0.2)

## 2022-12-10 LAB — HEPATIC FUNCTION PANEL
ALT: 31 U/L (ref 0–44)
AST: 58 U/L — ABNORMAL HIGH (ref 15–41)
Albumin: 3.6 g/dL (ref 3.5–5.0)
Alkaline Phosphatase: 48 U/L (ref 38–126)
Bilirubin, Direct: 0.3 mg/dL — ABNORMAL HIGH (ref 0.0–0.2)
Indirect Bilirubin: 0.5 mg/dL (ref 0.3–0.9)
Total Bilirubin: 0.8 mg/dL (ref 0.3–1.2)
Total Protein: 6.3 g/dL — ABNORMAL LOW (ref 6.5–8.1)

## 2022-12-10 LAB — LIPASE, BLOOD: Lipase: 43 U/L (ref 11–51)

## 2022-12-10 LAB — TROPONIN I (HIGH SENSITIVITY)
Troponin I (High Sensitivity): 6 ng/L (ref <18)
Troponin I (High Sensitivity): 7 ng/L (ref <18)

## 2022-12-10 LAB — SARS CORONAVIRUS 2 BY RT PCR: SARS Coronavirus 2 by RT PCR: NEGATIVE

## 2022-12-10 MED ORDER — IOHEXOL 350 MG/ML SOLN
100.0000 mL | Freq: Once | INTRAVENOUS | Status: AC | PRN
  Administered 2022-12-10: 85 mL via INTRAVENOUS

## 2022-12-10 NOTE — ED Provider Notes (Signed)
[facility] Provider Note    Event Date/Time   First MD Initiated Contact with Patient 12/10/22 1213     (approximate)   History   Chest Pain   HPI  [name] is a 89 y.o. male who presents to the ER for evaluation of chest pain and anterior in nature no radiation through to the back.  Did have some associated nausea and some right upper quadrant pain.  Is never had pain like this before.  No history of CAD or lung issues.  Does not feel short of breath.  EMS was called and was given aspirin and nitro with improvement in symptoms.     Physical Exam   Triage Vital Signs: ED Triage Vitals  Encounter Vitals Group     BP 12/10/22 1147 127/86     Systolic BP Percentile --      Diastolic BP Percentile --      Pulse Rate 12/10/22 1147 61     Resp 12/10/22 1147 17     Temp 12/10/22 1147 97.9 F (36.6 C)     Temp src --      SpO2 12/10/22 1147 94 %     Weight 12/10/22 1145 215 lb (97.5 kg)     Height 12/10/22 1145 5\' 9"  (1.753 m)     Head Circumference --      Peak Flow --      Pain Score 12/10/22 1144 2     Pain Loc --      Pain Education --      Exclude from Growth Chart --     Most recent vital signs: Vitals:   12/10/22 1430 12/10/22 1500  BP: (!) 133/57 (!) 151/68  Pulse: (!) 56 (!) 58  Resp: 15 11  Temp:    SpO2: 100% 100%     Constitutional: Alert  Eyes: Conjunctivae are normal.  Head: Atraumatic. Nose: No congestion/rhinnorhea. Mouth/Throat: Mucous membranes are moist.   Neck: Painless ROM.  Cardiovascular:   Good peripheral circulation. Respiratory: Normal respiratory effort.  No retractions.  Gastrointestinal: Soft and nontender.  Musculoskeletal:  no deformity Neurologic:  MAE spontaneously. No gross focal neurologic deficits are appreciated.  Skin:  Skin is warm, dry and intact. No rash noted. Psychiatric: Mood and affect are normal. Speech and behavior are normal.    ED Results / Procedures / Treatments    Labs (all labs ordered are listed, but only abnormal results are displayed) Labs Reviewed  BASIC METABOLIC PANEL - Abnormal; Notable for the following components:      Result Value   Glucose, Bld 107 (*)    Calcium 8.3 (*)    All other components within normal limits  CBC - Abnormal; Notable for the following components:   RBC 3.51 (*)    Hemoglobin 11.3 (*)    HCT 34.0 (*)    All other components within normal limits  HEPATIC FUNCTION PANEL - Abnormal; Notable for the following components:   Total Protein 6.3 (*)    AST 58 (*)    Bilirubin, Direct 0.3 (*)    All other components within normal limits  SARS CORONAVIRUS 2 BY RT PCR  LIPASE, BLOOD  TROPONIN I (HIGH SENSITIVITY)  TROPONIN I (HIGH SENSITIVITY)     EKG  ED ECG REPORT I, Willy Eddy, the attending physician, personally viewed and interpreted this ECG.   Date: 12/10/2022  EKG Time: 11:46  Rate: 60  Rhythm: sinus with 1st degree av block  Axis:   Intervals: normal qt  ST&T Change: no stemi, no depressions    RADIOLOGY Please see ED Course for my review and interpretation.  I personally reviewed all radiographic images ordered to evaluate for the above acute complaints and reviewed radiology reports and findings.  These findings were personally discussed with the patient.  Please see medical record for radiology report.    PROCEDURES:  Critical Care performed: No  Procedures   MEDICATIONS ORDERED IN ED: Medications  iohexol (OMNIPAQUE) 350 MG/ML injection 100 mL (has no administration in time range)     IMPRESSION / MDM / ASSESSMENT AND PLAN / ED COURSE  I reviewed the triage vital signs and the nursing notes.                              Differential diagnosis includes, but is not limited to, ACS, pericarditis, esophagitis, boerhaaves, pe, dissection, pna, bronchitis, costochondritis, biliary pathology, colitis, pancreatitis  Patient presenting to the ER for evaluation of symptoms as  described above.  Based on symptoms, risk factors and considered above differential, this presenting complaint could reflect a potentially life-threatening illness therefore the patient will be placed on continuous pulse oximetry and telemetry for monitoring.  Laboratory evaluation will be sent to evaluate for the above complaints.  Patient with significant proved in pain.  EKG without ischemic changes.  Initial troponin negative.  Has some right upper quadrant tenderness on exam.  Will order right upper quadrant ultrasound.   Clinical Course as of 12/10/22 1534  Sun Dec 10, 2022  1523 Patient reassessed.  Still having some discomfort on abdominal exam.  Given his hypertension and age risk factors will order CTA.  Patient be signed out to oncoming physician.  Assuming CTA without evidence of acute pathology do believe he will be appropriate for outpatient follow-up.  Patient is agreeable to plan. [PR]    Clinical Course User Index [PR] Willy Eddy, MD     FINAL CLINICAL IMPRESSION(S) / ED DIAGNOSES   Final diagnoses:  Nonspecific chest pain     Rx / DC Orders   ED Discharge Orders          Ordered    Ambulatory referral to Cardiology       Comments: If you have not heard from the Cardiology office within the next 72 hours please call [phone].   12/10/22 1534             Note:  This document was prepared using Dragon voice recognition software and may include unintentional dictation errors.    Willy Eddy, MD 12/10/22 1534

## 2022-12-10 NOTE — ED Notes (Signed)
ED Provider at bedside. 

## 2022-12-10 NOTE — ED Triage Notes (Signed)
Pt BIB [facility] from home. Pt coming for chest pain with no previous cardiac events. ASA (4x81mg ) given and nitroglycerin (1 spray) given prior to patient arrival. Pt reports a headache and some dizziness.   EMS vitals 171/98 60 HR

## 2022-12-11 ENCOUNTER — Telehealth: Payer: Self-pay

## 2022-12-11 ENCOUNTER — Encounter: Payer: Self-pay | Admitting: Family Medicine

## 2022-12-11 ENCOUNTER — Other Ambulatory Visit: Payer: Self-pay | Admitting: Family Medicine

## 2022-12-11 DIAGNOSIS — M955 Acquired deformity of pelvis: Secondary | ICD-10-CM | POA: Diagnosis not present

## 2022-12-11 DIAGNOSIS — M9905 Segmental and somatic dysfunction of pelvic region: Secondary | ICD-10-CM | POA: Diagnosis not present

## 2022-12-11 DIAGNOSIS — M9903 Segmental and somatic dysfunction of lumbar region: Secondary | ICD-10-CM | POA: Diagnosis not present

## 2022-12-11 DIAGNOSIS — M5416 Radiculopathy, lumbar region: Secondary | ICD-10-CM | POA: Diagnosis not present

## 2022-12-11 MED ORDER — PRAVASTATIN SODIUM 20 MG PO TABS: 20.0000 mg | ORAL_TABLET | ORAL | 1 refills | Status: DC

## 2022-12-11 NOTE — Transitions of Care (Post Inpatient/ED Visit) (Signed)
   12/11/2022  Name: [name] MRN: [id number] DOB: 1932/01/28  Today's TOC FU Call Status: Today's TOC FU Call Status:: Unsuccessful Call (1st Attempt) Unsuccessful Call (1st Attempt) Date: 12/11/22  Attempted to reach the patient regarding the most recent Inpatient/ED visit.  Follow Up Plan: Additional outreach attempts will be made to reach the patient to complete the Transitions of Care (Post Inpatient/ED visit) call.   Signature Elisha Ponder LPN [facility] AWV Team Direct dial:  [phone]

## 2022-12-12 ENCOUNTER — Encounter: Payer: Self-pay | Admitting: *Deleted

## 2022-12-12 ENCOUNTER — Telehealth: Payer: Self-pay | Admitting: Family Medicine

## 2022-12-12 ENCOUNTER — Encounter: Payer: Self-pay | Admitting: Family Medicine

## 2022-12-12 NOTE — Telephone Encounter (Signed)
Pt states he was told by his pharmacy that he was prescribed "atorvastatin" back on 09/05/22. Pt states he doesn't remember ever being on those meds. Pt states he was prescribed pravastatin (PRAVACHOL) 20 MG tablet by Dr. Reece Agar on yesterday & wants to make sure he's taking the correct meds? Call back # [phone]

## 2022-12-12 NOTE — Telephone Encounter (Signed)
Spoke with pt earlier concerning this issue (see 12/12/22 phn note). Plz advise pt.

## 2022-12-12 NOTE — Telephone Encounter (Signed)
Spoke with pt explaining Dr. Reece Agar stopped atorvastatin 10 mg (see 12/05/22 OV notes). Looks like Dr. Reece Agar prescribed pravastatin 20 mg, 1 tab 2 times a wk and sent rx yesterday. Pt verbalizes understanding and expresses his thanks for clearing things up for him.

## 2022-12-13 ENCOUNTER — Telehealth: Payer: Self-pay

## 2022-12-13 NOTE — Transitions of Care (Post Inpatient/ED Visit) (Signed)
   12/13/2022  Name: RAMIL FOIL MRN: [id number] DOB: [birth year]  Today's TOC FU Call Status: Today's TOC FU Call Status:: Successful TOC FU Call Completed TOC FU Call Complete Date: 12/13/22  Transition Care Management Follow-up Telephone Call Date of Discharge: 12/10/22 Discharge Facility: [facility]) Type of Discharge: Emergency Department How have you been since you were released from the hospital?: Better Any questions or concerns?: No  Items Reviewed: Did you receive and understand the discharge instructions provided?: Yes Medications obtained,verified, and reconciled?: Yes (Medications Reviewed) Any new allergies since your discharge?: No Dietary orders reviewed?: NA Do you have support at home?: Yes People in Home: spouse  Medications Reviewed Today: Medications Reviewed Today   Medications were not reviewed in this encounter     Home Care and Equipment/Supplies: Were Home Health Services Ordered?: No Any new equipment or medical supplies ordered?: No  Functional Questionnaire: Do you need assistance with bathing/showering or dressing?: No Do you need assistance with meal preparation?: No Do you need assistance with eating?: No Do you have difficulty maintaining continence: No Do you need assistance with getting out of bed/getting out of a chair/moving?: No Do you have difficulty managing or taking your medications?: No  Follow up appointments reviewed: PCP Follow-up appointment confirmed?: Yes Date of PCP follow-up appointment?: 12/25/22 Specialist Hospital Follow-up appointment confirmed?: NA Do you need transportation to your follow-up appointment?: No Do you understand care options if your condition(s) worsen?: Yes-patient verbalized understanding    SIGNATURE TB,CMA

## 2022-12-25 ENCOUNTER — Ambulatory Visit (INDEPENDENT_AMBULATORY_CARE_PROVIDER_SITE_OTHER): Payer: Medicare Other | Admitting: Family Medicine

## 2022-12-25 ENCOUNTER — Encounter: Payer: Self-pay | Admitting: Family Medicine

## 2022-12-25 VITALS — BP 158/60 | HR 56 | Temp 97.6°F | Ht 69.0 in | Wt 218.6 lb

## 2022-12-25 DIAGNOSIS — M5136 Other intervertebral disc degeneration, lumbar region: Secondary | ICD-10-CM

## 2022-12-25 DIAGNOSIS — K219 Gastro-esophageal reflux disease without esophagitis: Secondary | ICD-10-CM | POA: Diagnosis not present

## 2022-12-25 DIAGNOSIS — M1A00X0 Idiopathic chronic gout, unspecified site, without tophus (tophi): Secondary | ICD-10-CM

## 2022-12-25 DIAGNOSIS — I10 Essential (primary) hypertension: Secondary | ICD-10-CM

## 2022-12-25 DIAGNOSIS — G4733 Obstructive sleep apnea (adult) (pediatric): Secondary | ICD-10-CM

## 2022-12-25 DIAGNOSIS — I440 Atrioventricular block, first degree: Secondary | ICD-10-CM

## 2022-12-25 DIAGNOSIS — Z8673 Personal history of transient ischemic attack (TIA), and cerebral infarction without residual deficits: Secondary | ICD-10-CM

## 2022-12-25 DIAGNOSIS — M51369 Other intervertebral disc degeneration, lumbar region without mention of lumbar back pain or lower extremity pain: Secondary | ICD-10-CM

## 2022-12-25 DIAGNOSIS — R1011 Right upper quadrant pain: Secondary | ICD-10-CM

## 2022-12-25 DIAGNOSIS — N281 Cyst of kidney, acquired: Secondary | ICD-10-CM

## 2022-12-25 DIAGNOSIS — E785 Hyperlipidemia, unspecified: Secondary | ICD-10-CM

## 2022-12-25 MED ORDER — AMLODIPINE BESYLATE 10 MG PO TABS
10.0000 mg | ORAL_TABLET | Freq: Every day | ORAL | 2 refills | Status: DC
Start: 2022-12-25 — End: 2023-04-13

## 2022-12-25 MED ORDER — LOSARTAN POTASSIUM 25 MG PO TABS: 25.0000 mg | ORAL_TABLET | Freq: Every day | ORAL | 1 refills | Status: DC

## 2022-12-25 NOTE — Assessment & Plan Note (Addendum)
Now on pravastatin 20mg  twice weekly although he thinks he's been taking it daily. Atorvastatin elevated CPK levels.

## 2022-12-25 NOTE — Assessment & Plan Note (Signed)
US showed gallstones vs biliary sludge however CT without obvious gallstones.  Pt remains asxs from abdominal standpoint - will continue to monitor.

## 2022-12-25 NOTE — Addendum Note (Signed)
Addended by: Eustaquio Boyden on: 12/25/2022 01:38 PM   Modules accepted: Level of Service

## 2022-12-25 NOTE — Assessment & Plan Note (Signed)
Chronic, BP elevated.  Will stop metoprolol succinate (Toprol XL) in h/o bradycardia.  Will start losartan 25mg  daily - sent to pharmacy.  Will stop potassium. Continue hydralazine 50mg  TID and amlodipine 10mg  daily.  Continue home BP log, reassess control at f/u visit.

## 2022-12-25 NOTE — Assessment & Plan Note (Addendum)
Encouraged he call neurology to schedule OSA f/u visit.

## 2022-12-25 NOTE — Assessment & Plan Note (Signed)
He notes he stopped allopurinol earlier in the year, remains gout free.  Update urate next labwork, stay off daily gout lowering medication at this time

## 2022-12-25 NOTE — Assessment & Plan Note (Signed)
Stay off metoprolol succinate. Caution with aricept use.

## 2022-12-25 NOTE — Assessment & Plan Note (Signed)
4.5cm L kidney incidentally noted on imaging- no f/u recommended

## 2022-12-25 NOTE — Patient Instructions (Addendum)
Drop pravastatin cholesterol medicine to twice weekly, dose at night time.  Stop metoprolol ER, stop potassium. Start in its place losartan 25mg  daily.  Monitor blood pressures at home. Goal <150/90.  Continue plavix daily.  Ok to stay off allopurinol - we will check gout levels next labwork

## 2022-12-25 NOTE — Assessment & Plan Note (Signed)
Stable period on pantoprazole 20mg  daily without significant heartburn symptoms.  Now off esomeprazole (given plavix interaction).

## 2022-12-25 NOTE — Progress Notes (Signed)
Ph: [phone] Fax: [phone]   Patient ID: [name], male    DOB: Jun 07, 1931, 89 y.o.   MRN: [id number]  This visit was conducted in person.  BP (!) 158/60 (BP Location: Left Arm, Cuff Size: Normal)   Pulse (!) 56   Temp 97.6 F (36.4 C) (Temporal)   Ht 5\' 9"  (1.753 m)   Wt 218 lb 9.6 oz (99.2 kg)   SpO2 98%   BMI 32.28 kg/m    CC: ER f/u visit  Subjective:   HPI: [name] is a 89 y.o. male presenting on 12/25/2022 for Hospital follow up    Recent ER visit 12/10/2022 for anterior chest pain described as sharp pain associated with nausea and RUQ abd discomfort. Treated with aspirin, nitroglycerin with benefit. No dyspnea.  This started after breakfast with coffee.  Hospital records reviewed. Med rec performed.  Overall reassuring evaluation including TnI x2, CXR, EKG.  RUQ US showed mobile layering echogenic material within gallbladder - sludge vs non-shadowing gallstones.  CT angio chest/abd/pelvis negative for acute aortic issue, enlarged main pulm artery suggestive of pulm HTN, aortic ATH. No gallstones noted on this imaging study. There was incidentally noted L 4.5cm renal cyst - benign appearing with no f/u needed. Severe DDD changes to lumbar spine.   HTN - continues amlodipine 10mg  daily, hydralazine 50mg  TID. Continues Toprol XL 12.5mg  daily - caution in bradycardia hx. Notes occ ankle swelling with amlodipine. Does check BP at home - 120-150s/50-60s.   GERD - he continues pantoprazole 20mg  daily.   S/p stroke 01/2022 presenting with imbalance/dizziness. Brain imaging at that time showed ischemic nonhemorrhagic infarct of central cerebellum, treated with DAPT x 3 mo then plavix alone. Aspirin stopped 04/2022. Found to have RICA 70%, LICA 50% stenosis, as well as R subclavian stenosis 85% at origin and occluded L vertebral artery - thought noncontributory. Saw cardiology and neurology - f/u planned PRN.   Atorvastatin 10mg  may have caused CPK  elevation. Now on pravastatin 20mg  twice weekly. He has been taking pravastatin daily.   He stopped allopurinol earlier this year.   Home health not set up.  Other follow up appointments scheduled: cardiology Joni Reining NP 01/02/2023.  OSA on CPAP - overdue for f/u with neurology [facility] ______________________________________________________________________ TCM f/u phone call:  performed on 12/13/2022      Relevant past medical, surgical, family and social history reviewed and updated as indicated. Interim medical history since our last visit reviewed. Allergies and medications reviewed and updated. Outpatient Medications Prior to Visit  Medication Sig Dispense Refill   acetaminophen (TYLENOL) 500 MG tablet Take 1-2 tablets (500-1,000 mg total) by mouth 2 (two) times daily as needed for moderate pain. (Patient taking differently: Take 500-1,000 mg by mouth 2 (two) times daily as needed for moderate pain. Pt states taking 3 tablets)     azelastine (ASTELIN) 0.1 % nasal spray Place 1-2 sprays into both nostrils 2 (two) times daily. Use in each nostril as directed 30 mL 6   carboxymethylcellulose (REFRESH TEARS) 0.5 % SOLN Place 1 drop into both eyes daily as needed (dry eyes).     cetirizine (ZYRTEC) 10 MG tablet Take 10 mg by mouth daily. Alternates weekly with allegra.     clopidogrel (PLAVIX) 75 MG tablet TAKE 1 TABLET(75 MG) BY MOUTH DAILY 90 tablet 1   Coenzyme Q10 100 MG capsule Take 1 capsule (100 mg total) by mouth daily.     cyanocobalamin (VITAMIN B12) 1000 MCG tablet Take  1 tablet (1,000 mcg total) by mouth daily.     donepezil (ARICEPT) 5 MG tablet TAKE 1 TABLET(5 MG) BY MOUTH AT BEDTIME 30 tablet 8   fexofenadine (ALLEGRA) 180 MG tablet Take 180 mg by mouth daily. Alternates weekly with zyrtec.     GLUCOSAMINE-CHONDROITIN PO Take 2 tablets by mouth daily.     hydrALAZINE (APRESOLINE) 50 MG tablet Take 1 tablet (50 mg total) by mouth 3 (three) times daily. 270 tablet 1   Magnesium  500 MG CAPS Take 1 capsule (500 mg total) by mouth daily.     Multiple Vitamins-Minerals (MULTIVITAMIN ADULT PO) Take 1 tablet by mouth daily.     pantoprazole (PROTONIX) 20 MG tablet Take 1 tablet (20 mg total) by mouth daily. 90 tablet 1   polyethylene glycol (MIRALAX / GLYCOLAX) packet Take 8.5 g by mouth daily.      pravastatin (PRAVACHOL) 20 MG tablet Take 1 tablet (20 mg total) by mouth 2 (two) times a week. 24 tablet 1   amLODipine (NORVASC) 10 MG tablet TAKE 1 TABLET(10 MG) BY MOUTH DAILY 90 tablet 1   metoprolol succinate (TOPROL-XL) 25 MG 24 hr tablet Take 0.5 tablets (12.5 mg total) by mouth daily. 45 tablet 3   Potassium Gluconate 550 (90 K) MG TABS Take 1 tablet by mouth daily.     allopurinol (ZYLOPRIM) 100 MG tablet TAKE 2 TABLETS(200 MG) BY MOUTH DAILY 180 tablet 3   aspirin EC 81 MG tablet Take 81 mg by mouth daily.     No facility-administered medications prior to visit.     Per HPI unless specifically indicated in ROS section below Review of Systems  Objective:  BP (!) 158/60 (BP Location: Left Arm, Cuff Size: Normal)   Pulse (!) 56   Temp 97.6 F (36.4 C) (Temporal)   Ht 5\' 9"  (1.753 m)   Wt 218 lb 9.6 oz (99.2 kg)   SpO2 98%   BMI 32.28 kg/m   Wt Readings from Last 3 Encounters:  12/25/22 218 lb 9.6 oz (99.2 kg)  12/10/22 215 lb (97.5 kg)  12/05/22 214 lb 4 oz (97.2 kg)      Physical Exam Vitals and nursing note reviewed.  Constitutional:      Appearance: Normal appearance. He is not ill-appearing.  HENT:     Mouth/Throat:     Mouth: Mucous membranes are moist.     Pharynx: Oropharynx is clear. No oropharyngeal exudate or posterior oropharyngeal erythema.  Eyes:     Extraocular Movements: Extraocular movements intact.     Pupils: Pupils are equal, round, and reactive to light.  Cardiovascular:     Rate and Rhythm: Normal rate and regular rhythm.     Pulses: Normal pulses.     Heart sounds: Normal heart sounds. No murmur heard. Pulmonary:      Effort: Pulmonary effort is normal. No respiratory distress.     Breath sounds: Normal breath sounds. No wheezing, rhonchi or rales.  Musculoskeletal:     Right lower leg: Edema (tr) present.     Left lower leg: Edema (tr) present.  Skin:    General: Skin is warm and dry.     Findings: No rash.  Neurological:     Mental Status: He is alert.  Psychiatric:        Mood and Affect: Mood normal.        Behavior: Behavior normal.       Results for orders placed or performed during the hospital encounter of  12/10/22  SARS Coronavirus 2 by RT PCR (hospital order, performed in [facility] hospital lab) *cepheid single result test* Anterior Nasal Swab   Specimen: Anterior Nasal Swab  Result Value Ref Range   SARS Coronavirus 2 by RT PCR NEGATIVE NEGATIVE  Basic metabolic panel  Result Value Ref Range   Sodium 138 135 - 145 mmol/L   Potassium 3.7 3.5 - 5.1 mmol/L   Chloride 105 98 - 111 mmol/L   CO2 23 22 - 32 mmol/L   Glucose, Bld 107 (H) 70 - 99 mg/dL   BUN 19 8 - 23 mg/dL   Creatinine, Ser 0.86 0.61 - 1.24 mg/dL   Calcium 8.3 (L) 8.9 - 10.3 mg/dL   GFR, Estimated >57 >84 mL/min   Anion gap 10 5 - 15  CBC  Result Value Ref Range   WBC 4.6 4.0 - 10.5 K/uL   RBC 3.51 (L) 4.22 - 5.81 MIL/uL   Hemoglobin 11.3 (L) 13.0 - 17.0 g/dL   HCT 69.6 (L) 29.5 - 28.4 %   MCV 96.9 80.0 - 100.0 fL   MCH 32.2 26.0 - 34.0 pg   MCHC 33.2 30.0 - 36.0 g/dL   RDW 13.2 44.0 - 10.2 %   Platelets 206 150 - 400 K/uL   nRBC 0.0 0.0 - 0.2 %  Lipase, blood  Result Value Ref Range   Lipase 43 11 - 51 U/L  Hepatic function panel  Result Value Ref Range   Total Protein 6.3 (L) 6.5 - 8.1 g/dL   Albumin 3.6 3.5 - 5.0 g/dL   AST 58 (H) 15 - 41 U/L   ALT 31 0 - 44 U/L   Alkaline Phosphatase 48 38 - 126 U/L   Total Bilirubin 0.8 0.3 - 1.2 mg/dL   Bilirubin, Direct 0.3 (H) 0.0 - 0.2 mg/dL   Indirect Bilirubin 0.5 0.3 - 0.9 mg/dL  Troponin I (High Sensitivity)  Result Value Ref Range   Troponin I (High  Sensitivity) 6 <18 ng/L  Troponin I (High Sensitivity)  Result Value Ref Range   Troponin I (High Sensitivity) 7 <18 ng/L    Assessment & Plan:   Problem List Items Addressed This Visit     GERD (gastroesophageal reflux disease)    Stable period on pantoprazole 20mg  daily without significant heartburn symptoms.  Now off esomeprazole (given plavix interaction).      Gout    He notes he stopped allopurinol earlier in the year, remains gout free.  Update urate next labwork, stay off daily gout lowering medication at this time      HTN (hypertension) - Primary    Chronic, BP elevated.  Will stop metoprolol succinate (Toprol XL) in h/o bradycardia.  Will start losartan 25mg  daily - sent to pharmacy.  Will stop potassium. Continue hydralazine 50mg  TID and amlodipine 10mg  daily.  Continue home BP log, reassess control at f/u visit.       Relevant Medications   amLODipine (NORVASC) 10 MG tablet   losartan (COZAAR) 25 MG tablet   OSA on CPAP    Encouraged he call neurology to schedule OSA f/u visit.       Dyslipidemia    Now on pravastatin 20mg  twice weekly although he thinks he's been taking it daily. Atorvastatin elevated CPK levels.       AV block, 1st degree    Stay off metoprolol succinate. Caution with aricept use.       Relevant Medications   amLODipine (NORVASC) 10 MG tablet  losartan (COZAAR) 25 MG tablet   History of cerebellar stroke   Acquired renal cyst of left kidney    4.5cm L kidney incidentally noted on imaging- no f/u recommended      DDD (degenerative disc disease), lumbar   RUQ abdominal pain    US showed gallstones vs biliary sludge however CT without obvious gallstones.  Pt remains asxs from abdominal standpoint - will continue to monitor.         Meds ordered this encounter  Medications   amLODipine (NORVASC) 10 MG tablet    Sig: Take 1 tablet (10 mg total) by mouth daily.    Dispense:  90 tablet    Refill:  2   losartan (COZAAR) 25 MG  tablet    Sig: Take 1 tablet (25 mg total) by mouth daily.    Dispense:  90 tablet    Refill:  1    To replace metoprolol succinate and potassium supplement    No orders of the defined types were placed in this encounter.   Patient Instructions  Drop pravastatin cholesterol medicine to twice weekly, dose at night time.  Stop metoprolol ER, stop potassium. Start in its place losartan 25mg  daily.  Monitor blood pressures at home. Goal <150/90.  Continue plavix daily.  Ok to stay off allopurinol - we will check gout levels next labwork  Follow up plan: Return if symptoms worsen or fail to improve.  Eustaquio Boyden, MD

## 2022-12-31 NOTE — Progress Notes (Unsigned)
Cardiology Clinic Note   Patient Name: [name] Date of Encounter: 01/02/2023  Primary Care Provider:  Eustaquio Boyden, MD Primary Cardiologist:  Paul Batty, MD  Patient Profile    89 year old male with history of hypertension, bilateral carotid artery stenosis and right subclavian artery stenosis, followed by [name], CVA with occluded left vertebral at the V1 segment with distal reconstitution treated medically, bradycardia.  Last seen in the office on 02/07/2022 by [name].    Past Medical History    Past Medical History:  Diagnosis Date   Actinic keratosis    Carotid stenosis, asymptomatic, bilateral 09/16/2016   Diffuse 1-39% bilaterally, but involving ICA, ECA, CCA - rpt 1 yr (09/2016)   Complication of anesthesia    slow to wake up after a colonoscopy years ago   Diverticulitis    GERD (gastroesophageal reflux disease)    Gout    History of osteomyelitis 10/2015   left foot   History of squamous cell carcinoma of skin    HTN (hypertension)    OSA on CPAP 09/01/2016   Osteoarthritis    back, neck, knees - multiple joints   Personal history of malignant neoplasm of larynx 2009   s/p XRT, Skin Cancer- "pre cancerous"   Pre-diabetes    Seasonal allergies    Sensorineural hearing loss (SNHL) of both ears 09/01/2016   Squamous cell carcinoma of skin unknown   Treated in [place] - SCC of the L post auricular    Squamous cell carcinoma of skin 10/26/2022   R med knee, [facility]   Past Surgical History:  Procedure Laterality Date   ACHILLES TENDON REPAIR Left    AMPUTATION Left 05/16/2019   LEFT GREAT TOE AMPUTATION for osteomyelitis Lajoyce Corners, Randa Evens, MD), 2nd toe removed   APPENDECTOMY     COLONOSCOPY     THROAT SURGERY N/A 2009   ?vocal cord vs larynx nodule removal, had XRT after this but states it was not cancer   TONSILLECTOMY     TREATMENT FISTULA ANAL      Allergies  Allergies  Allergen Reactions   Lisinopril Cough   Atorvastatin Other (See  Comments)    Myalgias and mildly elevated CPK     History of Present Illness    [name] returns today for ongoing assessment and management of hypertension, dyslipidemia, CVA, bradycardia, who was seen recently by primary care on 12/25/2022 at which time metoprolol succinate was stopped in the setting of bradycardia, losartan 25 mg daily was ordered, potassium was discontinued, he was to continue hydralazine 50 mg 3 times daily and amlodipine 10 mg daily.  He was to keep a home blood pressure log.  He comes today after being seen in the ED on 12/10/2022 for complaints of chest pain.  The patient had helped with changing out smoke detectors and had been keeping his arms above his head and handing tools to a person who was installing the smoke detectors.  The following day the patient began to have some discomfort moving across his chest from left to right, and also bilateral arm pain.  He had no associated dyspnea diaphoresis nausea or vomiting.  As a result of this the patient was brought to the emergency room via EMS.  He was given sublingual nitroglycerin which relieved pain and route.  In ED he was ruled out for ACS by troponin and EKG.  He was sent home and was to follow-up with cardiology.  Since that time the patient has been  without any recurrence of discomfort.  It was felt to be musculoskeletal pain.  The patient has been walking for the last 8 days 20 to 30 minutes on flat ground.  He is feeling better each time he walks and offers no complaints of chest discomfort with exertion or shortness of breath.   Home Medications    Current Outpatient Medications  Medication Sig Dispense Refill   acetaminophen (TYLENOL) 500 MG tablet Take 1-2 tablets (500-1,000 mg total) by mouth 2 (two) times daily as needed for moderate pain. (Patient taking differently: Take 500-1,000 mg by mouth 2 (two) times daily as needed for moderate pain. Pt states taking 3 tablets)     amLODipine (NORVASC) 10 MG tablet  Take 1 tablet (10 mg total) by mouth daily. 90 tablet 2   azelastine (ASTELIN) 0.1 % nasal spray Place 1-2 sprays into both nostrils 2 (two) times daily. Use in each nostril as directed 30 mL 6   carboxymethylcellulose (REFRESH TEARS) 0.5 % SOLN Place 1 drop into both eyes daily as needed (dry eyes).     cetirizine (ZYRTEC) 10 MG tablet Take 10 mg by mouth daily. Alternates weekly with allegra.     clopidogrel (PLAVIX) 75 MG tablet TAKE 1 TABLET(75 MG) BY MOUTH DAILY 90 tablet 1   Coenzyme Q10 100 MG capsule Take 1 capsule (100 mg total) by mouth daily.     cyanocobalamin (VITAMIN B12) 1000 MCG tablet Take 1 tablet (1,000 mcg total) by mouth daily.     donepezil (ARICEPT) 5 MG tablet TAKE 1 TABLET(5 MG) BY MOUTH AT BEDTIME 30 tablet 8   fexofenadine (ALLEGRA) 180 MG tablet Take 180 mg by mouth daily. Alternates weekly with zyrtec.     GLUCOSAMINE-CHONDROITIN PO Take 2 tablets by mouth daily.     hydrALAZINE (APRESOLINE) 50 MG tablet Take 1 tablet (50 mg total) by mouth 3 (three) times daily. 270 tablet 1   losartan (COZAAR) 25 MG tablet Take 1 tablet (25 mg total) by mouth daily. 90 tablet 1   Magnesium 500 MG CAPS Take 1 capsule (500 mg total) by mouth daily.     Multiple Vitamins-Minerals (MULTIVITAMIN ADULT PO) Take 1 tablet by mouth daily.     pantoprazole (PROTONIX) 20 MG tablet Take 1 tablet (20 mg total) by mouth daily. 90 tablet 1   polyethylene glycol (MIRALAX / GLYCOLAX) packet Take 8.5 g by mouth daily.      pravastatin (PRAVACHOL) 20 MG tablet Take 1 tablet (20 mg total) by mouth 2 (two) times a week. 24 tablet 1   No current facility-administered medications for this visit.     Family History    Family History  Problem Relation Age of Onset   Hypertension Mother    Hypertension Father    Cancer Other        unknown - niece   CAD Sister 25       stent (80% blockage)   CAD Brother 14       stent (100% blockage)   He indicated that the status of his mother is unknown. He  indicated that the status of his father is unknown. He indicated that the status of his sister is unknown. He indicated that the status of his brother is unknown. He indicated that the status of his other is unknown.  Social History    Social History   Socioeconomic History   Marital status: Married    Spouse name: Not on file   Number of children: Not  on file   Years of education: Not on file   Highest education level: Bachelor's degree (e.g., BA, AB, BS)  Occupational History   Not on file  Tobacco Use   Smoking status: Never   Smokeless tobacco: Never  Vaping Use   Vaping status: Never Used  Substance and Sexual Activity   Alcohol use: Yes    Comment: occasional wine   Drug use: No   Sexual activity: Not on file  Other Topics Concern   Not on file  Social History Narrative   "[name]"   Widower. 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   Edu: college chemistry degree   Right handed   Activity: walking 9/10th mile daily    Caffeine: Drinks 1 cup of coffee every morning    Social Determinants of Health   Financial Resource Strain: Low Risk  (02/22/2022)   Overall Financial Resource Strain (CARDIA)    Difficulty of Paying Living Expenses: Not hard at all  Food Insecurity: No Food Insecurity (02/22/2022)   Hunger Vital Sign    Worried About Running Out of Food in the Last Year: Never true    Ran Out of Food in the Last Year: Never true  Transportation Needs: No Transportation Needs (02/22/2022)   PRAPARE - Administrator, Civil Service (Medical): No    Lack of Transportation (Non-Medical): No  Physical Activity: Sufficiently Active (02/22/2022)   Exercise Vital Sign    Days of Exercise per Week: 7 days    Minutes of Exercise per Session: 30 min  Stress: No Stress Concern Present (02/22/2022)   [name] of Occupational Health - Occupational Stress Questionnaire    Feeling of Stress : Not at all   Social Connections: Unknown (02/22/2022)   Social Connection and Isolation Panel [NHANES]    Frequency of Communication with Friends and Family: Not on file    Frequency of Social Gatherings with Friends and Family: Not on file    Attends Religious Services: Not on file    Active Member of Clubs or Organizations: Not on file    Attends Banker Meetings: Not on file    Marital Status: Married  Intimate Partner Violence: Not At Risk (02/22/2022)   Humiliation, Afraid, Rape, and Kick questionnaire    Fear of Current or Ex-Partner: No    Emotionally Abused: No    Physically Abused: No    Sexually Abused: No     Review of Systems    General:  No chills, fever, night sweats or weight changes.  Cardiovascular:  No chest pain, dyspnea on exertion, edema, orthopnea, palpitations, paroxysmal nocturnal dyspnea. Dermatological: No rash, lesions/masses Respiratory: No cough, dyspnea Urologic: No hematuria, dysuria Abdominal:   No nausea, vomiting, diarrhea, bright red blood per rectum, melena, or hematemesis Neurologic:  No visual changes, wkns, changes in mental status. All other systems reviewed and are otherwise negative except as noted above.  EKG Interpretation Date/Time:  Tuesday January 02 2023 15:41:45 EDT Ventricular Rate:  70 PR Interval:  222 QRS Duration:  98 QT Interval:  428 QTC Calculation: 462 R Axis:   22  Text Interpretation: Sinus rhythm with 1st degree A-V block When compared with ECG of 02-Jan-2023 15:40, No significant change was found Confirmed by Joni Reining [phone]) on 01/02/2023 5:13:11 PM    Physical Exam    VS:  BP 132/84   Pulse 70   Ht 5\' 9"  (1.753 m)  Wt 219 lb 6.4 oz (99.5 kg)   SpO2 95%   BMI 32.40 kg/m  , BMI Body mass index is 32.4 kg/m.     GEN: Well nourished, well developed, in no acute distress. HEENT: normal. Neck: Supple, no JVD, right carotid bruits, none on the left, no masses. Cardiac: RRR, soft systolic murmurs,  rubs, or gallops. No clubbing, cyanosis, 1+ dependent edema.  Radials/DP/PT 2+ and equal bilaterally.  Respiratory:  Respirations regular and unlabored, clear to auscultation bilaterally. GI: Soft, nontender, nondistended, BS + x 4. MS: no deformity or atrophy. Skin: warm and dry, no rash. Neuro:  Strength and sensation are intact. Psych: Normal affect.  EKG Interpretation Date/Time:  Tuesday January 02 2023 15:41:45 EDT Ventricular Rate:  70 PR Interval:  222 QRS Duration:  98 QT Interval:  428 QTC Calculation: 462 R Axis:   22  Text Interpretation: Sinus rhythm with 1st degree A-V block When compared with ECG of 02-Jan-2023 15:40, No significant change was found Confirmed by Joni Reining [phone]) on 01/02/2023 5:13:11 PM   Lab Results  Component Value Date   WBC 4.6 12/10/2022   HGB 11.3 (L) 12/10/2022   HCT 34.0 (L) 12/10/2022   MCV 96.9 12/10/2022   PLT 206 12/10/2022   Lab Results  Component Value Date   CREATININE 1.07 12/10/2022   BUN 19 12/10/2022   NA 138 12/10/2022   K 3.7 12/10/2022   CL 105 12/10/2022   CO2 23 12/10/2022   Lab Results  Component Value Date   ALT 31 12/10/2022   AST 58 (H) 12/10/2022   ALKPHOS 48 12/10/2022   BILITOT 0.8 12/10/2022   Lab Results  Component Value Date   CHOL 157 12/05/2022   HDL 43.30 12/05/2022   LDLCALC 83 12/05/2022   LDLDIRECT 76.0 02/05/2019   TRIG 155.0 (H) 12/05/2022   CHOLHDL 4 12/05/2022    Lab Results  Component Value Date   HGBA1C 5.6 01/09/2022     Review of Prior Studies  Echocardiogram 01/09/2022 EKG Interpretation Date/Time:  Tuesday January 02 2023 15:41:45 EDT Ventricular Rate:  70 PR Interval:  222 QRS Duration:  98 QT Interval:  428 QTC Calculation: 462 R Axis:   22  Text Interpretation: Sinus rhythm with 1st degree A-V block When compared with ECG of 02-Jan-2023 15:40, No significant change was found Confirmed by Joni Reining [phone]) on 01/02/2023 5:13:11 PM  1. Left ventricular  ejection fraction, by estimation, is 60 to 65%. The  left ventricle has normal function. The left ventricle has no regional  wall motion abnormalities. There is moderate left ventricular hypertrophy.  Left ventricular diastolic  parameters are consistent with Grade I diastolic dysfunction (impaired  relaxation). Elevated left atrial pressure.   2. Right ventricular systolic function is normal. The right ventricular  size is normal. Tricuspid regurgitation signal is inadequate for assessing  PA pressure.   3. The mitral valve was not well visualized. Trivial mitral valve  regurgitation. No evidence of mitral stenosis.   4. The aortic valve was not well visualized. Aortic valve regurgitation  is not visualized. No aortic stenosis is present.   5. Aortic dilatation noted. There is borderline dilatation of the aortic  root, measuring 38 mm.   Zio Monitor 03/01/2022 1. SR/SB 2. Occasional PACs/PVCs 3. Short runs of SVT  Assessment & Plan   1.  Noncardiac chest pain: This appears to be musculoskeletal in etiology while keeping arms above his head and handing tools the day before.  He  has had no recurrence of this and had been ruled out for ACS in ED.  2.  Chronic first-degree AV block: Noted on EKG today as well as in ED.  PR interval 0.22 ms.  No changes in his medical regimen at this time.  3.  Hypertension: Blood pressure is very well-controlled currently.  No changes in his medication regimen.  Would continue amlodipine, hydralazine, and losartan as directed.  Review of labs from ED, creatinine 1.07.  Potassium 3.7.  4.  Hypercholesterolemia: Currently on pravastatin 20 mg twice a week.  Has significant myalgias.  However patient states he has had chronic myalgias since he was 89 years old.  Some are managed with magnesium as well as bananas and mustard if they get bad.  Most recent lipid studies completed in July 2024, total cholesterol 157, LDL 83, triglycerides 155, HDL 43.          Signed, Bettey Mare. Liborio Nixon, ANP, AACC   01/02/2023 5:13 PM      Office [phone] Fax [phone]  Notice: This dictation was prepared with Dragon dictation along with smaller phrase technology. Any transcriptional errors that result from this process are unintentional and may not be corrected upon review.

## 2023-01-02 ENCOUNTER — Ambulatory Visit: Payer: Medicare Other | Attending: Adult Health | Admitting: Adult Health

## 2023-01-02 ENCOUNTER — Encounter: Payer: Self-pay | Admitting: Adult Health

## 2023-01-02 VITALS — BP 132/84 | HR 70 | Ht 69.0 in | Wt 219.4 lb

## 2023-01-02 DIAGNOSIS — I4891 Unspecified atrial fibrillation: Secondary | ICD-10-CM | POA: Diagnosis not present

## 2023-01-02 NOTE — Patient Instructions (Signed)
Medication Instructions:  No changes *If you need a refill on your cardiac medications before your next appointment, please call your pharmacy*   Lab Work: No Labs If you have labs (blood work) drawn today and your tests are completely normal, you will receive your results only by: MyChart Message (if you have MyChart) OR A paper copy in the mail If you have any lab test that is abnormal or we need to change your treatment, we will call you to review the results.   Testing/Procedures: No Testing   Follow-Up: At [facility], you and your health needs are our priority.  As part of our continuing mission to provide you with exceptional heart care, we have created designated Provider Care Teams.  These Care Teams include your primary Cardiologist (physician) and Advanced Practice Providers (APPs -  Physician Assistants and Nurse Practitioners) who all work together to provide you with the care you need, when you need it.  We recommend signing up for the patient portal called "MyChart".  Sign up information is provided on this After Visit Summary.  MyChart is used to connect with patients for Virtual Visits (Telemedicine).  Patients are able to view lab/test results, encounter notes, upcoming appointments, etc.  Non-urgent messages can be sent to your provider as well.   To learn more about what you can do with MyChart, go to [email or web address].    Your next appointment:   March 2025  Provider:   Nanetta Batty, MD

## 2023-01-04 ENCOUNTER — Encounter: Payer: Medicare Other | Admitting: Family Medicine

## 2023-01-30 ENCOUNTER — Other Ambulatory Visit: Payer: Self-pay | Admitting: Family Medicine

## 2023-02-20 ENCOUNTER — Other Ambulatory Visit: Payer: Self-pay | Admitting: Family Medicine

## 2023-03-05 ENCOUNTER — Ambulatory Visit (INDEPENDENT_AMBULATORY_CARE_PROVIDER_SITE_OTHER): Payer: Medicare Other

## 2023-03-05 VITALS — Ht 69.0 in | Wt 207.0 lb

## 2023-03-05 DIAGNOSIS — Z0000 Encounter for general adult medical examination without abnormal findings: Secondary | ICD-10-CM

## 2023-03-05 NOTE — Progress Notes (Signed)
Subjective:   [name] is a 89 y.o. male who presents for Medicare Annual/Subsequent preventive examination.  Visit Complete: Virtual I connected with  Karmen Stabs on 03/05/23 by a audio enabled telemedicine application and verified that I am speaking with the correct person using two identifiers.  Patient Location: Home  Provider Location: Home Office  I discussed the limitations of evaluation and management by telemedicine. The patient expressed understanding and agreed to proceed.  Vital Signs: Because this visit was a virtual/telehealth visit, some criteria may be missing or patient reported. Any vitals not documented were not able to be obtained and vitals that have been documented are patient reported.  Patient Medicare AWV questionnaire was completed by the patient on 03/05/2023; I have confirmed that all information answered by patient is correct and no changes since this date.  Cardiac Risk Factors include: advanced age (>19men, >30 women);dyslipidemia;male gender     Objective:    Today's Vitals   03/05/23 0802  Weight: 207 lb (93.9 kg)  Height: 5\' 9"  (1.753 m)   Body mass index is 30.57 kg/m.     03/05/2023    8:06 AM 12/10/2022   11:45 AM 03/29/2022    7:48 AM 02/22/2022    8:39 AM 01/09/2022    9:00 AM 01/09/2022   12:48 AM 01/08/2022    2:02 PM  Advanced Directives  Does Patient Have a Medical Advance Directive? Yes No No Yes No No No  Type of Estate agent of [place];Living will   Healthcare Power of Attorney     Does patient want to make changes to medical advance directive?    No - Patient declined     Copy of Healthcare Power of Attorney in Chart? No - copy requested        Would patient like information on creating a medical advance directive?   No - Patient declined  No - Patient declined No - Patient declined     Current Medications (verified) Outpatient Encounter Medications as of 03/05/2023  Medication Sig    acetaminophen (TYLENOL) 500 MG tablet Take 1-2 tablets (500-1,000 mg total) by mouth 2 (two) times daily as needed for moderate pain. (Patient taking differently: Take 500-1,000 mg by mouth 2 (two) times daily as needed for moderate pain (pain score 4-6). Pt states taking 3 tablets)   amLODipine (NORVASC) 10 MG tablet Take 1 tablet (10 mg total) by mouth daily.   azelastine (ASTELIN) 0.1 % nasal spray PLACE 1 TO 2 SPRAYS IN EACH NOSTRIL TWICE DAILY   cetirizine (ZYRTEC) 10 MG tablet Take 10 mg by mouth daily. Alternates weekly with allegra.   clopidogrel (PLAVIX) 75 MG tablet TAKE 1 TABLET(75 MG) BY MOUTH DAILY   Coenzyme Q10 100 MG capsule Take 1 capsule (100 mg total) by mouth daily.   cyanocobalamin (VITAMIN B12) 1000 MCG tablet Take 1 tablet (1,000 mcg total) by mouth daily.   donepezil (ARICEPT) 5 MG tablet TAKE 1 TABLET(5 MG) BY MOUTH AT BEDTIME   fexofenadine (ALLEGRA) 180 MG tablet Take 180 mg by mouth daily. Alternates weekly with zyrtec.   GLUCOSAMINE-CHONDROITIN PO Take 2 tablets by mouth daily.   hydrALAZINE (APRESOLINE) 50 MG tablet Take 1 tablet (50 mg total) by mouth 3 (three) times daily.   losartan (COZAAR) 25 MG tablet Take 1 tablet (25 mg total) by mouth daily.   Magnesium 500 MG CAPS Take 1 capsule (500 mg total) by mouth daily.   Multiple Vitamins-Minerals (MULTIVITAMIN ADULT PO)  Take 1 tablet by mouth daily.   pantoprazole (PROTONIX) 20 MG tablet TAKE 1 TABLET(20 MG) BY MOUTH DAILY   polyethylene glycol (MIRALAX / GLYCOLAX) packet Take 8.5 g by mouth daily.    pravastatin (PRAVACHOL) 20 MG tablet Take 1 tablet (20 mg total) by mouth 2 (two) times a week.   carboxymethylcellulose (REFRESH TEARS) 0.5 % SOLN Place 1 drop into both eyes daily as needed (dry eyes).   No facility-administered encounter medications on file as of 03/05/2023.    Allergies (verified) Lisinopril and Atorvastatin   History: Past Medical History:  Diagnosis Date   Actinic keratosis    Carotid  stenosis, asymptomatic, bilateral 09/16/2016   Diffuse 1-39% bilaterally, but involving ICA, ECA, CCA - rpt 1 yr (09/2016)   Complication of anesthesia    slow to wake up after a colonoscopy years ago   Diverticulitis    GERD (gastroesophageal reflux disease)    Gout    History of osteomyelitis 10/2015   left foot   History of squamous cell carcinoma of skin    HTN (hypertension)    OSA on CPAP 09/01/2016   Osteoarthritis    back, neck, knees - multiple joints   Personal history of malignant neoplasm of larynx 2009   s/p XRT, Skin Cancer- "pre cancerous"   Pre-diabetes    Seasonal allergies    Sensorineural hearing loss (SNHL) of both ears 09/01/2016   Squamous cell carcinoma of skin unknown   Treated in [place] - SCC of the L post auricular    Squamous cell carcinoma of skin 10/26/2022   R med knee, EDC   Past Surgical History:  Procedure Laterality Date   ACHILLES TENDON REPAIR Left    AMPUTATION Left 05/16/2019   LEFT GREAT TOE AMPUTATION for osteomyelitis Lajoyce Corners, Randa Evens, MD), 2nd toe removed   APPENDECTOMY     COLONOSCOPY     THROAT SURGERY N/A 2009   ?vocal cord vs larynx nodule removal, had XRT after this but states it was not cancer   TONSILLECTOMY     TREATMENT FISTULA ANAL     Family History  Problem Relation Age of Onset   Hypertension Mother    Hypertension Father    Cancer Other        unknown - niece   CAD Sister 33       stent (80% blockage)   CAD Brother 71       stent (100% blockage)   Social History   Socioeconomic History   Marital status: Married    Spouse name: Not on file   Number of children: Not on file   Years of education: Not on file   Highest education level: Bachelor's degree (e.g., BA, AB, BS)  Occupational History   Not on file  Tobacco Use   Smoking status: Never   Smokeless tobacco: Never  Vaping Use   Vaping status: Never Used  Substance and Sexual Activity   Alcohol use: Yes    Comment: occasional wine   Drug use: No    Sexual activity: Not on file  Other Topics Concern   Not on file  Social History Narrative   "[name]"   Widower. 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   Edu: college chemistry degree   Right handed   Activity: walking 9/10th mile daily    Caffeine: Drinks 1 cup of coffee every morning    Social Determinants of Health  Financial Resource Strain: Low Risk  (03/05/2023)   Overall Financial Resource Strain (CARDIA)    Difficulty of Paying Living Expenses: Not hard at all  Food Insecurity: No Food Insecurity (03/05/2023)   Hunger Vital Sign    Worried About Running Out of Food in the Last Year: Never true    Ran Out of Food in the Last Year: Never true  Transportation Needs: No Transportation Needs (03/05/2023)   PRAPARE - Administrator, Civil Service (Medical): No    Lack of Transportation (Non-Medical): No  Physical Activity: Sufficiently Active (03/05/2023)   Exercise Vital Sign    Days of Exercise per Week: 5 days    Minutes of Exercise per Session: 30 min  Stress: No Stress Concern Present (03/05/2023)   [name] of Occupational Health - Occupational Stress Questionnaire    Feeling of Stress : Not at all  Social Connections: Moderately Integrated (03/05/2023)   Social Connection and Isolation Panel [NHANES]    Frequency of Communication with Friends and Family: More than three times a week    Frequency of Social Gatherings with Friends and Family: More than three times a week    Attends Religious Services: More than 4 times per year    Active Member of [employer] or Organizations: No    Attends Engineer, structural: Never    Marital Status: Married    Tobacco Counseling Counseling given: Not Answered   Clinical Intake:  Pre-visit preparation completed: Yes  Pain : No/denies pain     Nutritional Risks: None Diabetes: No  How often do you need to have someone help you when you read  instructions, pamphlets, or other written materials from your doctor or pharmacy?: 1 - Never  Interpreter Needed?: No  Information entered by :: Renie Ora, LPN   Activities of Daily Living    03/05/2023    8:06 AM  In your present state of health, do you have any difficulty performing the following activities:  Hearing? 0  Vision? 0  Difficulty concentrating or making decisions? 0  Walking or climbing stairs? 0  Dressing or bathing? 0  Doing errands, shopping? 0  Preparing Food and eating ? N  Using the Toilet? N  In the past six months, have you accidently leaked urine? N  Do you have problems with loss of bowel control? N  Managing your Medications? N  Managing your Finances? N  Housekeeping or managing your Housekeeping? N    Patient Care Team: Eustaquio Boyden, MD as PCP - General (Family Medicine) Runell Gess, MD as PCP - Cardiology (Cardiology)  Indicate any recent Medical Services you may have received from other than [facility] providers in the past year (date may be approximate).     Assessment:   This is a routine wellness examination for [place].  Hearing/Vision screen Vision Screening - Comments:: Wears rx glasses - up to date with routine eye exams with  Dr,Brasington    Goals Addressed             This Visit's Progress    DIET - INCREASE WATER INTAKE   On track    Starting 08/28/2017, I will attempt to drink at least 6-8 glasses of water daily.        Depression Screen    03/05/2023    8:05 AM 12/05/2022    9:00 AM 09/05/2022    9:03 AM 02/22/2022    8:39 AM 02/16/2021    2:52 PM 05/19/2020  8:16 AM 02/11/2020   12:08 PM  PHQ 2/9 Scores  PHQ - 2 Score 0 0 0 0 0 0 0  PHQ- 9 Score  3     0    Fall Risk    03/05/2023    8:03 AM 12/05/2022    9:00 AM 02/22/2022    8:39 AM 02/16/2021    2:51 PM 05/19/2020    8:16 AM  Fall Risk   Falls in the past year? 0 0 0 0 0  Number falls in past yr: 0  0  0  Injury with Fall? 0  0  0  Risk  for fall due to : No Fall Risks  No Fall Risks    Follow up Falls prevention discussed  Falls evaluation completed  Falls evaluation completed    MEDICARE RISK AT HOME: Medicare Risk at Home Any stairs in or around the home?: No If so, are there any without handrails?: No Home free of loose throw rugs in walkways, pet beds, electrical cords, etc?: Yes Adequate lighting in your home to reduce risk of falls?: Yes Life alert?: No Use of a cane, walker or w/c?: No Grab bars in the bathroom?: Yes Shower chair or bench in shower?: Yes Elevated toilet seat or a handicapped toilet?: No  TIMED UP AND GO:  Was the test performed?  No    Cognitive Function:    02/11/2020   12:12 PM 08/28/2017   12:02 PM 08/23/2016   11:10 AM  MMSE - Mini Mental State Exam  Orientation to time 5 5 5   Orientation to Place 5 5 5   Registration 3 3 3   Attention/ Calculation 5 0 0  Recall 1 2 3   Recall-comments  unable to recall 1 of 3 words   Language- name 2 objects  0 0  Language- repeat 1 1 1   Language- follow 3 step command  3 3  Language- read & follow direction  0 0  Write a sentence  0 0  Copy design  0 0  Total score  19 20        03/05/2023    8:07 AM 02/22/2022    8:49 AM  6CIT Screen  What Year? 0 points 0 points  What month? 0 points 0 points  What time? 0 points 0 points  Count back from 20 0 points 0 points  Months in reverse 0 points 0 points  Repeat phrase 0 points 4 points  Total Score 0 points 4 points    Immunizations Immunization History  Administered Date(s) Administered   Fluad Quad(high Dose 65+) 02/17/2020, 03/06/2022   Hepatitis A 12/06/2012   Influenza, High Dose Seasonal PF 02/11/2018, 01/16/2019, 01/20/2021   Influenza-Unspecified 01/19/2016, 01/30/2017   PFIZER(Purple Top)SARS-COV-2 Vaccination 05/26/2019, 06/16/2019, 02/06/2020   Pfizer Covid-19 Vaccine Bivalent Booster 74yrs & up 02/01/2021   Pneumococcal Conjugate-13 07/21/2014   Pneumococcal  Polysaccharide-23 09/09/2010   Td 08/06/2004   Tdap 09/10/2012   Zoster, Live 02/06/2008    TDAP status: Due, Education has been provided regarding the importance of this vaccine. Advised may receive this vaccine at local pharmacy or Health Dept. Aware to provide a copy of the vaccination record if obtained from local pharmacy or Health Dept. Verbalized acceptance and understanding.  Flu Vaccine status: Up to date  Pneumococcal vaccine status: Up to date  Covid-19 vaccine status: Completed vaccines  Qualifies for Shingles Vaccine? Yes   Zostavax completed No   Shingrix Completed?: No.    Education has  been provided regarding the importance of this vaccine. Patient has been advised to call insurance company to determine out of pocket expense if they have not yet received this vaccine. Advised may also receive vaccine at local pharmacy or Health Dept. Verbalized acceptance and understanding.  Screening Tests Health Maintenance  Topic Date Due   Zoster Vaccines- Shingrix (1 of 2) 12/22/1950   DTaP/Tdap/Td (3 - Td or Tdap) 09/11/2022   INFLUENZA VACCINE  12/07/2022   COVID-19 Vaccine (5 - 2023-24 season) 01/07/2023   Medicare Annual Wellness (AWV)  03/04/2024   Pneumonia Vaccine 76+ Years old  Completed   HPV VACCINES  Aged Out    Health Maintenance  Health Maintenance Due  Topic Date Due   Zoster Vaccines- Shingrix (1 of 2) 12/22/1950   DTaP/Tdap/Td (3 - Td or Tdap) 09/11/2022   INFLUENZA VACCINE  12/07/2022   COVID-19 Vaccine (5 - 2023-24 season) 01/07/2023    Colorectal cancer screening: No longer required.   Lung Cancer Screening: (Low Dose CT Chest recommended if Age 61-80 years, 20 pack-year currently smoking OR have quit w/in 15years.) does not qualify.   Lung Cancer Screening Referral: n/a  Additional Screening:  Hepatitis C Screening: does not qualify;  Vision Screening: Recommended annual ophthalmology exams for early detection of glaucoma and other disorders  of the eye. Is the patient up to date with their annual eye exam?  Yes  Who is the provider or what is the name of the office in which the patient attends annual eye exams? Dr.Brashington  If pt is not established with a provider, would they like to be referred to a provider to establish care? No .   Dental Screening: Recommended annual dental exams for proper oral hygiene    Community Resource Referral / Chronic Care Management: CRR required this visit?  No   CCM required this visit?  No     Plan:     I have personally reviewed and noted the following in the patient's chart:   Medical and social history Use of alcohol, tobacco or illicit drugs  Current medications and supplements including opioid prescriptions. Patient is not currently taking opioid prescriptions. Functional ability and status Nutritional status Physical activity Advanced directives List of other physicians Hospitalizations, surgeries, and ER visits in previous 12 months Vitals Screenings to include cognitive, depression, and falls Referrals and appointments  In addition, I have reviewed and discussed with patient certain preventive protocols, quality metrics, and best practice recommendations. A written personalized care plan for preventive services as well as general preventive health recommendations were provided to patient.     Lorrene Reid, LPN   44/05/270   After Visit Summary: (MyChart) Due to this being a telephonic visit, the after visit summary with patients personalized plan was offered to patient via MyChart   Nurse Notes: none

## 2023-03-05 NOTE — Patient Instructions (Signed)
Mr. Rothstein , Thank you for taking time to come for your Medicare Wellness Visit. I appreciate your ongoing commitment to your health goals. Please review the following plan we discussed and let me know if I can assist you in the future.   Referrals/Orders/Follow-Ups/Clinician Recommendations: Aim for 30 minutes of exercise or brisk walking, 6-8 glasses of water, and 5 servings of fruits and vegetables each day.   This is a list of the screening recommended for you and due dates:  Health Maintenance  Topic Date Due   Zoster (Shingles) Vaccine (1 of 2) 12/22/1950   DTaP/Tdap/Td vaccine (3 - Td or Tdap) 09/11/2022   Flu Shot  12/07/2022   COVID-19 Vaccine (5 - 2023-24 season) 01/07/2023   Medicare Annual Wellness Visit  03/04/2024   Pneumonia Vaccine  Completed   HPV Vaccine  Aged Out    Advanced directives: (Copy Requested) Please bring a copy of your health care power of attorney and living will to the office to be added to your chart at your convenience.  Next Medicare Annual Wellness Visit scheduled for next year: Yes  insert Preventive Care attachment Insert FALL PREVENTION attachment if needed

## 2023-03-13 DIAGNOSIS — G4733 Obstructive sleep apnea (adult) (pediatric): Secondary | ICD-10-CM | POA: Diagnosis not present

## 2023-03-13 DIAGNOSIS — I10 Essential (primary) hypertension: Secondary | ICD-10-CM | POA: Diagnosis not present

## 2023-03-24 ENCOUNTER — Other Ambulatory Visit: Payer: Self-pay | Admitting: Family Medicine

## 2023-03-24 DIAGNOSIS — R7303 Prediabetes: Secondary | ICD-10-CM

## 2023-03-24 DIAGNOSIS — D649 Anemia, unspecified: Secondary | ICD-10-CM

## 2023-03-24 DIAGNOSIS — M1A00X0 Idiopathic chronic gout, unspecified site, without tophus (tophi): Secondary | ICD-10-CM

## 2023-03-24 DIAGNOSIS — E785 Hyperlipidemia, unspecified: Secondary | ICD-10-CM

## 2023-03-26 DIAGNOSIS — M9903 Segmental and somatic dysfunction of lumbar region: Secondary | ICD-10-CM | POA: Diagnosis not present

## 2023-03-26 DIAGNOSIS — M5416 Radiculopathy, lumbar region: Secondary | ICD-10-CM | POA: Diagnosis not present

## 2023-03-26 DIAGNOSIS — M955 Acquired deformity of pelvis: Secondary | ICD-10-CM | POA: Diagnosis not present

## 2023-03-26 DIAGNOSIS — M9905 Segmental and somatic dysfunction of pelvic region: Secondary | ICD-10-CM | POA: Diagnosis not present

## 2023-03-27 ENCOUNTER — Other Ambulatory Visit (INDEPENDENT_AMBULATORY_CARE_PROVIDER_SITE_OTHER): Payer: Medicare Other

## 2023-03-27 DIAGNOSIS — R7303 Prediabetes: Secondary | ICD-10-CM | POA: Diagnosis not present

## 2023-03-27 DIAGNOSIS — E785 Hyperlipidemia, unspecified: Secondary | ICD-10-CM | POA: Diagnosis not present

## 2023-03-27 DIAGNOSIS — D649 Anemia, unspecified: Secondary | ICD-10-CM | POA: Diagnosis not present

## 2023-03-27 DIAGNOSIS — M1A00X0 Idiopathic chronic gout, unspecified site, without tophus (tophi): Secondary | ICD-10-CM | POA: Diagnosis not present

## 2023-03-27 LAB — CBC WITH DIFFERENTIAL/PLATELET
Basophils Absolute: 0 10*3/uL (ref 0.0–0.1)
Basophils Relative: 0.7 % (ref 0.0–3.0)
Eosinophils Absolute: 0.1 10*3/uL (ref 0.0–0.7)
Eosinophils Relative: 2 % (ref 0.0–5.0)
HCT: 37.2 % — ABNORMAL LOW (ref 39.0–52.0)
Hemoglobin: 12.6 g/dL — ABNORMAL LOW (ref 13.0–17.0)
Lymphocytes Relative: 28.5 % (ref 12.0–46.0)
Lymphs Abs: 1.2 10*3/uL (ref 0.7–4.0)
MCHC: 33.8 g/dL (ref 30.0–36.0)
MCV: 95.9 fL (ref 78.0–100.0)
Monocytes Absolute: 0.4 10*3/uL (ref 0.1–1.0)
Monocytes Relative: 10.8 % (ref 3.0–12.0)
Neutro Abs: 2.4 10*3/uL (ref 1.4–7.7)
Neutrophils Relative %: 58 % (ref 43.0–77.0)
Platelets: 251 10*3/uL (ref 150.0–400.0)
RBC: 3.88 Mil/uL — ABNORMAL LOW (ref 4.22–5.81)
RDW: 14.2 % (ref 11.5–15.5)
WBC: 4.1 10*3/uL (ref 4.0–10.5)

## 2023-03-27 LAB — HEMOGLOBIN A1C: Hgb A1c MFr Bld: 5.7 % (ref 4.6–6.5)

## 2023-03-27 LAB — URIC ACID: Uric Acid, Serum: 4.5 mg/dL (ref 4.0–7.8)

## 2023-03-27 LAB — COMPREHENSIVE METABOLIC PANEL
ALT: 16 U/L (ref 0–53)
AST: 21 U/L (ref 0–37)
Albumin: 4.2 g/dL (ref 3.5–5.2)
Alkaline Phosphatase: 43 U/L (ref 39–117)
BUN: 18 mg/dL (ref 6–23)
CO2: 28 meq/L (ref 19–32)
Calcium: 9 mg/dL (ref 8.4–10.5)
Chloride: 103 meq/L (ref 96–112)
Creatinine, Ser: 1.1 mg/dL (ref 0.40–1.50)
GFR: 59.62 mL/min — ABNORMAL LOW (ref >60.00)
Glucose, Bld: 94 mg/dL (ref 70–99)
Potassium: 4 meq/L (ref 3.5–5.1)
Sodium: 139 meq/L (ref 135–145)
Total Bilirubin: 0.7 mg/dL (ref 0.2–1.2)
Total Protein: 6.8 g/dL (ref 6.0–8.3)

## 2023-03-27 LAB — LIPID PANEL
Cholesterol: 142 mg/dL (ref 0–200)
HDL: 39.5 mg/dL
LDL Cholesterol: 76 mg/dL (ref 0–99)
NonHDL: 102.63
Total CHOL/HDL Ratio: 4
Triglycerides: 134 mg/dL (ref 0.0–149.0)
VLDL: 26.8 mg/dL (ref 0.0–40.0)

## 2023-03-27 LAB — VITAMIN B12: Vitamin B-12: 804 pg/mL (ref 211–911)

## 2023-04-03 ENCOUNTER — Encounter: Payer: Medicare Other | Admitting: Family Medicine

## 2023-04-06 DIAGNOSIS — R110 Nausea: Secondary | ICD-10-CM | POA: Diagnosis not present

## 2023-04-06 DIAGNOSIS — I10 Essential (primary) hypertension: Secondary | ICD-10-CM | POA: Diagnosis not present

## 2023-04-06 DIAGNOSIS — R0789 Other chest pain: Secondary | ICD-10-CM | POA: Diagnosis not present

## 2023-04-06 DIAGNOSIS — Z743 Need for continuous supervision: Secondary | ICD-10-CM | POA: Diagnosis not present

## 2023-04-06 DIAGNOSIS — R079 Chest pain, unspecified: Secondary | ICD-10-CM | POA: Diagnosis not present

## 2023-04-06 DIAGNOSIS — I4891 Unspecified atrial fibrillation: Secondary | ICD-10-CM | POA: Diagnosis not present

## 2023-04-06 DIAGNOSIS — Z8673 Personal history of transient ischemic attack (TIA), and cerebral infarction without residual deficits: Secondary | ICD-10-CM | POA: Diagnosis not present

## 2023-04-06 DIAGNOSIS — Z85828 Personal history of other malignant neoplasm of skin: Secondary | ICD-10-CM | POA: Diagnosis not present

## 2023-04-13 ENCOUNTER — Telehealth: Payer: Self-pay

## 2023-04-13 ENCOUNTER — Encounter: Payer: Self-pay | Admitting: Family Medicine

## 2023-04-13 ENCOUNTER — Ambulatory Visit: Payer: Medicare Other | Admitting: Family Medicine

## 2023-04-13 VITALS — BP 152/64 | HR 60 | Temp 98.1°F | Ht 67.5 in | Wt 212.2 lb

## 2023-04-13 DIAGNOSIS — S98131A Complete traumatic amputation of one right lesser toe, initial encounter: Secondary | ICD-10-CM

## 2023-04-13 DIAGNOSIS — I10 Essential (primary) hypertension: Secondary | ICD-10-CM

## 2023-04-13 DIAGNOSIS — K219 Gastro-esophageal reflux disease without esophagitis: Secondary | ICD-10-CM

## 2023-04-13 DIAGNOSIS — G3184 Mild cognitive impairment, so stated: Secondary | ICD-10-CM

## 2023-04-13 DIAGNOSIS — Z0000 Encounter for general adult medical examination without abnormal findings: Secondary | ICD-10-CM

## 2023-04-13 DIAGNOSIS — Z8673 Personal history of transient ischemic attack (TIA), and cerebral infarction without residual deficits: Secondary | ICD-10-CM

## 2023-04-13 DIAGNOSIS — Z23 Encounter for immunization: Secondary | ICD-10-CM | POA: Diagnosis not present

## 2023-04-13 DIAGNOSIS — Z0001 Encounter for general adult medical examination with abnormal findings: Secondary | ICD-10-CM

## 2023-04-13 DIAGNOSIS — I6523 Occlusion and stenosis of bilateral carotid arteries: Secondary | ICD-10-CM

## 2023-04-13 DIAGNOSIS — J3089 Other allergic rhinitis: Secondary | ICD-10-CM

## 2023-04-13 DIAGNOSIS — M150 Primary generalized (osteo)arthritis: Secondary | ICD-10-CM

## 2023-04-13 DIAGNOSIS — Z6832 Body mass index (BMI) 32.0-32.9, adult: Secondary | ICD-10-CM

## 2023-04-13 DIAGNOSIS — R7303 Prediabetes: Secondary | ICD-10-CM

## 2023-04-13 DIAGNOSIS — D649 Anemia, unspecified: Secondary | ICD-10-CM | POA: Diagnosis not present

## 2023-04-13 DIAGNOSIS — E66811 Obesity, class 1: Secondary | ICD-10-CM

## 2023-04-13 DIAGNOSIS — E785 Hyperlipidemia, unspecified: Secondary | ICD-10-CM

## 2023-04-13 DIAGNOSIS — R001 Bradycardia, unspecified: Secondary | ICD-10-CM

## 2023-04-13 DIAGNOSIS — G4733 Obstructive sleep apnea (adult) (pediatric): Secondary | ICD-10-CM

## 2023-04-13 DIAGNOSIS — K5900 Constipation, unspecified: Secondary | ICD-10-CM

## 2023-04-13 DIAGNOSIS — M1A00X0 Idiopathic chronic gout, unspecified site, without tophus (tophi): Secondary | ICD-10-CM

## 2023-04-13 DIAGNOSIS — Z7189 Other specified counseling: Secondary | ICD-10-CM

## 2023-04-13 DIAGNOSIS — H903 Sensorineural hearing loss, bilateral: Secondary | ICD-10-CM

## 2023-04-13 MED ORDER — HYDRALAZINE HCL 50 MG PO TABS: 50.0000 mg | ORAL_TABLET | Freq: Three times a day (TID) | ORAL | 4 refills | Status: DC

## 2023-04-13 MED ORDER — CLOPIDOGREL BISULFATE 75 MG PO TABS: 75.0000 mg | ORAL_TABLET | Freq: Every day | ORAL | 4 refills | Status: DC

## 2023-04-13 MED ORDER — PRAVASTATIN SODIUM 20 MG PO TABS: 20.0000 mg | ORAL_TABLET | ORAL | 4 refills | Status: DC

## 2023-04-13 MED ORDER — PANTOPRAZOLE SODIUM 20 MG PO TBEC: 20.0000 mg | DELAYED_RELEASE_TABLET | Freq: Every day | ORAL | 4 refills | Status: DC

## 2023-04-13 MED ORDER — AMLODIPINE BESYLATE 10 MG PO TABS: 10.0000 mg | ORAL_TABLET | Freq: Every day | ORAL | 4 refills | Status: DC

## 2023-04-13 MED ORDER — DONEPEZIL HCL 5 MG PO TABS: 5.0000 mg | ORAL_TABLET | Freq: Every day | ORAL | 4 refills | Status: DC

## 2023-04-13 MED ORDER — LOSARTAN POTASSIUM 50 MG PO TABS: 50.0000 mg | ORAL_TABLET | Freq: Every day | ORAL | 4 refills | Status: DC

## 2023-04-13 MED ORDER — AZELASTINE HCL 0.1 % NA SOLN: 1.0000 | Freq: Two times a day (BID) | NASAL | 12 refills | Status: DC

## 2023-04-13 NOTE — Telephone Encounter (Signed)
Patient walked into clinic with question about metoprolol 25mg .  He had pill bottle for medication that was filled in July 2024.  Pt said that the medication was not on the list that he had from him most recent visit with Dr. Reece Agar.  Reviewed chart and found that medication was stopped due to bradycardia.  Explained that to pt.  Forensic psychologist for help with the issue.  Will remove this medication from his cabinet.

## 2023-04-13 NOTE — Patient Instructions (Addendum)
Flu shot today  Blood pressure is staying high - increase losartan to 50mg  daily - new dose sent to pharmacy. You can double up on 25mg  dose tablets until you run out. Continue monitoring blood pressures at home, BP log sheet provided today.  If interested, check with pharmacy about 2 shot shingles series (shingrix).  Cut miralax to 1/2 capful daily. Let me know if ongoing loose stools. Alternatively you could try taking 1 sennakot /senna daily as needed for constipation. Ensure good water and fiber intake.  Ensure you're taking a whole tablet of pravastatin 20mg  twice weekly  Return in 3 months for follow up visit

## 2023-04-13 NOTE — Progress Notes (Unsigned)
Ph: [phone] Fax: [phone]   Patient ID: [name], male    DOB: 07/25/1931, 89 y.o.   MRN: [id number]  This visit was conducted in person.  BP (!) 152/64 (BP Location: Right Arm, Cuff Size: Normal)   Pulse 60   Temp 98.1 F (36.7 C) (Temporal)   Ht 5' 7.5" (1.715 m)   Wt 212 lb 4 oz (96.3 kg)   SpO2 94%   BMI 32.75 kg/m   BP Readings from Last 3 Encounters:  04/13/23 (!) 152/64  01/02/23 132/84  12/25/22 (!) 158/60    CC: CPE Subjective:   HPI: [name] is a 89 y.o. male presenting on 04/13/2023 for Annual Exam (MCR prt 2 [AWV- 03/05/23. Pt wants to make Dr Reece Agar aware he was seen at ED on 04/06/23. Also, pt requests note for work stating he needs to work part time vs full time at [employer] to keep from getting fired. 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 Note reviewed.   Requests letter asking for medical excuse to work part time 5 hours a day due to medical condition. Letter provided.   No results found.  Flowsheet Row Clinical Support from 03/05/2023 in [facility] [place]  PHQ-2 Total Score 0          03/05/2023    8:03 AM 12/05/2022    9:00 AM 02/22/2022    8:39 AM 02/16/2021    2:51 PM 05/19/2020    8:16 AM  Fall Risk   Falls in the past year? 0 0 0 0 0  Number falls in past yr: 0  0  0  Injury with Fall? 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 6 lb weight loss with increased activity at work.  OSA on CPAP followed by [facility].    S/p stroke 01/2022 presenting with imbalance/dizziness. Brain imaging at that time showed ischemic nonhemorrhagic infarct of central cerebellum, treated with DAPT x 3 mo then plavix alone. Aspirin stopped 04/2022. Found to have RICA 70%, LICA 50% stenosis, as well as R subclavian stenosis 85% at  origin and occluded L vertebral artery - thought noncontributory.  Last saw cardiology 12/2022 for noncardiac chest pain.  Saw neurology - f/u planned PRN.   HTN - continues amlodipine 10mg  daily, losartan 25mg  daily, hydralazine 50mg  TID Forgot BP log sheet at home. At home he notes they're running high like today Avoiding BB in setting of bradycardia.   He stopped allopurinol early 2024.   Recent ER eval at [facility] in [facility] 04/06/2023 for chest discomfort "woke up with heartburn" - treated with pepto bismo with benefit, but he also had severe upper chest pain near neck. Records in [facility] reviewed. Hgb 12.0, glu 159, eGFR 58, BNP 138, CXR showed mild interstitial pulm edema. Otherwise normal including TnI. Pain has since resolved.   Preventative: Colon cancer screening - normal colonoscopies, last ~2009 - aged out  Prostate cancer screening - age out  Lung cancer screening - not eligible  Flu shot - yearly  COVID vaccine - Pfizer 06/2019, 07/2019, booster 02/2020, bivalent booster 01/2021 Tdap 2014 Pneumovax 2012, Prevnar-13 2016 zostavax 2009 shingrix - discussed, to check at pharmacy  Advanced directive discussion - needs updating.  Would want wife to be HCPOA. Packet previously provided.  Seat belt use discussed.  Sunscreen use discussed. No changing moles on skin. Sees derm tomorrow.  Non smoker. Significant second hand smoker.  Alcohol - was heavy drinker, has cut down significantly - 1 beer/month  Dentist q6 mo  Eye exam yearly ([place])  Bowel - constipation managed with daily miralax - sometimes too much Bladder - no incontinence, he's cut down on caffeine intake.    Widower. 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 Interim medical history since our last visit reviewed. Allergies and medications reviewed and updated. Outpatient Medications Prior to Visit  Medication Sig Dispense Refill   acetaminophen (TYLENOL) 500 MG tablet Take 1-2 tablets (500-1,000 mg total) by mouth 2 (two) times daily as needed for moderate pain. (Patient taking differently: Take 500-1,000 mg by mouth 2 (two) times daily as needed for moderate pain (pain score 4-6). Pt states taking 3 tablets)     carboxymethylcellulose (REFRESH TEARS) 0.5 % SOLN Place 1 drop into both eyes daily as needed (dry eyes).     cetirizine (ZYRTEC) 10 MG tablet Take 10 mg by mouth daily. Alternates weekly with allegra.     Coenzyme Q10 100 MG capsule Take 1 capsule (100 mg total) by mouth daily.     cyanocobalamin (VITAMIN B12) 1000 MCG tablet Take 1 tablet (1,000 mcg total) by mouth daily.     fexofenadine (ALLEGRA) 180 MG tablet Take 180 mg by mouth daily. Alternates weekly with zyrtec.     GLUCOSAMINE-CHONDROITIN PO Take 2 tablets by mouth daily.     Magnesium 500 MG CAPS Take 1 capsule (500 mg total) by mouth daily.     Multiple Vitamins-Minerals (MULTIVITAMIN ADULT PO) Take 1 tablet by mouth daily.     polyethylene glycol (MIRALAX / GLYCOLAX) packet Take 8.5 g by mouth daily.      amLODipine (NORVASC) 10 MG tablet Take 1 tablet (10 mg total) by mouth daily. 90 tablet 2   azelastine (ASTELIN) 0.1 % nasal spray PLACE 1 TO 2 SPRAYS IN EACH NOSTRIL TWICE DAILY 30 mL 2   clopidogrel (PLAVIX) 75 MG tablet TAKE 1 TABLET(75 MG) BY MOUTH DAILY 90 tablet 1   donepezil (ARICEPT) 5 MG tablet TAKE 1 TABLET(5 MG) BY MOUTH AT BEDTIME 30 tablet 8   hydrALAZINE (APRESOLINE) 50 MG tablet Take 1 tablet (50 mg total) by mouth 3 (three) times daily. 270 tablet 1   losartan (COZAAR) 25 MG tablet Take 1 tablet (25 mg total) by mouth daily. 90 tablet 1   pantoprazole (PROTONIX) 20  MG tablet TAKE 1 TABLET(20 MG) BY MOUTH DAILY 90 tablet 0   pravastatin (PRAVACHOL) 20 MG tablet Take 1 tablet (20 mg total) by mouth 2 (two) times a week. 24 tablet 1   No facility-administered medications prior to visit.     Per HPI unless specifically indicated in ROS section below Review of Systems  Constitutional:  Negative for activity change, appetite change, chills, fatigue, fever and unexpected weight change.  HENT:  Negative for hearing loss.   Eyes:  Negative for visual disturbance.  Respiratory:  Positive for chest tightness. Negative for cough, shortness of breath and  wheezing.   Cardiovascular:  Positive for chest pain (see HPI). Negative for palpitations and leg swelling.  Gastrointestinal:  Positive for constipation and diarrhea. Negative for abdominal distention, abdominal pain, blood in stool, nausea and vomiting.  Genitourinary:  Negative for difficulty urinating and hematuria.  Musculoskeletal:  Negative for arthralgias, myalgias and neck pain.  Skin:  Negative for rash.  Neurological:  Negative for dizziness, seizures, syncope and headaches.  Hematological:  Negative for adenopathy. Does not bruise/bleed easily.  Psychiatric/Behavioral:  Negative for dysphoric mood. The patient is not nervous/anxious.     Objective:  BP (!) 152/64 (BP Location: Right Arm, Cuff Size: Normal)   Pulse 60   Temp 98.1 F (36.7 C) (Temporal)   Ht 5' 7.5" (1.715 m)   Wt 212 lb 4 oz (96.3 kg)   SpO2 94%   BMI 32.75 kg/m   Wt Readings from Last 3 Encounters:  04/13/23 212 lb 4 oz (96.3 kg)  03/05/23 207 lb (93.9 kg)  01/02/23 219 lb 6.4 oz (99.5 kg)      Physical Exam Vitals and nursing note reviewed.  Constitutional:      General: He is not in acute distress.    Appearance: Normal appearance. He is well-developed. He is not ill-appearing.  HENT:     Head: Normocephalic and atraumatic.     Right Ear: Hearing, tympanic membrane, ear canal and external ear normal.     Left Ear:  Hearing, tympanic membrane, ear canal and external ear normal.     Mouth/Throat:     Mouth: Mucous membranes are moist.     Pharynx: Oropharynx is clear. No oropharyngeal exudate or posterior oropharyngeal erythema.  Eyes:     General: No scleral icterus.    Extraocular Movements: Extraocular movements intact.     Conjunctiva/sclera: Conjunctivae normal.     Pupils: Pupils are equal, round, and reactive to light.  Neck:     Thyroid: No thyroid mass or thyromegaly.     Vascular: Carotid bruit present.  Cardiovascular:     Rate and Rhythm: Normal rate and regular rhythm.     Pulses: Normal pulses.          Radial pulses are 2+ on the right side and 2+ on the left side.     Heart sounds: Normal heart sounds. No murmur heard. Pulmonary:     Effort: Pulmonary effort is normal. No respiratory distress.     Breath sounds: Normal breath sounds. No wheezing, rhonchi or rales.  Abdominal:     General: Bowel sounds are normal. There is no distension.     Palpations: Abdomen is soft. There is no mass.     Tenderness: There is no abdominal tenderness. There is no guarding or rebound.     Hernia: No hernia is present.  Musculoskeletal:        General: Normal range of motion.     Cervical back: Normal range of motion and neck supple.     Right lower leg: No edema.     Left lower leg: No edema.  Lymphadenopathy:     Cervical: No cervical adenopathy.  Skin:    General: Skin is warm and dry.     Findings: No rash.  Neurological:     General: No focal deficit present.     Mental Status: He is alert and oriented to person, place, and time.  Psychiatric:        Mood and Affect: Mood normal.  Behavior: Behavior normal.        Thought Content: Thought content normal.        Judgment: Judgment normal.       Results for orders placed or performed in visit on 03/27/23  Vitamin B12  Result Value Ref Range   Vitamin B-12 804 211 - 911 pg/mL  Uric acid  Result Value Ref Range   Uric  Acid, Serum 4.5 4.0 - 7.8 mg/dL  CBC with Differential/Platelet  Result Value Ref Range   WBC 4.1 4.0 - 10.5 K/uL   RBC 3.88 (L) 4.22 - 5.81 Mil/uL   Hemoglobin 12.6 (L) 13.0 - 17.0 g/dL   HCT 47.4 (L) 25.9 - 56.3 %   MCV 95.9 78.0 - 100.0 fl   MCHC 33.8 30.0 - 36.0 g/dL   RDW 87.5 64.3 - 32.9 %   Platelets 251.0 150.0 - 400.0 K/uL   Neutrophils Relative % 58.0 43.0 - 77.0 %   Lymphocytes Relative 28.5 12.0 - 46.0 %   Monocytes Relative 10.8 3.0 - 12.0 %   Eosinophils Relative 2.0 0.0 - 5.0 %   Basophils Relative 0.7 0.0 - 3.0 %   Neutro Abs 2.4 1.4 - 7.7 K/uL   Lymphs Abs 1.2 0.7 - 4.0 K/uL   Monocytes Absolute 0.4 0.1 - 1.0 K/uL   Eosinophils Absolute 0.1 0.0 - 0.7 K/uL   Basophils Absolute 0.0 0.0 - 0.1 K/uL  Hemoglobin A1c  Result Value Ref Range   Hgb A1c MFr Bld 5.7 4.6 - 6.5 %  Comprehensive metabolic panel  Result Value Ref Range   Sodium 139 135 - 145 mEq/L   Potassium 4.0 3.5 - 5.1 mEq/L   Chloride 103 96 - 112 mEq/L   CO2 28 19 - 32 mEq/L   Glucose, Bld 94 70 - 99 mg/dL   BUN 18 6 - 23 mg/dL   Creatinine, Ser 5.18 0.40 - 1.50 mg/dL   Total Bilirubin 0.7 0.2 - 1.2 mg/dL   Alkaline Phosphatase 43 39 - 117 U/L   AST 21 0 - 37 U/L   ALT 16 0 - 53 U/L   Total Protein 6.8 6.0 - 8.3 g/dL   Albumin 4.2 3.5 - 5.2 g/dL   GFR 84.16 (L) >60.63 mL/min   Calcium 9.0 8.4 - 10.5 mg/dL  Lipid panel  Result Value Ref Range   Cholesterol 142 0 - 200 mg/dL   Triglycerides 016.0 0.0 - 149.0 mg/dL   HDL 10.93 >23.55 mg/dL   VLDL 73.2 0.0 - 20.2 mg/dL   LDL Cholesterol 76 0 - 99 mg/dL   Total CHOL/HDL Ratio 4    NonHDL 102.63     Assessment & Plan:   Problem List Items Addressed This Visit     Advanced care planning/counseling discussion (Chronic)    Advanced directive discussion - needs updating. Would want wife to be HCPOA. Packet previously provided.       Health maintenance examination - Primary (Chronic)    Preventative protocols reviewed and updated unless pt  declined. Discussed healthy diet and lifestyle.       GERD (gastroesophageal reflux disease)    Continues pantoprazole 20mg  daily.        Relevant Medications   pantoprazole (PROTONIX) 20 MG tablet   Gout    Stable period off allopurinol without recent gout flare.       HTN (hypertension)    Chronic, BP above goal despite losartan 25mg  daily, hydralazine 50mg  TID, and amlodipine 10mg  daily. He also  notes home readings run higher than ideal.  Will increase losartan to 50mg  daily.  Avoiding BB in h/o bradycardia and 1st degree AV block.  RTC 3 mo HTN f/u visit.       Relevant Medications   amLODipine (NORVASC) 10 MG tablet   hydrALAZINE (APRESOLINE) 50 MG tablet   losartan (COZAAR) 50 MG tablet   pravastatin (PRAVACHOL) 20 MG tablet (Start on 04/16/2023)   Osteoarthritis   OSA on CPAP    This is followed by Dr Frances Furbish neurology, last seen 02/2022.       Sensorineural hearing loss (SNHL) of both ears   Obesity, Class I, BMI 30.0-34.9 (see actual BMI)    Encourage healthy diet and lifestyle choices to affect sustainable weight loss. Obesity complicated by comorbidities of stroke, hypertension, hyperlipidemia, osteoarthritis, sleep apnea.       Carotid stenosis, asymptomatic, bilateral    Continues plavix daily. Known R>L carotid stenosis as well as R subclavian stenosis and L vertrebral occlusion by neck CTA 01/2022.  Will update carotid US.  He does not see vascular surgeon.       Relevant Medications   amLODipine (NORVASC) 10 MG tablet   clopidogrel (PLAVIX) 75 MG tablet   hydrALAZINE (APRESOLINE) 50 MG tablet   losartan (COZAAR) 50 MG tablet   pravastatin (PRAVACHOL) 20 MG tablet (Start on 04/16/2023)   Other Relevant Orders   VAS US CAROTID   Dyslipidemia    Chronic, stable on pravastatin 20mg  twice weekly - continue The ASCVD Risk score (Arnett DK, et al., 2019) failed to calculate for the following reasons:   The 2019 ASCVD risk score is only valid for ages 26 to  45   The patient has a prior MI or stroke diagnosis        Relevant Medications   pravastatin (PRAVACHOL) 20 MG tablet (Start on 04/16/2023)   Amputation of one or more toes (HCC)   MCI (mild cognitive impairment)    Notes ongoing memory impairment.  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 No change in level of care need.  Continue low dose aricept 5mg , titration limited by bradycardia.      Relevant Medications   donepezil (ARICEPT) 5 MG tablet   Allergic rhinitis    Continue OTC oral antihistamine and nasal astelin      Relevant Medications   azelastine (ASTELIN) 0.1 % nasal spray   Anemia    Remains mildly anemic - will continue to monitor.  No blood in stool or urine noted       History of cerebellar stroke    Continue plavix.       Prediabetes    Encouraged limiting added sugar in diet.       Sinus bradycardia    Avoid beta blockers.       Relevant Medications   amLODipine (NORVASC) 10 MG tablet   hydrALAZINE (APRESOLINE) 50 MG tablet   losartan (COZAAR) 50 MG tablet   pravastatin (PRAVACHOL) 20 MG tablet (Start on 04/16/2023)   Constipation    Ongoing difficulty with this, sometimes miralax causes stool urgency and accidents.  It seems he's currently taking 1 small capful daily- rec drop to 1/2 capful daily (8.5 gm), hold for loose stools/diarrhea.  Also discussed PRN OTC senna use and encouraged good hydration and increased dietary fiber intake.       Other Visit Diagnoses     Encounter for immunization       Relevant Orders   Flu Vaccine Trivalent  High Dose (Fluad) (Completed)        Meds ordered this encounter  Medications   amLODipine (NORVASC) 10 MG tablet    Sig: Take 1 tablet (10 mg total) by mouth daily.    Dispense:  90 tablet    Refill:  4   azelastine (ASTELIN) 0.1 % nasal spray    Sig: Place 1 spray into both nostrils 2 (two) times daily. Use in each nostril as directed    Dispense:  30 mL    Refill:  12    clopidogrel (PLAVIX) 75 MG tablet    Sig: Take 1 tablet (75 mg total) by mouth daily.    Dispense:  90 tablet    Refill:  4   donepezil (ARICEPT) 5 MG tablet    Sig: Take 1 tablet (5 mg total) by mouth at bedtime.    Dispense:  90 tablet    Refill:  4   hydrALAZINE (APRESOLINE) 50 MG tablet    Sig: Take 1 tablet (50 mg total) by mouth 3 (three) times daily.    Dispense:  270 tablet    Refill:  4   losartan (COZAAR) 50 MG tablet    Sig: Take 1 tablet (50 mg total) by mouth daily.    Dispense:  90 tablet    Refill:  4    Note new dose   pantoprazole (PROTONIX) 20 MG tablet    Sig: Take 1 tablet (20 mg total) by mouth daily.    Dispense:  90 tablet    Refill:  4   pravastatin (PRAVACHOL) 20 MG tablet    Sig: Take 1 tablet (20 mg total) by mouth 2 (two) times a week.    Dispense:  24 tablet    Refill:  4    Orders Placed This Encounter  Procedures   Flu Vaccine Trivalent High Dose (Fluad)    Patient Instructions  Flu shot today  Blood pressure is staying high - increase losartan to 50mg  daily - new dose sent to pharmacy. You can double up on 25mg  dose tablets until you run out. Continue monitoring blood pressures at home, BP log sheet provided today.  If interested, check with pharmacy about 2 shot shingles series (shingrix).  Cut miralax to 1/2 capful daily. Let me know if ongoing loose stools. Alternatively you could try taking 1 sennakot /senna daily as needed for constipation. Ensure good water and fiber intake.  Ensure you're taking a whole tablet of pravastatin 20mg  twice weekly  Return in 3 months for follow up visit   Follow up plan: Return in about 3 months (around 07/12/2023), or if symptoms worsen or fail to improve, for follow up visit.  Eustaquio Boyden, MD

## 2023-04-14 ENCOUNTER — Encounter: Payer: Self-pay | Admitting: Family Medicine

## 2023-04-14 DIAGNOSIS — K5900 Constipation, unspecified: Secondary | ICD-10-CM | POA: Insufficient documentation

## 2023-04-14 DIAGNOSIS — K5909 Other constipation: Secondary | ICD-10-CM | POA: Insufficient documentation

## 2023-04-14 NOTE — Assessment & Plan Note (Signed)
Encouraged limiting added sugar in diet.  

## 2023-04-14 NOTE — Assessment & Plan Note (Signed)
Continue OTC oral antihistamine and nasal astelin

## 2023-04-14 NOTE — Assessment & Plan Note (Signed)
Advanced directive discussion - needs updating. Would want wife to be HCPOA. Packet previously provided.

## 2023-04-14 NOTE — Assessment & Plan Note (Signed)
Remains mildly anemic - will continue to monitor.  No blood in stool or urine noted

## 2023-04-14 NOTE — Assessment & Plan Note (Signed)
Ongoing difficulty with this, sometimes miralax causes stool urgency and accidents.  It seems he's currently taking 1 small capful daily- rec drop to 1/2 capful daily (8.5 gm), hold for loose stools/diarrhea.  Also discussed PRN OTC senna use and encouraged good hydration and increased dietary fiber intake.

## 2023-04-14 NOTE — Assessment & Plan Note (Addendum)
This is followed by Dr Frances Furbish neurology, last seen 02/2022.

## 2023-04-14 NOTE — Assessment & Plan Note (Signed)
Preventative protocols reviewed and updated unless pt declined. Discussed healthy diet and lifestyle.  

## 2023-04-14 NOTE — Assessment & Plan Note (Signed)
Continue plavix °

## 2023-04-14 NOTE — Assessment & Plan Note (Signed)
Chronic, stable on pravastatin 20mg  twice weekly - continue The ASCVD Risk score (Arnett DK, et al., 2019) failed to calculate for the following reasons:   The 2019 ASCVD risk score is only valid for ages 68 to 56   The patient has a prior MI or stroke diagnosis

## 2023-04-14 NOTE — Assessment & Plan Note (Signed)
Encourage healthy diet and lifestyle choices to affect sustainable weight loss. Obesity complicated by comorbidities of stroke, hypertension, hyperlipidemia, osteoarthritis, sleep apnea.

## 2023-04-14 NOTE — Assessment & Plan Note (Signed)
Stable period off allopurinol without recent gout flare.

## 2023-04-14 NOTE — Assessment & Plan Note (Addendum)
Continues plavix daily. Known R>L carotid stenosis as well as R subclavian stenosis and L vertrebral occlusion by neck CTA 01/2022.  Will update carotid US.  He does not see vascular surgeon.

## 2023-04-14 NOTE — Assessment & Plan Note (Signed)
Continues pantoprazole 20mg  daily.

## 2023-04-14 NOTE — Assessment & Plan Note (Addendum)
Chronic, BP above goal despite losartan 25mg  daily, hydralazine 50mg  TID, and amlodipine 10mg  daily. He also notes home readings run higher than ideal.  Will increase losartan to 50mg  daily.  Avoiding BB in h/o bradycardia and 1st degree AV block.  RTC 3 mo HTN f/u visit.

## 2023-04-14 NOTE — Assessment & Plan Note (Signed)
Avoid beta blockers.  

## 2023-04-14 NOTE — Assessment & Plan Note (Addendum)
Notes ongoing memory impairment.  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 No change in level of care need.  Continue low dose aricept 5mg , titration limited by bradycardia.

## 2023-04-19 ENCOUNTER — Ambulatory Visit: Payer: Medicare Other | Admitting: Dermatology

## 2023-04-19 ENCOUNTER — Encounter: Payer: Self-pay | Admitting: Dermatology

## 2023-04-19 DIAGNOSIS — L578 Other skin changes due to chronic exposure to nonionizing radiation: Secondary | ICD-10-CM

## 2023-04-19 DIAGNOSIS — Z7189 Other specified counseling: Secondary | ICD-10-CM

## 2023-04-19 DIAGNOSIS — Z8589 Personal history of malignant neoplasm of other organs and systems: Secondary | ICD-10-CM

## 2023-04-19 DIAGNOSIS — D229 Melanocytic nevi, unspecified: Secondary | ICD-10-CM

## 2023-04-19 DIAGNOSIS — Z1283 Encounter for screening for malignant neoplasm of skin: Secondary | ICD-10-CM

## 2023-04-19 DIAGNOSIS — L820 Inflamed seborrheic keratosis: Secondary | ICD-10-CM | POA: Diagnosis not present

## 2023-04-19 DIAGNOSIS — L570 Actinic keratosis: Secondary | ICD-10-CM | POA: Diagnosis not present

## 2023-04-19 DIAGNOSIS — W908XXA Exposure to other nonionizing radiation, initial encounter: Secondary | ICD-10-CM

## 2023-04-19 DIAGNOSIS — Z85828 Personal history of other malignant neoplasm of skin: Secondary | ICD-10-CM

## 2023-04-19 DIAGNOSIS — B029 Zoster without complications: Secondary | ICD-10-CM | POA: Diagnosis not present

## 2023-04-19 DIAGNOSIS — D1801 Hemangioma of skin and subcutaneous tissue: Secondary | ICD-10-CM

## 2023-04-19 DIAGNOSIS — L821 Other seborrheic keratosis: Secondary | ICD-10-CM

## 2023-04-19 DIAGNOSIS — L814 Other melanin hyperpigmentation: Secondary | ICD-10-CM

## 2023-04-19 DIAGNOSIS — Z79899 Other long term (current) drug therapy: Secondary | ICD-10-CM

## 2023-04-19 MED ORDER — TRIAMCINOLONE ACETONIDE 0.1 % EX CREA: TOPICAL_CREAM | CUTANEOUS | 0 refills | Status: AC

## 2023-04-19 NOTE — Patient Instructions (Addendum)
Start triamcinolone 0.1% cream - apply to rash on right hip twice daily until improved. Avoid applying to face, groin, and axilla. Use as directed. Long-term use can cause thinning of the skin.  Discussed viral etiology (herpes zoster) and risk of contagion. Before blisters dry up and heal, shingles rash can cause chickenpox in people who have never had it or have never been vaccinated against it, if they are exposed to the virus.  Keep area covered.  Avoid contact with pregnant women. Shingles can also cause significant pain or unusual skin sensations (post-herpetic neuralgia) which may persist after rash has resolved.  Shingles rash may leave dyspigmented scars on skin once healed.   Due to recent changes in healthcare laws, you may see results of your pathology and/or laboratory studies on MyChart before the doctors have had a chance to review them. We understand that in some cases there may be results that are confusing or concerning to you. Please understand that not all results are received at the same time and often the doctors may need to interpret multiple results in order to provide you with the best plan of care or course of treatment. Therefore, we ask that you please give [place] 2 business days to thoroughly review all your results before contacting the office for clarification. Should we see a critical lab result, you will be contacted sooner.   If You Need Anything After Your Visit  If you have any questions or concerns for your doctor, please call our main line at [phone] and press option 4 to reach your doctor's medical assistant. If no one answers, please leave a voicemail as directed and we will return your call as soon as possible. Messages left after 4 pm will be answered the following business day.   You may also send [place] a message via MyChart. We typically respond to MyChart messages within 1-2 business days.  For prescription refills, please ask your pharmacy to contact our  office. Our fax number is [phone].  If you have an urgent issue when the clinic is closed that cannot wait until the next business day, you can page your doctor at the number below.    Please note that while we do our best to be available for urgent issues outside of office hours, we are not available 24/7.   If you have an urgent issue and are unable to reach [place], you may choose to seek medical care at your doctor's office, retail clinic, urgent care center, or emergency room.  If you have a medical emergency, please immediately call 911 or go to the emergency department.  Pager Numbers  - Dr. Gwen Pounds: [phone]  - Dr. Roseanne Reno: [phone]  - Dr. Katrinka Blazing: [phone]   In the event of inclement weather, please call our main line at [phone] for an update on the status of any delays or closures.  Dermatology Medication Tips: Please keep the boxes that topical medications come in in order to help keep track of the instructions about where and how to use these. Pharmacies typically print the medication instructions only on the boxes and not directly on the medication tubes.   If your medication is too expensive, please contact our office at [phone] option 4 or send [place] a message through MyChart.   We are unable to tell what your co-pay for medications will be in advance as this is different depending on your insurance coverage. However, we may be able to find a substitute medication at lower cost or  fill out paperwork to get insurance to cover a needed medication.   If a prior authorization is required to get your medication covered by your insurance company, please allow [place] 1-2 business days to complete this process.  Drug prices often vary depending on where the prescription is filled and some pharmacies may offer cheaper prices.  The website www.goodrx.com contains coupons for medications through different pharmacies. The prices here do not account for what the cost  may be with help from insurance (it may be cheaper with your insurance), but the website can give you the price if you did not use any insurance.  - You can print the associated coupon and take it with your prescription to the pharmacy.  - You may also stop by our office during regular business hours and pick up a GoodRx coupon card.  - If you need your prescription sent electronically to a different pharmacy, notify our office through [facility] or by phone at [phone] option 4.     Si Usted Necesita Algo Despus de Su Visita  Tambin puede enviarnos un mensaje a travs de Clinical cytogeneticist. Por lo general respondemos a los mensajes de MyChart en el transcurso de 1 a 2 das hbiles.  Para renovar recetas, por favor pida a su farmacia que se ponga en contacto con nuestra oficina. Annie Sable de fax es [place] [phone].  Si tiene un asunto urgente cuando la clnica est cerrada y que no puede esperar hasta el siguiente da hbil, puede llamar/localizar a su doctor(a) al nmero que aparece a continuacin.   Por favor, tenga en cuenta que aunque hacemos todo lo posible para estar disponibles para asuntos urgentes fuera del horario de [place], no estamos disponibles las 24 horas del da, los 7 [address]  [address] de la [place].   Si tiene un problema urgente y no puede comunicarse con nosotros, puede optar por buscar atencin mdica  en el consultorio de su doctor(a), en una clnica privada, en un centro de atencin urgente o en una sala de emergencias.  Si tiene Engineer, drilling, por favor llame inmediatamente al 911 o vaya a la sala de emergencias.  Nmeros de bper  - Dr. Gwen Pounds: [phone]  - Dra. Roseanne Reno: [phone]  - Dr. Katrinka Blazing: [phone]   En caso de inclemencias del tiempo, por favor llame a Lacy Duverney principal al [phone] para una actualizacin sobre el [place] de cualquier retraso o cierre.  Consejos para la medicacin en dermatologa: Por favor, guarde las cajas en  las que vienen los medicamentos de uso tpico para ayudarle a seguir las instrucciones sobre dnde y cmo usarlos. Las farmacias generalmente imprimen las instrucciones del medicamento slo en las cajas y no directamente en los tubos del [place].   Si su medicamento es muy caro, por favor, pngase en contacto con Rolm Gala llamando al [phone] y presione la opcin 4 o envenos un mensaje a travs de Clinical cytogeneticist.   No podemos decirle cul ser su copago por los medicamentos por adelantado ya que esto es diferente dependiendo de la cobertura de su seguro. Sin embargo, es posible que podamos encontrar un medicamento sustituto a Audiological scientist un formulario para que el seguro cubra el medicamento que se considera necesario.   Si se requiere una autorizacin previa para que su compaa de seguros [place] su medicamento, por favor permtanos de 1 a 2 das hbiles para completar [address]  Los precios de los medicamentos varan con frecuencia dependiendo del Environmental consultant de dnde se surte la  receta y alguna farmacias pueden ofrecer precios ms baratos.  El sitio web www.goodrx.com tiene cupones para medicamentos de Health and safety inspector. Los precios aqu no tienen en cuenta lo que podra costar con la ayuda del seguro (puede ser ms barato con su seguro), pero el sitio web puede darle el precio si no utiliz Tourist information centre manager.  - Puede imprimir el cupn correspondiente y llevarlo con su receta a la farmacia.  - Tambin puede pasar por nuestra oficina durante el horario de atencin regular y Education officer, museum una tarjeta de cupones de GoodRx.  - Si necesita que su receta se enve electrnicamente a una farmacia diferente, informe a nuestra oficina a travs de MyChart de [place] o por telfono llamando al [phone] y presione la opcin 4.

## 2023-04-19 NOTE — Progress Notes (Signed)
Follow-Up Visit   Subjective  [name] is a 89 y.o. male who presents for the following: Skin Cancer Screening and Full Body Skin Exam  The patient presents for Total-Body Skin Exam (TBSE) for skin cancer screening and mole check. The patient has spots, moles and lesions to be evaluated, some may be new or changing and the patient may have concern these could be cancer. He has several spots to check on the arms, hands, and left lateral thigh. History of SCC of the right medial knee, 10/26/22. He has a rash on the right post hip that came up 1 month ago, felt rough. Not as bothersome now.     The following portions of the chart were reviewed this encounter and updated as appropriate: medications, allergies, medical history  Review of Systems:  No other skin or systemic complaints except as noted in HPI or Assessment and Plan.  Objective  Well appearing patient in no apparent distress; mood and affect are within normal limits.  A full examination was performed including scalp, head, eyes, ears, nose, lips, neck, chest, axillae, abdomen, back, buttocks, bilateral upper extremities, bilateral lower extremities, hands, feet, fingers, toes, fingernails, and toenails. All findings within normal limits unless otherwise noted below.   Relevant physical exam findings are noted in the Assessment and Plan.  Right Hip Linear pink patch at right sacral to right hip/buttocks. face and ears  x 6 (6) Erythematous thin papules/macules with gritty scale.   Assessment & Plan   SKIN CANCER SCREENING PERFORMED TODAY.  ACTINIC DAMAGE - Chronic condition, secondary to cumulative UV/sun exposure - diffuse scaly erythematous macules with underlying dyspigmentation - Recommend daily broad spectrum sunscreen SPF 30+ to sun-exposed areas, reapply every 2 hours as needed.  - Staying in the shade or wearing long sleeves, sun glasses (UVA+UVB protection) and wide brim hats (4-inch brim around the entire  circumference of the hat) are also recommended for sun protection.  - Call for new or changing lesions.  LENTIGINES, SEBORRHEIC KERATOSES, HEMANGIOMAS - Benign normal skin lesions - Benign-appearing - Call for any changes  MELANOCYTIC NEVI - Tan-brown and/or pink-flesh-colored symmetric macules and papules - Benign appearing on exam today - Observation - Call clinic for new or changing moles - Recommend daily use of broad spectrum spf 30+ sunscreen to sun-exposed areas.   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  - Call if any new or changing lesions are noted between office visits   HERPES ZOSTER WITHOUT COMPLICATION Right Hip Discussed viral etiology (herpes zoster) and risk of contagion. Before blisters dry up and heal, shingles rash can cause chickenpox in people who have never had it or have never been vaccinated against it, if they are exposed to the virus.  Keep area covered.  Avoid contact with pregnant women. Shingles can also cause significant pain or unusual skin sensations (post-herpetic neuralgia) which may persist after rash has resolved.  Shingles rash may leave dyspigmented scars on skin once healed.  Patient has already been treated with oral valacyclovir.   For remaining inflammation, we will start TMC 0.1% cream Apply to AA rash BID until improved dsp 80g 0Rf. Avoid applying to face, groin, and axilla. Use as directed. Long-term use can  cause thinning of the skin.  AK (ACTINIC KERATOSIS) (6) face and ears  x 6 (6) Actinic keratoses are precancerous spots that appear secondary to cumulative UV radiation exposure/sun exposure over time. They are chronic with expected duration over 1  year. A portion of actinic keratoses will progress to squamous cell carcinoma of the skin. It is not possible to reliably predict which spots will progress to skin cancer and so treatment is recommended to prevent development of skin cancer.  Recommend daily broad spectrum sunscreen SPF 30+ to sun-exposed areas, reapply every 2 hours as needed.  Recommend staying in the shade or wearing long sleeves, sun glasses (UVA+UVB protection) and wide brim hats (4-inch brim around the entire circumference of the hat). Call for new or changing lesions. Destruction of lesion - face and ears  x 6 (6) Complexity: simple   Destruction method: cryotherapy   Informed consent: discussed and consent obtained   Timeout:  patient name, date of birth, surgical site, and procedure verified Lesion destroyed using liquid nitrogen: Yes   Region frozen until ice ball extended beyond lesion: Yes   Outcome: patient tolerated procedure well with no complications   Post-procedure details: wound care instructions given   INFLAMED SEBORRHEIC KERATOSIS (3) chest x 1, right arm x 1, left thigh x 1 (3) Symptomatic, irritating, patient would like treated. Destruction of lesion - chest x 1, right arm x 1, left thigh x 1 (3) Complexity: simple   Destruction method: cryotherapy   Informed consent: discussed and consent obtained   Timeout:  patient name, date of birth, surgical site, and procedure verified Lesion destroyed using liquid nitrogen: Yes   Region frozen until ice ball extended beyond lesion: Yes   Outcome: patient tolerated procedure well with no complications   Post-procedure details: wound care instructions given   Return 6-8 months, for AKs.  Wendee Beavers, CMA, am acting as scribe for Armida Sans, MD .   Documentation: I have reviewed the above documentation for accuracy and completeness, and I agree with the above.  Armida Sans, MD

## 2023-04-20 DIAGNOSIS — Z961 Presence of intraocular lens: Secondary | ICD-10-CM | POA: Diagnosis not present

## 2023-04-20 DIAGNOSIS — D3131 Benign neoplasm of right choroid: Secondary | ICD-10-CM | POA: Diagnosis not present

## 2023-04-20 DIAGNOSIS — H43813 Vitreous degeneration, bilateral: Secondary | ICD-10-CM | POA: Diagnosis not present

## 2023-04-20 DIAGNOSIS — H16223 Keratoconjunctivitis sicca, not specified as Sjogren's, bilateral: Secondary | ICD-10-CM | POA: Diagnosis not present

## 2023-04-27 ENCOUNTER — Encounter: Payer: Self-pay | Admitting: Dermatology

## 2023-06-12 DIAGNOSIS — M5416 Radiculopathy, lumbar region: Secondary | ICD-10-CM | POA: Diagnosis not present

## 2023-06-12 DIAGNOSIS — M955 Acquired deformity of pelvis: Secondary | ICD-10-CM | POA: Diagnosis not present

## 2023-06-12 DIAGNOSIS — M9905 Segmental and somatic dysfunction of pelvic region: Secondary | ICD-10-CM | POA: Diagnosis not present

## 2023-06-12 DIAGNOSIS — M9903 Segmental and somatic dysfunction of lumbar region: Secondary | ICD-10-CM | POA: Diagnosis not present

## 2023-06-25 DIAGNOSIS — K080 Exfoliation of teeth due to systemic causes: Secondary | ICD-10-CM | POA: Diagnosis not present

## 2023-07-10 DIAGNOSIS — M9903 Segmental and somatic dysfunction of lumbar region: Secondary | ICD-10-CM | POA: Diagnosis not present

## 2023-07-10 DIAGNOSIS — M5416 Radiculopathy, lumbar region: Secondary | ICD-10-CM | POA: Diagnosis not present

## 2023-07-10 DIAGNOSIS — M9905 Segmental and somatic dysfunction of pelvic region: Secondary | ICD-10-CM | POA: Diagnosis not present

## 2023-07-10 DIAGNOSIS — M955 Acquired deformity of pelvis: Secondary | ICD-10-CM | POA: Diagnosis not present

## 2023-07-11 ENCOUNTER — Encounter: Payer: Self-pay | Admitting: Cardiovascular Disease

## 2023-07-11 ENCOUNTER — Ambulatory Visit: Payer: Medicare Other | Attending: Cardiovascular Disease | Admitting: Cardiovascular Disease

## 2023-07-11 VITALS — BP 132/60 | HR 65 | Ht 69.0 in | Wt 203.2 lb

## 2023-07-11 DIAGNOSIS — E785 Hyperlipidemia, unspecified: Secondary | ICD-10-CM

## 2023-07-11 DIAGNOSIS — R001 Bradycardia, unspecified: Secondary | ICD-10-CM

## 2023-07-11 DIAGNOSIS — I10 Essential (primary) hypertension: Secondary | ICD-10-CM | POA: Diagnosis not present

## 2023-07-11 DIAGNOSIS — I4891 Unspecified atrial fibrillation: Secondary | ICD-10-CM

## 2023-07-11 DIAGNOSIS — R079 Chest pain, unspecified: Secondary | ICD-10-CM

## 2023-07-11 DIAGNOSIS — G4733 Obstructive sleep apnea (adult) (pediatric): Secondary | ICD-10-CM

## 2023-07-11 DIAGNOSIS — I639 Cerebral infarction, unspecified: Secondary | ICD-10-CM

## 2023-07-11 DIAGNOSIS — I6523 Occlusion and stenosis of bilateral carotid arteries: Secondary | ICD-10-CM

## 2023-07-11 NOTE — Assessment & Plan Note (Signed)
 Mr. Enslin had an episode of chest pain evaluated in the ER 12/10/22.  The pain radiated to both his upper extremities.  His evaluation was unrevealing.  He had a recurrent episode of chest pain in [place] in November again without significant findings on workup.  He said no recurrent symptoms.  Because of his family history with 2 siblings that have had stents in their 68s as well as risk factors, after discussion with him about his wishes, we have decided to proceed with outpatient Lexiscan Myoview stress testing.

## 2023-07-11 NOTE — Progress Notes (Signed)
 07/11/2023 [name]   12-13-1931  [id number]  Primary Physician Paul Boyden, MD Primary Cardiologist: Paul Gess MD FACP, [place], [place], [place]  HPI:  [name] is a 89 y.o.  moderately overweight married Caucasian male with no children is accompanied by his wife [name] today. He was referred by his PCP, Dr. Sharen Robles, for evaluation of bradycardia.  I last saw him in the office 02/07/2022.  He has no cardiac risk factors other than treated hypertension. He is never smoked [name] stopped drinking 13 years ago. There is a family history of heart disease with both of his younger siblings who have had stents in the early 12s. 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 He is fairly active and denies chest pain or shortness of breath. He does have obstructive sleep apnea on CPAP.  He had an episode of chest pain 12/10/22 and was evaluated emergency room.  The pain radiated to both upper extremities.  His evaluation was unrevealing.  He had a recurrent episode of chest pain in [place] November 2024 with similarly an unrevealing diagnosis.  He said no further symptoms.  He is fairly active, walks 3-4 times a week with his wife for approximately 35 minutes at a time without symptoms.   Current Meds  Medication Sig   amLODipine (NORVASC) 10 MG tablet Take 1 tablet (10 mg total) by mouth daily.   azelastine (ASTELIN) 0.1 % nasal spray Place 1 spray into both nostrils 2 (two) times daily. Use in each nostril as directed   cetirizine (ZYRTEC) 10 MG tablet Take 10 mg by mouth daily. Alternates weekly with allegra.   clopidogrel (PLAVIX) 75 MG tablet Take 1 tablet (75 mg total) by mouth daily.   Coenzyme Q10 100 MG capsule Take 1 capsule (100 mg  total) by mouth daily.   cyanocobalamin (VITAMIN B12) 1000 MCG tablet Take 1 tablet (1,000 mcg total) by mouth daily.   donepezil (ARICEPT) 5 MG tablet Take 1 tablet (5 mg total) by mouth at bedtime.   fexofenadine (ALLEGRA) 180 MG tablet Take 180 mg by mouth daily. Alternates weekly with zyrtec.   GLUCOSAMINE-CHONDROITIN PO Take 2 tablets by mouth daily.   hydrALAZINE (APRESOLINE) 50 MG tablet Take 1 tablet (50 mg total) by mouth 3 (three) times daily.   losartan (COZAAR) 50 MG tablet Take 1 tablet (50 mg total) by mouth daily.   Magnesium 500 MG CAPS Take 1 capsule (500 mg total) by mouth daily.   Multiple Vitamins-Minerals (MULTIVITAMIN ADULT PO) Take 1 tablet by mouth daily.   pantoprazole (PROTONIX) 20 MG tablet Take 1 tablet (20 mg total) by mouth daily.   polyethylene glycol (MIRALAX / GLYCOLAX) packet Take 8.5 g by mouth daily.    pravastatin (PRAVACHOL) 20 MG tablet Take 1 tablet (20 mg total) by mouth 2 (two) times a week.   triamcinolone cream (KENALOG) 0.1 % Apply to affected areas rash on right hip twice daily until improved. Avoid applying to face, groin, and axilla. Use as directed. Long-term use can cause thinning of the skin.   [DISCONTINUED] acetaminophen (TYLENOL) 500 MG tablet Take 1-2 tablets (500-1,000 mg total) by mouth 2 (two) times daily as needed for moderate pain. (Patient taking differently:  Take 500-1,000 mg by mouth 2 (two) times daily as needed for moderate pain (pain score 4-6). Pt states taking 3 tablets)     Allergies  Allergen Reactions   Lisinopril Cough   Atorvastatin Other (See Comments)    Myalgias and mildly elevated CPK     Social History   Socioeconomic History   Marital status: Married    Spouse name: Not on file   Number of children: Not on file   Years of education: Not on file   Highest education level: Bachelor's degree (e.g., BA, AB, BS)  Occupational History   Not on file  Tobacco Use   Smoking status: Never   Smokeless tobacco:  Never  Vaping Use   Vaping status: Never Used  Substance and Sexual Activity   Alcohol use: Yes    Comment: occasional wine   Drug use: No   Sexual activity: Not on file  Other Topics Concern   Not on file  Social History Narrative   "[name]"   Widower. 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   Edu: college chemistry degree   Right handed   Activity: walking 9/10th mile daily    Caffeine: Drinks 1 cup of coffee every morning    Social Drivers of Corporate investment banker Strain: Low Risk  (03/05/2023)   Overall Financial Resource Strain (CARDIA)    Difficulty of Paying Living Expenses: Not hard at all  Food Insecurity: No Food Insecurity (03/05/2023)   Hunger Vital Sign    Worried About Running Out of Food in the Last Year: Never true    Ran Out of Food in the Last Year: Never true  Transportation Needs: No Transportation Needs (03/05/2023)   PRAPARE - Administrator, Civil Service (Medical): No    Lack of Transportation (Non-Medical): No  Physical Activity: Sufficiently Active (03/05/2023)   Exercise Vital Sign    Days of Exercise per Week: 5 days    Minutes of Exercise per Session: 30 min  Stress: No Stress Concern Present (03/05/2023)   [name] of Occupational Health - Occupational Stress Questionnaire    Feeling of Stress : Not at all  Social Connections: Moderately Integrated (03/05/2023)   Social Connection and Isolation Panel [NHANES]    Frequency of Communication with Friends and Family: More than three times a week    Frequency of Social Gatherings with Friends and Family: More than three times a week    Attends Religious Services: More than 4 times per year    Active Member of [employer] or Organizations: No    Attends Banker Meetings: Never    Marital Status: Married  Catering manager Violence: Not At Risk (03/05/2023)   Humiliation, Afraid, Rape, and Kick questionnaire    Fear  of Current or Ex-Partner: No    Emotionally Abused: No    Physically Abused: No    Sexually Abused: No     Review of Systems: General: negative for chills, fever, night sweats or weight changes.  Cardiovascular: negative for chest pain, dyspnea on exertion, edema, orthopnea, palpitations, paroxysmal nocturnal dyspnea or shortness of breath Dermatological: negative for rash Respiratory: negative for cough or wheezing Urologic: negative for hematuria Abdominal: negative for nausea, vomiting, diarrhea, bright red blood per rectum, melena, or hematemesis Neurologic: negative for visual changes, syncope, or dizziness All other systems reviewed and are otherwise negative except as noted above.    Blood pressure 132/60, pulse  65, height 5\' 9"  (1.753 m), weight 203 lb 3.2 oz (92.2 kg), SpO2 96%.  General appearance: alert and no distress Neck: no adenopathy, no JVD, supple, symmetrical, trachea midline, thyroid not enlarged, symmetric, no tenderness/mass/nodules, and loud right carotid bruit Lungs: clear to auscultation bilaterally Heart: regular rate and rhythm, S1, S2 normal, no murmur, click, rub or gallop Extremities: extremities normal, atraumatic, no cyanosis or edema Pulses: 2+ and symmetric Skin: Skin color, texture, turgor normal. No rashes or lesions Neurologic: Grossly normal  EKG EKG Interpretation Date/Time:  Wednesday July 11 2023 15:23:35 EST Ventricular Rate:  66 PR Interval:  218 QRS Duration:  96 QT Interval:  434 QTC Calculation: 454 R Axis:   21  Text Interpretation: Sinus rhythm with 1st degree A-V block with Premature atrial complexes When compared with ECG of 02-Jan-2023 15:41, Premature atrial complexes are now Present Confirmed by Nanetta Batty [phone]) on 07/11/2023 3:29:41 PM    ASSESSMENT AND PLAN:   HTN (hypertension) History of essential hypertension with blood pressure measured today at 132/60.  He is on amlodipine, hydralazine and losartan.  Because  of first-degree AV block we are avoiding beta-blockers.  Dyslipidemia History of dyslipidemia on Pravachol with lipid profile performed 03/27/2023 revealing total cholesterol 142, LDL 76 and HDL 39.  Chest pain of uncertain etiology Mr. Dray had an episode of chest pain evaluated in the ER 12/10/22.  The pain radiated to both his upper extremities.  His evaluation was unrevealing.  He had a recurrent episode of chest pain in [place] in November again without significant findings on workup.  He said no recurrent symptoms.  Because of his family history with 2 siblings that have had stents in their 81s as well as risk factors, after discussion with him about his wishes, we have decided to proceed with outpatient Lexiscan Myoview stress testing.     Paul Gess MD FACP,FACC,FAHA, [facility] 07/11/2023 3:44 PM

## 2023-07-11 NOTE — Assessment & Plan Note (Signed)
 History of essential hypertension with blood pressure measured today at 132/60.  He is on amlodipine, hydralazine and losartan.  Because of first-degree AV block we are avoiding beta-blockers.

## 2023-07-11 NOTE — Patient Instructions (Signed)
 Medication Instructions:  Your physician recommends that you continue on your current medications as directed. Please refer to the Current Medication list given to you today.  *If you need a refill on your cardiac medications before your next appointment, please call your pharmacy*   Testing/Procedures: Your physician has requested that you have a carotid duplex. This test is an ultrasound of the carotid arteries in your neck. It looks at blood flow through these arteries that supply the brain with blood. Allow one hour for this exam. There are no restrictions or special instructions. This will take place at [address] [facility], [address].  Please note: We ask at that you not bring children with you during ultrasound (echo/ vascular) testing. Due to room size and safety concerns, children are not allowed in the ultrasound rooms during exams. Our front office staff cannot provide observation of children in our lobby area while testing is being conducted. An adult accompanying a patient to their appointment will only be allowed in the ultrasound room at the discretion of the ultrasound technician under special circumstances. We apologize for any inconvenience.   Dr. Allyson Sabal has ordered a Lexiscan Myocardial Perfusion Imaging Study.  Please arrive 15 minutes prior to your appointment time for registration and insurance purposes.   The test will take approximately 3 to 4 hours to complete; you may bring reading material.  If someone comes with you to your appointment, they will need to remain in the main lobby due to limited space in the testing area.   How to prepare for your Myocardial Perfusion Test: Do not eat or drink 3 hours prior to your test, except you may have water. Do not consume products containing caffeine (regular or decaffeinated) 12 hours prior to your test. (ex: coffee, chocolate, sodas, tea). Do wear comfortable clothes (no dresses or overalls) and walking shoes, tennis shoes  preferred (No heels or open toe shoes are allowed). Do NOT wear cologne, perfume, aftershave, or lotions (deodorant is allowed). If you use an inhaler, use it the AM of your test and bring it with you.  If you use a nebulizer, use it the AM of your test.  If these instructions are not followed, your test will have to be rescheduled.    Follow-Up: At [facility], you and your health needs are our priority.  As part of our continuing mission to provide you with exceptional heart care, we have created designated Provider Care Teams.  These Care Teams include your primary Cardiologist (physician) and Advanced Practice Providers (APPs -  Physician Assistants and Nurse Practitioners) who all work together to provide you with the care you need, when you need it.  We recommend signing up for the patient portal called "MyChart".  Sign up information is provided on this After Visit Summary.  MyChart is used to connect with patients for Virtual Visits (Telemedicine).  Patients are able to view lab/test results, encounter notes, upcoming appointments, etc.  Non-urgent messages can be sent to your provider as well.   To learn more about what you can do with MyChart, go to [email or web address].    Your next appointment:   6 month(s)  Provider:   Joni Reining, DNP, ANP      Then, Nanetta Batty, MD will plan to see you again in 12 month(s).    Other Instructions   1st Floor: - Lobby - Registration  - Pharmacy  - Lab - Cafe  2nd Floor: - PV Lab - Diagnostic Testing (echo, CT,  nuclear med)  3rd Floor: - Vacant  4th Floor: - TCTS (cardiothoracic surgery) - AFib Clinic - Structural Heart Clinic - Vascular Surgery  - Vascular Ultrasound  5th Floor: - [facility] (general and EP) - Clinical Pharmacy for coumadin, hypertension, lipid, weight-loss medications, and med management appointments    Valet parking services will be available as well.

## 2023-07-11 NOTE — Assessment & Plan Note (Signed)
 History of dyslipidemia on Pravachol with lipid profile performed 03/27/2023 revealing total cholesterol 142, LDL 76 and HDL 39.

## 2023-07-13 ENCOUNTER — Encounter (HOSPITAL_COMMUNITY): Payer: Self-pay

## 2023-07-13 ENCOUNTER — Ambulatory Visit (INDEPENDENT_AMBULATORY_CARE_PROVIDER_SITE_OTHER): Payer: Medicare Other | Admitting: Family Medicine

## 2023-07-13 ENCOUNTER — Encounter: Payer: Self-pay | Admitting: Family Medicine

## 2023-07-13 VITALS — BP 136/66 | HR 60 | Temp 97.9°F | Ht 69.0 in | Wt 205.4 lb

## 2023-07-13 DIAGNOSIS — G3184 Mild cognitive impairment, so stated: Secondary | ICD-10-CM | POA: Diagnosis not present

## 2023-07-13 DIAGNOSIS — E785 Hyperlipidemia, unspecified: Secondary | ICD-10-CM | POA: Diagnosis not present

## 2023-07-13 DIAGNOSIS — E66811 Obesity, class 1: Secondary | ICD-10-CM

## 2023-07-13 DIAGNOSIS — I10 Essential (primary) hypertension: Secondary | ICD-10-CM

## 2023-07-13 NOTE — Progress Notes (Signed)
 Ph: [phone] Fax: [phone]   Patient ID: [name], male    DOB: [birth year], 89 y.o.   MRN: [id number]  This visit was conducted in person.  BP 136/66   Pulse 60   Temp 97.9 F (36.6 C) (Oral)   Ht 5\' 9"  (1.753 m)   Wt 205 lb 6 oz (93.2 kg)   SpO2 95%   BMI 30.33 kg/m    CC: 3 mo f/u visit  Subjective:   HPI: [name] is a 89 y.o. male presenting on 07/13/2023 for Medical Management of Chronic Issues (Here for 3 mo f/u.)   HTN - Compliant with current antihypertensive regimen of amlodipine 10mg  daily, hydralazine 50mg  TID, losartan 50mg  daily. Does check blood pressures at home: well controlled. No low blood pressure readings or symptoms of dizziness/syncope. Denies HA, vision changes, CP/tightness, SOB, leg swelling.   Not on BB due to bradycardia and 1st degree AV block.    13 lb weight loss over the past several months - attributes to staying active at work [employer]) and cutting out carbohydrates in diet. He lost his job 3 wks ago - he is not upset about this. He continues walking regularly.   Saw dermatology 04/2023 Gwen Pounds)   Saw cardiology Dr Allyson Sabal 07/2023 - upcoming lexiscan stress test and carotid US in the next month.      Relevant past medical, surgical, family and social history reviewed and updated as indicated. Interim medical history since our last visit reviewed. Allergies and medications reviewed and updated. Outpatient Medications Prior to Visit  Medication Sig Dispense Refill   Acetaminophen Extra Strength 500 MG CAPS Take by mouth daily as needed. OTC     amLODipine (NORVASC) 10 MG tablet Take 1 tablet (10 mg total) by mouth daily. 90 tablet 4   azelastine (ASTELIN) 0.1 % nasal spray Place 1 spray into both nostrils 2 (two) times daily. Use in each nostril as directed 30 mL 12   cetirizine (ZYRTEC) 10 MG tablet Take 10 mg by mouth daily. Alternates weekly with allegra. OTC     clopidogrel (PLAVIX) 75 MG tablet Take 1  tablet (75 mg total) by mouth daily. 90 tablet 4   Coenzyme Q10-Vitamin E (QUNOL ULTRA COQ10 PO) Take 1 capsule by mouth daily. OTC     cyanocobalamin (VITAMIN B12) 1000 MCG tablet Take 1 tablet (1,000 mcg total) by mouth daily. (Patient taking differently: Take 1,000 mcg by mouth daily. OTC)     donepezil (ARICEPT) 5 MG tablet Take 1 tablet (5 mg total) by mouth at bedtime. 90 tablet 4   fexofenadine (ALLEGRA) 180 MG tablet Take 180 mg by mouth daily. Alternates weekly with zyrtec. OTC     hydrALAZINE (APRESOLINE) 50 MG tablet Take 1 tablet (50 mg total) by mouth 3 (three) times daily. 270 tablet 4   losartan (COZAAR) 50 MG tablet Take 1 tablet (50 mg total) by mouth daily. 90 tablet 4   Magnesium 400 MG TABS Take 1 tablet by mouth daily. OTC     Misc Natural Products (OSTEO BI-FLEX TRIPLE STRENGTH) TABS Take 1 tablet by mouth daily. + vitamin D. OTC     Multiple Vitamins-Minerals (ONE-A-DAY MENS 50+) TABS Take 1 tablet by mouth daily. OTC     pantoprazole (PROTONIX) 20 MG tablet Take 1 tablet (20 mg total) by mouth daily. 90 tablet 4   pravastatin (PRAVACHOL) 20 MG tablet Take 1 tablet (20 mg total) by mouth 2 (two) times a week.  24 tablet 4   polyethylene glycol (MIRALAX / GLYCOLAX) packet Take 8.5 g by mouth daily.      triamcinolone cream (KENALOG) 0.1 % Apply to affected areas rash on right hip twice daily until improved. Avoid applying to face, groin, and axilla. Use as directed. Long-term use can cause thinning of the skin. 80 g 0   Coenzyme Q10 100 MG capsule Take 1 capsule (100 mg total) by mouth daily.     GLUCOSAMINE-CHONDROITIN PO Take 2 tablets by mouth daily.     Magnesium 500 MG CAPS Take 1 capsule (500 mg total) by mouth daily.     Multiple Vitamins-Minerals (MULTIVITAMIN ADULT PO) Take 1 tablet by mouth daily.     No facility-administered medications prior to visit.     Per HPI unless specifically indicated in ROS section below Review of Systems  Objective:  BP 136/66    Pulse 60   Temp 97.9 F (36.6 C) (Oral)   Ht 5\' 9"  (1.753 m)   Wt 205 lb 6 oz (93.2 kg)   SpO2 95%   BMI 30.33 kg/m   Wt Readings from Last 3 Encounters:  07/13/23 205 lb 6 oz (93.2 kg)  07/11/23 203 lb 3.2 oz (92.2 kg)  04/13/23 212 lb 4 oz (96.3 kg)      Physical Exam Vitals and nursing note reviewed.  Constitutional:      Appearance: Normal appearance. He is not ill-appearing.  HENT:     Mouth/Throat:     Mouth: Mucous membranes are moist.     Pharynx: Oropharynx is clear. No oropharyngeal exudate or posterior oropharyngeal erythema.  Eyes:     Extraocular Movements: Extraocular movements intact.     Conjunctiva/sclera: Conjunctivae normal.     Pupils: Pupils are equal, round, and reactive to light.  Cardiovascular:     Rate and Rhythm: Normal rate and regular rhythm.     Pulses: Normal pulses.     Heart sounds: Normal heart sounds. No murmur heard. Pulmonary:     Effort: Pulmonary effort is normal. No respiratory distress.     Breath sounds: Normal breath sounds. No wheezing, rhonchi or rales.  Musculoskeletal:     Right lower leg: No edema.     Left lower leg: No edema.  Skin:    General: Skin is warm and dry.     Findings: No rash.  Neurological:     Mental Status: He is alert.  Psychiatric:        Mood and Affect: Mood normal.        Behavior: Behavior normal.        Assessment & Plan:   Problem List Items Addressed This Visit     HTN (hypertension) - Primary   Chronic, great control on current regimen - continue      Obesity, Class I, BMI 30.0-34.9 (see actual BMI)   Discussed recent weight loss - which he attributes to staying active.  Will continue to monitor.       Dyslipidemia   Chronic, stable on pravastatin twice weekly. Atorvastatin previously caused bump in CPK.  He received letter from insurance regarding pravastatin coverage issue. No further information offered in letter - I've asked him to contact his insurance to find out what is  the issue, and see if there are preferred covered  alternatives and to let me know what he finds out.       MCI (mild cognitive impairment)   Notes ongoing trouble with this - continue low  dose aricept, titration previously limited by bradycardia.  Consider namenda.  Consider MMSE next visit.         No orders of the defined types were placed in this encounter.   No orders of the defined types were placed in this encounter.   Patient Instructions  Call insurance to ask about why they're not covering pravastatin anymore - see if we need to submit any additional information or what are covered alternatives to this medicine.  Continue current medicines.  Return in 3-4 months for follow up visit and memory testing  Follow up plan: Return in about 3 months (around 10/13/2023) for follow up visit.  Eustaquio Boyden, MD

## 2023-07-13 NOTE — Patient Instructions (Addendum)
 Call insurance to ask about why they're not covering pravastatin anymore - see if we need to submit any additional information or what are covered alternatives to this medicine.  Continue current medicines.  Return in 3-4 months for follow up visit and memory testing

## 2023-07-14 NOTE — Assessment & Plan Note (Signed)
Chronic, great control on current regimen - continue.

## 2023-07-14 NOTE — Assessment & Plan Note (Signed)
 Notes ongoing trouble with this - continue low dose aricept, titration previously limited by bradycardia.  Consider namenda.  Consider MMSE next visit.

## 2023-07-14 NOTE — Assessment & Plan Note (Signed)
 Discussed recent weight loss - which he attributes to staying active.  Will continue to monitor.

## 2023-07-14 NOTE — Assessment & Plan Note (Addendum)
 Chronic, stable on pravastatin twice weekly. Atorvastatin previously caused bump in CPK.  He received letter from insurance regarding pravastatin coverage issue. No further information offered in letter - I've asked him to contact his insurance to find out what is the issue, and see if there are preferred covered  alternatives and to let me know what he finds out.

## 2023-07-19 ENCOUNTER — Telehealth (HOSPITAL_COMMUNITY): Payer: Self-pay | Admitting: *Deleted

## 2023-07-19 NOTE — Telephone Encounter (Signed)
 Left a detailed message for patient's wife on her voice mail per DPR regarding her husband's STRESS TEST for 07/20/23.

## 2023-07-20 ENCOUNTER — Ambulatory Visit (HOSPITAL_COMMUNITY): Attending: Cardiovascular Disease

## 2023-07-20 DIAGNOSIS — E785 Hyperlipidemia, unspecified: Secondary | ICD-10-CM | POA: Insufficient documentation

## 2023-07-20 DIAGNOSIS — R079 Chest pain, unspecified: Secondary | ICD-10-CM | POA: Insufficient documentation

## 2023-07-20 LAB — MYOCARDIAL PERFUSION IMAGING
LV dias vol: 113 mL (ref 62–150)
LV sys vol: 43 mL
Nuc Stress EF: 62 %
Peak HR: 75 {beats}/min
Rest HR: 62 {beats}/min
Rest Nuclear Isotope Dose: 10.9 mCi
SDS: 1
SRS: 0
SSS: 1
ST Depression (mm): 0 mm
Stress Nuclear Isotope Dose: 30.9 mCi
TID: 1.04

## 2023-07-20 MED ORDER — TECHNETIUM TC 99M TETROFOSMIN IV KIT
10.8000 | PACK | Freq: Once | INTRAVENOUS | Status: DC | PRN
Start: 2023-07-20 — End: 2023-07-20

## 2023-07-20 MED ORDER — TECHNETIUM TC 99M TETROFOSMIN IV KIT: 31.5000 | PACK | Freq: Once | INTRAVENOUS | Status: DC | PRN

## 2023-07-20 MED ORDER — TECHNETIUM TC 99M TETROFOSMIN IV KIT
30.9000 | PACK | Freq: Once | INTRAVENOUS | Status: AC | PRN
Start: 2023-07-20 — End: 2023-07-20
  Administered 2023-07-20: 30.9 via INTRAVENOUS

## 2023-07-20 MED ORDER — TECHNETIUM TC 99M TETROFOSMIN IV KIT
10.9000 | PACK | Freq: Once | INTRAVENOUS | Status: AC | PRN
  Administered 2023-07-20: 10.9 via INTRAVENOUS

## 2023-07-20 MED ORDER — REGADENOSON 0.4 MG/5ML IV SOLN
0.4000 mg | Freq: Once | INTRAVENOUS | Status: AC
  Administered 2023-07-20: 0.4 mg via INTRAVENOUS

## 2023-07-30 DIAGNOSIS — M955 Acquired deformity of pelvis: Secondary | ICD-10-CM | POA: Diagnosis not present

## 2023-07-30 DIAGNOSIS — M9905 Segmental and somatic dysfunction of pelvic region: Secondary | ICD-10-CM | POA: Diagnosis not present

## 2023-07-30 DIAGNOSIS — M9903 Segmental and somatic dysfunction of lumbar region: Secondary | ICD-10-CM | POA: Diagnosis not present

## 2023-07-30 DIAGNOSIS — M5416 Radiculopathy, lumbar region: Secondary | ICD-10-CM | POA: Diagnosis not present

## 2023-08-09 ENCOUNTER — Ambulatory Visit (HOSPITAL_COMMUNITY)
Admission: RE | Admit: 2023-08-09 | Discharge: 2023-08-09 | Disposition: A | Discharge status: Home / Self Care | Source: Ambulatory Visit | Attending: Internal Medicine | Admitting: Internal Medicine

## 2023-08-09 DIAGNOSIS — I6523 Occlusion and stenosis of bilateral carotid arteries: Secondary | ICD-10-CM

## 2023-08-09 DIAGNOSIS — I10 Essential (primary) hypertension: Secondary | ICD-10-CM

## 2023-08-20 DIAGNOSIS — M9905 Segmental and somatic dysfunction of pelvic region: Secondary | ICD-10-CM | POA: Diagnosis not present

## 2023-08-20 DIAGNOSIS — M5416 Radiculopathy, lumbar region: Secondary | ICD-10-CM | POA: Diagnosis not present

## 2023-08-20 DIAGNOSIS — M9903 Segmental and somatic dysfunction of lumbar region: Secondary | ICD-10-CM | POA: Diagnosis not present

## 2023-08-20 DIAGNOSIS — M955 Acquired deformity of pelvis: Secondary | ICD-10-CM | POA: Diagnosis not present

## 2023-09-20 ENCOUNTER — Other Ambulatory Visit: Payer: Self-pay | Admitting: Family Medicine

## 2023-09-20 DIAGNOSIS — E785 Hyperlipidemia, unspecified: Secondary | ICD-10-CM

## 2023-09-21 ENCOUNTER — Encounter: Payer: Self-pay | Admitting: Medical Oncology

## 2023-09-21 ENCOUNTER — Emergency Department

## 2023-09-21 ENCOUNTER — Observation Stay
Admission: EM | Admit: 2023-09-21 | Discharge: 2023-09-22 | Disposition: A | Discharge status: Home / Self Care | Attending: Internal Medicine | Admitting: Internal Medicine

## 2023-09-21 ENCOUNTER — Other Ambulatory Visit: Payer: Self-pay

## 2023-09-21 ENCOUNTER — Ambulatory Visit: Payer: Self-pay

## 2023-09-21 ENCOUNTER — Observation Stay (HOSPITAL_BASED_OUTPATIENT_CLINIC_OR_DEPARTMENT_OTHER)
Admit: 2023-09-21 | Discharge: 2023-09-21 | Disposition: A | Discharge status: Home / Self Care | Attending: Internal Medicine | Admitting: Internal Medicine

## 2023-09-21 DIAGNOSIS — R2990 Unspecified symptoms and signs involving the nervous system: Secondary | ICD-10-CM | POA: Diagnosis not present

## 2023-09-21 DIAGNOSIS — I6523 Occlusion and stenosis of bilateral carotid arteries: Secondary | ICD-10-CM | POA: Diagnosis not present

## 2023-09-21 DIAGNOSIS — Z85828 Personal history of other malignant neoplasm of skin: Secondary | ICD-10-CM | POA: Insufficient documentation

## 2023-09-21 DIAGNOSIS — R42 Dizziness and giddiness: Principal | ICD-10-CM

## 2023-09-21 DIAGNOSIS — Z7902 Long term (current) use of antithrombotics/antiplatelets: Secondary | ICD-10-CM | POA: Insufficient documentation

## 2023-09-21 DIAGNOSIS — Z8673 Personal history of transient ischemic attack (TIA), and cerebral infarction without residual deficits: Secondary | ICD-10-CM

## 2023-09-21 DIAGNOSIS — G459 Transient cerebral ischemic attack, unspecified: Secondary | ICD-10-CM | POA: Diagnosis present

## 2023-09-21 DIAGNOSIS — Z79899 Other long term (current) drug therapy: Secondary | ICD-10-CM | POA: Insufficient documentation

## 2023-09-21 DIAGNOSIS — E785 Hyperlipidemia, unspecified: Secondary | ICD-10-CM | POA: Diagnosis present

## 2023-09-21 DIAGNOSIS — G4733 Obstructive sleep apnea (adult) (pediatric): Secondary | ICD-10-CM | POA: Diagnosis not present

## 2023-09-21 DIAGNOSIS — K219 Gastro-esophageal reflux disease without esophagitis: Secondary | ICD-10-CM | POA: Diagnosis present

## 2023-09-21 DIAGNOSIS — I63549 Cerebral infarction due to unspecified occlusion or stenosis of unspecified cerebellar artery: Secondary | ICD-10-CM | POA: Diagnosis not present

## 2023-09-21 DIAGNOSIS — R930 Abnormal findings on diagnostic imaging of skull and head, not elsewhere classified: Secondary | ICD-10-CM | POA: Diagnosis not present

## 2023-09-21 DIAGNOSIS — I10 Essential (primary) hypertension: Secondary | ICD-10-CM

## 2023-09-21 DIAGNOSIS — G3184 Mild cognitive impairment, so stated: Secondary | ICD-10-CM | POA: Diagnosis present

## 2023-09-21 DIAGNOSIS — I6503 Occlusion and stenosis of bilateral vertebral arteries: Secondary | ICD-10-CM | POA: Diagnosis not present

## 2023-09-21 DIAGNOSIS — I672 Cerebral atherosclerosis: Secondary | ICD-10-CM | POA: Diagnosis not present

## 2023-09-21 DIAGNOSIS — I639 Cerebral infarction, unspecified: Secondary | ICD-10-CM | POA: Diagnosis not present

## 2023-09-21 DIAGNOSIS — R29818 Other symptoms and signs involving the nervous system: Principal | ICD-10-CM | POA: Insufficient documentation

## 2023-09-21 DIAGNOSIS — Z7901 Long term (current) use of anticoagulants: Secondary | ICD-10-CM | POA: Diagnosis not present

## 2023-09-21 LAB — DIFFERENTIAL
Abs Immature Granulocytes: 0.02 10*3/uL (ref 0.00–0.07)
Basophils Absolute: 0.1 10*3/uL (ref 0.0–0.1)
Basophils Relative: 1 %
Eosinophils Absolute: 0.1 10*3/uL (ref 0.0–0.5)
Eosinophils Relative: 2 %
Immature Granulocytes: 0 %
Lymphocytes Relative: 20 %
Lymphs Abs: 1.1 10*3/uL (ref 0.7–4.0)
Monocytes Absolute: 0.5 10*3/uL (ref 0.1–1.0)
Monocytes Relative: 9 %
Neutro Abs: 3.8 10*3/uL (ref 1.7–7.7)
Neutrophils Relative %: 68 %

## 2023-09-21 LAB — COMPREHENSIVE METABOLIC PANEL WITH GFR
ALT: 21 U/L (ref 0–44)
AST: 27 U/L (ref 15–41)
Albumin: 3.9 g/dL (ref 3.5–5.0)
Alkaline Phosphatase: 42 U/L (ref 38–126)
Anion gap: 8 (ref 5–15)
BUN: 23 mg/dL (ref 8–23)
CO2: 25 mmol/L (ref 22–32)
Calcium: 8.6 mg/dL — ABNORMAL LOW (ref 8.9–10.3)
Chloride: 104 mmol/L (ref 98–111)
Creatinine, Ser: 1.09 mg/dL (ref 0.61–1.24)
GFR, Estimated: >60 mL/min (ref >60)
Glucose, Bld: 98 mg/dL (ref 70–99)
Potassium: 4.3 mmol/L (ref 3.5–5.1)
Sodium: 137 mmol/L (ref 135–145)
Total Bilirubin: 0.8 mg/dL (ref 0.0–1.2)
Total Protein: 6.7 g/dL (ref 6.5–8.1)

## 2023-09-21 LAB — ETHANOL: Alcohol, Ethyl (B): <15 mg/dL (ref <15)

## 2023-09-21 LAB — ECHOCARDIOGRAM COMPLETE
AR max vel: 3.91 cm2
AV Peak grad: 5.6 mmHg
Ao pk vel: 1.18 m/s
Area-P 1/2: 2.83 cm2
Height: 69 in
S' Lateral: 3.5 cm
Weight: 3245.17 [oz_av]

## 2023-09-21 LAB — CBC
HCT: 36.4 % — ABNORMAL LOW (ref 39.0–52.0)
Hemoglobin: 12.3 g/dL — ABNORMAL LOW (ref 13.0–17.0)
MCH: 32.4 pg (ref 26.0–34.0)
MCHC: 33.8 g/dL (ref 30.0–36.0)
MCV: 95.8 fL (ref 80.0–100.0)
Platelets: 250 10*3/uL (ref 150–400)
RBC: 3.8 MIL/uL — ABNORMAL LOW (ref 4.22–5.81)
RDW: 14.3 % (ref 11.5–15.5)
WBC: 5.6 10*3/uL (ref 4.0–10.5)
nRBC: 0.4 % — ABNORMAL HIGH (ref 0.0–0.2)

## 2023-09-21 LAB — PROTIME-INR
INR: 1.2 (ref 0.8–1.2)
Prothrombin Time: 15.2 s (ref 11.4–15.2)

## 2023-09-21 LAB — CBG MONITORING, ED: Glucose-Capillary: 109 mg/dL — ABNORMAL HIGH (ref 70–99)

## 2023-09-21 LAB — APTT: aPTT: 33 s (ref 24–36)

## 2023-09-21 MED ORDER — LORATADINE 10 MG PO TABS: 10.0000 mg | ORAL_TABLET | Freq: Every day | ORAL | Status: DC | PRN

## 2023-09-21 MED ORDER — ACETAMINOPHEN 650 MG RE SUPP: 650.0000 mg | RECTAL | Status: DC | PRN

## 2023-09-21 MED ORDER — MAGNESIUM OXIDE -MG SUPPLEMENT 400 (240 MG) MG PO TABS
400.0000 mg | ORAL_TABLET | Freq: Every day | ORAL | Status: DC
  Administered 2023-09-22: 400 mg via ORAL
  Filled 2023-09-21: qty 1

## 2023-09-21 MED ORDER — ACETAMINOPHEN 325 MG PO TABS: 650.0000 mg | ORAL_TABLET | ORAL | Status: DC | PRN

## 2023-09-21 MED ORDER — CLOPIDOGREL BISULFATE 75 MG PO TABS
75.0000 mg | ORAL_TABLET | Freq: Every day | ORAL | Status: DC
  Administered 2023-09-22: 75 mg via ORAL
  Filled 2023-09-21: qty 1

## 2023-09-21 MED ORDER — HYDRALAZINE HCL 20 MG/ML IJ SOLN: 5.0000 mg | Freq: Four times a day (QID) | INTRAMUSCULAR | Status: AC | PRN

## 2023-09-21 MED ORDER — ADULT MULTIVITAMIN W/MINERALS CH
1.0000 | ORAL_TABLET | Freq: Every day | ORAL | Status: DC
  Administered 2023-09-22: 1 via ORAL
  Filled 2023-09-21: qty 1

## 2023-09-21 MED ORDER — IOHEXOL 350 MG/ML SOLN
75.0000 mL | Freq: Once | INTRAVENOUS | Status: AC | PRN
  Administered 2023-09-21: 75 mL via INTRAVENOUS

## 2023-09-21 MED ORDER — SENNOSIDES-DOCUSATE SODIUM 8.6-50 MG PO TABS: 1.0000 | ORAL_TABLET | Freq: Every evening | ORAL | Status: DC | PRN

## 2023-09-21 MED ORDER — ASPIRIN 81 MG PO TBEC
81.0000 mg | DELAYED_RELEASE_TABLET | Freq: Every day | ORAL | Status: DC
  Administered 2023-09-21 – 2023-09-22 (×2): 81 mg via ORAL
  Filled 2023-09-21 (×2): qty 1

## 2023-09-21 MED ORDER — SODIUM CHLORIDE 0.9 % IV SOLN: INTRAVENOUS | Status: AC

## 2023-09-21 MED ORDER — POLYETHYLENE GLYCOL 3350 17 G PO PACK: 8.5000 g | PACK | Freq: Every day | ORAL | Status: DC | PRN

## 2023-09-21 MED ORDER — SODIUM CHLORIDE 0.9% FLUSH
3.0000 mL | Freq: Once | INTRAVENOUS | Status: AC
  Administered 2023-09-21: 3 mL via INTRAVENOUS

## 2023-09-21 MED ORDER — STROKE: EARLY STAGES OF RECOVERY BOOK: Freq: Once | Status: DC

## 2023-09-21 MED ORDER — DONEPEZIL HCL 5 MG PO TABS
5.0000 mg | ORAL_TABLET | Freq: Every day | ORAL | Status: DC
  Administered 2023-09-21: 5 mg via ORAL
  Filled 2023-09-21: qty 1

## 2023-09-21 MED ORDER — VITAMIN B-12 1000 MCG PO TABS
1000.0000 ug | ORAL_TABLET | Freq: Every day | ORAL | Status: DC
  Administered 2023-09-22: 1000 ug via ORAL
  Filled 2023-09-21: qty 1

## 2023-09-21 MED ORDER — ACETAMINOPHEN 160 MG/5ML PO SOLN: 650.0000 mg | ORAL | Status: DC | PRN

## 2023-09-21 MED ORDER — PRAVASTATIN SODIUM 20 MG PO TABS: 20.0000 mg | ORAL_TABLET | ORAL | Status: DC

## 2023-09-21 MED ORDER — AZELASTINE HCL 0.1 % NA SOLN
1.0000 | Freq: Two times a day (BID) | NASAL | Status: DC
  Administered 2023-09-22: 1 via NASAL
  Filled 2023-09-21: qty 30

## 2023-09-21 MED ORDER — PANTOPRAZOLE SODIUM 20 MG PO TBEC
20.0000 mg | DELAYED_RELEASE_TABLET | Freq: Every day | ORAL | Status: DC
  Administered 2023-09-22: 20 mg via ORAL
  Filled 2023-09-21: qty 1

## 2023-09-21 MED ORDER — HYDRALAZINE HCL 20 MG/ML IJ SOLN
5.0000 mg | Freq: Four times a day (QID) | INTRAMUSCULAR | Status: DC | PRN
Start: 2023-09-21 — End: 2023-09-21

## 2023-09-21 NOTE — Assessment & Plan Note (Signed)
 Home PPI resumed

## 2023-09-21 NOTE — Telephone Encounter (Signed)
Will await ER report.  Thanks.  

## 2023-09-21 NOTE — ED Notes (Signed)
 Neuro at bedside.

## 2023-09-21 NOTE — ED Notes (Signed)
 Pt currently in CT and will be taken to MRI from CT.

## 2023-09-21 NOTE — Assessment & Plan Note (Signed)
 Hydralazine  5 mg IV every 6 hours as needed for SBP greater 185, 1 day ordered

## 2023-09-21 NOTE — Hospital Course (Addendum)
 [name] is an 89 year old male with history of hypertension, history of CVA, mild cognitive decline, GERD, anemia, who presents emergency department for chief concerns of dizziness and inability to walk.  Vitals in the ED showed temperature of 97.5, respiration of 18, heart rate 69, blood pressure 143/77, SpO2 96% on room air.  Serum sodium is 137, potassium 4.3, chloride 104, bicarb 25, BUN of 23, serum creatinine 1.09, EGFR greater than 60, nonfasting glucose 98, WBC 5.6, hemoglobin 12.3, platelets of 250  Code stroke was called initially but CT head and MRI brain was negative for any acute infarct.  Did show progressive chronic small vessel infarcts in the left more than right cerebellum. CTA head and neck with chronic critical atherosclerotic stenosis of vertebrobasilar junction with new occlusion of the distal right vertebral artery since the 2023 CTA.  Chronic occlusion of proximal left vertebral artery but subtle reconstitution.  Evidence of progressed underlying basilar atherosclerosis and stenosis.  Also noted severe bilateral ICA significant stenosis.  62% stenosis of right ICA and 50% stenosis of left ICA.  5/17: Vital stable with mildly elevated blood pressure at 152/57.  Echocardiogram with normal EF and grade 1 diastolic dysfunction, no other significant abnormality except borderline dilatation of ascending aorta.  Lipid profile with HDL of 39 and LDL of 63.  Neurology recommended adding aspirin  to Plavix  and outpatient follow-up with neurology.  Patient to have significant atherosclerosis but surgical intervention with increased risk which outweighs the benefit at this advanced age.  Patient should avoid hypotension to decrease the risk of brain hypoperfusion.  PT recommended outpatient physical therapy.  Patient will continue on current medications and need to have a close follow-up with his providers for further assistance.

## 2023-09-21 NOTE — Telephone Encounter (Signed)
 Too soon. Rx sent 04/16/23, #24/4 refills to [facility].   Request denied.

## 2023-09-21 NOTE — Progress Notes (Signed)
   09/21/23 1030  Spiritual Encounters  Type of Visit Initial  Care provided to: Family (Patient in CT)  Conversation partners present during encounter Nurse  Referral source Code page  Reason for visit Code  OnCall Visit Yes  Interventions  Spiritual Care Interventions Made Established relationship of care and support;Compassionate presence;Reflective listening  Intervention Outcomes  Outcomes Connection to spiritual care;Awareness around self/spiritual resourses  Spiritual Care Plan  Spiritual Care Issues Still Outstanding Chaplain will continue to follow   Aneta Bar, waiting in room while patient in CT. Chaplain spoke with Gavin and they are looking forward to having a 89th birthday party for patient in August.

## 2023-09-21 NOTE — Assessment & Plan Note (Signed)
 CPAP nightly ordered

## 2023-09-21 NOTE — Progress Notes (Signed)
 Echocardiogram 2D Echocardiogram has been performed.  [name] 09/21/2023, 5:06 PM

## 2023-09-21 NOTE — Evaluation (Signed)
 Occupational Therapy Evaluation Patient Details Name: [name] MRN: [id number] DOB: 06/11/1931 Today's Date: 09/21/2023   History of Present Illness   89 y.o. male with a PMHx of cerebellar stroke (manifested with dizziness and unsteadiness), mild cognitive impairment, OSA on CPAP, prediabetes, squamous cell carcinoma of the skin, 1st degree AV block, bilateral ICA stenosis, dyslipidemia, left foot osteomyelitis and HTN who presents to the ED for evaluation after he experienced acute onset of dizziness and gait unsteadiness while on a walk this morning. He describes the dizziness as a mix of vertigo and a sensation as though on a rocking boat or being drunk. He was so unsteady that he had to stop his walk and lean against a sign. A neighbor found him and assisted him back to his home. He then presented to the ED via POV.     Clinical Impressions Pt seen for OT evaluation this date. Prior to hospital admission, pt was independent in all aspects of ADL/IADL. Spouse notes that he is not driving as much anymore 2/2 gradual increase in forgetfulness. Pt endorses 1 fall in past 68mo. Typically active, no AD for mobility, tries to walk nearly daily. Pt lives with his spouse in a [facility] with level entry and walk in shower. Currently pt demonstrates mild impairments in L sided strength, coordination, sensation, and balance impairing his ability to perform mobility and ADL tasks at baseline independence. Pt required CGA and RW for mobility in the hall and PRN VC for safety as he tends to move quickly. Pt endorses mild dizziness during session. No visual deficits appreciated. PRN MIN A for LB ADL tasks. Pt would benefit from additional skilled OT services to maximize recall and carryover of learned strategies as well as to maximize return to PLOF/independence, minimize falls risk, and minimize caregiver burden.     If plan is discharge home, recommend the following:   A little help with walking  and/or transfers;A little help with bathing/dressing/bathroom;Assistance with cooking/housework;Assist for transportation;Direct supervision/assist for medications management     Functional Status Assessment   Patient has had a recent decline in their functional status and demonstrates the ability to make significant improvements in function in a reasonable and predictable amount of time.     Equipment Recommendations   Other (comment) (2WW)      Precautions/Restrictions   Precautions Precautions: Fall Recall of Precautions/Restrictions: Intact Restrictions Weight Bearing Restrictions Per Provider Order: No     Mobility Bed Mobility Overal bed mobility: Modified Independent    Transfers Overall transfer level: Needs assistance Equipment used: None Transfers: Sit to/from Stand Sit to Stand: Supervision      Balance Overall balance assessment: Needs assistance Sitting-balance support: No upper extremity supported, Feet supported Sitting balance-Leahy Scale: Good     Standing balance support: Bilateral upper extremity supported, Reliant on assistive device for balance, During functional activity Standing balance-Leahy Scale: Fair       ADL either performed or assessed with clinical judgement   ADL Overall ADL's : Needs assistance/impaired     Lower Body Dressing: Sit to/from stand;Minimal assistance Lower Body Dressing Details (indicate cue type and reason): shoes             Functional mobility during ADLs: Contact guard assist;Rolling walker (2 wheels);Cueing for safety       Vision Ability to See in Adequate Light: 0 Adequate Patient Visual Report: Blurring of vision Vision Assessment?: No apparent visual deficits Additional Comments: Pt reported mild blurry vision in B eyes this  morning but has resolved and reports vision back to baseline. No overt deficits noted with assessment.            Pertinent Vitals/Pain Pain Assessment Pain  Assessment: No/denies pain     Extremity/Trunk Assessment Upper Extremity Assessment Upper Extremity Assessment: Right hand dominant;LUE deficits/detail LUE Deficits / Details: L shoulder flexion 4-/5, all else 5/5; mild FMC and sensory deficits compared to RUE LUE Sensation: decreased light touch LUE Coordination: decreased fine motor;decreased gross motor   Lower Extremity Assessment Lower Extremity Assessment: Defer to PT evaluation;LLE deficits/detail LLE Deficits / Details: mild FMC and strength impairments noted as compared to R side LLE Sensation: decreased light touch LLE Coordination: decreased fine motor;decreased gross motor       Communication Communication Communication: Impaired Factors Affecting Communication: Hearing impaired   Cognition Arousal: Alert Behavior During Therapy: WFL for tasks assessed/performed       OT - Cognition Comments: Spouse notes that pt is becoming more forgetful       Following commands: Impaired Following commands impaired: Follows one step commands with increased time, Follows multi-step commands inconsistently     Cueing  General Comments   Cueing Techniques: Verbal cues;Tactile cues  Pt endorsing mild lightheadedness/dizziness in standing and while ambulating but did not worsen over time.           Home Living Family/patient expects to be discharged to:: Private residence Living Arrangements: Spouse/significant other Available Help at Discharge: Family;Available 24 hours/day Type of Home: House Home Access: Level entry     Home Layout: One level     Bathroom Shower/Tub: Walk-in shower         Home Equipment: Shower seat;Cane - single point;Adaptive equipment Adaptive Equipment: Sock aid        Prior Functioning/Environment Prior Level of Function : Driving;History of Falls (last six months);Independent/Modified Independent       Mobility Comments: Pt/spouse report indep with mobility, 1 fall, typically  walks ~75min nearly every day ADLs Comments: Pt/spouse report indep with ADL, med mgt, short distance familiar driving (spouse does majority of driving now), laundry, dishes.    OT Problem List: Decreased strength;Decreased coordination;Impaired sensation;Decreased safety awareness;Impaired balance (sitting and/or standing);Decreased knowledge of use of DME or AE   OT Treatment/Interventions: Self-care/ADL training;Therapeutic exercise;Therapeutic activities;Neuromuscular education;Cognitive remediation/compensation;DME and/or AE instruction;Patient/family education;Balance training      OT Goals(Current goals can be found in the care plan section)   Acute Rehab OT Goals Patient Stated Goal: get better and go home OT Goal Formulation: With patient/family Time For Goal Achievement: 10/05/23 Potential to Achieve Goals: Good ADL Goals Pt Will Perform Lower Body Dressing: with modified independence;sit to/from stand Pt Will Transfer to Toilet: with modified independence;ambulating (LRAD) Pt Will Perform Toileting - Clothing Manipulation and hygiene: with modified independence;sitting/lateral leans;sit to/from stand Additional ADL Goal #1: Pt will demonstrate indep with [facility] ex for LUE with handout provided.   OT Frequency:  Min 2X/week       AM-PAC OT "6 Clicks" Daily Activity     Outcome Measure Help from another person eating meals?: None Help from another person taking care of personal grooming?: A Little Help from another person toileting, which includes using toliet, bedpan, or urinal?: A Little Help from another person bathing (including washing, rinsing, drying)?: A Little Help from another person to put on and taking off regular upper body clothing?: A Little Help from another person to put on and taking off regular lower body clothing?: A Little 6 Click Score:  19   End of Session Equipment Utilized During Treatment: Gait belt;Rolling walker (2 wheels) Nurse Communication:  Mobility status  Activity Tolerance: Patient tolerated treatment well Patient left: in bed;with call bell/phone within reach;with bed alarm set;with family/visitor present  OT Visit Diagnosis: Other abnormalities of gait and mobility (R26.89);History of falling (Z91.81);Hemiplegia and hemiparesis Hemiplegia - Right/Left: Left Hemiplegia - dominant/non-dominant: Non-Dominant Hemiplegia - caused by: Unspecified                Time: 1478-2956 OT Time Calculation (min): 29 min Charges:  OT General Charges $OT Visit: 1 Visit OT Evaluation $OT Eval Low Complexity: 1 Low OT Treatments $Therapeutic Activity: 8-22 mins  Berenda Breaker., MPH, MS, OTR/L ascom [phone] 09/21/23, 5:11 PM

## 2023-09-21 NOTE — H&P (Signed)
 History and Physical   [name]:[id number] DOB: 07/21/1931 DOA: 09/21/2023  PCP: Claire Crick, MD Outpatient Specialists: Dr. Lauro Portal, cardiology Patient coming from: Home via EMS  I have personally briefly reviewed patient's old medical records in [facility] EMR.  Chief Concern: Balance, dizziness  HPI: [name] is an 89 year old male with history of hypertension, history of CVA, mild cognitive decline, GERD, anemia, who presents emergency department for chief concerns of dizziness and inability to walk.  Vitals in the ED showed temperature of 97.5, respiration of 18, heart rate 69, blood pressure 143/77, SpO2 96% on room air.  Serum sodium is 137, potassium 4.3, chloride 104, bicarb 25, BUN of 23, serum creatinine 1.09, EGFR greater than 60, nonfasting glucose 98, WBC 5.6, hemoglobin 12.3, platelets of 250  Code stroke was called.  ED treatment: None. ----------------------------------- At bedside, patient was able to tell me his first and last name, age, location, current calendar year.  He denies trauma to his person.  He was walking 1 block which is his usual walking pattern.  He felt dizzy and weak and imbalance and unable to complete the walk.  Unable who was walking saw and called EMS for help.  He reports persistent left-sided weakness at this time.  He reports this is new for him.  He denies chest pain, nausea, vomiting, diarrhea, dysuria, hematuria, blood in his stool, swelling, syncope.  Social history: He lives at home with his wife of nearly 15 years.  ROS: Constitutional: no weight change, no fever ENT/Mouth: no sore throat, no rhinorrhea Eyes: no eye pain, no vision changes Cardiovascular: no chest pain, no dyspnea,  no edema, no palpitations Respiratory: no cough, no sputum, no wheezing Gastrointestinal: no nausea, no vomiting, no diarrhea, no constipation Genitourinary: no urinary incontinence, no dysuria, no  hematuria Musculoskeletal: no arthralgias, no myalgias Skin: no skin lesions, no pruritus, Neuro: + weakness (L > R), no loss of consciousness, no syncope Psych: no anxiety, no depression, no decrease appetite Heme/Lymph: no bruising, no bleeding  ED Course: Discussed with EDP, patient requiring hospitalization for chief concerns of stroke like symptoms.  Assessment/Plan  Principal Problem:   Stroke-like symptom Active Problems:   GERD (gastroesophageal reflux disease)   HTN (hypertension)   OSA on CPAP   Dyslipidemia   MCI (mild cognitive impairment)   History of cerebellar stroke   Assessment and Plan:  * Stroke-like symptom Neurology has been consulted and we appreciate further recommendations Complete echo Fasting lipid and A1c ordered Permissive hypertension per neurology recommendations Frequent neuro vascular checks N.p.o. pending swallow screen PT, OT, SLP Fall precaution  Dyslipidemia Home dosing of pravastatin  20 mg 2 times per week, Monday and Thursday resumed on admission  OSA on CPAP CPAP nightly ordered  HTN (hypertension) Hydralazine  5 mg IV every 6 hours as needed for SBP greater 185, 1 day ordered  GERD (gastroesophageal reflux disease) Home PPI resumed  Chart reviewed.   DVT prophylaxis: TED hose Code Status: Full code Diet: heart healthy Family Communication: Discussed with spouse at bedside with patient's permission Disposition Plan: Pending clinical course Consults called: Neurology Admission status: Telemetry medical, observation  Past Medical History:  Diagnosis Date   Actinic keratosis    AV block, 1st degree 10/05/2021   With bradycardia 09/2021 - drop Toprol  XL to 25mg  daily, increase amlodipine  to 10mg  daily. Donepezil  can also cause AV block.      Carotid stenosis, asymptomatic, bilateral 09/16/2016   Diffuse 1-39% bilaterally, but involving ICA,  ECA, CCA - rpt 1 yr (09/2016)   Complication of anesthesia    slow to wake up after  a colonoscopy years ago   Diverticulitis    Dyslipidemia 09/04/2017   Atorvastatin  elevated CPK levels.      GERD (gastroesophageal reflux disease)    Gout    History of cerebellar stroke 01/09/2022   Cerebellar stroke     History of osteomyelitis 10/2015   left foot   History of squamous cell carcinoma of skin    HTN (hypertension)    Infection of left great toe due to methicillin resistant Staphylococcus aureus (MRSA) 2015   Hx s/p IV abx 7 wks through PICC (~2015)     MCI (mild cognitive impairment) 02/17/2020   MMSE (02/2020) 24/30, 25 with cue. Misses 3 orientation, 2 recall (1 with cue), 1 language. Independent ADLs and IADLs. 4/4 clock drawing test.   MMSE 08/2021 23/30, CDT 4/4     OSA on CPAP 09/01/2016   Osteoarthritis    back, neck, knees - multiple joints   Personal history of malignant neoplasm of larynx 2009   s/p XRT, Skin Cancer- "pre cancerous"   Pre-diabetes    Prediabetes 01/09/2022   Seasonal allergies    Sensorineural hearing loss (SNHL) of both ears 09/01/2016   Sinus bradycardia 02/07/2022   Sinus bradycardia     Squamous cell carcinoma of skin unknown   Treated in [place] - SCC of the L post auricular    Squamous cell carcinoma of skin 10/26/2022   R med knee, EDC   Past Surgical History:  Procedure Laterality Date   ACHILLES TENDON REPAIR Left    AMPUTATION Left 05/16/2019   LEFT GREAT TOE AMPUTATION for osteomyelitis Julio Ohm, Marshia Skene, MD), 2nd toe removed   APPENDECTOMY     COLONOSCOPY     THROAT SURGERY N/A 2009   ?vocal cord vs larynx nodule removal, had XRT after this but states it was not cancer   TONSILLECTOMY     TREATMENT FISTULA ANAL     Social History:  reports that he has never smoked. He has never used smokeless tobacco. He reports current alcohol use. He reports that he does not use drugs.  Allergies  Allergen Reactions   Lisinopril Cough   Atorvastatin  Other (See Comments)    Myalgias and mildly elevated CPK    Family History   Problem Relation Age of Onset   Hypertension Mother    Hypertension Father    Cancer Other        unknown - niece   CAD Sister 85       stent (80% blockage)   CAD Brother 18       stent (100% blockage)   Family history: Family history reviewed and not pertinent.  Prior to Admission medications   Medication Sig Start Date End Date Taking? Authorizing Provider  Acetaminophen  Extra Strength 500 MG CAPS Take by mouth daily as needed. OTC    [provider]  amLODipine  (NORVASC ) 10 MG tablet Take 1 tablet (10 mg total) by mouth daily. 04/13/23   Claire Crick, MD  azelastine  (ASTELIN ) 0.1 % nasal spray Place 1 spray into both nostrils 2 (two) times daily. Use in each nostril as directed 04/13/23   Claire Crick, MD  cetirizine (ZYRTEC) 10 MG tablet Take 10 mg by mouth daily. Alternates weekly with allegra. OTC    [provider]  clopidogrel  (PLAVIX ) 75 MG tablet Take 1 tablet (75 mg total) by mouth daily.  04/13/23   Claire Crick, MD  Coenzyme Q10-Vitamin E (QUNOL ULTRA COQ10 PO) Take 1 capsule by mouth daily. OTC    [provider]  cyanocobalamin  (VITAMIN B12) 1000 MCG tablet Take 1 tablet (1,000 mcg total) by mouth daily. Patient taking differently: Take 1,000 mcg by mouth daily. OTC 12/05/22   Claire Crick, MD  donepezil  (ARICEPT ) 5 MG tablet Take 1 tablet (5 mg total) by mouth at bedtime. 04/13/23   Claire Crick, MD  fexofenadine (ALLEGRA) 180 MG tablet Take 180 mg by mouth daily. Alternates weekly with zyrtec. OTC    [provider]  hydrALAZINE  (APRESOLINE ) 50 MG tablet Take 1 tablet (50 mg total) by mouth 3 (three) times daily. 04/13/23   Claire Crick, MD  losartan  (COZAAR ) 50 MG tablet Take 1 tablet (50 mg total) by mouth daily. 04/13/23   Claire Crick, MD  Magnesium  400 MG TABS Take 1 tablet by mouth daily. OTC    [provider]  Misc Natural Products (OSTEO BI-FLEX TRIPLE STRENGTH) TABS Take 1 tablet by mouth  daily. + vitamin D . OTC    [provider]  Multiple Vitamins-Minerals (ONE-A-DAY MENS 50+) TABS Take 1 tablet by mouth daily. OTC    [provider]  pantoprazole  (PROTONIX ) 20 MG tablet Take 1 tablet (20 mg total) by mouth daily. 04/13/23   Claire Crick, MD  polyethylene glycol [facility] / GLYCOLAX) packet Take 8.5 g by mouth daily.     [provider]  pravastatin  (PRAVACHOL ) 20 MG tablet Take 1 tablet (20 mg total) by mouth 2 (two) times a week. 04/16/23   Claire Crick, MD  triamcinolone  cream (KENALOG ) 0.1 % Apply to affected areas rash on right hip twice daily until improved. Avoid applying to face, groin, and axilla. Use as directed. Long-term use can cause thinning of the skin. 04/19/23   Elta Halter, MD   Physical Exam: Vitals:   09/21/23 1021 09/21/23 1025 09/21/23 1520  BP: (!) 143/77  (!) 163/74  Pulse: 69  65  Resp: 18  16  Temp: (!) 97.5 F (36.4 C)  97.6 F (36.4 C)  TempSrc: Oral    SpO2: 96%  99%  Weight:  92 kg   Height:  5\' 9"  (1.753 m)    Constitutional: appears age appropriate, NAD, calm Eyes: PERRL, lids and conjunctivae normal ENMT: Mucous membranes are moist. Posterior pharynx clear of any exudate or lesions. Age-appropriate dentition. Hearing appropriate Neck: normal, supple, no masses, no thyromegaly Respiratory: clear to auscultation bilaterally, no wheezing, no crackles. Normal respiratory effort. No accessory muscle use.  Cardiovascular: Regular rate and rhythm, no murmurs / rubs / gallops. No extremity edema. 2+ pedal pulses. No carotid bruits.  Abdomen: no tenderness, no masses palpated, no hepatosplenomegaly. Bowel sounds positive.  Musculoskeletal: no clubbing / cyanosis. No joint deformity upper and lower extremities. Good ROM, no contractures, no atrophy. Normal muscle tone.  Skin: no rashes, lesions, ulcers. No induration Neurologic: Sensation intact. Strength 5/5 in all 4.  Psychiatric: Normal judgment and  insight. Alert and oriented x 3. Normal mood.   EKG: Ordered and pending completion  Chest x-ray on Admission: Not indicated at this time  MR BRAIN WO CONTRAST Result Date: 09/21/2023 CLINICAL DATA:  89 year old male code stroke presentation, chronically but progressively abnormal vertebrobasilar system on CTA today. EXAM: MRI HEAD WITHOUT CONTRAST TECHNIQUE: Multiplanar, multiecho pulse sequences of the brain and surrounding structures were obtained without intravenous contrast. COMPARISON:  CT head, CTA head and neck today reported  separately. Previous brain MRI elevn 06/27/2021. FINDINGS: Brain: No restricted diffusion or evidence of acute infarction. Numerous small chronic left cerebellar, left PICA territory infarcts are increased since 2023. Small new midline and contralateral right cerebellar hemisphere chronic infarcts also (series 10, images 7 and 8). The brainstem remains normal. And supratentorial gray and white matter signal is stable with only mild for age nonspecific white matter changes, minimal chronic microhemorrhage. Vascular: Distal vertebral and proximal basilar flow voids are abnormal concordant with the CTA findings today. See series 10, images 5 and 7. Other Major intracranial vascular flow voids are preserved. Skull and upper cervical spine: Negative. Sinuses/Orbits: Negative. Chronic postoperative changes to both globes. Other: Mastoids are clear. Visible internal auditory structures appear normal. IMPRESSION: 1. Negative for acute infarct, despite abnormal distal Vertebral arteries and Basilar as detailed on CTA today separately. 2. Progressed since 2023 but chronic small infarcts in the left > right cerebellum. Brainstem signal remains normal. 3. Stable since 2023 and mild for age supratentorial signal changes compatible with small vessel disease. Electronically Signed   By: Marlise Simpers M.D.   On: 09/21/2023 12:34   CT ANGIO HEAD NECK W WO CM Result Date: 09/21/2023 CLINICAL DATA:   89 year old male with severe vertigo. Sudden onset dizziness 0830 hours. 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 EXAM: CT ANGIOGRAPHY HEAD AND NECK WITH CONTRAST TECHNIQUE: Multidetector CT imaging of the head and neck was performed using the standard protocol during bolus administration of intravenous contrast. Multiplanar CT image reconstructions and MIPs were obtained to evaluate the vascular anatomy. Carotid stenosis measurements (when applicable) are obtained utilizing NASCET criteria, using the distal internal carotid diameter as the denominator. RADIATION DOSE REDUCTION: This exam was performed according to the departmental dose-optimization program which includes automated exposure control, adjustment of the mA and/or kV according to patient size and/or use of iterative reconstruction technique. CONTRAST:  75mL OMNIPAQUE  IOHEXOL  350 MG/ML SOLN COMPARISON:  Head CT today 1028 hours. FINDINGS: CTA head and neck 01/09/2022.  CTA NECK Skeleton: Cervical spine degeneration with degenerative ankylosis. Hyperostotic related upper thoracic interbody ankylosis also. No acute osseous abnormality identified. Upper chest: Mild upper lung atelectasis. Negative visible superior mediastinum. Other neck: Nonvascular neck soft tissue spaces appear negative. Aortic arch: Calcified aortic atherosclerosis. Bovine arch and the left vertebral artery also arises directly from the arch. Right carotid system: Brachiocephalic artery plaque and tortuosity with no significant stenosis through the right CCA origin. Soft and calcified right CCA plaque without stenosis before the bifurcation. However, superimposed bulky calcified plaque at the right carotid bifurcation with estimated 62% stenosis series 7, image 230, appears increased. Right ICA remains patent with additional calcified plaque at the bulb. Left carotid system: Bovine left CCA origin with mild tortuosity and mild plaque, no stenosis.  Soft and calcified plaque in the left CCA before the bifurcation. Calcified plaque at the left ICA origin and bulb but no stenosis. Additional soft and calcified plaque of the left ICA just below the skull base is chronic, appears stable with 50 % stenosis with respect to the distal vessel on series 7, image 138. Vertebral arteries: Heavily calcified proximal right subclavian artery with tortuosity. The right vertebral artery origin is patent with calcified plaque but no stenosis. However, the right vertebral artery is diminutive, has a late entry into the cervical transverse foramen. And it appears occluded now at the level of the skull base, in the V3 segment. Left vertebral artery arises directly from the arch and  is chronically occluded in the V1 segment of the superior mediastinum. Reconstitution of the left V2 segment is maintained by muscular branches at the C3 vertebral level, not significantly changed. The left vertebral remains patent to the skull base. CTA HEAD Posterior circulation: Distal right vertebral artery is occluded which is new from 2023. Atherosclerotic left V4 segment is chronically stenotic, with critical stenosis at the vertebrobasilar junction that does not appear significantly changed from 2023 on series 7, image 134. Despite that the basilar artery is patent, atherosclerotic and stenotic. The proximal basilar is somewhat poorly enhancing similar to 2023, but distal basilar stenosis has progressed and appears severe now on series 11, image 16. The basilar tip is chronically supplied by posterior communicating artery collaterals. It remains patent with robust enhancement. Bilateral posterior communicating artery and right P2 segment atherosclerosis. The right P2 segment stenosis is moderate to severe. Anterior circulation: Both ICA siphons are patent with calcified plaque. Heavily calcified left ICA cavernous and supraclinoid segment with moderate to severe stenosis at the anterior genu on  series 8, image 42. Similar right siphon calcified plaque and distal cavernous, moderate to severe supraclinoid stenosis (series 8, image 50). Compared to 2023 these are mildly progressed. Despite that posterior communicating artery origins, carotid termini, MCA and ACA origins remain patent. Normal anterior communicating artery, bilateral PCA branches. Left MCA M1 segment and trifurcation are patent without stenosis. Right MCA M1 segment and bifurcation are patent without stenosis. Bilateral MCA branches are stable and within normal limits. Venous sinuses: Earlier contrast timing now. Superior sagittal sinus is patent. Anatomic variants: Bovine arch configuration and the dominant left vertebral artery arises directly from the arch. Review of the MIP images confirms the above findings IMPRESSION: 1. Pre-existing Critical atherosclerotic stenosis of the Vertebrobasilar junction, with New Occlusion of the distal Right Vertebral artery since the 2023 CTA. Chronic occlusion of the proximal Left Vertebral Artery but stable reconstitution. But subsequently decreased Basilar artery enhancement compared to 2023, and evidence of progressed underlying Basilar atherosclerosis, stenosis. Basilar tip chronically supplied from P-comm collaterals, stable. 2. Other fairly severe atherosclerosis in the Head and Neck including: - Severe bilateral ICA siphon stenosis due to bulky calcified plaque, stable to progressed. - RICA origin stenosis estimated at 62%. - 50% stenosis LICA below the skull base. 3.  Aortic Atherosclerosis (ICD10-I70.0). Salient findings discussed by telephone with Dr. Renaee Caro, Neurology on 09/21/2023 at 12:05 . Electronically Signed   By: Marlise Simpers M.D.   On: 09/21/2023 12:08   CT HEAD CODE STROKE WO CONTRAST Result Date: 09/21/2023 CLINICAL DATA:  Code stroke.  89 year old male. EXAM: CT HEAD WITHOUT CONTRAST TECHNIQUE: Contiguous axial images were obtained from the base of the skull through the vertex without  intravenous contrast. RADIATION DOSE REDUCTION: This exam was performed according to the departmental dose-optimization program which includes automated exposure control, adjustment of the mA and/or kV according to patient size and/or use of iterative reconstruction technique. COMPARISON:  Brain MRI and head CT 03/29/2022. FINDINGS: Brain: Stable cerebral volume since 2023. Small left inferior cerebellar infarcts have increased in number since 2023, but are circumscribed and chronic by CT (coronal images 59 through 61. Supratentorial gray-white differentiation is stable, normal for age. No midline shift, ventriculomegaly, mass effect, evidence of mass lesion, intracranial hemorrhage or evidence of cortically based acute infarction. Vascular: Extensive Calcified atherosclerosis at the skull base. No suspicious intracranial vascular hyperdensity. Skull: Stable and intact.  No acute osseous abnormality identified. Sinuses/Orbits: Visualized paranasal sinuses and mastoids are stable and  well aerated. Other: No gaze deviation. Stable orbit and scalp soft tissues. Some calcified scalp vessel atherosclerosis. ASPECTS [facility] Stroke Program Early CT Score) Total score (0-10 with 10 being normal): 10 IMPRESSION: 1. No acute cortically based infarct or acute intracranial hemorrhage identified. ASPECTS 10. 2. Increased number of chronic small left cerebellar infarcts since 2023. 3. These results were communicated to Dr. Lindzen at 10:36 am on 09/21/2023 by text page via the [facility] messaging system. Electronically Signed   By: Marlise Simpers M.D.   On: 09/21/2023 10:36   Labs on Admission: I have personally reviewed following labs  CBC: Recent Labs  Lab 09/21/23 1035  WBC 5.6  NEUTROABS 3.8  HGB 12.3*  HCT 36.4*  MCV 95.8  PLT 250   Basic Metabolic Panel: Recent Labs  Lab 09/21/23 1035  NA 137  K 4.3  CL 104  CO2 25  GLUCOSE 98  BUN 23  CREATININE 1.09  CALCIUM  8.6*   GFR: Estimated Creatinine Clearance:  51.5 mL/min (by C-G formula based on SCr of 1.09 mg/dL).  Liver Function Tests: Recent Labs  Lab 09/21/23 1035  AST 27  ALT 21  ALKPHOS 42  BILITOT 0.8  PROT 6.7  ALBUMIN 3.9   Coagulation Profile: Recent Labs  Lab 09/21/23 1035  INR 1.2   CBG: Recent Labs  Lab 09/21/23 1021  GLUCAP 109*   Urine analysis:    Component Value Date/Time   COLORURINE STRAW (A) 03/29/2022 0734   APPEARANCEUR CLEAR (A) 03/29/2022 0734   LABSPEC 1.012 03/29/2022 0734   PHURINE 6.0 03/29/2022 0734   GLUCOSEU NEGATIVE 03/29/2022 0734   HGBUR NEGATIVE 03/29/2022 0734   BILIRUBINUR NEGATIVE 03/29/2022 0734   KETONESUR NEGATIVE 03/29/2022 0734   PROTEINUR NEGATIVE 03/29/2022 0734   NITRITE NEGATIVE 03/29/2022 0734   LEUKOCYTESUR NEGATIVE 03/29/2022 0734   This document was prepared using Dragon Voice Recognition software and may include unintentional dictation errors.  Dr. Reinhold Carbine [facility]  If 7PM-7AM, please contact overnight-coverage provider If 7AM-7PM, please contact day attending provider [email or web address]  09/21/2023, 4:58 PM

## 2023-09-21 NOTE — Progress Notes (Signed)
 SLP Cancellation Note  Patient Details Name: [name] MRN: [id number] DOB: 1932-04-06   Cancelled treatment:       Reason Eval/Treat Not Completed: SLP screened, no needs identified, will sign off (chart reviewed; consulted NSG and met w/ pt)  Pt denied any difficulty swallowing and is currently on a regular diet post passing NSG swallowing screen; tolerates swallowing pills w/ water per NSG. Pt denied any deficits when eating luch meal today.  Pt was verbal and talkative conversing in general conversation w/out overt expressive/receptive deficits noted; pt denied any speech-language deficits. Speech clear. Pt followed instructions and helped himself to the bathroom w/ NSG observation and a walker. Pt endorsed he "did not want to fall".    No further skilled ST services indicated as pt appears at his baseline. Pt agreed. Family arrived. NSG to reconsult if any change in status while admitted.     Darla Edward, MS, CCC-SLP Speech Language Pathologist Rehab Services; [facility] [phone] (ascom) Alvie Speltz 09/21/2023, 3:42 PM

## 2023-09-21 NOTE — ED Notes (Signed)
Pharm at bedside

## 2023-09-21 NOTE — Consult Note (Addendum)
 NEUROLOGY CONSULT NOTE   Date of service: Sep 21, 2023 Patient Name: RODDRICK SHARRON MRN:  [id number] DOB:  Oct 08, 1931 Chief Complaint: Acute onset of dizziness Requesting Provider: Arline Bennett, MD  History of Present Illness  IZIAH CATES is a 89 y.o. male with a PMHx of cerebellar stroke (manifested with dizziness and unsteadiness), mild cognitive impairment, OSA on CPAP, prediabetes, squamous cell carcinoma of the skin, 1st degree AV block, bilateral ICA stenosis, dyslipidemia, left foot osteomyelitis and HTN who presents to the ED for evaluation after he experienced acute onset of dizziness and gait unsteadiness while on a walk this morning. He describes the dizziness as a mix of vertigo and a sensation as though on a rocking boat or being drunk. He was so unsteady that he had to stop his walk and lean against a sign. A neighbor found him and assisted him back to his home. He then presented to the ED via POV. LKN was 0830. CBG 109 in the ED.   Denies any limb numbness, limb weakness, paresthesias, oscillopsia, facial weakness, confusion, speech deficit or vision changes. Also with no CP or SOB.   LKW: 0830 Modified rankin score: 0-Completely asymptomatic and back to baseline post- stroke IV Thrombolysis: No; symptoms too mild to treat EVT: No; overall presentation is not consistent with LVO   NIHSS components Score: Comment  1a Level of Conscious 0[x]  1[]  2[]  3[]      1b LOC Questions 0[x]  1[]  2[]       1c LOC Commands 0[x]  1[]  2[]       2 Best Gaze 0[x]  1[]  2[]      No nystagmus  3 Visual 0[x]  1[]  2[]  3[]      4 Facial Palsy 0[x]  1[]  2[]  3[]      5a Motor Arm - left 0[x]  1[]  2[]  3[]  4[]  UN[]    5b Motor Arm - Right 0[x]  1[]  2[]  3[]  4[]  UN[]    6a Motor Leg - Left 0[x]  1[]  2[]  3[]  4[]  UN[]    6b Motor Leg - Right 0[x]  1[]  2[]  3[]  4[]  UN[]    7 Limb Ataxia 0[]  1[x]  2[]  UN[]     LUE RAM abnormal. FNF normal. H-S normal.   8 Sensory 0[]  1[x]  2[]  UN[]     Decreased temp sensation to left  face.   9 Best Language 0[x]  1[]  2[]  3[]      10 Dysarthria 0[x]  1[]  2[]  UN[]      11 Extinct. and Inattention 0[x]  1[]  2[]       TOTAL:  2    Other findings: Can stand with own power, but gait is wide-based and unstable, requiring 2 person assist.    ROS  Comprehensive ROS performed and pertinent positives documented in HPI    Past History    Past Medical History:  Diagnosis Date   Actinic keratosis    AV block, 1st degree 10/05/2021   With bradycardia 09/2021 - drop Toprol  XL to 25mg  daily, increase amlodipine  to 10mg  daily. Donepezil  can also cause AV block.      Carotid stenosis, asymptomatic, bilateral 09/16/2016   Diffuse 1-39% bilaterally, but involving ICA, ECA, CCA - rpt 1 yr (09/2016)   Complication of anesthesia    slow to wake up after a colonoscopy years ago   Diverticulitis    Dyslipidemia 09/04/2017   Atorvastatin  elevated CPK levels.      GERD (gastroesophageal reflux disease)    Gout    History of cerebellar stroke 01/09/2022   Cerebellar stroke     History of osteomyelitis 10/2015  left foot   History of squamous cell carcinoma of skin    HTN (hypertension)    Infection of left great toe due to methicillin resistant Staphylococcus aureus (MRSA) 2015   Hx s/p IV abx 7 wks through PICC (~2015)     MCI (mild cognitive impairment) 02/17/2020   MMSE (02/2020) 24/30, 25 with cue. Misses 3 orientation, 2 recall (1 with cue), 1 language. Independent ADLs and IADLs. 4/4 clock drawing test.   MMSE 08/2021 23/30, CDT 4/4     OSA on CPAP 09/01/2016   Osteoarthritis    back, neck, knees - multiple joints   Personal history of malignant neoplasm of larynx 2009   s/p XRT, Skin Cancer- "pre cancerous"   Pre-diabetes    Prediabetes 01/09/2022   Seasonal allergies    Sensorineural hearing loss (SNHL) of both ears 09/01/2016   Sinus bradycardia 02/07/2022   Sinus bradycardia     Squamous cell carcinoma of skin unknown   Treated in [place] - SCC of the L post auricular     Squamous cell carcinoma of skin 10/26/2022   R med knee, [facility]    Past Surgical History:  Procedure Laterality Date   ACHILLES TENDON REPAIR Left    AMPUTATION Left 05/16/2019   LEFT GREAT TOE AMPUTATION for osteomyelitis Julio Ohm, Marshia Skene, MD), 2nd toe removed   APPENDECTOMY     COLONOSCOPY     THROAT SURGERY N/A 2009   ?vocal cord vs larynx nodule removal, had XRT after this but states it was not cancer   TONSILLECTOMY     TREATMENT FISTULA ANAL      Family History: Family History  Problem Relation Age of Onset   Hypertension Mother    Hypertension Father    Cancer Other        unknown - niece   CAD Sister 48       stent (80% blockage)   CAD Brother 27       stent (100% blockage)    Social History  reports that he has never smoked. He has never used smokeless tobacco. He reports current alcohol use. He reports that he does not use drugs.  Allergies  Allergen Reactions   Lisinopril Cough   Atorvastatin  Other (See Comments)    Myalgias and mildly elevated CPK     Medications   Current Facility-Administered Medications:    sodium chloride  flush (NS) 0.9 % injection 3 mL, 3 mL, Intravenous, Once, Arline Bennett, MD  Current Outpatient Medications:    Acetaminophen  Extra Strength 500 MG CAPS, Take by mouth daily as needed. OTC, Disp: , Rfl:    amLODipine  (NORVASC ) 10 MG tablet, Take 1 tablet (10 mg total) by mouth daily., Disp: 90 tablet, Rfl: 4   azelastine  (ASTELIN ) 0.1 % nasal spray, Place 1 spray into both nostrils 2 (two) times daily. Use in each nostril as directed, Disp: 30 mL, Rfl: 12   cetirizine (ZYRTEC) 10 MG tablet, Take 10 mg by mouth daily. Alternates weekly with allegra. OTC, Disp: , Rfl:    clopidogrel  (PLAVIX ) 75 MG tablet, Take 1 tablet (75 mg total) by mouth daily., Disp: 90 tablet, Rfl: 4   Coenzyme Q10-Vitamin E (QUNOL ULTRA COQ10 PO), Take 1 capsule by mouth daily. OTC, Disp: , Rfl:    cyanocobalamin  (VITAMIN B12) 1000 MCG tablet, Take 1 tablet (1,000  mcg total) by mouth daily. (Patient taking differently: Take 1,000 mcg by mouth daily. OTC), Disp: , Rfl:    donepezil  (ARICEPT ) 5 MG  tablet, Take 1 tablet (5 mg total) by mouth at bedtime., Disp: 90 tablet, Rfl: 4   fexofenadine (ALLEGRA) 180 MG tablet, Take 180 mg by mouth daily. Alternates weekly with zyrtec. OTC, Disp: , Rfl:    hydrALAZINE  (APRESOLINE ) 50 MG tablet, Take 1 tablet (50 mg total) by mouth 3 (three) times daily., Disp: 270 tablet, Rfl: 4   losartan  (COZAAR ) 50 MG tablet, Take 1 tablet (50 mg total) by mouth daily., Disp: 90 tablet, Rfl: 4   Magnesium  400 MG TABS, Take 1 tablet by mouth daily. OTC, Disp: , Rfl:    Misc Natural Products (OSTEO BI-FLEX TRIPLE STRENGTH) TABS, Take 1 tablet by mouth daily. + vitamin D . OTC, Disp: , Rfl:    Multiple Vitamins-Minerals (ONE-A-DAY MENS 50+) TABS, Take 1 tablet by mouth daily. OTC, Disp: , Rfl:    pantoprazole  (PROTONIX ) 20 MG tablet, Take 1 tablet (20 mg total) by mouth daily., Disp: 90 tablet, Rfl: 4   polyethylene glycol (MIRALAX  / GLYCOLAX ) packet, Take 8.5 g by mouth daily. , Disp: , Rfl:    pravastatin  (PRAVACHOL ) 20 MG tablet, Take 1 tablet (20 mg total) by mouth 2 (two) times a week., Disp: 24 tablet, Rfl: 4   triamcinolone  cream (KENALOG ) 0.1 %, Apply to affected areas rash on right hip twice daily until improved. Avoid applying to face, groin, and axilla. Use as directed. Long-term use can cause thinning of the skin., Disp: 80 g, Rfl: 0  Vitals   Vitals:   09/21/23 1021 09/21/23 1025  BP: (!) 143/77   Pulse: 69   Resp: 18   Temp: (!) 97.5 F (36.4 C)   TempSrc: Oral   SpO2: 96%   Weight:  92 kg  Height:  5\' 9"  (1.753 m)    Body mass index is 29.95 kg/m.  Physical Exam   Constitutional: Appears well-developed and well-nourished.  Psych: Affect appropriate to situation.  HENT: No OP obstruction.  Head: Normocephalic.  Respiratory: Effort normal, non-labored breathing.    Neurologic Examination   See NIHSS.    Labs/Imaging/Neurodiagnostic studies   CBC: No results for input(s): "WBC", "NEUTROABS", "HGB", "HCT", "MCV", "PLT" in the last 168 hours. Basic Metabolic Panel:  Lab Results  Component Value Date   NA 139 03/27/2023   K 4.0 03/27/2023   CO2 28 03/27/2023   GLUCOSE 94 03/27/2023   BUN 18 03/27/2023   CREATININE 1.10 03/27/2023   CALCIUM  9.0 03/27/2023   GFRNONAA >60 12/10/2022   GFRAA >60 05/16/2019   Lipid Panel:  Lab Results  Component Value Date   LDLCALC 76 03/27/2023   HgbA1c:  Lab Results  Component Value Date   HGBA1C 5.7 03/27/2023   Urine Drug Screen:     Component Value Date/Time   LABOPIA NONE DETECTED 03/29/2022 0734   COCAINSCRNUR NONE DETECTED 03/29/2022 0734   LABBENZ NONE DETECTED 03/29/2022 0734   AMPHETMU NONE DETECTED 03/29/2022 0734   THCU NONE DETECTED 03/29/2022 0734   LABBARB NONE DETECTED 03/29/2022 0734    Alcohol Level     Component Value Date/Time   ETH <10 03/29/2022 0733   INR  Lab Results  Component Value Date   INR 1.1 03/29/2022   APTT  Lab Results  Component Value Date   APTT 39 (H) 03/29/2022   Prior vascular imaging (01/09/22): CTA HEAD AND NECK IMPRESSION:  1. Occlusion of the dominant mid-distal left V1 segment, age indeterminate, but could be acute in nature. Distal reconstitution at the left V2 segment via cervical  collaterals. Additional extensive atheromatous change about the intracranial V4 segments with severe multifocal stenoses as above. 2. Severe 85% stenosis at the origin of the right subclavian artery. 3. Short-segment 50% stenosis about the distal cervical left ICA. 4. 70% atheromatous stenosis about the right carotid bulb. 5. Extensive atheromatous disease throughout the carotid siphons with associated moderate multifocal stenoses.   ASSESSMENT  KROSS SWALLOWS is a 89 y.o. male with a PMHx of cerebellar stroke who presents with acute onset of dizziness and gait unsteadiness. Code Stroke was called  in the ED.  - Exam reveals decreased temp sensation to left side of face, as well as mild LUE ataxia and gait unsteadiness.  - CT head: No acute cortically based infarct or acute intracranial hemorrhage identified. ASPECTS 10. Increased number of chronic small left cerebellar infarcts since 2023. - Impression: DDx for his acute presentation with vertigo and gait unsteadiness includes an acute cerebellar stroke versus acute peripheral vestibulopathy.  - Has had prior reaction to statin use with myalgias and elevated CPK - Stroke risk factors: Prior stroke, intracranial and extracranial atherosclerotic disease demonstrated on prior CTA, HTN, HLD, prediabetes and advanced age  RECOMMENDATIONS  - Continue pravastatin . Do not switch to atorvastatin  as he has a history of prior reaction with this medication (myalgias and elevated CPK).  - CTA of head and neck (eGFR > 60) - TTE - MRI brain - Cardiac telemetry - Add ASA to Plavix  - Permissive HTN x 24 hours - IV NS at 125 cc/hr x 24 hours - HgbA1c, fasting lipid panel - PT consult, OT consult, Speech consult - Risk factor modification - Frequent neuro checks - NPO until passes stroke swallow screen  Addendum: CTA of head and neck: 1. Pre-existing Critical atherosclerotic stenosis of the Vertebrobasilar junction, with New Occlusion of the distal Right Vertebral artery since the 2023 CTA. Chronic occlusion of the proximal Left Vertebral Artery but stable reconstitution. But subsequently decreased Basilar artery enhancement compared to 2023, and evidence of progressed underlying Basilar atherosclerosis, stenosis. Basilar tip chronically supplied from P-comm collaterals, stable. 2. Other fairly severe atherosclerosis in the Head and Neck including: - Severe bilateral ICA siphon stenosis due to bulky calcified plaque, stable to progressed. - RICA origin stenosis estimated at 62%. - 50% stenosis LICA below the skull base. 3.  Aortic atherosclerosis    Addendum: TTE:  1. Left ventricular ejection fraction, by estimation, is 55 to 60%. The  left ventricle has normal function. The left ventricle has no regional  wall motion abnormalities. There is mild left ventricular hypertrophy.  Left ventricular diastolic parameters  are consistent with Grade I diastolic dysfunction (impaired relaxation).   2. Right ventricular systolic function is normal. The right ventricular  size is normal.   3. The mitral valve is normal in structure. Mild mitral valve  regurgitation.   4. The aortic valve is grossly normal. Aortic valve regurgitation is not  visualized.   5. Aortic dilatation noted. There is borderline dilatation of the  ascending aorta, measuring 38 mm.   Addendum: MRI brain: 1. Negative for acute infarct, despite abnormal distal Vertebral arteries and Basilar as detailed on CTA today separately. 2. Progressed since 2023 but chronic small infarcts in the left > right cerebellum. Brainstem signal remains normal. 3. Stable since 2023 and mild for age supratentorial signal changes compatible with small vessel disease.  ______________________________________________________________________    Hope Ly, Abdias Hickam, MD [facility]

## 2023-09-21 NOTE — Assessment & Plan Note (Signed)
 Home dosing of pravastatin  20 mg 2 times per week, Monday and Thursday resumed on admission

## 2023-09-21 NOTE — ED Notes (Signed)
 Pt to CT

## 2023-09-21 NOTE — Telephone Encounter (Signed)
 Copied from CRM [id number]. Topic: Clinical - Red Word Triage >> Sep 21, 2023  9:39 AM Clyde Darling P wrote: Red Word that prompted transfer to Nurse Triage: Dizziness- pt wife state previously he was dizzy and had a stroke  Chief Complaint: dizziness, states last time he was dizzy he had a stroke.  Symptoms: dizzy, weakness, feels funny Frequency: constant Pertinent Negatives: Patient denies fever, sob Disposition: [x] ED /[] Urgent Care (no appt availability in office) / [] Appointment(In office/virtual)/ []  [facility]/ [] Home Care/ [] Refused Recommended Disposition /[] [facility] [place] [facility]/ []  Follow-up with PCP Additional Notes: instructed to go to the ER.  Pcp office updated.   Reason for Disposition  [1] Weakness (i.e., paralysis, loss of muscle strength) of the face, arm or leg on one side of the body AND [2] sudden onset AND [3] present now  Answer Assessment - Initial Assessment Questions 1. DESCRIPTION: "Describe your dizziness."     Light headed, had gone for a walk and developed dizziness. Happened this am.  Someone had to help him back home  2. LIGHTHEADED: "Do you feel lightheaded?" (e.g., somewhat faint, woozy, weak upon standing)     Weak and dizzy states feels funny. 3. VERTIGO: "Do you feel like either you or the room is spinning or tilting?" (i.e. vertigo)     ER 4. SEVERITY: "How bad is it?"  "Do you feel like you are going to faint?" "Can you stand and walk?"   - MILD: Feels slightly dizzy, but walking normally.   - MODERATE: Feels unsteady when walking, but not falling; interferes with normal activities (e.g., school, work).   - SEVERE: Unable to walk without falling, or requires assistance to walk without falling; feels like passing out now.      ER 5. ONSET:  "When did the dizziness begin?"     ER 6. AGGRAVATING FACTORS: "Does anything make it worse?" (e.g., standing, change in head position)     na 7. HEART RATE: "Can you tell me your heart rate?" "How many  beats in 15 seconds?"  (Note: not all patients can do this)       na 8. CAUSE: "What do you think is causing the dizziness?"     na 9. RECURRENT SYMPTOM: "Have you had dizziness before?" If Yes, ask: "When was the last time?" "What happened that time?"     na 10. OTHER SYMPTOMS: "Do you have any other symptoms?" (e.g., fever, chest pain, vomiting, diarrhea, bleeding)       na 11. PREGNANCY: "Is there any chance you are pregnant?" "When was your last menstrual period?"       na  Protocols used: Dizziness - Lightheadedness-A-AH

## 2023-09-21 NOTE — ED Provider Notes (Signed)
 [facility] Provider Note    Event Date/Time   First MD Initiated Contact with Patient 09/21/23 1027     (approximate)   History   Code Stroke   HPI  [name] is a 89 y.o. male who presents to the ED for evaluation of Code Stroke   I reviewed PCP visit from 2 months ago.  History of HTN on multiple agents, HLD and obesity.  Mild cognitive impairment.  Previous stroke.  Patient presents for evaluation of sudden vertigo, dizziness and code stroke activated from triage...  Last known normal around 8:30 AM when he left the house to go walk around the neighborhood.  A neighbor found him hunched over, holding onto a stop sign to steady himself, complaining of sudden severe vertigo.  No falls, syncope or preceding illnesses.   Physical Exam   Triage Vital Signs: ED Triage Vitals  Encounter Vitals Group     BP 09/21/23 1021 (!) 143/77     Systolic BP Percentile --      Diastolic BP Percentile --      Pulse Rate 09/21/23 1021 69     Resp 09/21/23 1021 18     Temp 09/21/23 1021 (!) 97.5 F (36.4 C)     Temp Source 09/21/23 1021 Oral     SpO2 09/21/23 1021 96 %     Weight 09/21/23 1025 202 lb 13.2 oz (92 kg)     Height 09/21/23 1025 5\' 9"  (1.753 m)     Head Circumference --      Peak Flow --      Pain Score 09/21/23 1025 0     Pain Loc --      Pain Education --      Exclude from Growth Chart --     Most recent vital signs: Vitals:   09/21/23 1021  BP: (!) 143/77  Pulse: 69  Resp: 18  Temp: (!) 97.5 F (36.4 C)  SpO2: 96%    General: Awake, no distress.  CV:  Good peripheral perfusion.  Resp:  Normal effort.  Abd:  No distention.  MSK:  No deformity noted.  Neuro:  Mild dysmetria on the left as noted, gait unsteadiness. Other:     ED Results / Procedures / Treatments   Labs (all labs ordered are listed, but only abnormal results are displayed) Labs Reviewed  CBC - Abnormal; Notable for the following components:       Result Value   RBC 3.80 (*)    Hemoglobin 12.3 (*)    HCT 36.4 (*)    nRBC 0.4 (*)    All other components within normal limits  COMPREHENSIVE METABOLIC PANEL WITH GFR - Abnormal; Notable for the following components:   Calcium  8.6 (*)    All other components within normal limits  CBG MONITORING, ED - Abnormal; Notable for the following components:   Glucose-Capillary 109 (*)    All other components within normal limits  PROTIME-INR  APTT  DIFFERENTIAL  ETHANOL  URINE DRUG SCREEN, QUALITATIVE ([facility] ONLY)  CBG MONITORING, ED    EKG   RADIOLOGY CT head interpreted by me without evidence of acute intracranial pathology CTA neck/head with posterior stenosis MRI without stroke acutely  Official radiology report(s): MR BRAIN WO CONTRAST Result Date: 09/21/2023 CLINICAL DATA:  89 year old male code stroke presentation, chronically but progressively abnormal vertebrobasilar system on CTA today. EXAM: MRI HEAD WITHOUT CONTRAST TECHNIQUE: Multiplanar, multiecho pulse sequences of the brain and surrounding structures were  obtained without intravenous contrast. COMPARISON:  CT head, CTA head and neck today reported separately. Previous brain MRI elevn 06/27/2021. FINDINGS: Brain: No restricted diffusion or evidence of acute infarction. Numerous small chronic left cerebellar, left PICA territory infarcts are increased since 2023. Small new midline and contralateral right cerebellar hemisphere chronic infarcts also (series 10, images 7 and 8). The brainstem remains normal. And supratentorial gray and white matter signal is stable with only mild for age nonspecific white matter changes, minimal chronic microhemorrhage. Vascular: Distal vertebral and proximal basilar flow voids are abnormal concordant with the CTA findings today. See series 10, images 5 and 7. Other Major intracranial vascular flow voids are preserved. Skull and upper cervical spine: Negative. Sinuses/Orbits: Negative. Chronic  postoperative changes to both globes. Other: Mastoids are clear. Visible internal auditory structures appear normal. IMPRESSION: 1. Negative for acute infarct, despite abnormal distal Vertebral arteries and Basilar as detailed on CTA today separately. 2. Progressed since 2023 but chronic small infarcts in the left > right cerebellum. Brainstem signal remains normal. 3. Stable since 2023 and mild for age supratentorial signal changes compatible with small vessel disease. Electronically Signed   By: Marlise Simpers M.D.   On: 09/21/2023 12:34   CT ANGIO HEAD NECK W WO CM Result Date: 09/21/2023 CLINICAL DATA:  89 year old male with severe vertigo. Sudden onset dizziness 0830 hours. 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 EXAM: CT ANGIOGRAPHY HEAD AND NECK WITH CONTRAST TECHNIQUE: Multidetector CT imaging of the head and neck was performed using the standard protocol during bolus administration of intravenous contrast. Multiplanar CT image reconstructions and MIPs were obtained to evaluate the vascular anatomy. Carotid stenosis measurements (when applicable) are obtained utilizing NASCET criteria, using the distal internal carotid diameter as the denominator. RADIATION DOSE REDUCTION: This exam was performed according to the departmental dose-optimization program which includes automated exposure control, adjustment of the mA and/or kV according to patient size and/or use of iterative reconstruction technique. CONTRAST:  75mL OMNIPAQUE  IOHEXOL  350 MG/ML SOLN COMPARISON:  Head CT today 1028 hours. FINDINGS: CTA head and neck 01/09/2022.  CTA NECK Skeleton: Cervical spine degeneration with degenerative ankylosis. Hyperostotic related upper thoracic interbody ankylosis also. No acute osseous abnormality identified. Upper chest: Mild upper lung atelectasis. Negative visible superior mediastinum. Other neck: Nonvascular neck soft tissue spaces appear negative. Aortic arch: Calcified  aortic atherosclerosis. Bovine arch and the left vertebral artery also arises directly from the arch. Right carotid system: Brachiocephalic artery plaque and tortuosity with no significant stenosis through the right CCA origin. Soft and calcified right CCA plaque without stenosis before the bifurcation. However, superimposed bulky calcified plaque at the right carotid bifurcation with estimated 62% stenosis series 7, image 230, appears increased. Right ICA remains patent with additional calcified plaque at the bulb. Left carotid system: Bovine left CCA origin with mild tortuosity and mild plaque, no stenosis. Soft and calcified plaque in the left CCA before the bifurcation. Calcified plaque at the left ICA origin and bulb but no stenosis. Additional soft and calcified plaque of the left ICA just below the skull base is chronic, appears stable with 50 % stenosis with respect to the distal vessel on series 7, image 138. Vertebral arteries: Heavily calcified proximal right subclavian artery with tortuosity. The right vertebral artery origin is patent with calcified plaque but no stenosis. However, the right vertebral artery is diminutive, has a late entry into the cervical transverse foramen. And it appears occluded now at the level of the skull  base, in the V3 segment. Left vertebral artery arises directly from the arch and is chronically occluded in the V1 segment of the superior mediastinum. Reconstitution of the left V2 segment is maintained by muscular branches at the C3 vertebral level, not significantly changed. The left vertebral remains patent to the skull base. CTA HEAD Posterior circulation: Distal right vertebral artery is occluded which is new from 2023. Atherosclerotic left V4 segment is chronically stenotic, with critical stenosis at the vertebrobasilar junction that does not appear significantly changed from 2023 on series 7, image 134. Despite that the basilar artery is patent, atherosclerotic and  stenotic. The proximal basilar is somewhat poorly enhancing similar to 2023, but distal basilar stenosis has progressed and appears severe now on series 11, image 16. The basilar tip is chronically supplied by posterior communicating artery collaterals. It remains patent with robust enhancement. Bilateral posterior communicating artery and right P2 segment atherosclerosis. The right P2 segment stenosis is moderate to severe. Anterior circulation: Both ICA siphons are patent with calcified plaque. Heavily calcified left ICA cavernous and supraclinoid segment with moderate to severe stenosis at the anterior genu on series 8, image 42. Similar right siphon calcified plaque and distal cavernous, moderate to severe supraclinoid stenosis (series 8, image 50). Compared to 2023 these are mildly progressed. Despite that posterior communicating artery origins, carotid termini, MCA and ACA origins remain patent. Normal anterior communicating artery, bilateral PCA branches. Left MCA M1 segment and trifurcation are patent without stenosis. Right MCA M1 segment and bifurcation are patent without stenosis. Bilateral MCA branches are stable and within normal limits. Venous sinuses: Earlier contrast timing now. Superior sagittal sinus is patent. Anatomic variants: Bovine arch configuration and the dominant left vertebral artery arises directly from the arch. Review of the MIP images confirms the above findings IMPRESSION: 1. Pre-existing Critical atherosclerotic stenosis of the Vertebrobasilar junction, with New Occlusion of the distal Right Vertebral artery since the 2023 CTA. Chronic occlusion of the proximal Left Vertebral Artery but stable reconstitution. But subsequently decreased Basilar artery enhancement compared to 2023, and evidence of progressed underlying Basilar atherosclerosis, stenosis. Basilar tip chronically supplied from P-comm collaterals, stable. 2. Other fairly severe atherosclerosis in the Head and Neck  including: - Severe bilateral ICA siphon stenosis due to bulky calcified plaque, stable to progressed. - RICA origin stenosis estimated at 62%. - 50% stenosis LICA below the skull base. 3.  Aortic Atherosclerosis (ICD10-I70.0). Salient findings discussed by telephone with Dr. Renaee Caro, Neurology on 09/21/2023 at 12:05 . Electronically Signed   By: Marlise Simpers M.D.   On: 09/21/2023 12:08   CT HEAD CODE STROKE WO CONTRAST Result Date: 09/21/2023 CLINICAL DATA:  Code stroke.  89 year old male. EXAM: CT HEAD WITHOUT CONTRAST TECHNIQUE: Contiguous axial images were obtained from the base of the skull through the vertex without intravenous contrast. RADIATION DOSE REDUCTION: This exam was performed according to the departmental dose-optimization program which includes automated exposure control, adjustment of the mA and/or kV according to patient size and/or use of iterative reconstruction technique. COMPARISON:  Brain MRI and head CT 03/29/2022. FINDINGS: Brain: Stable cerebral volume since 2023. Small left inferior cerebellar infarcts have increased in number since 2023, but are circumscribed and chronic by CT (coronal images 59 through 61. Supratentorial gray-white differentiation is stable, normal for age. No midline shift, ventriculomegaly, mass effect, evidence of mass lesion, intracranial hemorrhage or evidence of cortically based acute infarction. Vascular: Extensive Calcified atherosclerosis at the skull base. No suspicious intracranial vascular hyperdensity. Skull: Stable and intact.  No acute osseous abnormality identified. Sinuses/Orbits: Visualized paranasal sinuses and mastoids are stable and well aerated. Other: No gaze deviation. Stable orbit and scalp soft tissues. Some calcified scalp vessel atherosclerosis. ASPECTS [facility] Stroke Program Early CT Score) Total score (0-10 with 10 being normal): 10 IMPRESSION: 1. No acute cortically based infarct or acute intracranial hemorrhage identified. ASPECTS 10. 2.  Increased number of chronic small left cerebellar infarcts since 2023. 3. These results were communicated to Dr. Lindzen at 10:36 am on 09/21/2023 by text page via the [facility] messaging system. Electronically Signed   By: Marlise Simpers M.D.   On: 09/21/2023 10:36    PROCEDURES and INTERVENTIONS:  .Critical Care  Performed by: Arline Bennett, MD Authorized by: Arline Bennett, MD   Critical care provider statement:    Critical care time (minutes):  30   Critical care time was exclusive of:  Separately billable procedures and treating other patients   Critical care was necessary to treat or prevent imminent or life-threatening deterioration of the following conditions:  CNS failure or compromise   Critical care was time spent personally by me on the following activities:  Development of treatment plan with patient or surrogate, discussions with consultants, evaluation of patient's response to treatment, examination of patient, ordering and review of laboratory studies, ordering and review of radiographic studies, ordering and performing treatments and interventions, pulse oximetry, re-evaluation of patient's condition and review of old charts .1-3 Lead EKG Interpretation  Performed by: Arline Bennett, MD Authorized by: Arline Bennett, MD     Interpretation: normal     ECG rate:  70   ECG rate assessment: normal     Rhythm: sinus rhythm     Ectopy: none     Conduction: normal     Medications  donepezil  (ARICEPT ) tablet 5 mg (has no administration in time range)   stroke: early stages of recovery book (has no administration in time range)  acetaminophen  (TYLENOL ) tablet 650 mg (has no administration in time range)    Or  acetaminophen  (TYLENOL ) 160 MG/5ML solution 650 mg (has no administration in time range)    Or  acetaminophen  (TYLENOL ) suppository 650 mg (has no administration in time range)  senna-docusate (Senokot-S) tablet 1 tablet (has no administration in time range)  hydrALAZINE  (APRESOLINE )  injection 5 mg (has no administration in time range)  0.9 %  sodium chloride  infusion (has no administration in time range)  aspirin  EC tablet 81 mg (has no administration in time range)  sodium chloride  flush (NS) 0.9 % injection 3 mL (3 mLs Intravenous Given 09/21/23 1033)  iohexol  (OMNIPAQUE ) 350 MG/ML injection 75 mL (75 mLs Intravenous Contrast Given 09/21/23 1130)     IMPRESSION / MDM / ASSESSMENT AND PLAN / ED COURSE  I reviewed the triage vital signs and the nursing notes.  Differential diagnosis includes, but is not limited to, TIA, CVA, hemorrhagic stroke, seizure, BPPV  {Patient presents with symptoms of an acute illness or injury that is potentially life-threatening.  Patient presents with sudden severe vertigo concerning for an acute stroke.  Symptoms improving and ultimately most concerning for a TIA.  CTA with posterior atherosclerotic disease.  MRI without acute CVA.  No ICH on CT.  No signs of metabolic derangements to precipitate this.  Essentially normal CBC and metabolic panel.  Consult medicine and neurology for admission.  Clinical Course as of 09/21/23 1305  Fri Sep 21, 2023  1050 I consult with Neurology, Dr. Renaee Caro [DS]  1109 reassessed [DS]  Clinical Course User Index [DS] Arline Bennett, MD     FINAL CLINICAL IMPRESSION(S) / ED DIAGNOSES   Final diagnoses:  Vertigo     Rx / DC Orders   ED Discharge Orders     None        Note:  This document was prepared using Dragon voice recognition software and may include unintentional dictation errors.   Arline Bennett, MD 09/21/23 [phone]

## 2023-09-21 NOTE — Progress Notes (Signed)
 1027 Code stroke cart activated, Pt in CT. LKW 0830 dizziness, ataxia- off balance when walking. BP 143/77 HR 69, glucose 109. Pt with h/o CVA- on plavix , HTN, Carotid stenosis.  1029 Dr Lindzen paged 1035 Dr Lindzen at bedside. 1039 NIHSS exam started  1048 No TNK per Dr Renaee Caro

## 2023-09-21 NOTE — Assessment & Plan Note (Signed)
 Neurology has been consulted and we appreciate further recommendations Complete echo Fasting lipid and A1c ordered Permissive hypertension per neurology recommendations Frequent neuro vascular checks N.p.o. pending swallow screen PT, OT, SLP Fall precaution

## 2023-09-21 NOTE — ED Notes (Signed)
 Blood work collected by [employer] and sent

## 2023-09-21 NOTE — Code Documentation (Signed)
 Stroke Response Nurse Documentation Code Documentation  SINAI RIVENBURGH is a 89 y.o. male arriving to [facility] via [employer] on 09/21/2023 with past medical hx of HTN, OSA on CPAP, carotid stenosis, pre-diabetes, dislipidemia, 1st degree AV block, cerebellar stroke. On clopidogrel  75 mg daily. Code stroke was activated by ED.   Patient from home where he was LKW at 0830 and now complaining of difficulty walking. Per EDP, patient was walking this morning when sudden difficulty walking. Was found by a neighbor. Brought to the ED.   Stroke team at the bedside after code stroke activation. Labs drawn and patient cleared for CT by Dr. Felipe Horton. Patient to CT with team. NIHSS performed by provider prior to [facility] arrival, see documentation for details and code stroke times. The following imaging was completed:  CT Head. Patient is not a candidate for IV Thrombolytic due to too mild to treat, per MD. Patient is not a candidate for IR due to exam not consistent with LVO, per MD.   Care Plan: every 30 minute NIHSS until patient outside window for TNK at 1300. Reactivate code stroke for worsening. Swallow screen per protocol.    Process Delays Noted: none  Bedside handoff with ED RN Therisa Flatten  Stroke Response RN

## 2023-09-21 NOTE — ED Notes (Signed)
 Dee RN and Engineer, maintenance at bedside in CT rm for IV access.

## 2023-09-21 NOTE — Progress Notes (Signed)
 CODE STROKE- PHARMACY COMMUNICATION   Time CODE STROKE called/page received: 1028  Time response to CODE STROKE was made (in person or via phone): 1032, in person  Time Stroke Kit retrieved from [employer] (only if needed): No thrombolytic per neurologist  Name of Provider/Nurse contacted: Dr. Lindzen  Past Medical History:  Diagnosis Date   Actinic keratosis    AV block, 1st degree 10/05/2021   With bradycardia 09/2021 - drop Toprol  XL to 25mg  daily, increase amlodipine  to 10mg  daily. Donepezil  can also cause AV block.      Carotid stenosis, asymptomatic, bilateral 09/16/2016   Diffuse 1-39% bilaterally, but involving ICA, ECA, CCA - rpt 1 yr (09/2016)   Complication of anesthesia    slow to wake up after a colonoscopy years ago   Diverticulitis    Dyslipidemia 09/04/2017   Atorvastatin  elevated CPK levels.      GERD (gastroesophageal reflux disease)    Gout    History of cerebellar stroke 01/09/2022   Cerebellar stroke     History of osteomyelitis 10/2015   left foot   History of squamous cell carcinoma of skin    HTN (hypertension)    Infection of left great toe due to methicillin resistant Staphylococcus aureus (MRSA) 2015   Hx s/p IV abx 7 wks through PICC (~2015)     MCI (mild cognitive impairment) 02/17/2020   MMSE (02/2020) 24/30, 25 with cue. Misses 3 orientation, 2 recall (1 with cue), 1 language. Independent ADLs and IADLs. 4/4 clock drawing test.   MMSE 08/2021 23/30, CDT 4/4     OSA on CPAP 09/01/2016   Osteoarthritis    back, neck, knees - multiple joints   Personal history of malignant neoplasm of larynx 2009   s/p XRT, Skin Cancer- "pre cancerous"   Pre-diabetes    Prediabetes 01/09/2022   Seasonal allergies    Sensorineural hearing loss (SNHL) of both ears 09/01/2016   Sinus bradycardia 02/07/2022   Sinus bradycardia     Squamous cell carcinoma of skin unknown   Treated in [place] - SCC of the L post auricular    Squamous cell carcinoma of skin 10/26/2022    R med knee, ED[facility]   Prior to Admission medications   Medication Sig Start Date End Date Taking? Authorizing Provider  Acetaminophen  Extra Strength 500 MG CAPS Take by mouth daily as needed. OTC    [provider]  amLODipine  (NORVASC ) 10 MG tablet Take 1 tablet (10 mg total) by mouth daily. 04/13/23   Claire Crick, MD  azelastine  (ASTELIN ) 0.1 % nasal spray Place 1 spray into both nostrils 2 (two) times daily. Use in each nostril as directed 04/13/23   Claire Crick, MD  cetirizine (ZYRTEC) 10 MG tablet Take 10 mg by mouth daily. Alternates weekly with allegra. OTC    [provider]  clopidogrel  (PLAVIX ) 75 MG tablet Take 1 tablet (75 mg total) by mouth daily. 04/13/23   Claire Crick, MD  Coenzyme Q10-Vitamin E (QUNOL ULTRA COQ10 PO) Take 1 capsule by mouth daily. OTC    [provider]  cyanocobalamin  (VITAMIN B12) 1000 MCG tablet Take 1 tablet (1,000 mcg total) by mouth daily. Patient taking differently: Take 1,000 mcg by mouth daily. OTC 12/05/22   Claire Crick, MD  donepezil  (ARICEPT ) 5 MG tablet Take 1 tablet (5 mg total) by mouth at bedtime. 04/13/23   Claire Crick, MD  fexofenadine (ALLEGRA) 180 MG tablet Take 180 mg by mouth daily. Alternates weekly with zyrtec. OTC  [provider]  hydrALAZINE  (APRESOLINE ) 50 MG tablet Take 1 tablet (50 mg total) by mouth 3 (three) times daily. 04/13/23   Claire Crick, MD  losartan  (COZAAR ) 50 MG tablet Take 1 tablet (50 mg total) by mouth daily. 04/13/23   Claire Crick, MD  Magnesium  400 MG TABS Take 1 tablet by mouth daily. OTC    [provider]  Misc Natural Products (OSTEO BI-FLEX TRIPLE STRENGTH) TABS Take 1 tablet by mouth daily. + vitamin D . OTC    [provider]  Multiple Vitamins-Minerals (ONE-A-DAY MENS 50+) TABS Take 1 tablet by mouth daily. OTC    [provider]  pantoprazole  (PROTONIX ) 20 MG tablet Take 1 tablet (20 mg total) by mouth daily.  04/13/23   Claire Crick, MD  polyethylene glycol [facility] / GLYCOLAX) packet Take 8.5 g by mouth daily.     [provider]  pravastatin  (PRAVACHOL ) 20 MG tablet Take 1 tablet (20 mg total) by mouth 2 (two) times a week. 04/16/23   Claire Crick, MD  triamcinolone  cream (KENALOG ) 0.1 % Apply to affected areas rash on right hip twice daily until improved. Avoid applying to face, groin, and axilla. Use as directed. Long-term use can cause thinning of the skin. 04/19/23   Elta Halter, MD    Will M. Alva Jewels, PharmD Clinical Pharmacist 09/21/2023 10:55 AM

## 2023-09-21 NOTE — ED Triage Notes (Signed)
 PT from home via POV- went for a mile walk this am and was fine, at 0830 had a sudden onset of dizziness and is now unable to walk d/t being off balance. Pt has hx of CVA.   CBG 109

## 2023-09-22 DIAGNOSIS — G4733 Obstructive sleep apnea (adult) (pediatric): Secondary | ICD-10-CM

## 2023-09-22 DIAGNOSIS — K219 Gastro-esophageal reflux disease without esophagitis: Secondary | ICD-10-CM

## 2023-09-22 DIAGNOSIS — R42 Dizziness and giddiness: Principal | ICD-10-CM

## 2023-09-22 DIAGNOSIS — G3184 Mild cognitive impairment, so stated: Secondary | ICD-10-CM

## 2023-09-22 DIAGNOSIS — E785 Hyperlipidemia, unspecified: Secondary | ICD-10-CM | POA: Diagnosis not present

## 2023-09-22 DIAGNOSIS — Z8673 Personal history of transient ischemic attack (TIA), and cerebral infarction without residual deficits: Secondary | ICD-10-CM

## 2023-09-22 DIAGNOSIS — R2990 Unspecified symptoms and signs involving the nervous system: Secondary | ICD-10-CM | POA: Diagnosis not present

## 2023-09-22 LAB — LIPID PANEL
Cholesterol: 123 mg/dL (ref 0–200)
HDL: 39 mg/dL — ABNORMAL LOW (ref >40)
LDL Cholesterol: 63 mg/dL (ref 0–99)
Total CHOL/HDL Ratio: 3.2 ratio
Triglycerides: 107 mg/dL (ref <150)
VLDL: 21 mg/dL (ref 0–40)

## 2023-09-22 LAB — URINE DRUG SCREEN, QUALITATIVE (ARMC ONLY)
Amphetamines, Ur Screen: NOT DETECTED
Barbiturates, Ur Screen: NOT DETECTED
Benzodiazepine, Ur Scrn: NOT DETECTED
Cannabinoid 50 Ng, Ur ~~LOC~~: NOT DETECTED
Cocaine Metabolite,Ur ~~LOC~~: NOT DETECTED
MDMA (Ecstasy)Ur Screen: NOT DETECTED
Methadone Scn, Ur: NOT DETECTED
Opiate, Ur Screen: NOT DETECTED
Phencyclidine (PCP) Ur S: NOT DETECTED
Tricyclic, Ur Screen: NOT DETECTED

## 2023-09-22 MED ORDER — ASPIRIN 81 MG PO TBEC: 81.0000 mg | DELAYED_RELEASE_TABLET | Freq: Every day | ORAL | 12 refills | Status: AC

## 2023-09-22 MED ORDER — LOSARTAN POTASSIUM 50 MG PO TABS
50.0000 mg | ORAL_TABLET | Freq: Every day | ORAL | Status: DC
  Administered 2023-09-22: 50 mg via ORAL
  Filled 2023-09-22: qty 1

## 2023-09-22 MED ORDER — AMLODIPINE BESYLATE 10 MG PO TABS
10.0000 mg | ORAL_TABLET | Freq: Every day | ORAL | Status: DC
  Administered 2023-09-22: 10 mg via ORAL
  Filled 2023-09-22: qty 1

## 2023-09-22 NOTE — Progress Notes (Signed)
 Occupational Therapy Treatment Patient Details Name: [name] MRN: [id number] DOB: 11/26/1931 Today's Date: 09/22/2023   History of present illness 89 y.o. male with a PMHx of cerebellar stroke (manifested with dizziness and unsteadiness), mild cognitive impairment, OSA on CPAP, prediabetes, squamous cell carcinoma of the skin, 1st degree AV block, bilateral ICA stenosis, dyslipidemia, left foot osteomyelitis and HTN who presents to the ED for evaluation after he experienced acute onset of dizziness and gait unsteadiness while on a walk this morning. He describes the dizziness as a mix of vertigo and a sensation as though on a rocking boat or being drunk. He was so unsteady that he had to stop his walk and lean against a sign. A neighbor found him and assisted him back to his home. He then presented to the ED via POV.   OT comments  Pt seen for OT Tx. Pt ambulating himself back from bathroom holding onto the door/counter for stability. Pt educated on need for RW 2/2 falls risk. Pt/spouse instructed in [facility] activities and exercises with pt able to return demo wtih VC for technique. Pt/spouse instructed in BUE therex using red theraband with emphasis on shoulder strengthening, requiring multimodal cues for sequencing/technique. Handout for both [facility] and strengthening provided to support recall and carryover. Pt left seated EOB with spouse and MD. Pt continues to demonstrate decreased balance, L shoulder strength, and L FMC and will benefit from skilled OT services.         Equipment Recommendations  Other (comment) (2WW)       Precautions / Restrictions Precautions Precautions: Fall Recall of Precautions/Restrictions: Intact Restrictions Weight Bearing Restrictions Per Provider Order: No       Mobility Bed Mobility Overal bed mobility: Modified Independent     Transfers Overall transfer level: Needs assistance Equipment used: Rolling walker (2 wheels) Transfers: Sit to/from  Stand Sit to Stand: Supervision      Balance Overall balance assessment: Needs assistance Sitting-balance support: No upper extremity supported, Feet supported Sitting balance-Leahy Scale: Fair Sitting balance - Comments: slight posterior lean and compensates by sticking his LLE out in front of him, VC to correct Postural control: Posterior lean Standing balance support: Single extremity supported, Bilateral upper extremity supported Standing balance-Leahy Scale: Fair        ADL either performed or assessed with clinical judgement   ADL Overall ADL's : Needs assistance/impaired    Toilet Transfer: Radiographer, therapeutic Details (indicate cue type and reason): remote supv (spouse in room, pt went to bathroom himself) Toileting- Architect and Hygiene: Modified independent        Communication Communication Communication: Impaired Factors Affecting Communication: Hearing impaired   Cognition Arousal: Alert Behavior During Therapy: WFL for tasks assessed/performed      OT - Cognition Comments: Spouse notes that pt is becoming more forgetful; decr safety awareness        Following commands: Impaired Following commands impaired: Follows one step commands with increased time, Follows multi-step commands inconsistently      Cueing   Cueing Techniques: Verbal cues, Tactile cues  Exercises Other Exercises Other Exercises: Pt/spouse instructed in [facility] activities and exercises with pt able to return demo wtih VC for technique Other Exercises: Pt/spouse instructed in BUE therex using red theraband with emphasis on shoulder strengthening, requiring multimodal cues for sequencing/technique          Pertinent Vitals/ Pain       Pain Assessment Pain Assessment: No/denies pain   Frequency  Min 2X/week  Progress Toward Goals  OT Goals(current goals can now be found in the care plan section)  Progress towards OT goals: Progressing toward  goals  Acute Rehab OT Goals Patient Stated Goal: get better and go home OT Goal Formulation: With patient/family Time For Goal Achievement: 10/05/23 Potential to Achieve Goals: Good   AM-PAC OT "6 Clicks" Daily Activity     Outcome Measure   Help from another person eating meals?: None Help from another person taking care of personal grooming?: A Little Help from another person toileting, which includes using toliet, bedpan, or urinal?: None Help from another person bathing (including washing, rinsing, drying)?: A Little Help from another person to put on and taking off regular upper body clothing?: None Help from another person to put on and taking off regular lower body clothing?: A Little 6 Click Score: 21    End of Session    OT Visit Diagnosis: Other abnormalities of gait and mobility (R26.89);History of falling (Z91.81);Hemiplegia and hemiparesis Hemiplegia - Right/Left: Left Hemiplegia - dominant/non-dominant: Non-Dominant Hemiplegia - caused by: Unspecified   Activity Tolerance Patient tolerated treatment well   Patient Left in bed;with call bell/phone within reach;with family/visitor present;Other (comment) (sitting EOB with MD in room)   Nurse Communication Other (comment) (needs OP OT/PT referrals)        Time: 1610-9604 OT Time Calculation (min): 32 min  Charges: OT General Charges $OT Visit: 1 Visit OT Treatments $Neuromuscular Re-education: 8-22 mins $Therapeutic Exercise: 8-22 mins  Berenda Breaker., MPH, MS, OTR/L ascom [phone] 09/22/23, 12:33 PM

## 2023-09-22 NOTE — Care Management Obs Status (Signed)
 MEDICARE OBSERVATION STATUS NOTIFICATION   Patient Details  Name: [name] MRN: [id number] Date of Birth: 1932/03/13   Medicare Observation Status Notification Given:  Yes    [place] L Valli Randol, LCSW 09/22/2023, 9:45 AM

## 2023-09-22 NOTE — Discharge Summary (Signed)
 Physician Discharge Summary   Patient: [name] MRN: [id number] DOB: 1932-03-25  Admit date:     09/21/2023  Discharge date: 09/22/23  Discharge Physician: Luna Salinas   PCP: Claire Crick, MD   Recommendations at discharge:  Please obtain CBC and BMP on follow-up Follow-up with primary care provider Follow-up with outpatient neurology  Discharge Diagnoses: Principal Problem:   Stroke-like symptom Active Problems:   GERD (gastroesophageal reflux disease)   HTN (hypertension)   OSA on CPAP   Dyslipidemia   MCI (mild cognitive impairment)   History of cerebellar stroke   Vertigo   Hospital Course: [name] is an 89 year old male with history of hypertension, history of CVA, mild cognitive decline, GERD, anemia, who presents emergency department for chief concerns of dizziness and inability to walk.  Vitals in the ED showed temperature of 97.5, respiration of 18, heart rate 69, blood pressure 143/77, SpO2 96% on room air.  Serum sodium is 137, potassium 4.3, chloride 104, bicarb 25, BUN of 23, serum creatinine 1.09, EGFR greater than 60, nonfasting glucose 98, WBC 5.6, hemoglobin 12.3, platelets of 250  Code stroke was called initially but CT head and MRI brain was negative for any acute infarct.  Did show progressive chronic small vessel infarcts in the left more than right cerebellum. CTA head and neck with chronic critical atherosclerotic stenosis of vertebrobasilar junction with new occlusion of the distal right vertebral artery since the 2023 CTA.  Chronic occlusion of proximal left vertebral artery but subtle reconstitution.  Evidence of progressed underlying basilar atherosclerosis and stenosis.  Also noted severe bilateral ICA significant stenosis.  62% stenosis of right ICA and 50% stenosis of left ICA.  5/17: Vital stable with mildly elevated blood pressure at 152/57.  Echocardiogram with normal EF and grade 1 diastolic dysfunction, no other  significant abnormality except borderline dilatation of ascending aorta.  Lipid profile with HDL of 39 and LDL of 63.  Neurology recommended adding aspirin  to Plavix  and outpatient follow-up with neurology.  Patient to have significant atherosclerosis but surgical intervention with increased risk which outweighs the benefit at this advanced age.  Patient should avoid hypotension to decrease the risk of brain hypoperfusion.  PT recommended outpatient physical therapy.  Patient will continue on current medications and need to have a close follow-up with his providers for further assistance.   Consultants: Neurology Procedures performed: None Disposition: Home Diet recommendation:  Discharge Diet Orders (From admission, onward)     Start     Ordered   09/22/23 0000  Diet - low sodium heart healthy        09/22/23 1110           Cardiac diet DISCHARGE MEDICATION: Allergies as of 09/22/2023       Reactions   Lisinopril Cough   Atorvastatin  Other (See Comments)   Myalgias and mildly elevated CPK         Medication List     TAKE these medications    amLODipine  10 MG tablet Commonly known as: NORVASC  Take 1 tablet (10 mg total) by mouth daily.   aspirin  EC 81 MG tablet Take 1 tablet (81 mg total) by mouth daily. Swallow whole. Start taking on: Sep 23, 2023   azelastine  0.1 % nasal spray Commonly known as: ASTELIN  Place 1 spray into both nostrils 2 (two) times daily. Use in each nostril as directed   cetirizine 10 MG tablet Commonly known as: ZYRTEC Take 10 mg by mouth daily. Alternates weekly with  allegra. OTC   clopidogrel  75 MG tablet Commonly known as: PLAVIX  Take 1 tablet (75 mg total) by mouth daily.   cyanocobalamin  1000 MCG tablet Commonly known as: VITAMIN B12 Take 1 tablet (1,000 mcg total) by mouth daily. What changed: additional instructions   donepezil  5 MG tablet Commonly known as: ARICEPT  Take 1 tablet (5 mg total) by mouth at bedtime.    fexofenadine 180 MG tablet Commonly known as: ALLEGRA Take 180 mg by mouth daily. Alternates weekly with zyrtec. OTC   hydrALAZINE  50 MG tablet Commonly known as: APRESOLINE  Take 1 tablet (50 mg total) by mouth 3 (three) times daily.   losartan  50 MG tablet Commonly known as: COZAAR  Take 1 tablet (50 mg total) by mouth daily.   Magnesium  400 MG Tabs Take 1 tablet by mouth daily. OTC   One-A-Day Mens 50+ Tabs Take 1 tablet by mouth daily. OTC   Osteo Bi-Flex Triple Strength Tabs Take 1 tablet by mouth daily. + vitamin D . OTC   pantoprazole  20 MG tablet Commonly known as: PROTONIX  Take 1 tablet (20 mg total) by mouth daily.   polyethylene glycol 17 g packet Commonly known as: MIRALAX  / GLYCOLAX  Take 8.5 g by mouth daily.   pravastatin  20 MG tablet Commonly known as: PRAVACHOL  Take 1 tablet (20 mg total) by mouth 2 (two) times a week.   QUNOL ULTRA COQ10 PO Take 1 capsule by mouth daily. OTC   triamcinolone  cream 0.1 % Commonly known as: KENALOG  Apply to affected areas rash on right hip twice daily until improved. Avoid applying to face, groin, and axilla. Use as directed. Long-term use can cause thinning of the skin.               Durable Medical Equipment  (From admission, onward)           Start     Ordered   09/22/23 1105  For home use only DME 4 wheeled rolling walker with seat  Once       Question:  Patient needs a walker to treat with the following condition  Answer:  Generalized weakness   09/22/23 1104            Follow-up Information     Claire Crick, MD Follow up.   Specialty: Family Medicine Why: Hospital follow up Contact information: [address] [place] [address] [phone]                Discharge Exam: [name] Weights   09/21/23 1025  Weight: 92 kg   General.  Frail elderly man, in no acute distress. Pulmonary.  Lungs clear bilaterally, normal respiratory effort. CV.  Regular rate and rhythm,  no JVD, rub or murmur. Abdomen.  Soft, nontender, nondistended, BS positive. CNS.  Alert and oriented .  No focal neurologic deficit. Extremities.  No edema, no cyanosis, pulses intact and symmetrical.  Condition at discharge: stable  The results of significant diagnostics from this hospitalization (including imaging, microbiology, ancillary and laboratory) are listed below for reference.   Imaging Studies: ECHOCARDIOGRAM COMPLETE Result Date: 09/21/2023    ECHOCARDIOGRAM REPORT   Patient Name:   [name] Date of Exam: 09/21/2023 Medical Rec #:  [id number]        Height:       69.0 in Accession #:    [id number]       Weight:       202.8 lb Date of Birth:  1932-02-26  BSA:          2.078 m Patient Age:    89 years         BP:           163/74 mmHg Patient Gender: M                HR:           58 bpm. Exam Location:  [facility] Procedure: 2D Echo, Cardiac Doppler and Color Doppler (Both Spectral and Color            Flow Doppler were utilized during procedure). Indications:     TIA G45.9  History:         Patient has prior history of Echocardiogram examinations, most                  recent 01/09/2022. Stroke, Arrythmias:Bradycardia; Risk                  Factors:Hypertension, Sleep Apnea and Dyslipidemia.  Sonographer:     Terrilee Few RCS Referring Phys:  [id number] AMY N COX Diagnosing Phys: Constancia Delton MD IMPRESSIONS  1. Left ventricular ejection fraction, by estimation, is 55 to 60%. The left ventricle has normal function. The left ventricle has no regional wall motion abnormalities. There is mild left ventricular hypertrophy. Left ventricular diastolic parameters are consistent with Grade I diastolic dysfunction (impaired relaxation).  2. Right ventricular systolic function is normal. The right ventricular size is normal.  3. The mitral valve is normal in structure. Mild mitral valve regurgitation.  4. The aortic valve is grossly normal. Aortic valve regurgitation is not visualized.  5.  Aortic dilatation noted. There is borderline dilatation of the ascending aorta, measuring 38 mm. FINDINGS  Left Ventricle: Left ventricular ejection fraction, by estimation, is 55 to 60%. The left ventricle has normal function. The left ventricle has no regional wall motion abnormalities. The left ventricular internal cavity size was normal in size. There is  mild left ventricular hypertrophy. Left ventricular diastolic parameters are consistent with Grade I diastolic dysfunction (impaired relaxation). Right Ventricle: The right ventricular size is normal. No increase in right ventricular wall thickness. Right ventricular systolic function is normal. Left Atrium: Left atrial size was normal in size. Right Atrium: Right atrial size was normal in size. Pericardium: There is no evidence of pericardial effusion. Mitral Valve: The mitral valve is normal in structure. Mild mitral valve regurgitation. Tricuspid Valve: The tricuspid valve is normal in structure. Tricuspid valve regurgitation is not demonstrated. Aortic Valve: The aortic valve is grossly normal. Aortic valve regurgitation is not visualized. Aortic valve peak gradient measures 5.6 mmHg. Pulmonic Valve: The pulmonic valve was normal in structure. Pulmonic valve regurgitation is not visualized. Aorta: Aortic dilatation noted. There is borderline dilatation of the ascending aorta, measuring 38 mm. Venous: The inferior vena cava was not well visualized. IAS/Shunts: No atrial level shunt detected by color flow Doppler.  LEFT VENTRICLE PLAX 2D LVIDd:         5.10 cm   Diastology LVIDs:         3.50 cm   LV e' medial:    5.77 cm/s LV PW:         1.20 cm   LV E/e' medial:  13.5 LV IVS:        1.30 cm   LV e' lateral:   6.85 cm/s LVOT diam:     2.40 cm   LV E/e' lateral: 11.4 LV SV:  101 LV SV Index:   49 LVOT Area:     4.52 cm  RIGHT VENTRICLE RV S prime:     12.60 cm/s TAPSE (M-mode): 1.9 cm LEFT ATRIUM             Index        RIGHT ATRIUM           Index  LA diam:        4.00 cm 1.92 cm/m   RA Area:     18.00 cm LA Vol (A2C):   45.6 ml 21.94 ml/m  RA Volume:   44.20 ml  21.27 ml/m LA Vol (A4C):   58.9 ml 28.34 ml/m LA Biplane Vol: 56.5 ml 27.19 ml/m  AORTIC VALVE AV Area (Vmax): 3.91 cm AV Vmax:        118.00 cm/s AV Peak Grad:   5.6 mmHg LVOT Vmax:      102.00 cm/s LVOT Vmean:     65.000 cm/s LVOT VTI:       0.223 m  AORTA Ao Root diam: 3.60 cm Ao Asc diam:  3.80 cm MITRAL VALVE MV Area (PHT): 2.83 cm     SHUNTS MV Decel Time: 268 msec     Systemic VTI:  0.22 m MV E velocity: 78.10 cm/s   Systemic Diam: 2.40 cm MV A velocity: 102.00 cm/s MV E/A ratio:  0.77 Constancia Delton MD Electronically signed by Constancia Delton MD Signature Date/Time: 09/21/2023/5:13:23 PM    Final    MR BRAIN WO CONTRAST Result Date: 09/21/2023 CLINICAL DATA:  89 year old male code stroke presentation, chronically but progressively abnormal vertebrobasilar system on CTA today. EXAM: MRI HEAD WITHOUT CONTRAST TECHNIQUE: Multiplanar, multiecho pulse sequences of the brain and surrounding structures were obtained without intravenous contrast. COMPARISON:  CT head, CTA head and neck today reported separately. Previous brain MRI elevn 06/27/2021. FINDINGS: Brain: No restricted diffusion or evidence of acute infarction. Numerous small chronic left cerebellar, left PICA territory infarcts are increased since 2023. Small new midline and contralateral right cerebellar hemisphere chronic infarcts also (series 10, images 7 and 8). The brainstem remains normal. And supratentorial gray and white matter signal is stable with only mild for age nonspecific white matter changes, minimal chronic microhemorrhage. Vascular: Distal vertebral and proximal basilar flow voids are abnormal concordant with the CTA findings today. See series 10, images 5 and 7. Other Major intracranial vascular flow voids are preserved. Skull and upper cervical spine: Negative. Sinuses/Orbits: Negative. Chronic  postoperative changes to both globes. Other: Mastoids are clear. Visible internal auditory structures appear normal. IMPRESSION: 1. Negative for acute infarct, despite abnormal distal Vertebral arteries and Basilar as detailed on CTA today separately. 2. Progressed since 2023 but chronic small infarcts in the left > right cerebellum. Brainstem signal remains normal. 3. Stable since 2023 and mild for age supratentorial signal changes compatible with small vessel disease. Electronically Signed   By: Marlise Simpers M.D.   On: 09/21/2023 12:34   CT ANGIO HEAD NECK W WO CM Result Date: 09/21/2023 CLINICAL DATA:  89 year old male with severe vertigo. Sudden onset dizziness 0830 hours. 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 EXAM: CT ANGIOGRAPHY HEAD AND NECK WITH CONTRAST TECHNIQUE: Multidetector CT imaging of the head and neck was performed using the standard protocol during bolus administration of intravenous contrast. Multiplanar CT image reconstructions and MIPs were obtained to evaluate the vascular anatomy. Carotid stenosis measurements (when applicable) are obtained utilizing NASCET criteria, using the distal internal carotid  diameter as the denominator. RADIATION DOSE REDUCTION: This exam was performed according to the departmental dose-optimization program which includes automated exposure control, adjustment of the mA and/or kV according to patient size and/or use of iterative reconstruction technique. CONTRAST:  75mL OMNIPAQUE  IOHEXOL  350 MG/ML SOLN COMPARISON:  Head CT today 1028 hours. FINDINGS: CTA head and neck 01/09/2022.  CTA NECK Skeleton: Cervical spine degeneration with degenerative ankylosis. Hyperostotic related upper thoracic interbody ankylosis also. No acute osseous abnormality identified. Upper chest: Mild upper lung atelectasis. Negative visible superior mediastinum. Other neck: Nonvascular neck soft tissue spaces appear negative. Aortic arch: Calcified  aortic atherosclerosis. Bovine arch and the left vertebral artery also arises directly from the arch. Right carotid system: Brachiocephalic artery plaque and tortuosity with no significant stenosis through the right CCA origin. Soft and calcified right CCA plaque without stenosis before the bifurcation. However, superimposed bulky calcified plaque at the right carotid bifurcation with estimated 62% stenosis series 7, image 230, appears increased. Right ICA remains patent with additional calcified plaque at the bulb. Left carotid system: Bovine left CCA origin with mild tortuosity and mild plaque, no stenosis. Soft and calcified plaque in the left CCA before the bifurcation. Calcified plaque at the left ICA origin and bulb but no stenosis. Additional soft and calcified plaque of the left ICA just below the skull base is chronic, appears stable with 50 % stenosis with respect to the distal vessel on series 7, image 138. Vertebral arteries: Heavily calcified proximal right subclavian artery with tortuosity. The right vertebral artery origin is patent with calcified plaque but no stenosis. However, the right vertebral artery is diminutive, has a late entry into the cervical transverse foramen. And it appears occluded now at the level of the skull base, in the V3 segment. Left vertebral artery arises directly from the arch and is chronically occluded in the V1 segment of the superior mediastinum. Reconstitution of the left V2 segment is maintained by muscular branches at the C3 vertebral level, not significantly changed. The left vertebral remains patent to the skull base. CTA HEAD Posterior circulation: Distal right vertebral artery is occluded which is new from 2023. Atherosclerotic left V4 segment is chronically stenotic, with critical stenosis at the vertebrobasilar junction that does not appear significantly changed from 2023 on series 7, image 134. Despite that the basilar artery is patent, atherosclerotic and  stenotic. The proximal basilar is somewhat poorly enhancing similar to 2023, but distal basilar stenosis has progressed and appears severe now on series 11, image 16. The basilar tip is chronically supplied by posterior communicating artery collaterals. It remains patent with robust enhancement. Bilateral posterior communicating artery and right P2 segment atherosclerosis. The right P2 segment stenosis is moderate to severe. Anterior circulation: Both ICA siphons are patent with calcified plaque. Heavily calcified left ICA cavernous and supraclinoid segment with moderate to severe stenosis at the anterior genu on series 8, image 42. Similar right siphon calcified plaque and distal cavernous, moderate to severe supraclinoid stenosis (series 8, image 50). Compared to 2023 these are mildly progressed. Despite that posterior communicating artery origins, carotid termini, MCA and ACA origins remain patent. Normal anterior communicating artery, bilateral PCA branches. Left MCA M1 segment and trifurcation are patent without stenosis. Right MCA M1 segment and bifurcation are patent without stenosis. Bilateral MCA branches are stable and within normal limits. Venous sinuses: Earlier contrast timing now. Superior sagittal sinus is patent. Anatomic variants: Bovine arch configuration and the dominant left vertebral artery arises directly from the arch. Review of  the MIP images confirms the above findings IMPRESSION: 1. Pre-existing Critical atherosclerotic stenosis of the Vertebrobasilar junction, with New Occlusion of the distal Right Vertebral artery since the 2023 CTA. Chronic occlusion of the proximal Left Vertebral Artery but stable reconstitution. But subsequently decreased Basilar artery enhancement compared to 2023, and evidence of progressed underlying Basilar atherosclerosis, stenosis. Basilar tip chronically supplied from P-comm collaterals, stable. 2. Other fairly severe atherosclerosis in the Head and Neck  including: - Severe bilateral ICA siphon stenosis due to bulky calcified plaque, stable to progressed. - RICA origin stenosis estimated at 62%. - 50% stenosis LICA below the skull base. 3.  Aortic Atherosclerosis (ICD10-I70.0). Salient findings discussed by telephone with Dr. Renaee Caro, Neurology on 09/21/2023 at 12:05 . Electronically Signed   By: Marlise Simpers M.D.   On: 09/21/2023 12:08   CT HEAD CODE STROKE WO CONTRAST Result Date: 09/21/2023 CLINICAL DATA:  Code stroke.  89 year old male. EXAM: CT HEAD WITHOUT CONTRAST TECHNIQUE: Contiguous axial images were obtained from the base of the skull through the vertex without intravenous contrast. RADIATION DOSE REDUCTION: This exam was performed according to the departmental dose-optimization program which includes automated exposure control, adjustment of the mA and/or kV according to patient size and/or use of iterative reconstruction technique. COMPARISON:  Brain MRI and head CT 03/29/2022. FINDINGS: Brain: Stable cerebral volume since 2023. Small left inferior cerebellar infarcts have increased in number since 2023, but are circumscribed and chronic by CT (coronal images 59 through 61. Supratentorial gray-white differentiation is stable, normal for age. No midline shift, ventriculomegaly, mass effect, evidence of mass lesion, intracranial hemorrhage or evidence of cortically based acute infarction. Vascular: Extensive Calcified atherosclerosis at the skull base. No suspicious intracranial vascular hyperdensity. Skull: Stable and intact.  No acute osseous abnormality identified. Sinuses/Orbits: Visualized paranasal sinuses and mastoids are stable and well aerated. Other: No gaze deviation. Stable orbit and scalp soft tissues. Some calcified scalp vessel atherosclerosis. ASPECTS [facility] Stroke Program Early CT Score) Total score (0-10 with 10 being normal): 10 IMPRESSION: 1. No acute cortically based infarct or acute intracranial hemorrhage identified. ASPECTS 10. 2.  Increased number of chronic small left cerebellar infarcts since 2023. 3. These results were communicated to Dr. Lindzen at 10:36 am on 09/21/2023 by text page via the [facility] messaging system. Electronically Signed   By: Marlise Simpers M.D.   On: 09/21/2023 10:36    Microbiology: Results for orders placed or performed during the hospital encounter of 12/10/22  SARS Coronavirus 2 by RT PCR (hospital order, performed in [facility] hospital lab) *cepheid single result test* Anterior Nasal Swab     Status: None   Collection Time: 12/10/22  1:39 PM   Specimen: Anterior Nasal Swab  Result Value Ref Range Status   SARS Coronavirus 2 by RT PCR NEGATIVE NEGATIVE Final    Comment: (NOTE) SARS-CoV-2 target nucleic acids are NOT DETECTED.  The SARS-CoV-2 RNA is generally detectable in upper and lower respiratory specimens during the acute phase of infection. The lowest concentration of SARS-CoV-2 viral copies this assay can detect is 250 copies / mL. A negative result does not preclude SARS-CoV-2 infection and should not be used as the sole basis for treatment or other patient management decisions.  A negative result may occur with improper specimen collection / handling, submission of specimen other than nasopharyngeal swab, presence of viral mutation(s) within the areas targeted by this assay, and inadequate number of viral copies (<250 copies / mL). A negative result must be combined with clinical  observations, patient history, and epidemiological information.  Fact Sheet for Patients:   RoadLapTop.co.za  Fact Sheet for Healthcare Providers: http://kim-miller.com/  This test is not yet approved or  cleared by the [place]  FDA and has been authorized for detection and/or diagnosis of SARS-CoV-2 by FDA under an Emergency Use Authorization (EUA).  This EUA will remain in effect (meaning this test can be used) for the duration of the COVID-19 declaration  under Section 564(b)(1) of the Act, 21 U.S.C. section 360bbb-3(b)(1), unless the authorization is terminated or revoked sooner.  Performed at [facility], [address] [place], [place] [address]     Labs: CBC: Recent Labs  Lab 09/21/23 1035  WBC 5.6  NEUTROABS 3.8  HGB 12.3*  HCT 36.4*  MCV 95.8  PLT 250   Basic Metabolic Panel: Recent Labs  Lab 09/21/23 1035  NA 137  K 4.3  CL 104  CO2 25  GLUCOSE 98  BUN 23  CREATININE 1.09  CALCIUM  8.6*   Liver Function Tests: Recent Labs  Lab 09/21/23 1035  AST 27  ALT 21  ALKPHOS 42  BILITOT 0.8  PROT 6.7  ALBUMIN 3.9   CBG: Recent Labs  Lab 09/21/23 1021  GLUCAP 109*    Discharge time spent: greater than 30 minutes.  This record has been created using Conservation officer, historic buildings. Errors have been sought and corrected,but may not always be located. Such creation errors do not reflect on the standard of care.   Signed: Luna Salinas, MD [facility] 09/22/2023

## 2023-09-22 NOTE — Progress Notes (Signed)
 Dropped off and educATED  PT AND wife at bedside on HCPOA wife unsure if they will complete it here-but is aware of how to reach us  if she chooses too.

## 2023-09-22 NOTE — Evaluation (Signed)
 Physical Therapy Evaluation Patient Details Name: [name] MRN: [id number] DOB: 06-Jul-1931 Today's Date: 09/22/2023  History of Present Illness  89 y.o. male with a PMHx of cerebellar stroke (manifested with dizziness and unsteadiness), mild cognitive impairment, OSA on CPAP, prediabetes, squamous cell carcinoma of the skin, 1st degree AV block, bilateral ICA stenosis, dyslipidemia, left foot osteomyelitis and HTN who presents to the ED for evaluation after he experienced acute onset of dizziness and gait unsteadiness while on a walk this morning. He describes the dizziness as a mix of vertigo and a sensation as though on a rocking boat or being drunk. He was so unsteady that he had to stop his walk and lean against a sign. A neighbor found him and assisted him back to his home. He then presented to the ED via POV. MRI (-) for acute infarct, despite abnormal distal Vertebral arteries and Basilar as detailed on CTA today separately.Progressed since 2023 but chronic small infarcts in the left > right cerebellum.   Clinical Impression  89 yo Male came to ED with increased dizziness and unsteadiness. He lives at home with his wife. Patient reports being independent in all self care ADLs prior to admittance. He was active and walking daily without assistive device. He does report 1 fall in last 6 months, out in the yard he was cleaning up and fell over in the bushes. Currently, patient demonstrates mod I for bed mobility. He can transfer sit<>stand from regular height chair without arm rests. 5 times sit<>Stand without arm rest is 14.6 sec indicating low risk for falls. When patient initially stands and tries to walk, he does demonstrate a narrow base of support and slight unsteadiness requiring CGA for steadying. He denies any dizziness this session. He ambulated without assistive device today. After moving around some, he was able to ambulate x175 feet without AD, close supervision. He ambulated with  head turns with slight drifting but no loss of balance. He can ambulate posteriorly x10 feet without unsteadiness. Patient did ask about using an assistive device. PT educated patient that given unsteadiness with initial ambulation, using an assistive device overnight or when he first gets up in the morning could be helpful. He has a history of chronic back pain and demonstrates significant spinal stiffness which does affect reaction strategies. Recommend rollator for home use as needed. Patient would benefit from skilled PT intervention to improve strength, balance and mobility.       If plan is discharge home, recommend the following: A little help with walking and/or transfers;A little help with bathing/dressing/bathroom;Assistance with cooking/housework;Assist for transportation   Can travel by private Data processing manager (4 wheels)  Recommendations for Other Services       Functional Status Assessment Patient has had a recent decline in their functional status and demonstrates the ability to make significant improvements in function in a reasonable and predictable amount of time.     Precautions / Restrictions Precautions Precautions: Fall      Mobility  Bed Mobility Overal bed mobility: Modified Independent                  Transfers Overall transfer level: Modified independent   Transfers: Sit to/from Stand Sit to Stand: Modified independent (Device/Increase time)           General transfer comment: able to transfer sit<>stand from regular height chair without arm rests; 5 times sit<>Stand 14.6 sec indicating low risk for falls  Ambulation/Gait Ambulation/Gait assistance: Supervision, Contact guard assist Gait Distance (Feet): 175 Feet Assistive device: None Gait Pattern/deviations: Step-through pattern, Narrow base of support, Drifts right/left Gait velocity: decreased     General Gait Details: pt ambulates on level  surface with slower gait speed and increased lateral instability with initial ambulation. he ambuated 175 feet x2 reps, initial rep required CGA for safety with drift to left and occasional unsteadiness; During 2nd bout, able to demonstrate better gait speed with less lateral instability ambulating with close supervision  Stairs            Wheelchair Mobility     Tilt Bed    Modified Rankin (Stroke Patients Only)       Balance Overall balance assessment: Needs assistance Sitting-balance support: No upper extremity supported, Feet supported Sitting balance-Leahy Scale: Good Sitting balance - Comments: able to reach outside base of support without loss of balance Postural control: Posterior lean Standing balance support: No upper extremity supported Standing balance-Leahy Scale: Fair Standing balance comment: ambulated without AD requiring CGA to close supervision for safety;               High Level Balance Comments: ambulated with lateral and vertical head turns, slight unsteadiness but no loss of balance, able to walk backwards with supervision; does occasionally mis-step with narrow base of support with drifting left requiring CGA for steady             Pertinent Vitals/Pain Pain Assessment Pain Assessment: No/denies pain    Home Living Family/patient expects to be discharged to:: Private residence Living Arrangements: Spouse/significant other Available Help at Discharge: Family;Available 24 hours/day Type of Home: House Home Access: Level entry       Home Layout: One level Home Equipment: Shower seat;Cane - single point;Adaptive equipment      Prior Function Prior Level of Function : Driving;History of Falls (last six months);Independent/Modified Independent             Mobility Comments: Pt/spouse report indep with mobility, 1 fall, typically walks ~52min nearly every day ADLs Comments: Pt/spouse report indep with ADL, med mgt, short distance  familiar driving (spouse does majority of driving now), laundry, dishes.     Extremity/Trunk Assessment   Upper Extremity Assessment Upper Extremity Assessment: Defer to OT evaluation    Lower Extremity Assessment Lower Extremity Assessment: Overall WFL for tasks assessed;Generalized weakness    Cervical / Trunk Assessment Cervical / Trunk Assessment:  (stiff posture, increased lean to left side)  Communication   Communication Communication: Impaired Factors Affecting Communication: Hearing impaired    Cognition Arousal: Alert Behavior During Therapy: WFL for tasks assessed/performed                             Following commands: Impaired Following commands impaired: Follows one step commands with increased time, Follows multi-step commands inconsistently     Cueing Cueing Techniques: Verbal cues, Tactile cues     General Comments General comments (skin integrity, edema, etc.): denies any dizziness during session;    Exercises Other Exercises Other Exercises: Pt educated on role of PT; discussed assistive device- instructed patient/spouse how patient does have some unsteadiness with initial ambulation. Recommend rollator for better gait safety especially at night and/or early in the morning with initial mobility.   Assessment/Plan    PT Assessment Patient needs continued PT services  PT Problem List Decreased strength;Decreased coordination;Decreased activity tolerance;Decreased balance;Decreased safety awareness;Decreased mobility  PT Treatment Interventions DME instruction;Balance training;Gait training;Functional mobility training;Patient/family education;Therapeutic activities;Therapeutic exercise    PT Goals (Current goals can be found in the Care Plan section)  Acute Rehab PT Goals Patient Stated Goal: to go home PT Goal Formulation: With patient Time For Goal Achievement: 10/06/23 Potential to Achieve Goals: Good    Frequency Min  2X/week     Co-evaluation               AM-PAC PT "6 Clicks" Mobility  Outcome Measure Help needed turning from your back to your side while in a flat bed without using bedrails?: None Help needed moving from lying on your back to sitting on the side of a flat bed without using bedrails?: None Help needed moving to and from a bed to a chair (including a wheelchair)?: None Help needed standing up from a chair using your arms (e.g., wheelchair or bedside chair)?: None Help needed to walk in hospital room?: None Help needed climbing 3-5 steps with a railing? : A Little 6 Click Score: 23    End of Session Equipment Utilized During Treatment: Gait belt Activity Tolerance: Patient tolerated treatment well;No increased pain Patient left: in bed;with nursing/sitter in room;with family/visitor present Nurse Communication: Mobility status PT Visit Diagnosis: Unsteadiness on feet (R26.81);Muscle weakness (generalized) (M62.81)    Time: 1018-1050 PT Time Calculation (min) (ACUTE ONLY): 32 min   Charges:   PT Evaluation $PT Eval Low Complexity: 1 Low   PT General Charges $$ ACUTE PT VISIT: 1 Visit          Santo Zahradnik PT, DPT 09/22/2023, 1:02 PM

## 2023-09-24 ENCOUNTER — Telehealth: Payer: Self-pay

## 2023-09-24 NOTE — Transitions of Care (Post Inpatient/ED Visit) (Signed)
   09/24/2023  Name: [name] MRN: [id number] DOB: 02/17/1932  Today's TOC FU Call Status: Today's TOC FU Call Status:: Unsuccessful Call (1st Attempt) Unsuccessful Call (1st Attempt) Date: 09/24/23  Attempted to reach the patient regarding the most recent Inpatient/ED visit.  Follow Up Plan: Additional outreach attempts will be made to reach the patient to complete the Transitions of Care (Post Inpatient/ED visit) call.   Signature Darrall Ellison, LPN [facility] Nurse Health Advisor Direct Dial [phone]

## 2023-09-25 NOTE — Transitions of Care (Post Inpatient/ED Visit) (Signed)
 09/25/2023  Name: [name] MRN: [id number] DOB: 02-22-1932  Today's TOC FU Call Status: Today's TOC FU Call Status:: Successful TOC FU Call Completed Unsuccessful Call (1st Attempt) Date: 09/24/23 [facility] FU Call Complete Date: 09/25/23 Patient's Name and Date of Birth confirmed.  Transition Care Management Follow-up Telephone Call Date of Discharge: 09/22/23 Discharge Facility: [facility]) Type of Discharge: Emergency Department Reason for ED Visit: Other: (stroke) How have you been since you were released from the hospital?: Better Any questions or concerns?: No  Items Reviewed: Did you receive and understand the discharge instructions provided?: Yes Medications obtained,verified, and reconciled?: Yes (Medications Reviewed) Any new allergies since your discharge?: No Dietary orders reviewed?: Yes Do you have support at home?: Yes People in Home [RPT]: spouse  Medications Reviewed Today: Medications Reviewed Today     Reviewed by Darrall Ellison, LPN (Licensed Practical Nurse) on 09/25/23 at [phone]  Med List Status: <None>   Medication Order Taking? Sig Documenting Provider Last Dose Status Informant  amLODipine  (NORVASC ) 10 MG tablet [id number] No Take 1 tablet (10 mg total) by mouth daily. Claire Crick, MD 09/21/2023 Active Spouse/Significant Other  aspirin  EC 81 MG tablet [id number]  Take 1 tablet (81 mg total) by mouth daily. Swallow whole. Luna Salinas, MD  Active   azelastine  (ASTELIN ) 0.1 % nasal spray [id number] No Place 1 spray into both nostrils 2 (two) times daily. Use in each nostril as directed Claire Crick, MD 09/21/2023 Active Spouse/Significant Other  cetirizine (ZYRTEC) 10 MG tablet [id number] No Take 10 mg by mouth daily. Alternates weekly with allegra. OTC [provider] Past Week Active Spouse/Significant Other  clopidogrel  (PLAVIX ) 75 MG tablet [id number] No Take 1 tablet (75 mg total) by mouth daily. Claire Crick, MD 09/21/2023 Active Spouse/Significant Other  Coenzyme Q10-Vitamin E (QUNOL ULTRA COQ10 PO) [id number] No Take 1 capsule by mouth daily. OTC [provider] 09/21/2023 Active Spouse/Significant Other  cyanocobalamin  (VITAMIN B12) 1000 MCG tablet [id number] No Take 1 tablet (1,000 mcg total) by mouth daily.  Patient taking differently: Take 1,000 mcg by mouth daily. OTC   Claire Crick, MD 09/21/2023 Active Spouse/Significant Other  donepezil  (ARICEPT ) 5 MG tablet [id number] No Take 1 tablet (5 mg total) by mouth at bedtime. Claire Crick, MD 09/20/2023 Active Spouse/Significant Other  fexofenadine (ALLEGRA) 180 MG tablet [id number] No Take 180 mg by mouth daily. Alternates weekly with zyrtec. OTC [provider] Past Week Active Spouse/Significant Other  hydrALAZINE  (APRESOLINE ) 50 MG tablet [id number] No Take 1 tablet (50 mg total) by mouth 3 (three) times daily. Claire Crick, MD 09/21/2023 Active Spouse/Significant Other  losartan  (COZAAR ) 50 MG tablet [id number] No Take 1 tablet (50 mg total) by mouth daily. Claire Crick, MD 09/21/2023 Active Spouse/Significant Other  Magnesium  400 MG TABS [id number] No Take 1 tablet by mouth daily. OTC [provider] 09/21/2023 Active Spouse/Significant Other  Misc Natural Products (OSTEO BI-FLEX TRIPLE STRENGTH) TABS [id number] No Take 1 tablet by mouth daily. + vitamin D . OTC [provider] 09/21/2023 Active Spouse/Significant Other  Multiple Vitamins-Minerals (ONE-A-DAY MENS 50+) TABS [id number] No Take 1 tablet by mouth daily. OTC [provider] 09/21/2023 Active Spouse/Significant Other  pantoprazole  (PROTONIX ) 20 MG tablet [id number] No Take 1 tablet (20 mg total) by mouth daily. Claire Crick, MD 09/21/2023 Active Spouse/Significant Other  polyethylene glycol (MIRALAX  / GLYCOLAX ) packet [id number] No Take 8.5 g by mouth daily.  [provider] 09/21/2023 Active Spouse/Significant Other   pravastatin  (PRAVACHOL ) 20 MG tablet [id number]  No Take 1 tablet (20 mg total) by mouth 2 (two) times a week. Claire Crick, MD Past Week Active Spouse/Significant Other  triamcinolone  cream (KENALOG ) 0.1 % [id number] No Apply to affected areas rash on right hip twice daily until improved. Avoid applying to face, groin, and axilla. Use as directed. Long-term use can cause thinning of the skin. Elta Halter, MD Unknown Active Spouse/Significant Other           Med Note Blondie Burke   Fri Sep 21, 2023  1:27 PM) prn            Home Care and Equipment/Supplies: Were Home Health Services Ordered?: NA Any new equipment or medical supplies ordered?: NA  Functional Questionnaire: Do you need assistance with bathing/showering or dressing?: No Do you need assistance with meal preparation?: Yes Do you need assistance with eating?: No Do you have difficulty maintaining continence: No Do you need assistance with getting out of bed/getting out of a chair/moving?: No Do you have difficulty managing or taking your medications?: No  Follow up appointments reviewed: PCP Follow-up appointment confirmed?: Yes Date of PCP follow-up appointment?: 10/05/23 Follow-up Provider: [facility] Follow-up appointment confirmed?: NA Do you need transportation to your follow-up appointment?: No Do you understand care options if your condition(s) worsen?: Yes-patient verbalized understanding    SIGNATURE Darrall Ellison, LPN [facility] Nurse Health Advisor Direct Dial [phone]

## 2023-09-26 NOTE — Telephone Encounter (Signed)
See TOC phone note

## 2023-09-26 NOTE — Telephone Encounter (Signed)
 Please schedule Friday at 4pm

## 2023-09-27 ENCOUNTER — Telehealth: Payer: Self-pay | Admitting: Family Medicine

## 2023-09-27 NOTE — Telephone Encounter (Signed)
 Noted

## 2023-09-27 NOTE — Telephone Encounter (Signed)
 Copied from CRM [id number]. Topic: General - Other >> Sep 26, 2023  4:14 PM Aisha D wrote: Reason for CRM: Pt stated that he received a call from the office about an appt and is returning the call. I informed the pt that the office called to confirm if 5/23 was a good day for him for the er follow up. Patient confirmed that the appt for 5/23 at 4pm for ER follow up will work for him.

## 2023-09-28 ENCOUNTER — Ambulatory Visit: Admitting: Family Medicine

## 2023-09-28 ENCOUNTER — Encounter: Payer: Self-pay | Admitting: Family Medicine

## 2023-09-28 ENCOUNTER — Inpatient Hospital Stay: Admitting: Family Medicine

## 2023-09-28 VITALS — BP 160/58 | HR 64 | Temp 98.0°F | Ht 69.0 in | Wt 202.4 lb

## 2023-09-28 DIAGNOSIS — G4733 Obstructive sleep apnea (adult) (pediatric): Secondary | ICD-10-CM

## 2023-09-28 DIAGNOSIS — I10 Essential (primary) hypertension: Secondary | ICD-10-CM

## 2023-09-28 DIAGNOSIS — E785 Hyperlipidemia, unspecified: Secondary | ICD-10-CM | POA: Diagnosis not present

## 2023-09-28 DIAGNOSIS — Z8673 Personal history of transient ischemic attack (TIA), and cerebral infarction without residual deficits: Secondary | ICD-10-CM

## 2023-09-28 DIAGNOSIS — R454 Irritability and anger: Secondary | ICD-10-CM

## 2023-09-28 DIAGNOSIS — G459 Transient cerebral ischemic attack, unspecified: Secondary | ICD-10-CM | POA: Diagnosis not present

## 2023-09-28 MED ORDER — SERTRALINE HCL 25 MG PO TABS: 25.0000 mg | ORAL_TABLET | Freq: Every day | ORAL | 3 refills | Status: DC

## 2023-09-28 NOTE — Patient Instructions (Addendum)
 Call neurology Dr Dail Drought office to schedule appointment  - new referral placed. I will also place outpatient physical therapy referral to [facility].  Start sertraline 25mg  daily sent to pharmacy.  Continue regular losartan , amlodipine  daily. Continue hydralazine  50mg  twice daily, 3nd dose if blood pressure running high.  Ok to stay off magnesium .  Good to see you today Return as needed or in 3 months for follow up visit

## 2023-09-28 NOTE — Progress Notes (Signed)
 Ph: [phone] Fax: [phone]   Patient ID: [name], male    DOB: February 20, 1932, 89 y.o.   MRN: [id number]  This visit was conducted in person.  BP (!) 160/58 (BP Location: Right Arm, Cuff Size: Normal)   Pulse 64   Temp 98 F (36.7 C) (Oral)   Ht 5\' 9"  (1.753 m)   Wt 202 lb 6 oz (91.8 kg)   SpO2 96%   BMI 29.89 kg/m   BP Readings from Last 3 Encounters:  09/28/23 (!) 160/58  09/22/23 (!) 173/78  07/13/23 136/66   In office:  R 160/58 L 146/60  With home cuff today R 152/78 L 158/76  CC: hosp f/u visit  Subjective:   HPI: [name] is a 89 y.o. male presenting on 09/28/2023 for Hospitalization Follow-up (Ad[facility] at [facility], dx vertigo. Pt accompanied by wife, "[name]". )   Recent hospitalization for dizziness and unsteadiness. BP at presentation 143/77. Initial concern for stroke, code stroke called. Head CT and brain MRI negative for acute infarct but did show chronic small vessel infarcts to L>R cerebellum and new occlusion of distal R vertebral artery since prior CTA 2023. 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  Echocardiogram returned showing normal EF with G1DD, borderline dilation of ascending aorta.  LDL 63.  Hospital records reviewed. Med rec performed.  Wife notes worsening irritability over the past 3-6 months.   Neurology consulted, added aspirin  to plavix , rec outpatient neuro f/u.  Rec against surgical intervention.  Rec avoid hypotension.  Rec outpatient PT.  Found to have L sided weakness.   Since home, overall feeling well however did feel a little dizzy this morning.  He brings home logs showing persistently higher BP readings on L arm compared to R arm. No known subclavian stenosis on prior carotid US  (2020, 2025):  He is regular with CPAP use  Home health not set up.  Other  follow up appointments scheduled: none - needs to schedule neuro f/u.  ______________________________________________________________________ Hospital admission: 09/21/2023 Hospital discharge: 09/22/2023 TCM f/u phone call:  performed on 09/25/2023  Recommendations at discharge:  Please obtain CBC and BMP on follow-up Follow-up with primary care provider Follow-up with outpatient neurology   Discharge Diagnoses: Principal Problem:   Stroke-like symptoms Active Problems:   GERD (gastroesophageal reflux disease)   HTN (hypertension)   OSA on CPAP   Dyslipidemia   MCI (mild cognitive impairment)   History of cerebellar stroke   Vertigo     Relevant past medical, surgical, family and social history reviewed and updated as indicated. Interim medical history since our last visit reviewed. Allergies and medications reviewed and updated. Outpatient Medications Prior to Visit  Medication Sig Dispense Refill   amLODipine  (NORVASC ) 10 MG tablet Take 1 tablet (10 mg total) by mouth daily. 90 tablet 4   aspirin  EC 81 MG tablet Take 1 tablet (81 mg total) by mouth daily. Swallow whole. 30 tablet 12   azelastine  (ASTELIN ) 0.1 % nasal spray Place 1 spray into both nostrils 2 (two) times daily. Use in each nostril as directed 30 mL 12   cetirizine (ZYRTEC) 10 MG tablet Take 10 mg by mouth daily. Alternates weekly with allegra. OTC     clopidogrel  (PLAVIX ) 75 MG tablet Take 1 tablet (75 mg total) by mouth daily. 90 tablet 4   Coenzyme Q10-Vitamin E (  QUNOL ULTRA COQ10 PO) Take 1 capsule by mouth daily. OTC     cyanocobalamin  (VITAMIN B12) 1000 MCG tablet Take 1 tablet (1,000 mcg total) by mouth daily. (Patient taking differently: Take 1,000 mcg by mouth daily. OTC)     donepezil  (ARICEPT ) 5 MG tablet Take 1 tablet (5 mg total) by mouth at bedtime. 90 tablet 4   fexofenadine (ALLEGRA) 180 MG tablet Take 180 mg by mouth daily. Alternates weekly with zyrtec. OTC     hydrALAZINE  (APRESOLINE ) 50 MG tablet  Take 1 tablet (50 mg total) by mouth 3 (three) times daily. 270 tablet 4   losartan  (COZAAR ) 50 MG tablet Take 1 tablet (50 mg total) by mouth daily. 90 tablet 4   Misc Natural Products (OSTEO BI-FLEX TRIPLE STRENGTH) TABS Take 1 tablet by mouth daily. + vitamin D . OTC     Multiple Vitamins-Minerals (ONE-A-DAY MENS 50+) TABS Take 1 tablet by mouth daily. OTC     pantoprazole  (PROTONIX ) 20 MG tablet Take 1 tablet (20 mg total) by mouth daily. 90 tablet 4   polyethylene glycol (MIRALAX  / GLYCOLAX ) packet Take 8.5 g by mouth daily.      pravastatin  (PRAVACHOL ) 20 MG tablet Take 1 tablet (20 mg total) by mouth 2 (two) times a week. 24 tablet 4   triamcinolone  cream (KENALOG ) 0.1 % Apply to affected areas rash on right hip twice daily until improved. Avoid applying to face, groin, and axilla. Use as directed. Long-term use can cause thinning of the skin. 80 g 0   Magnesium  400 MG TABS Take 1 tablet by mouth daily. OTC     No facility-administered medications prior to visit.     Per HPI unless specifically indicated in ROS section below Review of Systems  Objective:  BP (!) 160/58 (BP Location: Right Arm, Cuff Size: Normal)   Pulse 64   Temp 98 F (36.7 C) (Oral)   Ht 5\' 9"  (1.753 m)   Wt 202 lb 6 oz (91.8 kg)   SpO2 96%   BMI 29.89 kg/m   Wt Readings from Last 3 Encounters:  09/28/23 202 lb 6 oz (91.8 kg)  09/21/23 202 lb 13.2 oz (92 kg)  07/20/23 203 lb (92.1 kg)      Physical Exam Vitals and nursing note reviewed.  Constitutional:      Appearance: Normal appearance. He is not ill-appearing.  HENT:     Head: Normocephalic and atraumatic.     Mouth/Throat:     Mouth: Mucous membranes are moist.     Pharynx: Oropharynx is clear. No oropharyngeal exudate or posterior oropharyngeal erythema.  Eyes:     Extraocular Movements: Extraocular movements intact.     Conjunctiva/sclera: Conjunctivae normal.     Pupils: Pupils are equal, round, and reactive to light.  Cardiovascular:      Rate and Rhythm: Normal rate and regular rhythm.     Pulses: Normal pulses.     Heart sounds: Normal heart sounds. No murmur heard. Pulmonary:     Effort: Pulmonary effort is normal. No respiratory distress.     Breath sounds: Normal breath sounds. No wheezing, rhonchi or rales.  Musculoskeletal:     Right lower leg: Edema (tr) present.     Left lower leg: Edema (tr) present.  Skin:    General: Skin is warm and dry.     Findings: No rash.  Neurological:     Mental Status: He is alert.  Psychiatric:        Mood and  Affect: Mood normal.        Behavior: Behavior normal.       Results for orders placed or performed in visit on 09/28/23  Basic metabolic panel with GFR   Collection Time: 09/28/23  2:51 PM  Result Value Ref Range   Glucose, Bld 106 (H) 65 - 99 mg/dL   BUN 22 7 - 25 mg/dL   Creat 2.13 0.86 - 5.78 mg/dL   eGFR 67 > OR = 60 IO/NGE/9.52W4   BUN/Creatinine Ratio SEE NOTE: 6 - 22 (calc)   Sodium 137 135 - 146 mmol/L   Potassium 4.3 3.5 - 5.3 mmol/L   Chloride 103 98 - 110 mmol/L   CO2 27 20 - 32 mmol/L   Calcium  9.0 8.6 - 10.3 mg/dL  CBC with Differential/Platelet   Collection Time: 09/28/23  2:51 PM  Result Value Ref Range   WBC 4.6 3.8 - 10.8 Thousand/uL   RBC 3.87 (L) 4.20 - 5.80 Million/uL   Hemoglobin 12.4 (L) 13.2 - 17.1 g/dL   HCT 13.2 (L) 44.0 - 10.2 %   MCV 94.3 80.0 - 100.0 fL   MCH 32.0 27.0 - 33.0 pg   MCHC 34.0 32.0 - 36.0 g/dL   RDW 72.5 36.6 - 44.0 %   Platelets 247 140 - 400 Thousand/uL   MPV 11.5 7.5 - 12.5 fL   Neutro Abs 2,746 1,500 - 7,800 cells/uL   Absolute Lymphocytes 1,178 850 - 3,900 cells/uL   Absolute Monocytes 524 200 - 950 cells/uL   Eosinophils Absolute 101 15 - 500 cells/uL   Basophils Absolute 51 0 - 200 cells/uL   Neutrophils Relative % 59.7 %   Total Lymphocyte 25.6 %   Monocytes Relative 11.4 %   Eosinophils Relative 2.2 %   Basophils Relative 1.1 %   Assessment & Plan:   Problem List Items Addressed This Visit      HTN (hypertension)   Rec permissive hypertension at this time. Discussed BP regimen with pt and wife. Home BP log showing L arm BP markedly higher than R arm however in office BP readings comparable with their cuffs, and actually R side higher with office cuff, and previous imaging hasn't showed signs of subclavian stenosis. Will continue to monitor for now.       OSA on CPAP   Continues nightly CPAP at 10cmH2O. Continue neuro f/u.       Relevant Orders   Ambulatory referral to Neurology   Dyslipidemia   Continue pravastatin  20mg  twice weekly.  H/o myalgias and elevated CPK on atorvastatin .       TIA (transient ischemic attack) - Primary   Presenting with dizziness/unsteadiness, in known h/o bilateral cerebellar infarcts. 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 Also with bilateral R>L ICA stenosis.  Aspirin  81mg  was added to plavix .  Rec permissive hypertension at this time. Discussed with pt/wife.  Pravastatin  20mg  twice weekly continued. Intolerance to atorvastatin  (myalgia, mild CPK elevation).  Found to have chronic L weakness - planned outpatient physical therapy course.  Encouraged neurology f/u.  Update labs today.       Relevant Orders   Ambulatory referral to Neurology   Ambulatory referral to Physical Therapy   Basic metabolic panel with GFR (Completed)   CBC with Differential/Platelet (Completed)   Irritability   Worsening symptoms over  the past 6 months noted by wife  Trial low dose SSRI        Meds ordered this encounter  Medications   sertraline (ZOLOFT) 25 MG tablet    Sig: Take 1 tablet (25 mg total) by mouth daily.    Dispense:  30 tablet    Refill:  3    Orders Placed This Encounter  Procedures   Basic metabolic panel with GFR   CBC with  Differential/Platelet   Ambulatory referral to Neurology    Referral Priority:   Routine    Referral Type:   Consultation    Referral Reason:   Specialty Services Required    Requested Specialty:   Neurology    Number of Visits Requested:   1   Ambulatory referral to Physical Therapy    Referral Priority:   Routine    Referral Type:   Physical Medicine    Referral Reason:   Specialty Services Required    Requested Specialty:   Physical Therapy    Number of Visits Requested:   1    Patient Instructions  Call neurology Dr Dail Drought office to schedule appointment  - new referral placed. I will also place outpatient physical therapy referral to [facility].  Start sertraline 25mg  daily sent to pharmacy.  Continue regular losartan , amlodipine  daily. Continue hydralazine  50mg  twice daily, 3nd dose if blood pressure running high.  Ok to stay off magnesium .  Good to see you today Return as needed or in 3 months for follow up visit  Follow up plan: Return in about 3 months (around 12/29/2023) for follow up visit.  Claire Crick, MD

## 2023-09-29 ENCOUNTER — Ambulatory Visit: Payer: Self-pay | Admitting: Family Medicine

## 2023-09-29 DIAGNOSIS — R454 Irritability and anger: Secondary | ICD-10-CM | POA: Insufficient documentation

## 2023-09-29 LAB — CBC WITH DIFFERENTIAL/PLATELET
Absolute Lymphocytes: 1178 {cells}/uL (ref 850–3900)
Absolute Monocytes: 524 {cells}/uL (ref 200–950)
Basophils Absolute: 51 {cells}/uL (ref 0–200)
Basophils Relative: 1.1 %
Eosinophils Absolute: 101 {cells}/uL (ref 15–500)
Eosinophils Relative: 2.2 %
HCT: 36.5 % — ABNORMAL LOW (ref 38.5–50.0)
Hemoglobin: 12.4 g/dL — ABNORMAL LOW (ref 13.2–17.1)
MCH: 32 pg (ref 27.0–33.0)
MCHC: 34 g/dL (ref 32.0–36.0)
MCV: 94.3 fL (ref 80.0–100.0)
MPV: 11.5 fL (ref 7.5–12.5)
Monocytes Relative: 11.4 %
Neutro Abs: 2746 {cells}/uL (ref 1500–7800)
Neutrophils Relative %: 59.7 %
Platelets: 247 10*3/uL (ref 140–400)
RBC: 3.87 10*6/uL — ABNORMAL LOW (ref 4.20–5.80)
RDW: 13.4 % (ref 11.0–15.0)
Total Lymphocyte: 25.6 %
WBC: 4.6 10*3/uL (ref 3.8–10.8)

## 2023-09-29 LAB — BASIC METABOLIC PANEL WITH GFR
BUN: 22 mg/dL (ref 7–25)
CO2: 27 mmol/L (ref 20–32)
Calcium: 9 mg/dL (ref 8.6–10.3)
Chloride: 103 mmol/L (ref 98–110)
Creat: 1.06 mg/dL (ref 0.70–1.22)
Glucose, Bld: 106 mg/dL — ABNORMAL HIGH (ref 65–99)
Potassium: 4.3 mmol/L (ref 3.5–5.3)
Sodium: 137 mmol/L (ref 135–146)
eGFR: 67 mL/min/{1.73_m2} (ref >=60)

## 2023-09-29 NOTE — Assessment & Plan Note (Addendum)
 Presenting with dizziness/unsteadiness, in known h/o bilateral cerebellar infarcts. 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 Also with bilateral R>L ICA stenosis.  Aspirin  81mg  was added to plavix .  Rec permissive hypertension at this time. Discussed with pt/wife.  Pravastatin  20mg  twice weekly continued. Intolerance to atorvastatin  (myalgia, mild CPK elevation).  Found to have chronic L weakness - planned outpatient physical therapy course.  Encouraged neurology f/u.  Update labs today.

## 2023-09-29 NOTE — Assessment & Plan Note (Signed)
 Continues nightly CPAP at 10cmH2O. Continue neuro f/u.

## 2023-09-29 NOTE — Assessment & Plan Note (Signed)
 Worsening symptoms over the past 6 months noted by wife  Trial low dose SSRI

## 2023-09-29 NOTE — Assessment & Plan Note (Addendum)
 Rec permissive hypertension at this time. Discussed BP regimen with pt and wife. Home BP log showing L arm BP markedly higher than R arm however in office BP readings comparable with their cuffs, and actually R side higher with office cuff, and previous imaging hasn't showed signs of subclavian stenosis. Will continue to monitor for now.

## 2023-09-29 NOTE — Assessment & Plan Note (Signed)
 Continue pravastatin  20mg  twice weekly.  H/o myalgias and elevated CPK on atorvastatin .

## 2023-10-05 ENCOUNTER — Inpatient Hospital Stay: Admitting: Family Medicine

## 2023-10-15 ENCOUNTER — Ambulatory Visit: Admitting: Family Medicine

## 2023-10-16 ENCOUNTER — Telehealth: Payer: Self-pay

## 2023-10-16 DIAGNOSIS — M5416 Radiculopathy, lumbar region: Secondary | ICD-10-CM | POA: Diagnosis not present

## 2023-10-16 DIAGNOSIS — M9903 Segmental and somatic dysfunction of lumbar region: Secondary | ICD-10-CM | POA: Diagnosis not present

## 2023-10-16 DIAGNOSIS — M9905 Segmental and somatic dysfunction of pelvic region: Secondary | ICD-10-CM | POA: Diagnosis not present

## 2023-10-16 DIAGNOSIS — M955 Acquired deformity of pelvis: Secondary | ICD-10-CM | POA: Diagnosis not present

## 2023-10-16 NOTE — Telephone Encounter (Signed)
 Copied from CRM [id number]. Topic: General - Other >> Oct 16, 2023 11:59 AM Luane Rumps D wrote: Reason for CRM: Patient's wife calling because Mr. Karczewski needs a handicap tag for his car. She said they sometimes take her car too so she is wondering if they need two/how they go about getting it. Please call patients wife back with any steps needed.

## 2023-10-16 NOTE — Telephone Encounter (Signed)
 Left message to return call to our office.

## 2023-10-17 ENCOUNTER — Ambulatory Visit: Payer: Medicare Other | Admitting: Dermatology

## 2023-10-17 ENCOUNTER — Encounter: Payer: Self-pay | Admitting: Dermatology

## 2023-10-17 DIAGNOSIS — L578 Other skin changes due to chronic exposure to nonionizing radiation: Secondary | ICD-10-CM | POA: Diagnosis not present

## 2023-10-17 DIAGNOSIS — W908XXA Exposure to other nonionizing radiation, initial encounter: Secondary | ICD-10-CM | POA: Diagnosis not present

## 2023-10-17 DIAGNOSIS — L814 Other melanin hyperpigmentation: Secondary | ICD-10-CM | POA: Diagnosis not present

## 2023-10-17 DIAGNOSIS — L821 Other seborrheic keratosis: Secondary | ICD-10-CM | POA: Diagnosis not present

## 2023-10-17 DIAGNOSIS — L820 Inflamed seborrheic keratosis: Secondary | ICD-10-CM

## 2023-10-17 DIAGNOSIS — L570 Actinic keratosis: Secondary | ICD-10-CM

## 2023-10-17 DIAGNOSIS — D692 Other nonthrombocytopenic purpura: Secondary | ICD-10-CM

## 2023-10-17 NOTE — Telephone Encounter (Signed)
 Left message on vm, per dpr, notifying pt/pt's wife, [name] (on dpr), that form is ready to pick up.   [Placed form at front office. Made copy to scan.]

## 2023-10-17 NOTE — Telephone Encounter (Signed)
 See other message by clicking blue 'View Conversation' link.   Spoke with pt's wife, [name] (on dpr), returning her call. States pt needs disability parking placards due to:  Cannot walk 200 feet without stopping to rest and occasionally needs to use a walker. Notified [name] will forward message to Dr Crissie Dome. She requests call back when form is ready to pick up. Gives permission to leave detailed message if no answer.   Placed form in Dr Ocie Belt box.

## 2023-10-17 NOTE — Patient Instructions (Addendum)
 Actinic keratoses are precancerous spots that appear secondary to cumulative UV radiation exposure/sun exposure over time. They are chronic with expected duration over 1 year. A portion of actinic keratoses will progress to squamous cell carcinoma of the skin. It is not possible to reliably predict which spots will progress to skin cancer and so treatment is recommended to prevent development of skin cancer.  Recommend daily broad spectrum sunscreen SPF 30+ to sun-exposed areas, reapply every 2 hours as needed.  Recommend staying in the shade or wearing long sleeves, sun glasses (UVA+UVB protection) and wide brim hats (4-inch brim around the entire circumference of the hat). Call for new or changing lesions.    Cryotherapy Aftercare  Wash gently with soap and water everyday.   Apply Vaseline and Band-Aid daily until healed.    Seborrheic Keratosis  What causes seborrheic keratoses? Seborrheic keratoses are harmless, common skin growths that first appear during adult life.  As time goes by, more growths appear.  Some people may develop a large number of them.  Seborrheic keratoses appear on both covered and uncovered body parts.  They are not caused by sunlight.  The tendency to develop seborrheic keratoses can be inherited.  They vary in color from skin-colored to gray, brown, or even black.  They can be either smooth or have a rough, warty surface.   Seborrheic keratoses are superficial and look as if they were stuck on the skin.  Under the microscope this type of keratosis looks like layers upon layers of skin.  That is why at times the top layer may seem to fall off, but the rest of the growth remains and re-grows.    Treatment Seborrheic keratoses do not need to be treated, but can easily be removed in the office.  Seborrheic keratoses often cause symptoms when they rub on clothing or jewelry.  Lesions can be in the way of shaving.  If they become inflamed, they can cause itching, soreness,  or burning.  Removal of a seborrheic keratosis can be accomplished by freezing, burning, or surgery. If any spot bleeds, scabs, or grows rapidly, please return to have it checked, as these can be an indication of a skin cancer.    Melanoma ABCDEs  Melanoma is the most dangerous type of skin cancer, and is the leading cause of death from skin disease.  You are more likely to develop melanoma if you: Have light-colored skin, light-colored eyes, or red or blond hair Spend a lot of time in the sun Tan regularly, either outdoors or in a tanning bed Have had blistering sunburns, especially during childhood Have a close family member who has had a melanoma Have atypical moles or large birthmarks  Early detection of melanoma is key since treatment is typically straightforward and cure rates are extremely high if we catch it early.   The first sign of melanoma is often a change in a mole or a new dark spot.  The ABCDE system is a way of remembering the signs of melanoma.  A for asymmetry:  The two halves do not match. B for border:  The edges of the growth are irregular. C for color:  A mixture of colors are present instead of an even brown color. D for diameter:  Melanomas are usually (but not always) greater than 6mm - the size of a pencil eraser. E for evolution:  The spot keeps changing in size, shape, and color.  Please check your skin once per month between visits. You can  use a small mirror in front and a large mirror behind you to keep an eye on the back side or your body.   If you see any new or changing lesions before your next follow-up, please call to schedule a visit.  Please continue daily skin protection including broad spectrum sunscreen SPF 30+ to sun-exposed areas, reapplying every 2 hours as needed when you're outdoors.   Staying in the shade or wearing long sleeves, sun glasses (UVA+UVB protection) and wide brim hats (4-inch brim around the entire circumference of the hat)  are also recommended for sun protection.    Due to recent changes in healthcare laws, you may see results of your pathology and/or laboratory studies on MyChart before the doctors have had a chance to review them. We understand that in some cases there may be results that are confusing or concerning to you. Please understand that not all results are received at the same time and often the doctors may need to interpret multiple results in order to provide you with the best plan of care or course of treatment. Therefore, we ask that you please give [place] 2 business days to thoroughly review all your results before contacting the office for clarification. Should we see a critical lab result, you will be contacted sooner.   If You Need Anything After Your Visit  If you have any questions or concerns for your doctor, please call our main line at [phone] and press option 4 to reach your doctor's medical assistant. If no one answers, please leave a voicemail as directed and we will return your call as soon as possible. Messages left after 4 pm will be answered the following business day.   You may also send [place] a message via MyChart. We typically respond to MyChart messages within 1-2 business days.  For prescription refills, please ask your pharmacy to contact our office. Our fax number is [phone].  If you have an urgent issue when the clinic is closed that cannot wait until the next business day, you can page your doctor at the number below.    Please note that while we do our best to be available for urgent issues outside of office hours, we are not available 24/7.   If you have an urgent issue and are unable to reach [place], you may choose to seek medical care at your doctor's office, retail clinic, urgent care center, or emergency room.  If you have a medical emergency, please immediately call 911 or go to the emergency department.  Pager Numbers  - Dr. Gwen Pounds: [phone]  - Dr. Roseanne Reno:  [phone]  - Dr. Katrinka Blazing: [phone]   In the event of inclement weather, please call our main line at [phone] for an update on the status of any delays or closures.  Dermatology Medication Tips: Please keep the boxes that topical medications come in in order to help keep track of the instructions about where and how to use these. Pharmacies typically print the medication instructions only on the boxes and not directly on the medication tubes.   If your medication is too expensive, please contact our office at [phone] option 4 or send [place] a message through MyChart.   We are unable to tell what your co-pay for medications will be in advance as this is different depending on your insurance coverage. However, we may be able to find a substitute medication at lower cost or fill out paperwork to get insurance to cover a needed medication.  If a prior authorization is required to get your medication covered by your insurance company, please allow [place] 1-2 business days to complete this process.  Drug prices often vary depending on where the prescription is filled and some pharmacies may offer cheaper prices.  The website www.goodrx.com contains coupons for medications through different pharmacies. The prices here do not account for what the cost may be with help from insurance (it may be cheaper with your insurance), but the website can give you the price if you did not use any insurance.  - You can print the associated coupon and take it with your prescription to the pharmacy.  - You may also stop by our office during regular business hours and pick up a GoodRx coupon card.  - If you need your prescription sent electronically to a different pharmacy, notify our office through [facility] or by phone at [phone] option 4.     Si Usted Necesita Algo Despus de Su Visita  Tambin puede enviarnos un mensaje a travs de Clinical cytogeneticist. Por lo general respondemos a los mensajes de  MyChart en el transcurso de 1 a 2 das hbiles.  Para renovar recetas, por favor pida a su farmacia que se ponga en contacto con nuestra oficina. Annie Sable de fax es [place] [phone].  Si tiene un asunto urgente cuando la clnica est cerrada y que no puede esperar hasta el siguiente da hbil, puede llamar/localizar a su doctor(a) al nmero que aparece a continuacin.   Por favor, tenga en cuenta que aunque hacemos todo lo posible para estar disponibles para asuntos urgentes fuera del horario de [place], no estamos disponibles las 24 horas del da, los 7 [address]  [address] de la [place].   Si tiene un problema urgente y no puede comunicarse con nosotros, puede optar por buscar atencin mdica  en el consultorio de su doctor(a), en una clnica privada, en un centro de atencin urgente o en una sala de emergencias.  Si tiene Engineer, drilling, por favor llame inmediatamente al 911 o vaya a la sala de emergencias.  Nmeros de bper  - Dr. Gwen Pounds: [phone]  - Dra. Roseanne Reno: [phone]  - Dr. Katrinka Blazing: [phone]   En caso de inclemencias del tiempo, por favor llame a Lacy Duverney principal al [phone] para una actualizacin sobre el [place] de cualquier retraso o cierre.  Consejos para la medicacin en dermatologa: Por favor, guarde las cajas en las que vienen los medicamentos de uso tpico para ayudarle a seguir las instrucciones sobre dnde y cmo usarlos. Las farmacias generalmente imprimen las instrucciones del medicamento slo en las cajas y no directamente en los tubos del [place].   Si su medicamento es muy caro, por favor, pngase en contacto con Rolm Gala llamando al [phone] y presione la opcin 4 o envenos un mensaje a travs de Clinical cytogeneticist.   No podemos decirle cul ser su copago por los medicamentos por adelantado ya que esto es diferente dependiendo de la cobertura de su seguro. Sin embargo, es posible que podamos encontrar un medicamento sustituto a Advice worker un formulario para que el seguro cubra el medicamento que se considera necesario.   Si se requiere una autorizacin previa para que su compaa de seguros [place] su medicamento, por favor permtanos de 1 a 2 das hbiles para completar [address]  Los precios de los medicamentos varan con frecuencia dependiendo del Environmental consultant de dnde se surte la receta y alguna farmacias pueden ofrecer precios ms baratos.  El sitio web  www.goodrx.com tiene cupones para medicamentos de Health and safety inspector. Los precios aqu no tienen en cuenta lo que podra costar con la ayuda del seguro (puede ser ms barato con su seguro), pero el sitio web puede darle el precio si no utiliz Tourist information centre manager.  - Puede imprimir el cupn correspondiente y llevarlo con su receta a la farmacia.  - Tambin puede pasar por nuestra oficina durante el horario de atencin regular y Education officer, museum una tarjeta de cupones de GoodRx.  - Si necesita que su receta se enve electrnicamente a una farmacia diferente, informe a nuestra oficina a travs de MyChart de [place] o por telfono llamando al [phone] y presione la opcin 4.

## 2023-10-17 NOTE — Telephone Encounter (Signed)
 Filled and in Lisa's box They will need to fill out top portion and take to [facility]

## 2023-10-17 NOTE — Progress Notes (Signed)
 Follow-Up Visit   Subjective  [name] is a 89 y.o. male who presents for the following: 6 - 8 month ak follow up , Hx of aks at face and ears  Patient reports some spots at face and arms  The patient has spots, moles and lesions to be evaluated, some may be new or changing and the patient may have concern these could be cancer.  The following portions of the chart were reviewed this encounter and updated as appropriate: medications, allergies, medical history  Review of Systems:  No other skin or systemic complaints except as noted in HPI or Assessment and Plan.  Objective  Well appearing patient in no apparent distress; mood and affect are within normal limits.  A focused examination was performed of the following areas: Face, ears, scalp, b/l arms, b/l legs, back  Relevant exam findings are noted in the Assessment and Plan.  right temple x 2, nasal bridge x 1, right zygoma x 2, left face x 5, left arm x 2 (12) Erythematous thin papules/macules with gritty scale.  left arm x 2, right arm x 2, right leg x 1, posterior leg x 1 (6) Erythematous stuck-on, waxy papule or plaque  Assessment & Plan   Purpura - Chronic; persistent and recurrent.  Treatable, but not curable. - Violaceous macules and patches - Benign - Related to trauma, age, sun damage and/or use of blood thinners, chronic use of topical and/or oral steroids - Observe - Can use OTC arnica containing moisturizer such as Dermend Bruise Formula if desired - Call for worsening or other concerns  SEBORRHEIC KERATOSIS - Stuck-on, waxy, tan-brown papules and/or plaques  - Benign-appearing - Discussed benign etiology and prognosis. - Observe - Call for any changes  ACTINIC DAMAGE - chronic, secondary to cumulative UV radiation exposure/sun exposure over time - diffuse scaly erythematous macules with underlying dyspigmentation - Recommend daily broad spectrum sunscreen SPF 30+ to sun-exposed areas, reapply  every 2 hours as needed.  - Recommend staying in the shade or wearing long sleeves, sun glasses (UVA+UVB protection) and wide brim hats (4-inch brim around the entire circumference of the hat). - Call for new or changing lesions.  LENTIGINES Exam: scattered tan macules Due to sun exposure Treatment Plan: Benign-appearing, observe. Recommend daily broad spectrum sunscreen SPF 30+ to sun-exposed areas, reapply every 2 hours as needed.  Call for any changes  ACTINIC KERATOSIS (12) right temple x 2, nasal bridge x 1, right zygoma x 2, left face x 5, left arm x 2 (12) Discussed nasal bridge treated with Ln2 - will recheck at next visit if not consider bx    Actinic keratoses are precancerous spots that appear secondary to cumulative UV radiation exposure/sun exposure over time. They are chronic with expected duration over 1 year. A portion of actinic keratoses will progress to squamous cell carcinoma of the skin. It is not possible to reliably predict which spots will progress to skin cancer and so treatment is recommended to prevent development of skin cancer.  Recommend daily broad spectrum sunscreen SPF 30+ to sun-exposed areas, reapply every 2 hours as needed.  Recommend staying in the shade or wearing long sleeves, sun glasses (UVA+UVB protection) and wide brim hats (4-inch brim around the entire circumference of the hat). Call for new or changing lesions. Destruction of lesion - right temple x 2, nasal bridge x 1, right zygoma x 2, left face x 5, left arm x 2 (12) Complexity: simple   Destruction method: cryotherapy  Informed consent: discussed and consent obtained   Timeout:  patient name, date of birth, surgical site, and procedure verified Lesion destroyed using liquid nitrogen: Yes   Region frozen until ice ball extended beyond lesion: Yes   Outcome: patient tolerated procedure well with no complications   Post-procedure details: wound care instructions given   INFLAMED SEBORRHEIC  KERATOSIS (6) left arm x 2, right arm x 2, right leg x 1, posterior leg x 1 (6) Symptomatic, irritating, patient would like treated. Destruction of lesion - left arm x 2, right arm x 2, right leg x 1, posterior leg x 1 (6) Complexity: simple   Destruction method: cryotherapy   Informed consent: discussed and consent obtained   Timeout:  patient name, date of birth, surgical site, and procedure verified Lesion destroyed using liquid nitrogen: Yes   Region frozen until ice ball extended beyond lesion: Yes   Outcome: patient tolerated procedure well with no complications   Post-procedure details: wound care instructions given   ACTINIC SKIN DAMAGE   SEBORRHEIC KERATOSIS   LENTIGO   PURPURA (HCC)    Return in about 8 months (around 06/18/2024) for or march tbse .  IRandee Busing, CMA, am acting as scribe for Celine Collard, MD.   Documentation: I have reviewed the above documentation for accuracy and completeness, and I agree with the above.  Celine Collard, MD

## 2023-10-24 NOTE — Telephone Encounter (Signed)
 [name] picked up form

## 2023-11-02 ENCOUNTER — Other Ambulatory Visit: Payer: Self-pay

## 2023-11-02 ENCOUNTER — Emergency Department

## 2023-11-02 ENCOUNTER — Encounter: Payer: Self-pay | Admitting: Intensive Care

## 2023-11-02 ENCOUNTER — Emergency Department
Admission: EM | Admit: 2023-11-02 | Discharge: 2023-11-02 | Disposition: A | Discharge status: Home / Self Care | Source: Home / Self Care | Attending: Emergency Medicine | Admitting: Emergency Medicine

## 2023-11-02 ENCOUNTER — Observation Stay
Admission: EM | Admit: 2023-11-02 | Discharge: 2023-11-04 | Disposition: A | Discharge status: Home / Self Care | Attending: Family Medicine | Admitting: Family Medicine

## 2023-11-02 DIAGNOSIS — R7989 Other specified abnormal findings of blood chemistry: Principal | ICD-10-CM

## 2023-11-02 DIAGNOSIS — I10 Essential (primary) hypertension: Secondary | ICD-10-CM | POA: Diagnosis present

## 2023-11-02 DIAGNOSIS — K219 Gastro-esophageal reflux disease without esophagitis: Secondary | ICD-10-CM | POA: Insufficient documentation

## 2023-11-02 DIAGNOSIS — I672 Cerebral atherosclerosis: Secondary | ICD-10-CM | POA: Diagnosis not present

## 2023-11-02 DIAGNOSIS — K8010 Calculus of gallbladder with chronic cholecystitis without obstruction: Secondary | ICD-10-CM | POA: Diagnosis not present

## 2023-11-02 DIAGNOSIS — R1011 Right upper quadrant pain: Secondary | ICD-10-CM | POA: Diagnosis not present

## 2023-11-02 DIAGNOSIS — K8500 Idiopathic acute pancreatitis without necrosis or infection: Secondary | ICD-10-CM | POA: Diagnosis not present

## 2023-11-02 DIAGNOSIS — I6782 Cerebral ischemia: Secondary | ICD-10-CM | POA: Insufficient documentation

## 2023-11-02 DIAGNOSIS — R079 Chest pain, unspecified: Secondary | ICD-10-CM | POA: Insufficient documentation

## 2023-11-02 DIAGNOSIS — G4733 Obstructive sleep apnea (adult) (pediatric): Secondary | ICD-10-CM | POA: Diagnosis not present

## 2023-11-02 DIAGNOSIS — N200 Calculus of kidney: Secondary | ICD-10-CM | POA: Diagnosis not present

## 2023-11-02 DIAGNOSIS — K851 Biliary acute pancreatitis without necrosis or infection: Secondary | ICD-10-CM

## 2023-11-02 DIAGNOSIS — I4430 Unspecified atrioventricular block: Secondary | ICD-10-CM | POA: Diagnosis not present

## 2023-11-02 DIAGNOSIS — K5730 Diverticulosis of large intestine without perforation or abscess without bleeding: Secondary | ICD-10-CM | POA: Diagnosis not present

## 2023-11-02 DIAGNOSIS — K8020 Calculus of gallbladder without cholecystitis without obstruction: Secondary | ICD-10-CM | POA: Diagnosis present

## 2023-11-02 DIAGNOSIS — Z7982 Long term (current) use of aspirin: Secondary | ICD-10-CM | POA: Diagnosis not present

## 2023-11-02 DIAGNOSIS — R918 Other nonspecific abnormal finding of lung field: Secondary | ICD-10-CM | POA: Diagnosis not present

## 2023-11-02 DIAGNOSIS — F0393 Unspecified dementia, unspecified severity, with mood disturbance: Secondary | ICD-10-CM | POA: Insufficient documentation

## 2023-11-02 DIAGNOSIS — Z79899 Other long term (current) drug therapy: Secondary | ICD-10-CM | POA: Insufficient documentation

## 2023-11-02 DIAGNOSIS — R7401 Elevation of levels of liver transaminase levels: Secondary | ICD-10-CM | POA: Diagnosis not present

## 2023-11-02 DIAGNOSIS — K859 Acute pancreatitis without necrosis or infection, unspecified: Secondary | ICD-10-CM | POA: Diagnosis not present

## 2023-11-02 DIAGNOSIS — Z8673 Personal history of transient ischemic attack (TIA), and cerebral infarction without residual deficits: Secondary | ICD-10-CM | POA: Insufficient documentation

## 2023-11-02 DIAGNOSIS — E785 Hyperlipidemia, unspecified: Secondary | ICD-10-CM | POA: Insufficient documentation

## 2023-11-02 DIAGNOSIS — E876 Hypokalemia: Secondary | ICD-10-CM

## 2023-11-02 DIAGNOSIS — R109 Unspecified abdominal pain: Secondary | ICD-10-CM | POA: Diagnosis not present

## 2023-11-02 DIAGNOSIS — I700 Atherosclerosis of aorta: Secondary | ICD-10-CM | POA: Diagnosis not present

## 2023-11-02 DIAGNOSIS — K8689 Other specified diseases of pancreas: Secondary | ICD-10-CM | POA: Diagnosis not present

## 2023-11-02 DIAGNOSIS — R001 Bradycardia, unspecified: Secondary | ICD-10-CM | POA: Diagnosis not present

## 2023-11-02 DIAGNOSIS — Z7902 Long term (current) use of antithrombotics/antiplatelets: Secondary | ICD-10-CM | POA: Insufficient documentation

## 2023-11-02 DIAGNOSIS — R42 Dizziness and giddiness: Secondary | ICD-10-CM | POA: Diagnosis not present

## 2023-11-02 DIAGNOSIS — Z85828 Personal history of other malignant neoplasm of skin: Secondary | ICD-10-CM | POA: Diagnosis not present

## 2023-11-02 DIAGNOSIS — R0789 Other chest pain: Secondary | ICD-10-CM | POA: Diagnosis not present

## 2023-11-02 DIAGNOSIS — N281 Cyst of kidney, acquired: Secondary | ICD-10-CM | POA: Diagnosis not present

## 2023-11-02 LAB — CBC WITH DIFFERENTIAL/PLATELET
Abs Immature Granulocytes: 0.02 10*3/uL (ref 0.00–0.07)
Basophils Absolute: 0.1 10*3/uL (ref 0.0–0.1)
Basophils Relative: 1 %
Eosinophils Absolute: 0.1 10*3/uL (ref 0.0–0.5)
Eosinophils Relative: 2 %
HCT: 34 % — ABNORMAL LOW (ref 39.0–52.0)
Hemoglobin: 11.4 g/dL — ABNORMAL LOW (ref 13.0–17.0)
Immature Granulocytes: 1 %
Lymphocytes Relative: 21 %
Lymphs Abs: 0.9 10*3/uL (ref 0.7–4.0)
MCH: 31.9 pg (ref 26.0–34.0)
MCHC: 33.5 g/dL (ref 30.0–36.0)
MCV: 95.2 fL (ref 80.0–100.0)
Monocytes Absolute: 0.5 10*3/uL (ref 0.1–1.0)
Monocytes Relative: 10 %
Neutro Abs: 2.9 10*3/uL (ref 1.7–7.7)
Neutrophils Relative %: 65 %
Platelets: 226 10*3/uL (ref 150–400)
RBC: 3.57 MIL/uL — ABNORMAL LOW (ref 4.22–5.81)
RDW: 14.2 % (ref 11.5–15.5)
WBC: 4.4 10*3/uL (ref 4.0–10.5)
nRBC: 0 % (ref 0.0–0.2)

## 2023-11-02 LAB — BASIC METABOLIC PANEL WITH GFR
Anion gap: 10 (ref 5–15)
BUN: 21 mg/dL (ref 8–23)
CO2: 24 mmol/L (ref 22–32)
Calcium: 8.8 mg/dL — ABNORMAL LOW (ref 8.9–10.3)
Chloride: 104 mmol/L (ref 98–111)
Creatinine, Ser: 0.88 mg/dL (ref 0.61–1.24)
GFR, Estimated: >60 mL/min (ref >60)
Glucose, Bld: 207 mg/dL — ABNORMAL HIGH (ref 70–99)
Potassium: 3.4 mmol/L — ABNORMAL LOW (ref 3.5–5.1)
Sodium: 138 mmol/L (ref 135–145)

## 2023-11-02 LAB — TROPONIN I (HIGH SENSITIVITY)
Troponin I (High Sensitivity): 7 ng/L (ref <18)
Troponin I (High Sensitivity): 7 ng/L (ref <18)
Troponin I (High Sensitivity): 7 ng/L (ref <18)
Troponin I (High Sensitivity): 7 ng/L (ref <18)

## 2023-11-02 LAB — COMPREHENSIVE METABOLIC PANEL WITH GFR
ALT: 39 U/L (ref 0–44)
AST: 84 U/L — ABNORMAL HIGH (ref 15–41)
Albumin: 3.5 g/dL (ref 3.5–5.0)
Alkaline Phosphatase: 52 U/L (ref 38–126)
Anion gap: 9 (ref 5–15)
BUN: 23 mg/dL (ref 8–23)
CO2: 24 mmol/L (ref 22–32)
Calcium: 8.7 mg/dL — ABNORMAL LOW (ref 8.9–10.3)
Chloride: 107 mmol/L (ref 98–111)
Creatinine, Ser: 0.96 mg/dL (ref 0.61–1.24)
GFR, Estimated: >60 mL/min (ref >60)
Glucose, Bld: 116 mg/dL — ABNORMAL HIGH (ref 70–99)
Potassium: 3.5 mmol/L (ref 3.5–5.1)
Sodium: 140 mmol/L (ref 135–145)
Total Bilirubin: 1 mg/dL (ref 0.0–1.2)
Total Protein: 6.2 g/dL — ABNORMAL LOW (ref 6.5–8.1)

## 2023-11-02 LAB — HEPATIC FUNCTION PANEL
ALT: 198 U/L — ABNORMAL HIGH (ref 0–44)
AST: 332 U/L — ABNORMAL HIGH (ref 15–41)
Albumin: 3.8 g/dL (ref 3.5–5.0)
Alkaline Phosphatase: 73 U/L (ref 38–126)
Bilirubin, Direct: 1 mg/dL — ABNORMAL HIGH (ref 0.0–0.2)
Indirect Bilirubin: 1 mg/dL — ABNORMAL HIGH (ref 0.3–0.9)
Total Bilirubin: 2 mg/dL — ABNORMAL HIGH (ref 0.0–1.2)
Total Protein: 6.7 g/dL (ref 6.5–8.1)

## 2023-11-02 LAB — CBC
HCT: 36.7 % — ABNORMAL LOW (ref 39.0–52.0)
Hemoglobin: 12.5 g/dL — ABNORMAL LOW (ref 13.0–17.0)
MCH: 32.7 pg (ref 26.0–34.0)
MCHC: 34.1 g/dL (ref 30.0–36.0)
MCV: 96.1 fL (ref 80.0–100.0)
Platelets: 252 10*3/uL (ref 150–400)
RBC: 3.82 MIL/uL — ABNORMAL LOW (ref 4.22–5.81)
RDW: 14.3 % (ref 11.5–15.5)
WBC: 6 10*3/uL (ref 4.0–10.5)
nRBC: 0 % (ref 0.0–0.2)

## 2023-11-02 LAB — LIPASE, BLOOD
Lipase: 60 U/L — ABNORMAL HIGH (ref 11–51)
Lipase: 44 U/L (ref 11–51)

## 2023-11-02 MED ORDER — HYDRALAZINE HCL 50 MG PO TABS
50.0000 mg | ORAL_TABLET | Freq: Three times a day (TID) | ORAL | Status: DC
  Administered 2023-11-02 – 2023-11-04 (×5): 50 mg via ORAL
  Filled 2023-11-02 (×5): qty 1

## 2023-11-02 MED ORDER — IOHEXOL 300 MG/ML  SOLN
100.0000 mL | Freq: Once | INTRAMUSCULAR | Status: AC | PRN
  Administered 2023-11-02: 100 mL via INTRAVENOUS

## 2023-11-02 MED ORDER — GADOBUTROL 1 MMOL/ML IV SOLN
9.0000 mL | Freq: Once | INTRAVENOUS | Status: AC | PRN
  Administered 2023-11-02: 9 mL via INTRAVENOUS

## 2023-11-02 MED ORDER — MORPHINE SULFATE (PF) 2 MG/ML IV SOLN: 2.0000 mg | INTRAVENOUS | Status: DC | PRN

## 2023-11-02 MED ORDER — ONDANSETRON HCL 4 MG/2ML IJ SOLN: 4.0000 mg | Freq: Four times a day (QID) | INTRAMUSCULAR | Status: DC | PRN

## 2023-11-02 MED ORDER — CETIRIZINE HCL 10 MG PO TABS
10.0000 mg | ORAL_TABLET | Freq: Every day | ORAL | Status: DC
  Administered 2023-11-04: 10 mg via ORAL
  Filled 2023-11-02 (×2): qty 1

## 2023-11-02 MED ORDER — MAGNESIUM HYDROXIDE 400 MG/5ML PO SUSP: 30.0000 mL | Freq: Every day | ORAL | Status: DC | PRN

## 2023-11-02 MED ORDER — SODIUM CHLORIDE 0.9 % IV SOLN: Freq: Once | INTRAVENOUS | Status: AC

## 2023-11-02 MED ORDER — PANTOPRAZOLE SODIUM 40 MG IV SOLR
40.0000 mg | Freq: Once | INTRAVENOUS | Status: AC
  Administered 2023-11-02: 40 mg via INTRAVENOUS
  Filled 2023-11-02: qty 10

## 2023-11-02 MED ORDER — AZELASTINE HCL 0.1 % NA SOLN
1.0000 | Freq: Two times a day (BID) | NASAL | Status: DC
  Administered 2023-11-03 – 2023-11-04 (×2): 1 via NASAL
  Filled 2023-11-02: qty 30

## 2023-11-02 MED ORDER — LOSARTAN POTASSIUM 50 MG PO TABS
50.0000 mg | ORAL_TABLET | Freq: Every day | ORAL | Status: DC
  Administered 2023-11-03 – 2023-11-04 (×2): 50 mg via ORAL
  Filled 2023-11-02 (×2): qty 1

## 2023-11-02 MED ORDER — SODIUM CHLORIDE 0.9 % IV SOLN: INTRAVENOUS | Status: AC

## 2023-11-02 MED ORDER — DONEPEZIL HCL 5 MG PO TABS
5.0000 mg | ORAL_TABLET | Freq: Every day | ORAL | Status: DC
  Administered 2023-11-02 – 2023-11-03 (×2): 5 mg via ORAL
  Filled 2023-11-02 (×2): qty 1

## 2023-11-02 MED ORDER — POLYETHYLENE GLYCOL 3350 17 G PO PACK
8.5000 g | PACK | Freq: Every day | ORAL | Status: DC
  Administered 2023-11-04: 8.5 g via ORAL
  Filled 2023-11-02: qty 1

## 2023-11-02 MED ORDER — ADULT MULTIVITAMIN W/MINERALS CH
1.0000 | ORAL_TABLET | Freq: Every day | ORAL | Status: DC
  Administered 2023-11-04: 1 via ORAL
  Filled 2023-11-02: qty 1

## 2023-11-02 MED ORDER — ACETAMINOPHEN 325 MG PO TABS: 650.0000 mg | ORAL_TABLET | Freq: Four times a day (QID) | ORAL | Status: DC | PRN

## 2023-11-02 MED ORDER — ENOXAPARIN SODIUM 40 MG/0.4ML IJ SOSY
40.0000 mg | PREFILLED_SYRINGE | INTRAMUSCULAR | Status: DC
  Administered 2023-11-04: 40 mg via SUBCUTANEOUS
  Filled 2023-11-02: qty 0.4

## 2023-11-02 MED ORDER — ACETAMINOPHEN 650 MG RE SUPP: 650.0000 mg | Freq: Four times a day (QID) | RECTAL | Status: DC | PRN

## 2023-11-02 MED ORDER — SERTRALINE HCL 50 MG PO TABS
25.0000 mg | ORAL_TABLET | Freq: Every day | ORAL | Status: DC
  Administered 2023-11-04: 25 mg via ORAL
  Filled 2023-11-02: qty 1

## 2023-11-02 MED ORDER — TRAZODONE HCL 50 MG PO TABS
25.0000 mg | ORAL_TABLET | Freq: Every evening | ORAL | Status: DC | PRN
  Administered 2023-11-03: 25 mg via ORAL
  Filled 2023-11-02: qty 1

## 2023-11-02 MED ORDER — AMLODIPINE BESYLATE 10 MG PO TABS
10.0000 mg | ORAL_TABLET | Freq: Every day | ORAL | Status: DC
  Administered 2023-11-03 – 2023-11-04 (×2): 10 mg via ORAL
  Filled 2023-11-02 (×2): qty 1

## 2023-11-02 MED ORDER — COENZYME Q10-VITAMIN E 100-150 MG-UNIT PO CAPS: ORAL_CAPSULE | Freq: Every day | ORAL | Status: DC

## 2023-11-02 MED ORDER — PANTOPRAZOLE SODIUM 20 MG PO TBEC
20.0000 mg | DELAYED_RELEASE_TABLET | Freq: Every day | ORAL | Status: DC
  Administered 2023-11-03 – 2023-11-04 (×2): 20 mg via ORAL
  Filled 2023-11-02 (×2): qty 1

## 2023-11-02 MED ORDER — ONDANSETRON HCL 4 MG PO TABS: 4.0000 mg | ORAL_TABLET | Freq: Four times a day (QID) | ORAL | Status: DC | PRN

## 2023-11-02 NOTE — ED Notes (Signed)
 MRI called pt for screening

## 2023-11-02 NOTE — ED Provider Notes (Addendum)
 Care assumed of patient from outgoing provider.  See their note for initial history, exam and plan.  Clinical Course as of 11/02/23 1000  Fri Nov 02, 2023  0705 Transferring ED care to Dr. Suzanne to follow up labs and reassess. [CF]  0709 Pmh of cva w/o deficits - woke up from sleep with chest pressure. ASA and nitroglycerin given and chest pain free now.  EKG w/o ischemia but nonspecific rhythm.  Trop pending.  [SM]  [id number] Patient states he is feeling much better.  No upper abdominal pain at this time.  Continues to have mild right sided discomfort.  Discussed at length admission for observation versus outpatient follow-up closely with his cardiologist and primary care physician.  Ultimately decided with his wife and the patient that they were good to follow-up closely with his cardiologist and return to the emergency department if he had any return of symptoms.  Discussed a low-fat diet. [SM]    Clinical Course User Index [CF] Gordan Huxley, MD [SM] Suzanne Kirsch, MD  Initial troponin negative.  Complaining of some mild upper abdominal pain with nausea.  Will obtain right upper quadrant ultrasound to further evaluate for symptomatic cholelithiasis or acute cholecystitis.  Will give 1 dose of acid reflux medication.  On chart review patient had a nuclear med stress test that showed no inducible ischemia that was done within the past couple of months.  Has been told that he had significant plaque buildup to his carotid arteries but was not a good surgical candidate for carotid endarterectomy.  Currently chest pain-free.  Plan for serial troponin.   Suzanne Kirsch, MD 11/02/23 9147    Suzanne Kirsch, MD 11/02/23 1000

## 2023-11-02 NOTE — ED Triage Notes (Signed)
 Arrives via for chest pain from home Chest pain started at 0500 today  Pt c/o of upper chest pain Pt drank a coke in hopes it would get better States he drank it to belch, thought it was indigestion Pt took 324 mg ASA at home per EMS  EMS gave 3 sprays of nitro EMS placed #18 IV LFA EMS vitals: 109/62, HR 50-60s, 97% RA, CBG 157

## 2023-11-02 NOTE — ED Notes (Signed)
 CCMD called to place Pt on Cardiac monitoring.

## 2023-11-02 NOTE — ED Provider Notes (Signed)
 [facility] Provider Note    Event Date/Time   First MD Initiated Contact with Patient 11/02/23 [phone]     (approximate)   History   Chest Pain   HPI [name] is a 89 y.o. male who presents for evaluation of acute onset chest pain.  He says that it woke him from sleep at about 5 AM although he is not sure if it was because he needed to go to the bathroom that he woke up.  He describes it as a severe pain in the center of his chest that feels both like acid reflux and like a pressure/heaviness.  It got worse over time rather than better, so his wife called EMS.  Prior to EMS arriving, he took a full dose aspirin .  They gave him 3 sublingual nitroglycerin and by the time I saw him after arrival in the ED, his symptoms had completely resolved.  At no point did he feel short of breath.  He has no pain in his abdomen.  No numbness, tingling, nor weakness in his extremities.  He says he has no history of heart problems, although he has had at least 1 stroke in the past with no residual deficits.     Physical Exam   Triage Vital Signs: ED Triage Vitals  Encounter Vitals Group     BP 11/02/23 0617 126/67     Girls Systolic BP Percentile --      Girls Diastolic BP Percentile --      Boys Systolic BP Percentile --      Boys Diastolic BP Percentile --      Pulse Rate 11/02/23 0617 62     Resp 11/02/23 0621 20     Temp 11/02/23 0617 98.2 F (36.8 C)     Temp src --      SpO2 11/02/23 0612 97 %     Weight 11/02/23 0618 90.7 kg (200 lb)     Height 11/02/23 0618 1.759 m (5' 9.25)     Head Circumference --      Peak Flow --      Pain Score --      Pain Loc --      Pain Education --      Exclude from Growth Chart --     Most recent vital signs: Vitals:   11/02/23 0617 11/02/23 0621  BP: 126/67   Pulse: 62   Resp:  20  Temp: 98.2 F (36.8 C)   SpO2: 95%     General: Awake, alert and conversant, no pain at this time. CV:  Good peripheral  perfusion.  Somewhat slow and irregular rate and rhythm but normal heart sounds. Resp:  Normal effort. Speaking easily and comfortably, no accessory muscle usage nor intercostal retractions.  Lungs clear to auscultation. Abd:  No distention.  No tenderness to palpation of the abdomen including in the epigastrium. Other:  No appreciable focal neurological deficits   ED Results / Procedures / Treatments   Labs (all labs ordered are listed, but only abnormal results are displayed) Labs Reviewed  COMPREHENSIVE METABOLIC PANEL WITH GFR - Abnormal; Notable for the following components:      Result Value   Glucose, Bld 116 (*)    Calcium  8.7 (*)    Total Protein 6.2 (*)    AST 84 (*)    All other components within normal limits  LIPASE, BLOOD - Abnormal; Notable for the following components:   Lipase 60 (*)  All other components within normal limits  CBC WITH DIFFERENTIAL/PLATELET - Abnormal; Notable for the following components:   RBC 3.57 (*)    Hemoglobin 11.4 (*)    HCT 34.0 (*)    All other components within normal limits  TROPONIN I (HIGH SENSITIVITY)  TROPONIN I (HIGH SENSITIVITY)     EKG  ED ECG REPORT I, Darleene Dome, the attending physician, personally viewed and interpreted this ECG.  Date: 11/02/2023 EKG Time: 6:14 AM Rate: 62 Rhythm: Irregular rate, ectopic rhythm with first-degree AV block QRS Axis: normal Intervals: PR interval of 254 ms ST/T Wave abnormalities: Non-specific ST segment / T-wave changes, but no clear evidence of acute ischemia. Narrative Interpretation: no definitive evidence of acute ischemia; does not meet STEMI criteria.    RADIOLOGY See ED course for details   PROCEDURES:  Critical Care performed: No  .1-3 Lead EKG Interpretation  Performed by: Dome Darleene, MD Authorized by: Dome Darleene, MD     Interpretation: normal     ECG rate:  63   ECG rate assessment: normal     Rhythm: sinus rhythm     Ectopy: none      Conduction: normal       IMPRESSION / MDM / ASSESSMENT AND PLAN / ED COURSE  I reviewed the triage vital signs and the nursing notes.                              Differential diagnosis includes, but is not limited to, ACS, PNA, PE, biliary colic, pancreatitis.  Patient's presentation is most consistent with acute presentation with potential threat to life or bodily function.  Labs/studies ordered: HS troponin, CMP, lipase, CBC w/ Diff, EKG, CXR  Interventions/Medications given:  Medications  pantoprazole  (PROTONIX ) injection 40 mg (has no administration in time range)    (Note:  hospital course my include additional interventions and/or labs/studies not listed above.)   Patient without known cardiac disease, but history of awakening with pain is concerning.  No ischemia on EKG, though EKG is abnormal due to unclear rhythm.  Patient asymptomatic after NTG x 3 by EMS.  Awaiting lab workup.  Disposition will depend on patient's wishes, whether the pain returns, and lab findings.  The patient is on the cardiac monitor to evaluate for evidence of arrhythmia and/or significant heart rate changes.   Clinical Course as of 11/02/23 0759  Fri Nov 02, 2023  0705 Transferring ED care to Dr. Suzanne to follow up labs and reassess. [CF]  0709 Pmh of cva w/o deficits - woke up from sleep with chest pressure. ASA and nitroglycerin given and chest pain free now.  EKG w/o ischemia but nonspecific rhythm.  Trop pending.  [SM]    Clinical Course User Index [CF] Dome Darleene, MD [SM] Suzanne Kirsch, MD     FINAL CLINICAL IMPRESSION(S) / ED DIAGNOSES   Final diagnoses:  None     Rx / DC Orders   ED Discharge Orders     None        Note:  This document was prepared using Dragon voice recognition software and may include unintentional dictation errors.   Dome Darleene, MD 11/02/23 [phone]

## 2023-11-02 NOTE — ED Provider Notes (Signed)
 Care assumed of patient from outgoing provider.  See their note for initial history, exam and plan.  Clinical Course as of 11/02/23 1000  Fri Nov 02, 2023  0705 Transferring ED care to Dr. Suzanne to follow up labs and reassess. [CF]  0709 Pmh of cva w/o deficits - woke up from sleep with chest pressure. ASA and nitroglycerin given and chest pain free now.  EKG w/o ischemia but nonspecific rhythm.  Trop pending.  [SM]  [id number] Patient states he is feeling much better.  No upper abdominal pain at this time.  Continues to have mild right sided discomfort.  Discussed at length admission for observation versus outpatient follow-up closely with his cardiologist and primary care physician.  Ultimately decided with his wife and the patient that they were good to follow-up closely with his cardiologist and return to the emergency department if he had any return of symptoms.  Discussed a low-fat diet. [SM]    Clinical Course User Index [CF] Gordan Huxley, MD [SM] Suzanne Kirsch, MD  And troponin negative.  Currently chest pain-free.  Plan for second troponin.   Suzanne Kirsch, MD 11/02/23 [phone]

## 2023-11-02 NOTE — Assessment & Plan Note (Signed)
-   We will hold off statin therapy. - Will follow LFTs.

## 2023-11-02 NOTE — Assessment & Plan Note (Addendum)
-   The patient will be admitted to the medical floor and the observation bed. - This is associated with elevated LFTs specifically transaminases. - Pain management will be provided. - Will follow LFTs. - General Surgery consult will be obtained. - Dr. Cesar was notified about the patient

## 2023-11-02 NOTE — Assessment & Plan Note (Signed)
-   This is evidenced by abdominal pelvic CT scan with normal lipase though. - Will keep the patient n.p.o. for now except for medications. - Will follow LFTs in a.m.

## 2023-11-02 NOTE — Assessment & Plan Note (Signed)
Will continue antihypertensive therapy.

## 2023-11-02 NOTE — H&P (Signed)
 [place]   PATIENT NAME: [name]    MR#:  [id number]  DATE OF BIRTH:  10-04-1931  DATE OF ADMISSION:  11/02/2023  PRIMARY CARE PHYSICIAN: Rilla Baller, MD   Patient is coming from: Home   REQUESTING/REFERRING PHYSICIAN: Floy Roberts, MD   CHIEF COMPLAINT:   Chief Complaint  Patient presents with   Chest Pain  Nausea and vomiting with upper abdominal pain.  HISTORY OF PRESENT ILLNESS:  [name] is a 89 y.o. male with medical history significant for essential hypertension, dyslipidemia, OSA on CPAP, osteoarthritis, prediabetes,, and diverticulitis, who presented to the emergency room with acute onset of epigastric abdominal pain with associated nausea and vomiting after eating today.  The patient was seen in the ER last night for chest pain.  He had negative troponins.  He was advised to be admitted to observation however he decided to leave when he felt better.  No bilious vomitus or hematemesis.  No cough or wheezing or hemoptysis.  No fever or chills.  No dysuria, oliguria or hematuria or flank pain.  ED Course: When the patient came to the ER, vital signs were within normal.  Labs revealed hypokalemia 3.4 and glucose of 207.  AST was 332 and ALT 198 with total bili of 2 and direct bili 1.  High-sensitivity troponin was 7 twice.  CBC showed mild anemia with hemoglobin 12.5 hematocrit 36.7 comparable to previous levels.  Lipase level was 44. EKG as reviewed by me : EKG showed suspected sinus rhythm with rate of 62 with suspected Wenckebach phenomenon Imaging: Right upper quadrant ultrasound revealed cholelithiasis without cholecystitis. Abdominal pelvic CT scan with contrast revealed the following: 1. Minimal edema adjacent to the head and uncinate process of the pancreas, suspicious for acute pancreatitis. Recommend correlation with pancreatic enzymes. 2. Gallstones on ultrasound earlier today are not well-defined by CT. No biliary dilatation. 3.  Punctate nonobstructing left renal stone. 4. Colonic diverticulosis without diverticulitis. 5. Aortic Atherosclerosis.  MRCP with and without contrast revealed the following: 1. The head and uncinate process of the pancreas appear mildly edematous with a small amount of adjacent fat stranding. Findings are compatible with acute pancreatitis. No signs of pancreatic necrosis or pseudocyst formation. 2. Cholelithiasis without signs of acute cholecystitis. 3. No bile duct dilatation or signs of choledocholithiasis.  Noncontrasted head CT scan revealed no acute intracranial abnormalities.  The patient was given hydration with IV normal saline at 150 mL/h.  She will be admitted to observation medical telemetry bed for further evaluation and management.   PAST MEDICAL HISTORY:   Past Medical History:  Diagnosis Date   Actinic keratosis    AV block, 1st degree 10/05/2021   With bradycardia 09/2021 - drop Toprol  XL to 25mg  daily, increase amlodipine  to 10mg  daily. Donepezil  can also cause AV block.      Carotid stenosis, asymptomatic, bilateral 09/16/2016   Diffuse 1-39% bilaterally, but involving ICA, ECA, CCA - rpt 1 yr (09/2016)   Complication of anesthesia    slow to wake up after a colonoscopy years ago   Diverticulitis    Dyslipidemia 09/04/2017   Atorvastatin  elevated CPK levels.      GERD (gastroesophageal reflux disease)    Gout    History of cerebellar stroke 01/09/2022   Cerebellar stroke     History of osteomyelitis 10/2015   left foot   History of squamous cell carcinoma of skin    HTN (hypertension)    Infection of left great  toe due to methicillin resistant Staphylococcus aureus (MRSA) 2015   Hx s/p IV abx 7 wks through PICC (~2015)     MCI (mild cognitive impairment) 02/17/2020   MMSE (02/2020) 24/30, 25 with cue. Misses 3 orientation, 2 recall (1 with cue), 1 language. Independent ADLs and IADLs. 4/4 clock drawing test.   MMSE 08/2021 23/30, CDT 4/4     OSA on CPAP  09/01/2016   Osteoarthritis    back, neck, knees - multiple joints   Personal history of malignant neoplasm of larynx 2009   s/p XRT, Skin Cancer- pre cancerous   Pre-diabetes    Prediabetes 01/09/2022   Seasonal allergies    Sensorineural hearing loss (SNHL) of both ears 09/01/2016   Sinus bradycardia 02/07/2022   Sinus bradycardia     Squamous cell carcinoma of skin unknown   Treated in [place] - SCC of the L post auricular    Squamous cell carcinoma of skin 10/26/2022   R med knee, [facility]    PAST SURGICAL HISTORY:   Past Surgical History:  Procedure Laterality Date   ACHILLES TENDON REPAIR Left    AMPUTATION Left 05/16/2019   LEFT GREAT TOE AMPUTATION for osteomyelitis Claud, Jerona GAILS, MD), 2nd toe removed   APPENDECTOMY     COLONOSCOPY     THROAT SURGERY N/A 2009   ?vocal cord vs larynx nodule removal, had XRT after this but states it was not cancer   TONSILLECTOMY     TREATMENT FISTULA ANAL      SOCIAL HISTORY:   Social History   Tobacco Use   Smoking status: Never   Smokeless tobacco: Never  Substance Use Topics   Alcohol use: Yes    Comment: occasional wine    FAMILY HISTORY:   Family History  Problem Relation Age of Onset   Hypertension Mother    Hypertension Father    Cancer Other        unknown - niece   CAD Sister 31       stent (80% blockage)   CAD Brother 84       stent (100% blockage)    DRUG ALLERGIES:   Allergies  Allergen Reactions   Lisinopril Cough   Atorvastatin  Other (See Comments)    Myalgias and mildly elevated CPK     REVIEW OF SYSTEMS:   ROS As per history of present illness. All pertinent systems were reviewed above. Constitutional, HEENT, cardiovascular, respiratory, GI, GU, musculoskeletal, neuro, psychiatric, endocrine, integumentary and hematologic systems were reviewed and are otherwise negative/unremarkable except for positive findings mentioned above in the HPI.   MEDICATIONS AT HOME:   Prior to Admission  medications   Medication Sig Start Date End Date Taking? Authorizing Provider  amLODipine  (NORVASC ) 10 MG tablet Take 1 tablet (10 mg total) by mouth daily. 04/13/23   Rilla Baller, MD  aspirin  EC 81 MG tablet Take 1 tablet (81 mg total) by mouth daily. Swallow whole. 09/23/23   Amin, Sumayya, MD  azelastine  (ASTELIN ) 0.1 % nasal spray Place 1 spray into both nostrils 2 (two) times daily. Use in each nostril as directed 04/13/23   Rilla Baller, MD  cetirizine (ZYRTEC) 10 MG tablet Take 10 mg by mouth daily. Alternates weekly with allegra. OTC    [provider]  clopidogrel  (PLAVIX ) 75 MG tablet Take 1 tablet (75 mg total) by mouth daily. 04/13/23   Rilla Baller, MD  Coenzyme Q10-Vitamin E (QUNOL ULTRA COQ10 PO) Take 1 capsule by mouth daily. OTC  [provider]  cyanocobalamin  (VITAMIN B12) 1000 MCG tablet Take 1 tablet (1,000 mcg total) by mouth daily. Patient taking differently: Take 1,000 mcg by mouth daily. OTC 12/05/22   Rilla Baller, MD  donepezil  (ARICEPT ) 5 MG tablet Take 1 tablet (5 mg total) by mouth at bedtime. 04/13/23   Rilla Baller, MD  fexofenadine (ALLEGRA) 180 MG tablet Take 180 mg by mouth daily. Alternates weekly with zyrtec. OTC    [provider]  hydrALAZINE  (APRESOLINE ) 50 MG tablet Take 1 tablet (50 mg total) by mouth 3 (three) times daily. 04/13/23   Rilla Baller, MD  losartan  (COZAAR ) 50 MG tablet Take 1 tablet (50 mg total) by mouth daily. 04/13/23   Rilla Baller, MD  Misc Natural Products (OSTEO BI-FLEX TRIPLE STRENGTH) TABS Take 1 tablet by mouth daily. + vitamin D . OTC    [provider]  Multiple Vitamins-Minerals (ONE-A-DAY MENS 50+) TABS Take 1 tablet by mouth daily. OTC    [provider]  pantoprazole  (PROTONIX ) 20 MG tablet Take 1 tablet (20 mg total) by mouth daily. 04/13/23   Rilla Baller, MD  polyethylene glycol (MIRALAX  / GLYCOLAX ) packet Take 8.5 g by mouth daily.     [provider]  pravastatin  (PRAVACHOL ) 20 MG tablet Take 1 tablet (20 mg total) by mouth 2 (two) times a week. 04/16/23   Rilla Baller, MD  sertraline  (ZOLOFT ) 25 MG tablet Take 1 tablet (25 mg total) by mouth daily. 09/28/23   Rilla Baller, MD  triamcinolone  cream (KENALOG ) 0.1 % Apply to affected areas rash on right hip twice daily until improved. Avoid applying to face, groin, and axilla. Use as directed. Long-term use can cause thinning of the skin. 04/19/23   Hester Alm BROCKS, MD      VITAL SIGNS:  Blood pressure (!) 129/58, pulse 65, temperature 98.7 F (37.1 C), resp. rate 18, height 5' 9 (1.753 m), weight 93 kg, SpO2 96%.  PHYSICAL EXAMINATION:  Physical Exam  GENERAL:  89 y.o.-year-old Caucasian male patient lying in the bed with no acute distress.  EYES: Pupils equal, round, reactive to light and accommodation. No scleral icterus. Extraocular muscles intact.  HEENT: Head atraumatic, normocephalic. Oropharynx and nasopharynx clear.  NECK:  Supple, no jugular venous distention. No thyroid  enlargement, no tenderness.  LUNGS: Normal breath sounds bilaterally, no wheezing, rales,rhonchi or crepitation. No use of accessory muscles of respiration.  CARDIOVASCULAR: Regular rate and rhythm, S1, S2 normal. No murmurs, rubs, or gallops.  ABDOMEN: Soft, nondistended, with left upper quadrant tenderness without rebound tenderness guarding or rigidity.. Bowel sounds present. No organomegaly or mass.  EXTREMITIES: No pedal edema, cyanosis, or clubbing.  NEUROLOGIC: Cranial nerves II through XII are intact. Muscle strength 5/5 in all extremities. Sensation intact. Gait not checked.  PSYCHIATRIC: The patient is alert and oriented x 3.  Normal affect and good eye contact. SKIN: No obvious rash, lesion, or ulcer.   LABORATORY PANEL:   CBC Recent Labs  Lab 11/02/23 1504  WBC 6.0  HGB 12.5*  HCT 36.7*  PLT 252    ------------------------------------------------------------------------------------------------------------------  Chemistries  Recent Labs  Lab 11/02/23 1504  NA 138  K 3.4*  CL 104  CO2 24  GLUCOSE 207*  BUN 21  CREATININE 0.88  CALCIUM  8.8*  AST 332*  ALT 198*  ALKPHOS 73  BILITOT 2.0*   ------------------------------------------------------------------------------------------------------------------  Cardiac Enzymes No results for input(s): TROPONINI in the last 168 hours. ------------------------------------------------------------------------------------------------------------------  RADIOLOGY:  MR ABDOMEN MRCP W WO CONTAST Result  Date: 11/03/2023 CLINICAL DATA:  Acute pancreatitis. EXAM: MRI ABDOMEN WITHOUT AND WITH CONTRAST (INCLUDING MRCP) TECHNIQUE: Multiplanar multisequence MR imaging of the abdomen was performed both before and after the administration of intravenous contrast. Heavily T2-weighted images of the biliary and pancreatic ducts were obtained, and three-dimensional MRCP images were rendered by post processing. CONTRAST:  9mL GADAVIST  GADOBUTROL  1 MMOL/ML IV SOLN COMPARISON:  11/02/2023 FINDINGS: Lower chest: No acute findings. Hepatobiliary: No enhancing liver lesions. Multiple stones within the gallbladder are identified measuring up to 6 mm. There is no significant gallbladder wall thickening or pericholecystic inflammation. Mild changes adenomyomatosis noted within the gallbladder fundus. The common bile duct measures 5 mm. No intrahepatic bile duct dilatation. The MRCP images are motion limited. No signs of choledocholithiasis. Pancreas: The head and uncinate process of the pancreas appear mildly edematous with a small amount of adjacent fat stranding. No significant free fluid. No signs of pancreatic necrosis or pseudocyst formation. No pancreatic mass identified. Spleen:  Within normal limits in size and appearance. Adrenals/Urinary Tract: Normal adrenal  glands. Lower pole left kidney cyst containing thin hairline internal septation without measurable enhancement is identified compatible with a Bosniak class 2 kidney cyst, image 18/3. No follow-up imaging recommended. Additional small cortical and parapelvic cysts are noted bilaterally. No signs of obstructive uropathy. Stomach/Bowel: Visualized portions within the abdomen are unremarkable. Vascular/Lymphatic: Aortic atherosclerosis. Patent abdominal vascularity. No abdominal adenopathy. Other:  None. Musculoskeletal: Thoracolumbar scoliosis and degenerative disc disease noted within the lower thoracic and lumbar spine IMPRESSION: 1. The head and uncinate process of the pancreas appear mildly edematous with a small amount of adjacent fat stranding. Findings are compatible with acute pancreatitis. No signs of pancreatic necrosis or pseudocyst formation. 2. Cholelithiasis without signs of acute cholecystitis. 3. No bile duct dilatation or signs of choledocholithiasis. Electronically Signed   By: Waddell Calk M.D.   On: 11/03/2023 04:32   MR 3D Recon At Scanner Result Date: 11/03/2023 CLINICAL DATA:  Acute pancreatitis. EXAM: MRI ABDOMEN WITHOUT AND WITH CONTRAST (INCLUDING MRCP) TECHNIQUE: Multiplanar multisequence MR imaging of the abdomen was performed both before and after the administration of intravenous contrast. Heavily T2-weighted images of the biliary and pancreatic ducts were obtained, and three-dimensional MRCP images were rendered by post processing. CONTRAST:  9mL GADAVIST  GADOBUTROL  1 MMOL/ML IV SOLN COMPARISON:  11/02/2023 FINDINGS: Lower chest: No acute findings. Hepatobiliary: No enhancing liver lesions. Multiple stones within the gallbladder are identified measuring up to 6 mm. There is no significant gallbladder wall thickening or pericholecystic inflammation. Mild changes adenomyomatosis noted within the gallbladder fundus. The common bile duct measures 5 mm. No intrahepatic bile duct  dilatation. The MRCP images are motion limited. No signs of choledocholithiasis. Pancreas: The head and uncinate process of the pancreas appear mildly edematous with a small amount of adjacent fat stranding. No significant free fluid. No signs of pancreatic necrosis or pseudocyst formation. No pancreatic mass identified. Spleen:  Within normal limits in size and appearance. Adrenals/Urinary Tract: Normal adrenal glands. Lower pole left kidney cyst containing thin hairline internal septation without measurable enhancement is identified compatible with a Bosniak class 2 kidney cyst, image 18/3. No follow-up imaging recommended. Additional small cortical and parapelvic cysts are noted bilaterally. No signs of obstructive uropathy. Stomach/Bowel: Visualized portions within the abdomen are unremarkable. Vascular/Lymphatic: Aortic atherosclerosis. Patent abdominal vascularity. No abdominal adenopathy. Other:  None. Musculoskeletal: Thoracolumbar scoliosis and degenerative disc disease noted within the lower thoracic and lumbar spine IMPRESSION: 1. The head and uncinate process of the  pancreas appear mildly edematous with a small amount of adjacent fat stranding. Findings are compatible with acute pancreatitis. No signs of pancreatic necrosis or pseudocyst formation. 2. Cholelithiasis without signs of acute cholecystitis. 3. No bile duct dilatation or signs of choledocholithiasis. Electronically Signed   By: Waddell Calk M.D.   On: 11/03/2023 04:32   CT Head Wo Contrast Result Date: 11/02/2023 CLINICAL DATA:  Dizziness and heaviness in center of chest EXAM: CT HEAD WITHOUT CONTRAST TECHNIQUE: Contiguous axial images were obtained from the base of the skull through the vertex without intravenous contrast. RADIATION DOSE REDUCTION: This exam was performed according to the departmental dose-optimization program which includes automated exposure control, adjustment of the mA and/or kV according to patient size and/or use  of iterative reconstruction technique. COMPARISON:  CT and MRI 09/21/2018 FINDINGS: Brain: No intracranial hemorrhage, mass effect, or evidence of acute infarct. No hydrocephalus. No extra-axial fluid collection. Generalized cerebral atrophy and chronic small vessel ischemic disease. Vascular: No hyperdense vessel. Intracranial arterial calcification. Skull: No fracture or focal lesion. Sinuses/Orbits: No acute finding. Other: None. IMPRESSION: No acute intracranial abnormality. Electronically Signed   By: Norman Gatlin M.D.   On: 11/02/2023 18:51   CT ABDOMEN PELVIS W CONTRAST Result Date: 11/02/2023 CLINICAL DATA:  89 year old with abdominal pain. EXAM: CT ABDOMEN AND PELVIS WITH CONTRAST TECHNIQUE: Multidetector CT imaging of the abdomen and pelvis was performed using the standard protocol following bolus administration of intravenous contrast. RADIATION DOSE REDUCTION: This exam was performed according to the departmental dose-optimization program which includes automated exposure control, adjustment of the mA and/or kV according to patient size and/or use of iterative reconstruction technique. CONTRAST:  OMNIPAQUE  IOHEXOL  300 MG/ML  SOLN COMPARISON:  CT 12/10/2022. Right upper quadrant ultrasound earlier today. FINDINGS: Lower chest: Areas of atelectasis in the left greater than right lower lobe. No pleural fluid. There are coronary artery calcifications. Hepatobiliary: No focal liver abnormality. Gallstones on ultrasound earlier today are not well-defined by CT. There is no biliary dilatation. Pancreas: There is minimal edema adjacent to the head and uncinate process of the pancreas, series 2, image 37. No pancreatic ductal dilatation or evidence of pancreatic mass. Spleen: Normal in size without focal abnormality. Adrenals/Urinary Tract: Normal adrenal glands. No hydronephrosis or renal inflammation. Punctate nonobstructing stone in the lower left kidney. Left renal cyst. No further follow-up  imaging is recommended. Unremarkable urinary bladder. Stomach/Bowel: Unremarkable appearance of the stomach. No bowel dilatation or evidence of obstruction. The appendix is not confidently visualized. Moderate volume of stool in the colon. Left colonic diverticulosis. No diverticulitis. Sigmoid colon is redundant. Vascular/Lymphatic: Dense aortic atherosclerosis. Moderate branch atherosclerosis. No aneurysm. Patent portal vein. No suspicious lymphadenopathy. Reproductive: Prostate is unremarkable. Other: No free air, free fluid, or intra-abdominal fluid collection. Small fat containing umbilical hernia. Musculoskeletal: Scoliosis and degenerative change throughout the spine. There are no acute or suspicious osseous abnormalities. IMPRESSION: 1. Minimal edema adjacent to the head and uncinate process of the pancreas, suspicious for acute pancreatitis. Recommend correlation with pancreatic enzymes. 2. Gallstones on ultrasound earlier today are not well-defined by CT. No biliary dilatation. 3. Punctate nonobstructing left renal stone. 4. Colonic diverticulosis without diverticulitis. Aortic Atherosclerosis (ICD10-I70.0). Electronically Signed   By: Andrea Gasman M.D.   On: 11/02/2023 18:51   US  ABDOMEN LIMITED RUQ (LIVER/GB) Result Date: 11/02/2023 CLINICAL DATA:  Right upper quadrant pain for 1 day. EXAM: ULTRASOUND ABDOMEN LIMITED RIGHT UPPER QUADRANT COMPARISON:  12/10/2022 FINDINGS: Gallbladder: Gallstones identified measuring up to 6 mm. No  gallbladder wall thickening, pericholecystic fluid or sonographic Murphy's sign. Common bile duct: Diameter: 4.2 mm.  No intrahepatic bile duct dilatation. Liver: No focal lesion identified. Within normal limits in parenchymal echogenicity. Portal vein is patent on color Doppler imaging with normal direction of blood flow towards the liver. Other: None. IMPRESSION: Cholelithiasis without secondary signs of acute cholecystitis. Electronically Signed   By: Waddell Calk  M.D.   On: 11/02/2023 08:33   DG Chest Portable 1 View Result Date: 11/02/2023 CLINICAL DATA:  Chest pain. EXAM: PORTABLE CHEST 1 VIEW COMPARISON:  12/10/2022 FINDINGS: The heart size and mediastinal contours are within normal limits. Aortic atherosclerotic calcifications. Unchanged, chronic bronchial wall thickening. No pleural fluid, interstitial edema or airspace disease. The visualized skeletal structures are unremarkable. IMPRESSION: 1. No acute findings. 2. Chronic bronchial wall thickening. Electronically Signed   By: Waddell Calk M.D.   On: 11/02/2023 07:00      IMPRESSION AND PLAN:  Assessment and Plan: * Symptomatic cholelithiasis - The patient will be admitted to an observation medical telemetry bed. - This is associated with elevated LFTs specifically transaminases. - Pain management will be provided. - Will follow LFTs. - General Surgery consult will be obtained. - Dr. Cesar was notified about the patient  Acute pancreatitis - This is evidenced by abdominal pelvic CT scan with normal lipase though. - Will keep the patient n.p.o. for now except for medications. - Will follow LFTs in a.m.  Hypokalemia - Potassium will be replaced and magnesium  level will be checked.  Essential hypertension - Will continue antihypertensive therapy  Dyslipidemia - We will hold off statin therapy. - Will follow LFTs.  GERD without esophagitis - Will continue PPI therapy.  Dementia, senile with depression (HCC) - Will continue Aricept  and Zoloft .   DVT prophylaxis: Lovenox.  Advanced Care Planning:  Code Status: full code.  Family Communication:  The plan of care was discussed in details with the patient (and family). I answered all questions. The patient agreed to proceed with the above mentioned plan. Further management will depend upon hospital course. Disposition Plan: Back to previous home environment Consults called: General Surgery. All the records are reviewed and case  discussed with ED provider.  Status is: Observation  I certify that at the time of admission, it is my clinical judgment that the patient will require hospital care extending less than 2 midnights.                            Dispo: The patient is from: Home              Anticipated d/c is to: Home              Patient currently is not medically stable to d/c.              Difficult to place patient: No  Madison DELENA Peaches M.D on 11/03/2023 at 4:48 AM  [facility]   From 7 PM-7 AM, contact night-coverage [email or web address]  CC: Primary care physician; Rilla Baller, MD

## 2023-11-02 NOTE — ED Provider Notes (Signed)
 [facility] Provider Note    Event Date/Time   First MD Initiated Contact with Patient 11/02/23 1553     (approximate)   History   Abdominal pain   HPI  [name] is a 89 y.o. male who presents to the emergency department today because of concerns for abdominal pain nausea and vomiting.  Patient had been seen in the emergency department last night early this morning.  At that time he stated his pain was more in his chest.  Per chart review had negative troponin although admission was offered for observation.  Patient declined since he was feeling better went home.  Today after eating he went to sit down when he started developing the pain.  His emesis has been clear phlegm.  He has not noticed any blood in it.  Denies any recent change in his stooling.  Denies any fevers or chills.     Physical Exam   Triage Vital Signs: ED Triage Vitals  Encounter Vitals Group     BP 11/02/23 1501 (!) 137/53     Girls Systolic BP Percentile --      Girls Diastolic BP Percentile --      Boys Systolic BP Percentile --      Boys Diastolic BP Percentile --      Pulse Rate 11/02/23 1501 (!) 50     Resp 11/02/23 1501 16     Temp 11/02/23 1501 97.6 F (36.4 C)     Temp Source 11/02/23 1501 Oral     SpO2 11/02/23 1501 97 %     Weight 11/02/23 1502 200 lb (90.7 kg)     Height 11/02/23 1502 5' 9.25 (1.759 m)     Head Circumference --      Peak Flow --      Pain Score 11/02/23 1502 6     Pain Loc --      Pain Education --      Exclude from Growth Chart --     Most recent vital signs: Vitals:   11/02/23 1501  BP: (!) 137/53  Pulse: (!) 50  Resp: 16  Temp: 97.6 F (36.4 C)  SpO2: 97%   General: Awake, alert, oriented. CV:  Good peripheral perfusion. Regular rate and rhythm. Resp:  Normal effort. Lungs clear. Abd:  No distention. Tender to palpation in the epigastric and right upper quadrant.    ED Results / Procedures / Treatments   Labs (all labs  ordered are listed, but only abnormal results are displayed) Labs Reviewed  BASIC METABOLIC PANEL WITH GFR - Abnormal; Notable for the following components:      Result Value   Potassium 3.4 (*)    Glucose, Bld 207 (*)    Calcium  8.8 (*)    All other components within normal limits  CBC - Abnormal; Notable for the following components:   RBC 3.82 (*)    Hemoglobin 12.5 (*)    HCT 36.7 (*)    All other components within normal limits  HEPATIC FUNCTION PANEL  LIPASE, BLOOD  TROPONIN I (HIGH SENSITIVITY)  TROPONIN I (HIGH SENSITIVITY)     EKG  I, Guadalupe Eagles, attending physician, personally viewed and interpreted this EKG  EKG Time: 1449 Rate: 56 Rhythm: sinus bradycardia with 1st degree av block Axis: normal Intervals: qtc 478 QRS: incomplete RBBB ST changes: no st elevation Impression: abnormal ekg    RADIOLOGY I independently interpreted and visualized the CT abd/pel. My interpretation: inflammation at head  of pancreas Radiology interpretation:  IMPRESSION:  1. Minimal edema adjacent to the head and uncinate process of the  pancreas, suspicious for acute pancreatitis. Recommend correlation  with pancreatic enzymes.  2. Gallstones on ultrasound earlier today are not well-defined by  CT. No biliary dilatation.  3. Punctate nonobstructing left renal stone.  4. Colonic diverticulosis without diverticulitis.    I independently interpreted and visualized the CT head. My interpretation: No ICH Radiology interpretation:  IMPRESSION:  No acute intracranial abnormality.   MRCP Prelim Read: Gallstones, no cholecystitis. No choledocholithiasis or biliary dilatation  PROCEDURES:  Critical Care performed: No   MEDICATIONS ORDERED IN ED: Medications - No data to display   IMPRESSION / MDM / ASSESSMENT AND PLAN / ED COURSE  I reviewed the triage vital signs and the nursing notes.                              Differential diagnosis includes, but is not limited  to, pancreatitis, cholecystitis, choledocholithiasis  Patient's presentation is most consistent with acute presentation with potential threat to life or bodily function.   Patient presented to the emergency department today because of concerns for epigastric pain, nausea and vomiting.  Had been seen last night for chest pain.  On exam he is slightly tender in the epigastric region.  He had an ultrasound performed yesterday which showed gallstones but no signs for cholecystitis. LFTs elevated compared to what they were at previous ER visit. Did obtain a CT scan which showed findings concerning for possible pancreatitis, however lipase normal. Given concern for possible choledocholithiasis an MRCP was performed. Prelim read without findings concerning for choledocholithiasis. Do wonder if patient might have passed a stone. Discussed findings with patient. Do think patient would benefit from further observation and recheck of blood work in the morning. Discussed with Dr. Lawence with the hospitalist service who will evaluate for admission.    FINAL CLINICAL IMPRESSION(S) / ED DIAGNOSES   Final diagnoses:  Elevated LFTs  Gallstones       Note:  This document was prepared using Dragon voice recognition software and may include unintentional dictation errors.    Floy Roberts, MD 11/02/23 [id number]

## 2023-11-02 NOTE — Assessment & Plan Note (Signed)
-   Will continue Aricept  and Zoloft .

## 2023-11-02 NOTE — Discharge Instructions (Addendum)
 You are seen in the emergency department for chest pain.  You had 2 heart enzymes that were normal.  Your chest x-ray was normal.  You had an ultrasound of your gallbladder that showed a gallstone but did not show any findings of an infection.  You had a nuclear med stress test done in March that did not show any signs of ischemia.   If you have any return of symptoms it is important that you return immediately to the emergency department for reevaluation.  Call your primary care physician and cardiologist today to schedule close follow-up appointment.

## 2023-11-02 NOTE — ED Notes (Signed)
 Fall precautions in place for Pt. This RN placed fall band, fall grip socks, bed alarm and fall sign.

## 2023-11-02 NOTE — ED Triage Notes (Addendum)
 C/o heaviness in center of chest. Seen at [facility] this AM and offered to be admitted. Patient declined and discharged home and told to follow up with pcp and cardiologist.  Patient reports dizziness started about an hour ago. Denies vision changes.   Patient reports he is back because chest pain is back and N/V

## 2023-11-02 NOTE — Assessment & Plan Note (Signed)
 Will continue PPI therapy.

## 2023-11-03 ENCOUNTER — Encounter
Admission: EM | Disposition: A | Discharge status: Home / Self Care | Payer: Self-pay | Source: Home / Self Care | Attending: Emergency Medicine

## 2023-11-03 ENCOUNTER — Observation Stay: Admitting: Anesthesiology

## 2023-11-03 ENCOUNTER — Other Ambulatory Visit: Payer: Self-pay

## 2023-11-03 DIAGNOSIS — I10 Essential (primary) hypertension: Secondary | ICD-10-CM

## 2023-11-03 DIAGNOSIS — K859 Acute pancreatitis without necrosis or infection, unspecified: Secondary | ICD-10-CM

## 2023-11-03 DIAGNOSIS — R7989 Other specified abnormal findings of blood chemistry: Secondary | ICD-10-CM

## 2023-11-03 DIAGNOSIS — K8010 Calculus of gallbladder with chronic cholecystitis without obstruction: Secondary | ICD-10-CM | POA: Diagnosis not present

## 2023-11-03 DIAGNOSIS — K8020 Calculus of gallbladder without cholecystitis without obstruction: Secondary | ICD-10-CM

## 2023-11-03 DIAGNOSIS — K219 Gastro-esophageal reflux disease without esophagitis: Secondary | ICD-10-CM | POA: Diagnosis not present

## 2023-11-03 DIAGNOSIS — F0393 Unspecified dementia, unspecified severity, with mood disturbance: Secondary | ICD-10-CM

## 2023-11-03 DIAGNOSIS — E876 Hypokalemia: Secondary | ICD-10-CM

## 2023-11-03 DIAGNOSIS — E785 Hyperlipidemia, unspecified: Secondary | ICD-10-CM

## 2023-11-03 DIAGNOSIS — K819 Cholecystitis, unspecified: Secondary | ICD-10-CM | POA: Diagnosis not present

## 2023-11-03 LAB — COMPREHENSIVE METABOLIC PANEL WITH GFR
ALT: 334 U/L — ABNORMAL HIGH (ref 0–44)
AST: 379 U/L — ABNORMAL HIGH (ref 15–41)
Albumin: 3.5 g/dL (ref 3.5–5.0)
Alkaline Phosphatase: 92 U/L (ref 38–126)
Anion gap: 8 (ref 5–15)
BUN: 15 mg/dL (ref 8–23)
CO2: 23 mmol/L (ref 22–32)
Calcium: 8.6 mg/dL — ABNORMAL LOW (ref 8.9–10.3)
Chloride: 110 mmol/L (ref 98–111)
Creatinine, Ser: 0.87 mg/dL (ref 0.61–1.24)
GFR, Estimated: >60 mL/min (ref >60)
Glucose, Bld: 99 mg/dL (ref 70–99)
Potassium: 4.4 mmol/L (ref 3.5–5.1)
Sodium: 141 mmol/L (ref 135–145)
Total Bilirubin: 3.5 mg/dL — ABNORMAL HIGH (ref 0.0–1.2)
Total Protein: 6.1 g/dL — ABNORMAL LOW (ref 6.5–8.1)

## 2023-11-03 LAB — CBC
HCT: 34.3 % — ABNORMAL LOW (ref 39.0–52.0)
Hemoglobin: 11.5 g/dL — ABNORMAL LOW (ref 13.0–17.0)
MCH: 32.2 pg (ref 26.0–34.0)
MCHC: 33.5 g/dL (ref 30.0–36.0)
MCV: 96.1 fL (ref 80.0–100.0)
Platelets: 221 10*3/uL (ref 150–400)
RBC: 3.57 MIL/uL — ABNORMAL LOW (ref 4.22–5.81)
RDW: 14.6 % (ref 11.5–15.5)
WBC: 5.2 10*3/uL (ref 4.0–10.5)
nRBC: 0 % (ref 0.0–0.2)

## 2023-11-03 LAB — MAGNESIUM: Magnesium: 2.3 mg/dL (ref 1.7–2.4)

## 2023-11-03 SURGERY — CHOLECYSTECTOMY, ROBOT-ASSISTED, LAPAROSCOPIC
Anesthesia: General | Site: Abdomen

## 2023-11-03 MED ORDER — POTASSIUM CHLORIDE 20 MEQ PO PACK
40.0000 meq | PACK | Freq: Once | ORAL | Status: AC
  Administered 2023-11-03: 40 meq via ORAL
  Filled 2023-11-03: qty 2

## 2023-11-03 MED ORDER — PROPOFOL 10 MG/ML IV BOLUS
INTRAVENOUS | Status: AC
  Filled 2023-11-03: qty 20

## 2023-11-03 MED ORDER — DEXAMETHASONE SODIUM PHOSPHATE 10 MG/ML IJ SOLN
INTRAMUSCULAR | Status: DC | PRN
Start: 2023-11-03 — End: 2023-11-03
  Administered 2023-11-03: 10 mg via INTRAVENOUS

## 2023-11-03 MED ORDER — BUPIVACAINE-EPINEPHRINE 0.25% -1:200000 IJ SOLN
INTRAMUSCULAR | Status: DC | PRN
  Administered 2023-11-03: 20 mL

## 2023-11-03 MED ORDER — ACETAMINOPHEN 10 MG/ML IV SOLN
INTRAVENOUS | Status: AC
Start: 2023-11-03 — End: 2023-11-03
  Filled 2023-11-03: qty 100

## 2023-11-03 MED ORDER — CEFAZOLIN SODIUM-DEXTROSE 2-4 GM/100ML-% IV SOLN
INTRAVENOUS | Status: AC
  Filled 2023-11-03: qty 100

## 2023-11-03 MED ORDER — LIDOCAINE HCL (CARDIAC) PF 100 MG/5ML IV SOSY
PREFILLED_SYRINGE | INTRAVENOUS | Status: DC | PRN
  Administered 2023-11-03: 80 mg via INTRAVENOUS

## 2023-11-03 MED ORDER — FENTANYL CITRATE (PF) 100 MCG/2ML IJ SOLN: 25.0000 ug | INTRAMUSCULAR | Status: DC | PRN

## 2023-11-03 MED ORDER — PROPOFOL 10 MG/ML IV BOLUS
INTRAVENOUS | Status: DC | PRN
  Administered 2023-11-03: 30 mg via INTRAVENOUS
  Administered 2023-11-03: 110 mg via INTRAVENOUS

## 2023-11-03 MED ORDER — OXYCODONE HCL 5 MG PO TABS: 5.0000 mg | ORAL_TABLET | Freq: Once | ORAL | Status: DC | PRN

## 2023-11-03 MED ORDER — ACETAMINOPHEN 10 MG/ML IV SOLN: 1000.0000 mg | Freq: Once | INTRAVENOUS | Status: DC | PRN

## 2023-11-03 MED ORDER — OXYCODONE HCL 5 MG/5ML PO SOLN: 5.0000 mg | Freq: Once | ORAL | Status: DC | PRN

## 2023-11-03 MED ORDER — FENTANYL CITRATE (PF) 100 MCG/2ML IJ SOLN
INTRAMUSCULAR | Status: DC | PRN
  Administered 2023-11-03 (×2): 50 ug via INTRAVENOUS

## 2023-11-03 MED ORDER — LACTATED RINGERS IV SOLN: INTRAVENOUS | Status: DC

## 2023-11-03 MED ORDER — HYDROMORPHONE HCL 1 MG/ML IJ SOLN
INTRAMUSCULAR | Status: DC | PRN
  Administered 2023-11-03: .5 mg via INTRAVENOUS

## 2023-11-03 MED ORDER — SUGAMMADEX SODIUM 200 MG/2ML IV SOLN
INTRAVENOUS | Status: DC | PRN
  Administered 2023-11-03: 200 mg via INTRAVENOUS

## 2023-11-03 MED ORDER — CEFAZOLIN SODIUM-DEXTROSE 2-4 GM/100ML-% IV SOLN
2.0000 g | INTRAVENOUS | Status: AC
  Administered 2023-11-03: 2 g via INTRAVENOUS

## 2023-11-03 MED ORDER — HYDROMORPHONE HCL 1 MG/ML IJ SOLN
INTRAMUSCULAR | Status: AC
  Filled 2023-11-03: qty 1

## 2023-11-03 MED ORDER — INDOCYANINE GREEN 25 MG IV SOLR
1.2500 mg | Freq: Once | INTRAVENOUS | Status: AC
  Administered 2023-11-03: 1.25 mg via INTRAVENOUS

## 2023-11-03 MED ORDER — EPHEDRINE SULFATE-NACL 50-0.9 MG/10ML-% IV SOSY
PREFILLED_SYRINGE | INTRAVENOUS | Status: DC | PRN
  Administered 2023-11-03: 10 mg via INTRAVENOUS

## 2023-11-03 MED ORDER — INDOCYANINE GREEN 25 MG IV SOLR
INTRAVENOUS | Status: AC
  Filled 2023-11-03: qty 10

## 2023-11-03 MED ORDER — ONDANSETRON HCL 4 MG/2ML IJ SOLN
INTRAMUSCULAR | Status: DC | PRN
  Administered 2023-11-03: 4 mg via INTRAVENOUS

## 2023-11-03 MED ORDER — ROCURONIUM BROMIDE 100 MG/10ML IV SOLN
INTRAVENOUS | Status: DC | PRN
  Administered 2023-11-03: 20 mg via INTRAVENOUS
  Administered 2023-11-03: 60 mg via INTRAVENOUS

## 2023-11-03 MED ORDER — ONDANSETRON HCL 4 MG/2ML IJ SOLN
INTRAMUSCULAR | Status: AC
  Filled 2023-11-03: qty 2

## 2023-11-03 MED ORDER — 0.9 % SODIUM CHLORIDE (POUR BTL) OPTIME
TOPICAL | Status: DC | PRN
  Administered 2023-11-03: 500 mL

## 2023-11-03 MED ORDER — ACETAMINOPHEN 10 MG/ML IV SOLN
INTRAVENOUS | Status: DC | PRN
  Administered 2023-11-03: 1000 mg via INTRAVENOUS

## 2023-11-03 MED ORDER — FENTANYL CITRATE (PF) 100 MCG/2ML IJ SOLN
INTRAMUSCULAR | Status: AC
  Filled 2023-11-03: qty 2

## 2023-11-03 SURGICAL SUPPLY — 44 items
BAG PRESSURE INF REUSE 1000 (BAG) IMPLANT
CANNULA REDUCER 12-8 DVNC XI (CANNULA) ×1 IMPLANT
CATH REDDICK CHOLANGI 4FR 50CM (CATHETERS) IMPLANT
CAUTERY HOOK MNPLR 1.6 DVNC XI (INSTRUMENTS) ×1 IMPLANT
CLIP LIGATING HEM O LOK PURPLE (MISCELLANEOUS) IMPLANT
CLIP LIGATING HEMO O LOK GREEN (MISCELLANEOUS) ×1 IMPLANT
DERMABOND ADVANCED .7 DNX12 (GAUZE/BANDAGES/DRESSINGS) ×1 IMPLANT
DRAPE ARM DVNC X/XI (DISPOSABLE) ×4 IMPLANT
DRAPE C-ARM XRAY 36X54 (DRAPES) IMPLANT
DRAPE COLUMN DVNC XI (DISPOSABLE) ×1 IMPLANT
ELECTRODE REM PT RTRN 9FT ADLT (ELECTROSURGICAL) ×1 IMPLANT
FORCEPS BPLR 8 MD DVNC XI (FORCEP) ×1 IMPLANT
FORCEPS BPLR FENES DVNC XI (FORCEP) ×1 IMPLANT
FORCEPS PROGRASP DVNC XI (FORCEP) ×1 IMPLANT
GLOVE BIO SURGEON STRL SZ 6.5 (GLOVE) ×2 IMPLANT
GLOVE BIOGEL PI IND STRL 6.5 (GLOVE) ×2 IMPLANT
GLOVE SURG SYN 6.5 ES PF (GLOVE) ×2 IMPLANT
GLOVE SURG SYN 6.5 PF PI (GLOVE) ×2 IMPLANT
GOWN STRL REUS W/ TWL LRG LVL3 (GOWN DISPOSABLE) ×4 IMPLANT
GRASPER SUT TROCAR 14GX15 (MISCELLANEOUS) ×1 IMPLANT
IRRIGATOR SUCT 8 DISP DVNC XI (IRRIGATION / IRRIGATOR) IMPLANT
IV CATH ANGIO 12GX3 LT BLUE (NEEDLE) IMPLANT
IV NS 1000ML BAXH (IV SOLUTION) IMPLANT
KIT PINK PAD W/HEAD ARM REST (MISCELLANEOUS) ×1 IMPLANT
LABEL OR SOLS (LABEL) ×1 IMPLANT
MANIFOLD NEPTUNE II (INSTRUMENTS) ×1 IMPLANT
NDL HYPO 22X1.5 SAFETY MO (MISCELLANEOUS) ×1 IMPLANT
NDL INSUFFLATION 14GA 120MM (NEEDLE) ×1 IMPLANT
NEEDLE HYPO 22X1.5 SAFETY MO (MISCELLANEOUS) ×1 IMPLANT
NEEDLE INSUFFLATION 14GA 120MM (NEEDLE) ×1 IMPLANT
NS IRRIG 500ML POUR BTL (IV SOLUTION) ×1 IMPLANT
OBTURATOR OPTICALSTD 8 DVNC (TROCAR) ×1 IMPLANT
PACK LAP CHOLECYSTECTOMY (MISCELLANEOUS) ×1 IMPLANT
SEAL UNIV 5-12 XI (MISCELLANEOUS) ×4 IMPLANT
SET TUBE SMOKE EVAC HIGH FLOW (TUBING) ×1 IMPLANT
SOLUTION ELECTROSURG ANTI STCK (MISCELLANEOUS) ×1 IMPLANT
SPIKE FLUID TRANSFER (MISCELLANEOUS) ×2 IMPLANT
SPONGE T-LAP 4X18 ~~LOC~~+RFID (SPONGE) IMPLANT
SUT VIC AB 2-0 UR6 27 (SUTURE) IMPLANT
SUT VICRYL 0 UR6 27IN ABS (SUTURE) ×1 IMPLANT
SUTURE MNCRL 4-0 27XMF (SUTURE) ×1 IMPLANT
SYSTEM BAG RETRIEVAL 10MM (BASKET) ×1 IMPLANT
TRAP FLUID SMOKE EVACUATOR (MISCELLANEOUS) ×1 IMPLANT
WATER STERILE IRR 500ML POUR (IV SOLUTION) ×1 IMPLANT

## 2023-11-03 NOTE — Plan of Care (Signed)

## 2023-11-03 NOTE — Consult Note (Addendum)
 SURGICAL CONSULTATION NOTE   HISTORY OF PRESENT ILLNESS (HPI):  89 y.o. male presented to [facility] ED for evaluation of chest pain.  He initially presented with chest pain that radiated to the upper abdomen.  He thought that he was having a heart attack and went to the ED for further evaluation.  At the ED cardiac etiology was ruled out.  He was discharged home but he came back with upper abdominal pain.  At that time he was workup for abdominal pain etiology.  Pain in the upper abdomen.  Pain radiates to both lower quadrants.  Patient cannot identify any alleviating or aggravating factors.  In the ED he had stable vital signs.  In the initial ED visit he did his cardiac workup was negative with normal troponins and normal EKG.  He also had a CT scan of the head negative for intracranial pathology.  I personally evaluated the images.  On the second visit with abdominal pain he had an abdominal ultrasound and CT scan of the abdomen showing peripancreatic inflammation.  Ultrasound shows gallstones without sign of cholecystitis.  Due to elevated liver enzymes he had MRCP negative for choledocholithiasis.  Upon evaluation this morning patient does feel much better.  He denies abdominal pain today.  Surgery is consulted by Dr. Lawence in this context for evaluation and management of gallstone pancreatitis.  PAST MEDICAL HISTORY (PMH):  Past Medical History:  Diagnosis Date   Actinic keratosis    AV block, 1st degree 10/05/2021   With bradycardia 09/2021 - drop Toprol  XL to 25mg  daily, increase amlodipine  to 10mg  daily. Donepezil  can also cause AV block.      Carotid stenosis, asymptomatic, bilateral 09/16/2016   Diffuse 1-39% bilaterally, but involving ICA, ECA, CCA - rpt 1 yr (09/2016)   Complication of anesthesia    slow to wake up after a colonoscopy years ago   Diverticulitis    Dyslipidemia 09/04/2017   Atorvastatin  elevated CPK levels.      GERD (gastroesophageal reflux disease)    Gout     History of cerebellar stroke 01/09/2022   Cerebellar stroke     History of osteomyelitis 10/2015   left foot   History of squamous cell carcinoma of skin    HTN (hypertension)    Infection of left great toe due to methicillin resistant Staphylococcus aureus (MRSA) 2015   Hx s/p IV abx 7 wks through PICC (~2015)     MCI (mild cognitive impairment) 02/17/2020   MMSE (02/2020) 24/30, 25 with cue. Misses 3 orientation, 2 recall (1 with cue), 1 language. Independent ADLs and IADLs. 4/4 clock drawing test.   MMSE 08/2021 23/30, CDT 4/4     OSA on CPAP 09/01/2016   Osteoarthritis    back, neck, knees - multiple joints   Personal history of malignant neoplasm of larynx 2009   s/p XRT, Skin Cancer- pre cancerous   Pre-diabetes    Prediabetes 01/09/2022   Seasonal allergies    Sensorineural hearing loss (SNHL) of both ears 09/01/2016   Sinus bradycardia 02/07/2022   Sinus bradycardia     Squamous cell carcinoma of skin unknown   Treated in [place] - SCC of the L post auricular    Squamous cell carcinoma of skin 10/26/2022   R med knee, [facility]     PAST SURGICAL HISTORY (PSH):  Past Surgical History:  Procedure Laterality Date   ACHILLES TENDON REPAIR Left    AMPUTATION Left 05/16/2019   LEFT GREAT TOE AMPUTATION for osteomyelitis (  Harden Jerona GAILS, MD), 2nd toe removed   APPENDECTOMY     COLONOSCOPY     THROAT SURGERY N/A 2009   ?vocal cord vs larynx nodule removal, had XRT after this but states it was not cancer   TONSILLECTOMY     TREATMENT FISTULA ANAL       MEDICATIONS:  Prior to Admission medications   Medication Sig Start Date End Date Taking? Authorizing Provider  amLODipine  (NORVASC ) 10 MG tablet Take 1 tablet (10 mg total) by mouth daily. 04/13/23  Yes Rilla Baller, MD  aspirin  EC 81 MG tablet Take 1 tablet (81 mg total) by mouth daily. Swallow whole. 09/23/23  Yes Caleen Qualia, MD  azelastine  (ASTELIN ) 0.1 % nasal spray Place 1 spray into both nostrils 2 (two) times  daily. Use in each nostril as directed 04/13/23  Yes Rilla Baller, MD  cetirizine (ZYRTEC) 10 MG tablet Take 10 mg by mouth daily. Alternates weekly with allegra. OTC   Yes [provider]  clopidogrel  (PLAVIX ) 75 MG tablet Take 1 tablet (75 mg total) by mouth daily. 04/13/23  Yes Rilla Baller, MD  Coenzyme Q10-Vitamin E (QUNOL ULTRA COQ10 PO) Take 1 capsule by mouth daily. OTC   Yes [provider]  cyanocobalamin  (VITAMIN B12) 1000 MCG tablet Take 1 tablet (1,000 mcg total) by mouth daily. Patient taking differently: Take 1,000 mcg by mouth daily. OTC 12/05/22  Yes Rilla Baller, MD  donepezil  (ARICEPT ) 5 MG tablet Take 1 tablet (5 mg total) by mouth at bedtime. 04/13/23  Yes Rilla Baller, MD  fexofenadine (ALLEGRA) 180 MG tablet Take 180 mg by mouth daily. Alternates weekly with zyrtec. OTC   Yes [provider]  hydrALAZINE  (APRESOLINE ) 50 MG tablet Take 1 tablet (50 mg total) by mouth 3 (three) times daily. 04/13/23  Yes Rilla Baller, MD  losartan  (COZAAR ) 50 MG tablet Take 1 tablet (50 mg total) by mouth daily. 04/13/23  Yes Rilla Baller, MD  Misc Natural Products (OSTEO BI-FLEX TRIPLE STRENGTH) TABS Take 1 tablet by mouth daily. + vitamin D . OTC   Yes [provider]  Multiple Vitamins-Minerals (ONE-A-DAY MENS 50+) TABS Take 1 tablet by mouth daily. OTC   Yes [provider]  pantoprazole  (PROTONIX ) 20 MG tablet Take 1 tablet (20 mg total) by mouth daily. 04/13/23  Yes Rilla Baller, MD  polyethylene glycol (MIRALAX  / GLYCOLAX ) packet Take 8.5 g by mouth daily.    Yes [provider]  pravastatin  (PRAVACHOL ) 20 MG tablet Take 1 tablet (20 mg total) by mouth 2 (two) times a week. 04/16/23  Yes Rilla Baller, MD  sertraline  (ZOLOFT ) 25 MG tablet Take 1 tablet (25 mg total) by mouth daily. 09/28/23  Yes Rilla Baller, MD  triamcinolone  cream (KENALOG ) 0.1 % Apply to affected areas rash on right hip twice daily  until improved. Avoid applying to face, groin, and axilla. Use as directed. Long-term use can cause thinning of the skin. 04/19/23  Yes Hester Alm BROCKS, MD     ALLERGIES:  Allergies  Allergen Reactions   Lisinopril Cough   Atorvastatin  Other (See Comments)    Myalgias and mildly elevated CPK      SOCIAL HISTORY:  Social History   Socioeconomic History   Marital status: Married    Spouse name: Not on file   Number of children: Not on file   Years of education: Not on file   Highest education level: Bachelor's degree (e.g., BA, AB, BS)  Occupational History   Not on  file  Tobacco Use   Smoking status: Never   Smokeless tobacco: Never  Vaping Use   Vaping status: Never Used  Substance and Sexual Activity   Alcohol use: Yes    Comment: occasional wine   Drug use: No   Sexual activity: Not Currently  Other Topics Concern   Not on file  Social History Narrative   [name]   Widower. 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   Edu: college chemistry degree   Right handed   Activity: walking 9/10th mile daily    Caffeine: Drinks 1 cup of coffee every morning    Social Drivers of Corporate investment banker Strain: Low Risk  (03/05/2023)   Overall Financial Resource Strain (CARDIA)    Difficulty of Paying Living Expenses: Not hard at all  Food Insecurity: No Food Insecurity (11/02/2023)   Hunger Vital Sign    Worried About Running Out of Food in the Last Year: Never true    Ran Out of Food in the Last Year: Never true  Transportation Needs: No Transportation Needs (11/03/2023)   PRAPARE - Administrator, Civil Service (Medical): No    Lack of Transportation (Non-Medical): No  Physical Activity: Sufficiently Active (03/05/2023)   Exercise Vital Sign    Days of Exercise per Week: 5 days    Minutes of Exercise per Session: 30 min  Stress: No Stress Concern Present (03/05/2023)   [name] of Occupational  Health - Occupational Stress Questionnaire    Feeling of Stress : Not at all  Social Connections: Moderately Integrated (11/02/2023)   Social Connection and Isolation Panel    Frequency of Communication with Friends and Family: Three times a week    Frequency of Social Gatherings with Friends and Family: Three times a week    Attends Religious Services: More than 4 times per year    Active Member of Clubs or Organizations: No    Attends Banker Meetings: Never    Marital Status: Married  Recent Concern: Social Connections - Moderately Isolated (09/21/2023)   Social Connection and Isolation Panel    Frequency of Communication with Friends and Family: Once a week    Frequency of Social Gatherings with Friends and Family: Once a week    Attends Religious Services: More than 4 times per year    Active Member of [employer] or Organizations: No    Attends Banker Meetings: Never    Marital Status: Married  Catering manager Violence: Not At Risk (11/02/2023)   Humiliation, Afraid, Rape, and Kick questionnaire    Fear of Current or Ex-Partner: No    Emotionally Abused: No    Physically Abused: No    Sexually Abused: No      FAMILY HISTORY:  Family History  Problem Relation Age of Onset   Hypertension Mother    Hypertension Father    Cancer Other        unknown - niece   CAD Sister 19       stent (80% blockage)   CAD Brother 89       stent (100% blockage)     REVIEW OF SYSTEMS:  Constitutional: denies weight loss, fever, chills, or sweats  Eyes: denies any other vision changes, history of eye injury  ENT: denies sore throat, hearing problems  Respiratory: denies shortness of breath, wheezing  Cardiovascular: Positive chest pain Gastrointestinal: Positive abdominal pain, nausea and vomiting Genitourinary: denies  burning with urination or urinary frequency Musculoskeletal: denies any other joint pains or cramps  Skin: denies any other rashes or skin  discolorations  Neurological: denies any other headache, dizziness, weakness  Psychiatric: denies any other depression, anxiety   All other review of systems were negative   VITAL SIGNS:  Temp:  [97.6 F (36.4 C)-99.9 F (37.7 C)] 98.6 F (37 C) (06/28 0754) Pulse Rate:  [50-73] 58 (06/28 0754) Resp:  [12-18] 16 (06/28 0754) BP: (129-185)/(53-74) 151/60 (06/28 0754) SpO2:  [95 %-98 %] 97 % (06/28 0754) Weight:  [90.7 kg-93 kg] 93 kg (06/27 2334)     Height: 5' 9 (175.3 cm) Weight: 93 kg BMI (Calculated): 30.26    PHYSICAL EXAM:  Constitutional:  -- Normal body habitus  -- Awake, alert, and oriented x3  Eyes:  -- Pupils equally round and reactive to light  -- No scleral icterus  Ear, nose, and throat:  -- No jugular venous distension  Pulmonary:  -- No crackles  -- Equal breath sounds bilaterally -- Breathing non-labored at rest Cardiovascular:  -- S1, S2 present  -- No pericardial rubs Gastrointestinal:  -- Abdomen soft, nontender, non-distended, no guarding or rebound tenderness -- No abdominal masses appreciated, pulsatile or otherwise  Musculoskeletal and Integumentary:  -- Wounds: None appreciated -- Extremities: B/L UE and LE FROM, hands and feet warm, no edema  Neurologic:  -- Motor function: intact and symmetric -- Sensation: intact and symmetric   Labs:     Latest Ref Rng & Units 11/03/2023    7:39 AM 11/02/2023    3:04 PM 11/02/2023    6:16 AM  CBC  WBC 4.0 - 10.5 K/uL 5.2  6.0  4.4   Hemoglobin 13.0 - 17.0 g/dL 88.4  87.4  88.5   Hematocrit 39.0 - 52.0 % 34.3  36.7  34.0   Platelets 150 - 400 K/uL 221  252  226       Latest Ref Rng & Units 11/03/2023    7:39 AM 11/02/2023    3:04 PM 11/02/2023    6:16 AM  CMP  Glucose 70 - 99 mg/dL 99  792  883   BUN 8 - 23 mg/dL 15  21  23    Creatinine 0.61 - 1.24 mg/dL 9.12  9.11  9.03   Sodium 135 - 145 mmol/L 141  138  140   Potassium 3.5 - 5.1 mmol/L 4.4  3.4  3.5   Chloride 98 - 111 mmol/L 110  104  107    CO2 22 - 32 mmol/L 23  24  24    Calcium  8.9 - 10.3 mg/dL 8.6  8.8  8.7   Total Protein 6.5 - 8.1 g/dL 6.1  6.7  6.2   Total Bilirubin 0.0 - 1.2 mg/dL 3.5  2.0  1.0   Alkaline Phos 38 - 126 U/L 92  73  52   AST 15 - 41 U/L 379  332  84   ALT 0 - 44 U/L 334  198  39     Imaging studies:  I personally evaluated the images of the MRCP  EXAM: MRI ABDOMEN WITHOUT AND WITH CONTRAST (INCLUDING MRCP)   TECHNIQUE: Multiplanar multisequence MR imaging of the abdomen was performed both before and after the administration of intravenous contrast. Heavily T2-weighted images of the biliary and pancreatic ducts were obtained, and three-dimensional MRCP images were rendered by post processing.   CONTRAST:  9mL GADAVIST  GADOBUTROL  1 MMOL/ML IV SOLN   COMPARISON:  11/02/2023  FINDINGS: Lower chest: No acute findings.   Hepatobiliary: No enhancing liver lesions. Multiple stones within the gallbladder are identified measuring up to 6 mm. There is no significant gallbladder wall thickening or pericholecystic inflammation. Mild changes adenomyomatosis noted within the gallbladder fundus.   The common bile duct measures 5 mm. No intrahepatic bile duct dilatation. The MRCP images are motion limited. No signs of choledocholithiasis.   Pancreas: The head and uncinate process of the pancreas appear mildly edematous with a small amount of adjacent fat stranding. No significant free fluid. No signs of pancreatic necrosis or pseudocyst formation. No pancreatic mass identified.   Spleen:  Within normal limits in size and appearance.   Adrenals/Urinary Tract: Normal adrenal glands. Lower pole left kidney cyst containing thin hairline internal septation without measurable enhancement is identified compatible with a Bosniak class 2 kidney cyst, image 18/3. No follow-up imaging recommended. Additional small cortical and parapelvic cysts are noted bilaterally. No signs of obstructive uropathy.    Stomach/Bowel: Visualized portions within the abdomen are unremarkable.   Vascular/Lymphatic: Aortic atherosclerosis. Patent abdominal vascularity. No abdominal adenopathy.   Other:  None.   Musculoskeletal: Thoracolumbar scoliosis and degenerative disc disease noted within the lower thoracic and lumbar spine   IMPRESSION: 1. The head and uncinate process of the pancreas appear mildly edematous with a small amount of adjacent fat stranding. Findings are compatible with acute pancreatitis. No signs of pancreatic necrosis or pseudocyst formation. 2. Cholelithiasis without signs of acute cholecystitis. 3. No bile duct dilatation or signs of choledocholithiasis.     Electronically Signed   By: Waddell Calk M.D.   On: 11/03/2023 04:32  Assessment/Plan:  89 y.o. male with gallstone pancreatitis, complicated by pertinent comorbidities including hypertension, dyslipidemia.  Patient initially presenting with chest pain.  EKG without acute ischemic changes and troponins were within normal limits.  Upon chart review he has been evaluated by cardiology during the last 3 months without any significant cardiac pathology.  Due to the family history he had a Lexiscan  Myoview  stress test that was negative for ischemic pathology of the heart.  He also had an echocardiogram on 09/21/2003 that shows left ventricular action fraction of 55 to 60% no right ventricular systolic dysfunction.  Mitral and aortic bulb grossly normal.  On second visit he was complaining more of upper abdominal pain and he was found to gallstone pancreatitis.  Patient oriented about the diagnosis of gallstone pancreatitis.  The fact that his lipase has not been significantly elevated and the abdominal pain resolved today most likely there was some mild transient stone that passed through the common bile duct.  MRCP rule out active choledocholithiasis.  I discussed the patient recommendation of cholecystectomy due to high chances  of recurrence of calcium  pancreatitis or choledocholithiasis.  He was oriented that the cholecystectomy purposes for preventing recurrent episode of for treating pancreatitis today.  Patient was oriented about the surgical intervention, the risks including bleeding, infection, bile leak, common bile duct injury, injury to adjacent organ among others.  The patient reported he understood and agreed to proceed with robotic assisted laparoscopic cholecystectomy  Lucas Petrin, MD

## 2023-11-03 NOTE — Transfer of Care (Signed)
 Immediate Anesthesia Transfer of Care Note  Patient: [name]  Procedure(s) Performed: CHOLECYSTECTOMY, ROBOT-ASSISTED, LAPAROSCOPIC (Abdomen)  Patient Location: PACU  Anesthesia Type:General  Level of Consciousness: drowsy  Airway & Oxygen Therapy: Patient Spontanous Breathing and Patient connected to face mask oxygen  Post-op Assessment: Report given to RN, Post -op Vital signs reviewed and stable, and Patient moving all extremities  Post vital signs: Reviewed and stable  Last Vitals:  Vitals Value Taken Time  BP 126/67 11/03/23 14:34  Temp    Pulse 70 11/03/23 14:39  Resp 12 11/03/23 14:39  SpO2 97 % 11/03/23 14:39  Vitals shown include unfiled device data.  Last Pain:  Vitals:   11/03/23 0814  TempSrc:   PainSc: 0-No pain      Patients Stated Pain Goal: 0 (11/03/23 0015)  Complications: No notable events documented.

## 2023-11-03 NOTE — Assessment & Plan Note (Signed)
-   Potassium will be replaced and magnesium level will be checked. ?

## 2023-11-03 NOTE — Progress Notes (Signed)
 Mobility Specialist - Progress Note   11/03/23 1039  Mobility  Activity Ambulated with assistance in hallway  Level of Assistance Standby assist, set-up cues, supervision of patient - no hands on  Assistive Device Front wheel walker  Distance Ambulated (ft) 200 ft  Activity Response Tolerated well  Mobility Referral Yes  Mobility visit 1 Mobility  Mobility Specialist Start Time (ACUTE ONLY) 0944  Mobility Specialist Stop Time (ACUTE ONLY) 1013  Mobility Specialist Time Calculation (min) (ACUTE ONLY) 29 min   Pt sitting in recliner on RA upon arrival. Pt STS and ambulates in hallway SBA with no LOB noted. Pt returns to recliner with needs in reach and wife present.   [name]  Mobility Specialist  11/03/23 10:42 AM

## 2023-11-03 NOTE — Anesthesia Procedure Notes (Signed)
 Procedure Name: Intubation Date/Time: 11/03/2023 1:10 PM  Performed by: Landy Francena BIRCH, CRNAPre-anesthesia Checklist: Patient identified, Emergency Drugs available, Suction available and Patient being monitored Patient Re-evaluated:Patient Re-evaluated prior to induction Oxygen Delivery Method: Circle system utilized Preoxygenation: Pre-oxygenation with 100% oxygen Induction Type: IV induction Ventilation: Mask ventilation without difficulty Laryngoscope Size: McGrath and 3 Grade View: Grade I Tube type: Oral Tube size: 7.5 mm Number of attempts: 1 Airway Equipment and Method: Stylet, Oral airway and Bite block Placement Confirmation: ETT inserted through vocal cords under direct vision, positive ETCO2 and breath sounds checked- equal and bilateral Secured at: 22 cm Tube secured with: Tape Dental Injury: Teeth and Oropharynx as per pre-operative assessment

## 2023-11-03 NOTE — Anesthesia Preprocedure Evaluation (Addendum)
 Anesthesia Evaluation  Patient identified by MRN, date of birth, ID band Patient awake    Reviewed: Allergy & Precautions, NPO status , Patient's Chart, lab work & pertinent test results  History of Anesthesia Complications Negative for: history of anesthetic complications  Airway Mallampati: IV   Neck ROM: Full    Dental   Bridge :   Pulmonary sleep apnea and Continuous Positive Airway Pressure Ventilation    Pulmonary exam normal breath sounds clear to auscultation       Cardiovascular hypertension, + Peripheral Vascular Disease (carotid stenosis)  Normal cardiovascular exam+ dysrhythmias (1st degree AVB)  Rhythm:Regular Rate:Normal  ECG 11/02/23:  Sinus bradycardia with 1st degree A-V block with Premature atrial complexes Incomplete right bundle branch block Nonspecific T wave abnormality Prolonged QT  Echo 09/21/23:   1. Left ventricular ejection fraction, by estimation, is 55 to 60%. The left ventricle has normal function. The left ventricle has no regional wall motion abnormalities. There is mild left ventricular hypertrophy. Left ventricular diastolic parameters are consistent with Grade I diastolic dysfunction (impaired relaxation).   2. Right ventricular systolic function is normal. The right ventricular size is normal.   3. The mitral valve is normal in structure. Mild mitral valve regurgitation.   4. The aortic valve is grossly normal. Aortic valve regurgitation is not visualized.   5. Aortic dilatation noted. There is borderline dilatation of the ascending aorta, measuring 38 mm.    Neuro/Psych  PSYCHIATRIC DISORDERS  Depression   Dementia HOH TIA (09/2023; added ASA, allow permissive HTN)CVA (2023 on Plavix )    GI/Hepatic negative GI ROS,,,  Endo/Other  Obesity  Renal/GU negative Renal ROS     Musculoskeletal  (+) Arthritis ,  Gout    Abdominal   Peds  Hematology  (+) Blood dyscrasia, anemia    Anesthesia Other Findings Cardiology note 07/11/23:  HTN (hypertension) History of essential hypertension with blood pressure measured today at 132/60.  He is on amlodipine , hydralazine  and losartan .  Because of first-degree AV block we are avoiding beta-blockers.   Dyslipidemia History of dyslipidemia on Pravachol  with lipid profile performed 03/27/2023 revealing total cholesterol 142, LDL 76 and HDL 39.   Chest pain of uncertain etiology Mr. Balboa had an episode of chest pain evaluated in the ER 12/10/22.  The pain radiated to both his upper extremities.  His evaluation was unrevealing.  He had a recurrent episode of chest pain in [place] in November again without significant findings on workup.  He said no recurrent symptoms.  Because of his family history with 2 siblings that have had stents in their 96s as well as risk factors, after discussion with him about his wishes, we have decided to proceed with outpatient Lexiscan  Myoview  stress testing.   Reproductive/Obstetrics                             Anesthesia Physical Anesthesia Plan  ASA: 3  Anesthesia Plan: General   Post-op Pain Management:    Induction: Intravenous  PONV Risk Score and Plan: 2 and Ondansetron , Dexamethasone  and Treatment may vary due to age or medical condition  Airway Management Planned: Oral ETT  Additional Equipment:   Intra-op Plan:   Post-operative Plan: Extubation in OR  Informed Consent: I have reviewed the patients History and Physical, chart, labs and discussed the procedure including the risks, benefits and alternatives for the proposed anesthesia with the patient or authorized representative who has indicated his/her understanding and  acceptance.     Dental advisory given  Plan Discussed with: CRNA  Anesthesia Plan Comments: (Patient consented for risks of anesthesia including but not limited to:  - adverse reactions to medications - damage to eyes, teeth, lips or  other oral mucosa - nerve damage due to positioning  - sore throat or hoarseness - damage to heart, brain, nerves, lungs, other parts of body or loss of life  Informed patient about role of CRNA in peri- and intra-operative care.  Patient voiced understanding.)        Anesthesia Quick Evaluation

## 2023-11-03 NOTE — Anesthesia Postprocedure Evaluation (Signed)
 Anesthesia Post Note  Patient: [name]  Procedure(s) Performed: CHOLECYSTECTOMY, ROBOT-ASSISTED, LAPAROSCOPIC (Abdomen)  Patient location during evaluation: PACU Anesthesia Type: General Level of consciousness: awake and alert, oriented and patient cooperative Pain management: pain level controlled Vital Signs Assessment: post-procedure vital signs reviewed and stable Respiratory status: spontaneous breathing, nonlabored ventilation and respiratory function stable Cardiovascular status: blood pressure returned to baseline and stable Postop Assessment: adequate PO intake Anesthetic complications: no   No notable events documented.   Last Vitals:  Vitals:   11/03/23 1515 11/03/23 1544  BP: (!) 132/41 (!) 138/54  Pulse: 60 62  Resp: 12 16  Temp: (!) 36.2 C 36.4 C  SpO2: 94% 92%    Last Pain:  Vitals:   11/03/23 1500  TempSrc:   PainSc: Asleep                 Alfonso Ruths

## 2023-11-03 NOTE — Op Note (Signed)
 Preoperative diagnosis: Gallstone pancreatitis  Postoperative diagnosis: Gallstone pancreatitis with Cholecystitis  Procedure: Robotic Assisted Laparoscopic Cholecystectomy.   Anesthesia: GETA   Surgeon: Dr. Cesar Coe  Wound Classification: Clean Contaminated  Indications: Patient is a 89 y.o. male developed upper abdominal and chest pain and on workup was found to have cholelithiasis with a normal common duct and biliary pancreatitis. Robotic Assisted Laparoscopic cholecystectomy was elected.  Findings: Significant pericholecystic edema Critical view of safety achieved Cystic duct and artery identified, ligated and divided Adequate hemostasis  Description of procedure: The patient was placed on the operating table in the supine position. General anesthesia was induced. A time-out was completed verifying correct patient, procedure, site, positioning, and implant(s) and/or special equipment prior to beginning this procedure. An orogastric tube was placed. The abdomen was prepped and draped in the usual sterile fashion.  An incision was made in a natural skin line below the umbilicus.  The fascia was elevated and the Veress needle inserted. Proper position was confirmed by aspiration and saline meniscus test.  The abdomen was insufflated with carbon dioxide to a pressure of 15 mmHg. The patient tolerated insufflation well. A 8-mm trocar was then inserted in optiview fashion.  The laparoscope was inserted and the abdomen inspected. No injuries from initial trocar placement were noted. Additional trocars were then inserted in the following locations: an 8-mm trocar in the left lateral abdomen, and another two 8-mm trocars to the right side of the abdomen 5 cm appart. The umbilical trocar was changed to a 12 mm trocar all under direct visualization. The abdomen was inspected and no abnormalities were found. The table was placed in the reverse Trendelenburg position with the right side up. The  robotic arms were docked and target anatomy identified. Instrument inserted under direct visualization.  Filmy adhesions between the gallbladder and omentum, duodenum and transverse colon were lysed with electrocautery. The dome of the gallbladder was grasped with a prograsp and retracted over the dome of the liver. The infundibulum was also grasped with an atraumatic grasper and retracted toward the right lower quadrant. This maneuver exposed Calot's triangle. The peritoneum overlying the gallbladder infundibulum was then incised and the cystic duct and cystic artery identified and circumferentially dissected. Critical view of safety reviewed before ligating any structure. Firefly images taken to visualize biliary ducts. The cystic duct and cystic artery were then doubly clipped and divided close to the gallbladder.  The gallbladder was then dissected from its peritoneal attachments by electrocautery. Hemostasis was checked and the gallbladder and contained stones were removed using an endoscopic retrieval bag. The gallbladder was passed off the table as a specimen. The gallbladder fossa was copiously irrigated with saline and hemostasis was obtained. There was no evidence of bleeding from the gallbladder fossa or cystic artery or leakage of the bile from the cystic duct stump. Secondary trocars were removed under direct vision. No bleeding was noted. The robotic arms were undoked. The scope was withdrawn and the umbilical trocar removed. The abdomen was allowed to collapse. The fascia of the 12mm trocar sites was closed with figure-of-eight 0 vicryl sutures. The skin was closed with subcuticular sutures of 4-0 monocryl and topical skin adhesive. The orogastric tube was removed.  The patient tolerated the procedure well and was taken to the postanesthesia care unit in stable condition.   Specimen: Gallbladder  Complications: None  EBL: 10 mL

## 2023-11-03 NOTE — Care Management Obs Status (Signed)
 MEDICARE OBSERVATION STATUS NOTIFICATION   Patient Details  Name: [name] MRN: [id number] Date of Birth: 1932/01/07   Medicare Observation Status Notification Given:  Yes    Amarri Satterly W, CMA 11/03/2023, 11:17 AM

## 2023-11-03 NOTE — Progress Notes (Signed)
 PROGRESS NOTE    [name]  FMW:[id number] DOB: 12-Mar-1932 DOA: 11/02/2023 PCP: Rilla Baller, MD  Chief Complaint  Patient presents with   Chest Pain    Hospital Course:  RAYDEL HOSICK is an 89 year old with hypertension, dyslipidemia, OSA on CPAP, prediabetes, osteoarthritis, diverticulitis, who presented to the ED with acute onset epigastric abdominal pain with nausea, vomiting.  Patient previously seen in the ER for chest pain and had negative troponins at that time.  During that visit he was advised to stay for observation but he decided to leave when he felt better. On arrival to the ER this visit he is hypokalemic to 3.4 with elevated AST and ALT with a total bili of 2.0.  Lipase 44.  Imaging reveals edema adjacent to the head and uncinate process of the pancreas suspicious for acute pancreatitis.  Gallstones appreciated on ultrasound without biliary dilation.  MRCP confirms cholelithiasis without evidence of cholecystitis.  General surgery was consulted and is taking the patient to OR today for cholecystectomy.  Subjective: Patient was seen preoperatively this morning.  He is still having significant abdominal tenderness.   Objective: Vitals:   11/02/23 2230 11/02/23 2334 11/03/23 0410 11/03/23 0754  BP: (!) 156/68 (!) 185/68 (!) 129/58 (!) 151/60  Pulse: 66 62 65 (!) 58  Resp: 13 18 18 16   Temp:  98.2 F (36.8 C) 98.7 F (37.1 C) 98.6 F (37 C)  TempSrc:      SpO2: 98% 97% 96% 97%  Weight:  93 kg    Height:  5' 9 (1.753 m)      Intake/Output Summary (Last 24 hours) at 11/03/2023 1359 Last data filed at 11/03/2023 1319 Gross per 24 hour  Intake 993.33 ml  Output --  Net 993.33 ml   Filed Weights   11/02/23 1502 11/02/23 2334  Weight: 90.7 kg 93 kg    Examination: General exam: Appears calm and comfortable, NAD  Respiratory system: No work of breathing, symmetric chest wall expansion Cardiovascular system: S1 & S2 heard, RRR.  Gastrointestinal  system: Abdomen is nondistended, soft, very tender to palpation in the epigastrium and right upper quadrant Neuro: Alert and oriented. No focal neurological deficits. Extremities: Symmetric, expected ROM Skin: No rashes, lesions Psychiatry: Demonstrates appropriate judgement and insight. Mood & affect appropriate for situation.   Assessment & Plan:  Principal Problem:   Symptomatic cholelithiasis Active Problems:   Acute pancreatitis   Essential hypertension   Hypokalemia   Dyslipidemia   Dementia, senile with depression (HCC)   GERD without esophagitis   Elevated LFTs    Symptomatic cholelithiasis without cholecystitis Acute pancreatitis - Plan for cholecystitis today with general surgery, will follow for postoperative milestones - As needed pain control - IV fluids until tolerating p.o.  Chest pain - Appears noncardiac.  Initial ED visit for chest pain with flat troponins. - Presently denies any chest pain.  Pain is localized to epigastrium  Hypokalemia - Continue to replace as needed  Hypertension - Continue antihypertensives, titrate as needed  Dyslipidemia - Hold statins given current LFT elevation. - Trend CMP  GERD without esophagitis - Continue PPI  Dementia, senile with depression - Continue home Aricept  and Zoloft   History of TIA - Continue aspirin /statin  OSA - CPAP at night  DVT prophylaxis: lovenox   Code Status: Full Code Disposition:  Observation until meeting post op milestones  Consultants:  Treatment Team:  Consulting Physician: Rodolph Romano, MD  Procedures:    Antimicrobials:  Anti-infectives (From admission, onward)  Start     Dose/Rate Route Frequency Ordered Stop   11/03/23 0828  ceFAZolin  (ANCEF ) IVPB 2g/100 mL premix        2 g 200 mL/hr over 30 Minutes Intravenous 30 min pre-op 11/03/23 0826 11/03/23 1325       Data Reviewed: I have personally reviewed following labs and imaging studies CBC: Recent Labs   Lab 11/02/23 0616 11/02/23 1504 11/03/23 0739  WBC 4.4 6.0 5.2  NEUTROABS 2.9  --   --   HGB 11.4* 12.5* 11.5*  HCT 34.0* 36.7* 34.3*  MCV 95.2 96.1 96.1  PLT 226 252 221   Basic Metabolic Panel: Recent Labs  Lab 11/02/23 0616 11/02/23 1504 11/03/23 0739  NA 140 138 141  K 3.5 3.4* 4.4  CL 107 104 110  CO2 24 24 23   GLUCOSE 116* 207* 99  BUN 23 21 15   CREATININE 0.96 0.88 0.87  CALCIUM  8.7* 8.8* 8.6*  MG  --   --  2.3   GFR: Estimated Creatinine Clearance: 64.8 mL/min (by C-G formula based on SCr of 0.87 mg/dL). Liver Function Tests: Recent Labs  Lab 11/02/23 0616 11/02/23 1504 11/03/23 0739  AST 84* 332* 379*  ALT 39 198* 334*  ALKPHOS 52 73 92  BILITOT 1.0 2.0* 3.5*  PROT 6.2* 6.7 6.1*  ALBUMIN 3.5 3.8 3.5   CBG: No results for input(s): GLUCAP in the last 168 hours.  No results found for this or any previous visit (from the past 240 hours).   Radiology Studies: MR ABDOMEN MRCP W WO CONTAST Result Date: 11/03/2023 CLINICAL DATA:  Acute pancreatitis. EXAM: MRI ABDOMEN WITHOUT AND WITH CONTRAST (INCLUDING MRCP) TECHNIQUE: Multiplanar multisequence MR imaging of the abdomen was performed both before and after the administration of intravenous contrast. Heavily T2-weighted images of the biliary and pancreatic ducts were obtained, and three-dimensional MRCP images were rendered by post processing. CONTRAST:  9mL GADAVIST  GADOBUTROL  1 MMOL/ML IV SOLN COMPARISON:  11/02/2023 FINDINGS: Lower chest: No acute findings. Hepatobiliary: No enhancing liver lesions. Multiple stones within the gallbladder are identified measuring up to 6 mm. There is no significant gallbladder wall thickening or pericholecystic inflammation. Mild changes adenomyomatosis noted within the gallbladder fundus. The common bile duct measures 5 mm. No intrahepatic bile duct dilatation. The MRCP images are motion limited. No signs of choledocholithiasis. Pancreas: The head and uncinate process of the  pancreas appear mildly edematous with a small amount of adjacent fat stranding. No significant free fluid. No signs of pancreatic necrosis or pseudocyst formation. No pancreatic mass identified. Spleen:  Within normal limits in size and appearance. Adrenals/Urinary Tract: Normal adrenal glands. Lower pole left kidney cyst containing thin hairline internal septation without measurable enhancement is identified compatible with a Bosniak class 2 kidney cyst, image 18/3. No follow-up imaging recommended. Additional small cortical and parapelvic cysts are noted bilaterally. No signs of obstructive uropathy. Stomach/Bowel: Visualized portions within the abdomen are unremarkable. Vascular/Lymphatic: Aortic atherosclerosis. Patent abdominal vascularity. No abdominal adenopathy. Other:  None. Musculoskeletal: Thoracolumbar scoliosis and degenerative disc disease noted within the lower thoracic and lumbar spine IMPRESSION: 1. The head and uncinate process of the pancreas appear mildly edematous with a small amount of adjacent fat stranding. Findings are compatible with acute pancreatitis. No signs of pancreatic necrosis or pseudocyst formation. 2. Cholelithiasis without signs of acute cholecystitis. 3. No bile duct dilatation or signs of choledocholithiasis. Electronically Signed   By: Waddell Calk M.D.   On: 11/03/2023 04:32   MR 3D Recon At Scanner  Result Date: 11/03/2023 CLINICAL DATA:  Acute pancreatitis. EXAM: MRI ABDOMEN WITHOUT AND WITH CONTRAST (INCLUDING MRCP) TECHNIQUE: Multiplanar multisequence MR imaging of the abdomen was performed both before and after the administration of intravenous contrast. Heavily T2-weighted images of the biliary and pancreatic ducts were obtained, and three-dimensional MRCP images were rendered by post processing. CONTRAST:  9mL GADAVIST  GADOBUTROL  1 MMOL/ML IV SOLN COMPARISON:  11/02/2023 FINDINGS: Lower chest: No acute findings. Hepatobiliary: No enhancing liver lesions. Multiple  stones within the gallbladder are identified measuring up to 6 mm. There is no significant gallbladder wall thickening or pericholecystic inflammation. Mild changes adenomyomatosis noted within the gallbladder fundus. The common bile duct measures 5 mm. No intrahepatic bile duct dilatation. The MRCP images are motion limited. No signs of choledocholithiasis. Pancreas: The head and uncinate process of the pancreas appear mildly edematous with a small amount of adjacent fat stranding. No significant free fluid. No signs of pancreatic necrosis or pseudocyst formation. No pancreatic mass identified. Spleen:  Within normal limits in size and appearance. Adrenals/Urinary Tract: Normal adrenal glands. Lower pole left kidney cyst containing thin hairline internal septation without measurable enhancement is identified compatible with a Bosniak class 2 kidney cyst, image 18/3. No follow-up imaging recommended. Additional small cortical and parapelvic cysts are noted bilaterally. No signs of obstructive uropathy. Stomach/Bowel: Visualized portions within the abdomen are unremarkable. Vascular/Lymphatic: Aortic atherosclerosis. Patent abdominal vascularity. No abdominal adenopathy. Other:  None. Musculoskeletal: Thoracolumbar scoliosis and degenerative disc disease noted within the lower thoracic and lumbar spine IMPRESSION: 1. The head and uncinate process of the pancreas appear mildly edematous with a small amount of adjacent fat stranding. Findings are compatible with acute pancreatitis. No signs of pancreatic necrosis or pseudocyst formation. 2. Cholelithiasis without signs of acute cholecystitis. 3. No bile duct dilatation or signs of choledocholithiasis. Electronically Signed   By: Waddell Calk M.D.   On: 11/03/2023 04:32   CT Head Wo Contrast Result Date: 11/02/2023 CLINICAL DATA:  Dizziness and heaviness in center of chest EXAM: CT HEAD WITHOUT CONTRAST TECHNIQUE: Contiguous axial images were obtained from the base  of the skull through the vertex without intravenous contrast. RADIATION DOSE REDUCTION: This exam was performed according to the departmental dose-optimization program which includes automated exposure control, adjustment of the mA and/or kV according to patient size and/or use of iterative reconstruction technique. COMPARISON:  CT and MRI 09/21/2018 FINDINGS: Brain: No intracranial hemorrhage, mass effect, or evidence of acute infarct. No hydrocephalus. No extra-axial fluid collection. Generalized cerebral atrophy and chronic small vessel ischemic disease. Vascular: No hyperdense vessel. Intracranial arterial calcification. Skull: No fracture or focal lesion. Sinuses/Orbits: No acute finding. Other: None. IMPRESSION: No acute intracranial abnormality. Electronically Signed   By: Norman Gatlin M.D.   On: 11/02/2023 18:51   CT ABDOMEN PELVIS W CONTRAST Result Date: 11/02/2023 CLINICAL DATA:  89 year old with abdominal pain. EXAM: CT ABDOMEN AND PELVIS WITH CONTRAST TECHNIQUE: Multidetector CT imaging of the abdomen and pelvis was performed using the standard protocol following bolus administration of intravenous contrast. RADIATION DOSE REDUCTION: This exam was performed according to the departmental dose-optimization program which includes automated exposure control, adjustment of the mA and/or kV according to patient size and/or use of iterative reconstruction technique. CONTRAST:  OMNIPAQUE  IOHEXOL  300 MG/ML  SOLN COMPARISON:  CT 12/10/2022. Right upper quadrant ultrasound earlier today. FINDINGS: Lower chest: Areas of atelectasis in the left greater than right lower lobe. No pleural fluid. There are coronary artery calcifications. Hepatobiliary: No focal liver abnormality. Gallstones on  ultrasound earlier today are not well-defined by CT. There is no biliary dilatation. Pancreas: There is minimal edema adjacent to the head and uncinate process of the pancreas, series 2, image 37. No pancreatic ductal  dilatation or evidence of pancreatic mass. Spleen: Normal in size without focal abnormality. Adrenals/Urinary Tract: Normal adrenal glands. No hydronephrosis or renal inflammation. Punctate nonobstructing stone in the lower left kidney. Left renal cyst. No further follow-up imaging is recommended. Unremarkable urinary bladder. Stomach/Bowel: Unremarkable appearance of the stomach. No bowel dilatation or evidence of obstruction. The appendix is not confidently visualized. Moderate volume of stool in the colon. Left colonic diverticulosis. No diverticulitis. Sigmoid colon is redundant. Vascular/Lymphatic: Dense aortic atherosclerosis. Moderate branch atherosclerosis. No aneurysm. Patent portal vein. No suspicious lymphadenopathy. Reproductive: Prostate is unremarkable. Other: No free air, free fluid, or intra-abdominal fluid collection. Small fat containing umbilical hernia. Musculoskeletal: Scoliosis and degenerative change throughout the spine. There are no acute or suspicious osseous abnormalities. IMPRESSION: 1. Minimal edema adjacent to the head and uncinate process of the pancreas, suspicious for acute pancreatitis. Recommend correlation with pancreatic enzymes. 2. Gallstones on ultrasound earlier today are not well-defined by CT. No biliary dilatation. 3. Punctate nonobstructing left renal stone. 4. Colonic diverticulosis without diverticulitis. Aortic Atherosclerosis (ICD10-I70.0). Electronically Signed   By: Andrea Gasman M.D.   On: 11/02/2023 18:51   US  ABDOMEN LIMITED RUQ (LIVER/GB) Result Date: 11/02/2023 CLINICAL DATA:  Right upper quadrant pain for 1 day. EXAM: ULTRASOUND ABDOMEN LIMITED RIGHT UPPER QUADRANT COMPARISON:  12/10/2022 FINDINGS: Gallbladder: Gallstones identified measuring up to 6 mm. No gallbladder wall thickening, pericholecystic fluid or sonographic Murphy's sign. Common bile duct: Diameter: 4.2 mm.  No intrahepatic bile duct dilatation. Liver: No focal lesion identified. Within  normal limits in parenchymal echogenicity. Portal vein is patent on color Doppler imaging with normal direction of blood flow towards the liver. Other: None. IMPRESSION: Cholelithiasis without secondary signs of acute cholecystitis. Electronically Signed   By: Waddell Calk M.D.   On: 11/02/2023 08:33   DG Chest Portable 1 View Result Date: 11/02/2023 CLINICAL DATA:  Chest pain. EXAM: PORTABLE CHEST 1 VIEW COMPARISON:  12/10/2022 FINDINGS: The heart size and mediastinal contours are within normal limits. Aortic atherosclerotic calcifications. Unchanged, chronic bronchial wall thickening. No pleural fluid, interstitial edema or airspace disease. The visualized skeletal structures are unremarkable. IMPRESSION: 1. No acute findings. 2. Chronic bronchial wall thickening. Electronically Signed   By: Waddell Calk M.D.   On: 11/02/2023 07:00    Scheduled Meds:  [MAR Hold] amLODipine   10 mg Oral Daily   [MAR Hold] azelastine   1 spray Each Nare BID   [MAR Hold] cetirizine  10 mg Oral Daily   [MAR Hold] donepezil   5 mg Oral QHS   [MAR Hold] enoxaparin (LOVENOX) injection  40 mg Subcutaneous Q24H   [MAR Hold] hydrALAZINE   50 mg Oral TID   [MAR Hold] losartan   50 mg Oral Daily   [MAR Hold] multivitamin with minerals  1 tablet Oral Daily   [MAR Hold] pantoprazole   20 mg Oral Daily   [MAR Hold] polyethylene glycol  8.5 g Oral Daily   [MAR Hold] sertraline   25 mg Oral Daily   Continuous Infusions:  sodium chloride  100 mL/hr at 11/03/23 1258     LOS: 0 days  MDM: Patient is high risk for one or more organ failure.  They necessitate ongoing hospitalization for continued IV therapies and subsequent lab monitoring. Total time spent interpreting labs and vitals, reviewing the medical record, coordinating care  amongst consultants and care team members, directly assessing and discussing care with the patient and/or family: 55 min  Wakisha Alberts, DO [facility]  To contact the attending physician  between 7A-7P please use Epic Chat. To contact the covering physician during after hours 7P-7A, please review Amion.  11/03/2023, 1:59 PM   *This document has been created with the assistance of dictation software. Please excuse typographical errors. *

## 2023-11-03 NOTE — Plan of Care (Signed)
  Problem: Education: Goal: Knowledge of General Education information will improve Description: Including pain rating scale, medication(s)/side effects and non-pharmacologic comfort measures Outcome: Progressing  Pt had robotic assisted laparoscopic cholecystectomy performed by Dr Rodolph this shift Problem: Health Behavior/Discharge Planning: Goal: Ability to manage health-related needs will improve Outcome: Progressing   Problem: Clinical Measurements: Goal: Ability to maintain clinical measurements within normal limits will improve Outcome: Progressing Goal: Will remain free from infection Outcome: Progressing   Problem: Activity: Goal: Risk for activity intolerance will decrease Outcome: Progressing   Problem: Nutrition: Goal: Adequate nutrition will be maintained Outcome: Progressing   Problem: Coping: Goal: Level of anxiety will decrease Outcome: Progressing   Problem: Elimination: Goal: Will not experience complications related to bowel motility Outcome: Progressing Goal: Will not experience complications related to urinary retention Outcome: Progressing   Problem: Pain Managment: Goal: General experience of comfort will improve and/or be controlled Outcome: Progressing   Problem: Safety: Goal: Ability to remain free from injury will improve Outcome: Progressing   Problem: Skin Integrity: Goal: Risk for impaired skin integrity will decrease Outcome: Progressing

## 2023-11-03 NOTE — Progress Notes (Signed)
 Per Dr Marquette Sites, dc tele monitoring

## 2023-11-04 DIAGNOSIS — I10 Essential (primary) hypertension: Secondary | ICD-10-CM | POA: Diagnosis not present

## 2023-11-04 DIAGNOSIS — R7989 Other specified abnormal findings of blood chemistry: Secondary | ICD-10-CM | POA: Diagnosis not present

## 2023-11-04 DIAGNOSIS — K859 Acute pancreatitis without necrosis or infection, unspecified: Secondary | ICD-10-CM | POA: Diagnosis not present

## 2023-11-04 DIAGNOSIS — K8020 Calculus of gallbladder without cholecystitis without obstruction: Secondary | ICD-10-CM | POA: Diagnosis not present

## 2023-11-04 MED ORDER — HYDROCODONE-ACETAMINOPHEN 5-325 MG PO TABS: 1.0000 | ORAL_TABLET | Freq: Four times a day (QID) | ORAL | 0 refills | Status: AC | PRN

## 2023-11-04 NOTE — Progress Notes (Signed)
 Pt given discharge papers. Pt and wife verbalizes understanding. IV removed. No questions or concerns expressed. Pt taken to front lobby by NT via wheelchair.

## 2023-11-04 NOTE — Plan of Care (Signed)

## 2023-11-04 NOTE — Discharge Instructions (Signed)

## 2023-11-04 NOTE — Progress Notes (Signed)
 Patient ID: [name], male   DOB: Nov 10, 1931, 89 y.o.   MRN: [id number]     SURGICAL PROGRESS NOTE   Hospital Day(s): 0.   Interval History: Patient seen and examined, no acute events or new complaints overnight. Patient reports feeling well this morning.  He endorses that the pain is controlled.  He tolerated soft diet.  He denies any nausea or vomiting.  He feels more comfortable than before surgery.  Vital signs in last 24 hours: [min-max] current  Temp:  [97 F (36.1 C)-97.9 F (36.6 C)] 97.8 F (36.6 C) (06/29 0416) Pulse Rate:  [59-78] 66 (06/29 0416) Resp:  [10-18] 18 (06/29 0416) BP: (126-148)/(41-67) 131/67 (06/29 0416) SpO2:  [92 %-100 %] 95 % (06/29 0416)     Height: 5' 9 (175.3 cm) Weight: 93 kg BMI (Calculated): 30.26   Physical Exam:  Constitutional: alert, cooperative and no distress  Respiratory: breathing non-labored at rest  Cardiovascular: regular rate and sinus rhythm  Gastrointestinal: soft, non-tender, and non-distended  Labs:     Latest Ref Rng & Units 11/03/2023    7:39 AM 11/02/2023    3:04 PM 11/02/2023    6:16 AM  CBC  WBC 4.0 - 10.5 K/uL 5.2  6.0  4.4   Hemoglobin 13.0 - 17.0 g/dL 88.4  87.4  88.5   Hematocrit 39.0 - 52.0 % 34.3  36.7  34.0   Platelets 150 - 400 K/uL 221  252  226       Latest Ref Rng & Units 11/03/2023    7:39 AM 11/02/2023    3:04 PM 11/02/2023    6:16 AM  CMP  Glucose 70 - 99 mg/dL 99  792  883   BUN 8 - 23 mg/dL 15  21  23    Creatinine 0.61 - 1.24 mg/dL 9.12  9.11  9.03   Sodium 135 - 145 mmol/L 141  138  140   Potassium 3.5 - 5.1 mmol/L 4.4  3.4  3.5   Chloride 98 - 111 mmol/L 110  104  107   CO2 22 - 32 mmol/L 23  24  24    Calcium  8.9 - 10.3 mg/dL 8.6  8.8  8.7   Total Protein 6.5 - 8.1 g/dL 6.1  6.7  6.2   Total Bilirubin 0.0 - 1.2 mg/dL 3.5  2.0  1.0   Alkaline Phos 38 - 126 U/L 92  73  52   AST 15 - 41 U/L 379  332  84   ALT 0 - 44 U/L 334  198  39     Imaging studies: No new pertinent imaging  studies   Assessment/Plan:  89 y.o. male with gallstone pancreatitis with cholecystitis 1 Day Post-Op s/p robotic assisted laparoscopic cholecystectomy.  - Patient doing well on postoperative day #1.  No fever or tachycardia - Pain under control - Patient tolerating diet - No sign of complications - No contraindication to discharge from surgical standpoint.  I will follow-up in 2 weeks in my office. - Patient oriented about low-fat diet and avoiding heavy lifting for the next 2 weeks  Lucas Petrin, MD

## 2023-11-04 NOTE — Discharge Summary (Signed)
 Physician Discharge Summary  [name] FMW:[id number] DOB: 1931-08-31 DOA: 11/02/2023  PCP: Rilla Baller, MD  Admit date: 11/02/2023 Discharge date: 11/04/2023   Recommendations for Outpatient Follow-up:  Follow up with PCP in 1 week to repeat CMP and ensure LFTs are downtrending to normal Follow-up with general surgery in 2 weeks for postop check  Hospital Course: [name] is an 89 year old with hypertension, dyslipidemia, OSA on CPAP, prediabetes, osteoarthritis, diverticulitis, who presented to the ED with acute onset epigastric abdominal pain with nausea, vomiting.  Patient previously seen in the ER for chest pain and had negative troponins at that time.  During that visit he was advised to stay for observation but he decided to leave when he felt better. On arrival to the ER this visit he is hypokalemic to 3.4 with elevated AST and ALT with a total bili of 2.0.  Lipase 44.  Imaging reveals edema adjacent to the head and uncinate process of the pancreas suspicious for acute pancreatitis.  Gallstones appreciated on ultrasound without biliary dilation.  MRCP confirms cholelithiasis without evidence of cholecystitis.  General surgery was consulted took patient for robotic assisted laparoscopic cholecystectomy on 6/28.  Postoperative course was uncomplicated.  Patient did have some elevation in LFTs on 6/29 but was meeting all postoperative milestones and anxious to return home. He will follow-up with his primary care physician in the clinic within the week to repeat CMP and ensure that LFTs have downtrended to normal.  He was provided postoperative instructions and pain medications at discharge.  He was provided referral information to follow-up with general surgery in 2 weeks.     Symptomatic cholelithiasis without cholecystitis Acute pancreatitis -6/28 robot-assisted laparoscopic cholecystectomy - Denies acute pain this morning - Tolerating diet   Chest pain - Appears  noncardiac, suspect secondary to pancreatitis.  Initial ED visit for chest pain with flat troponins. - Presently denies any chest pain.  Pain is localized to epigastrium   Hypokalemia - Replaced   Hypertension - Resume home meds   Dyslipidemia - Resume home meds    GERD without esophagitis - Continue PPI   Dementia, senile with depression - Continue home Aricept  and Zoloft    History of TIA - Continue aspirin /statin   OSA - CPAP at night  Discharge Instructions  Discharge Instructions     Call MD for:  difficulty breathing, headache or visual disturbances   Complete by: As directed    Call MD for:  persistant dizziness or light-headedness   Complete by: As directed    Call MD for:  persistant nausea and vomiting   Complete by: As directed    Call MD for:  severe uncontrolled pain   Complete by: As directed    Call MD for:  temperature >100.4   Complete by: As directed    Diet general   Complete by: As directed    Discharge instructions   Complete by: As directed    Follow-up with primary care in 1 week to recheck your liver function tests and ensure that they have returned to normal.  Return to the emergency room if you begin experiencing fever, nausea, vomiting, or abdominal pain.   Increase activity slowly   Complete by: As directed    No wound care   Complete by: As directed       Allergies as of 11/04/2023       Reactions   Lisinopril Cough   Atorvastatin  Other (See Comments)   Myalgias and mildly elevated CPK  Medication List     STOP taking these medications    fexofenadine 180 MG tablet Commonly known as: ALLEGRA       TAKE these medications    amLODipine  10 MG tablet Commonly known as: NORVASC  Take 1 tablet (10 mg total) by mouth daily.   aspirin  EC 81 MG tablet Take 1 tablet (81 mg total) by mouth daily. Swallow whole.   azelastine  0.1 % nasal spray Commonly known as: ASTELIN  Place 1 spray into both nostrils 2 (two)  times daily. Use in each nostril as directed   cetirizine 10 MG tablet Commonly known as: ZYRTEC Take 10 mg by mouth daily. Alternates weekly with allegra. OTC   clopidogrel  75 MG tablet Commonly known as: PLAVIX  Take 1 tablet (75 mg total) by mouth daily.   cyanocobalamin  1000 MCG tablet Commonly known as: VITAMIN B12 Take 1 tablet (1,000 mcg total) by mouth daily. What changed: additional instructions   donepezil  5 MG tablet Commonly known as: ARICEPT  Take 1 tablet (5 mg total) by mouth at bedtime.   hydrALAZINE  50 MG tablet Commonly known as: APRESOLINE  Take 1 tablet (50 mg total) by mouth 3 (three) times daily.   HYDROcodone -acetaminophen  5-325 MG tablet Commonly known as: NORCO/VICODIN Take 1 tablet by mouth every 6 (six) hours as needed for up to 3 days.   losartan  50 MG tablet Commonly known as: COZAAR  Take 1 tablet (50 mg total) by mouth daily.   One-A-Day Mens 50+ Tabs Take 1 tablet by mouth daily. OTC   Osteo Bi-Flex Triple Strength Tabs Take 1 tablet by mouth daily. + vitamin D . OTC   pantoprazole  20 MG tablet Commonly known as: PROTONIX  Take 1 tablet (20 mg total) by mouth daily.   polyethylene glycol 17 g packet Commonly known as: MIRALAX  / GLYCOLAX  Take 8.5 g by mouth daily.   pravastatin  20 MG tablet Commonly known as: PRAVACHOL  Take 1 tablet (20 mg total) by mouth 2 (two) times a week.   QUNOL ULTRA COQ10 PO Take 1 capsule by mouth daily. OTC   sertraline  25 MG tablet Commonly known as: ZOLOFT  Take 1 tablet (25 mg total) by mouth daily.   triamcinolone  cream 0.1 % Commonly known as: KENALOG  Apply to affected areas rash on right hip twice daily until improved. Avoid applying to face, groin, and axilla. Use as directed. Long-term use can cause thinning of the skin.        Follow-up Information     Rilla Baller, MD Follow up.   Specialty: Family Medicine Why: Hospital follow up Contact information: [address]  [place] [address] [phone]         Rodolph Romano, MD Follow up in 2 week(s).   Specialty: General Surgery Why: Follo up after cholecystectomy Contact information: [address] [place] [address] [phone]                Allergies  Allergen Reactions   Lisinopril Cough   Atorvastatin  Other (See Comments)    Myalgias and mildly elevated CPK     Consultations: Treatment Team:  Rodolph Romano, MD   Procedures/Studies: MR ABDOMEN MRCP W WO CONTAST Result Date: 11/03/2023 CLINICAL DATA:  Acute pancreatitis. EXAM: MRI ABDOMEN WITHOUT AND WITH CONTRAST (INCLUDING MRCP) TECHNIQUE: Multiplanar multisequence MR imaging of the abdomen was performed both before and after the administration of intravenous contrast. Heavily T2-weighted images of the biliary and pancreatic ducts were obtained, and three-dimensional MRCP images were rendered by post processing. CONTRAST:  9mL GADAVIST  GADOBUTROL  1 MMOL/ML IV SOLN COMPARISON:  11/02/2023 FINDINGS: Lower chest: No acute findings. Hepatobiliary: No enhancing liver lesions. Multiple stones within the gallbladder are identified measuring up to 6 mm. There is no significant gallbladder wall thickening or pericholecystic inflammation. Mild changes adenomyomatosis noted within the gallbladder fundus. The common bile duct measures 5 mm. No intrahepatic bile duct dilatation. The MRCP images are motion limited. No signs of choledocholithiasis. Pancreas: The head and uncinate process of the pancreas appear mildly edematous with a small amount of adjacent fat stranding. No significant free fluid. No signs of pancreatic necrosis or pseudocyst formation. No pancreatic mass identified. Spleen:  Within normal limits in size and appearance. Adrenals/Urinary Tract: Normal adrenal glands. Lower pole left kidney cyst containing thin hairline internal septation without measurable enhancement is identified compatible with a  Bosniak class 2 kidney cyst, image 18/3. No follow-up imaging recommended. Additional small cortical and parapelvic cysts are noted bilaterally. No signs of obstructive uropathy. Stomach/Bowel: Visualized portions within the abdomen are unremarkable. Vascular/Lymphatic: Aortic atherosclerosis. Patent abdominal vascularity. No abdominal adenopathy. Other:  None. Musculoskeletal: Thoracolumbar scoliosis and degenerative disc disease noted within the lower thoracic and lumbar spine IMPRESSION: 1. The head and uncinate process of the pancreas appear mildly edematous with a small amount of adjacent fat stranding. Findings are compatible with acute pancreatitis. No signs of pancreatic necrosis or pseudocyst formation. 2. Cholelithiasis without signs of acute cholecystitis. 3. No bile duct dilatation or signs of choledocholithiasis. Electronically Signed   By: Waddell Calk M.D.   On: 11/03/2023 04:32   MR 3D Recon At Scanner Result Date: 11/03/2023 CLINICAL DATA:  Acute pancreatitis. EXAM: MRI ABDOMEN WITHOUT AND WITH CONTRAST (INCLUDING MRCP) TECHNIQUE: Multiplanar multisequence MR imaging of the abdomen was performed both before and after the administration of intravenous contrast. Heavily T2-weighted images of the biliary and pancreatic ducts were obtained, and three-dimensional MRCP images were rendered by post processing. CONTRAST:  9mL GADAVIST  GADOBUTROL  1 MMOL/ML IV SOLN COMPARISON:  11/02/2023 FINDINGS: Lower chest: No acute findings. Hepatobiliary: No enhancing liver lesions. Multiple stones within the gallbladder are identified measuring up to 6 mm. There is no significant gallbladder wall thickening or pericholecystic inflammation. Mild changes adenomyomatosis noted within the gallbladder fundus. The common bile duct measures 5 mm. No intrahepatic bile duct dilatation. The MRCP images are motion limited. No signs of choledocholithiasis. Pancreas: The head and uncinate process of the pancreas appear mildly  edematous with a small amount of adjacent fat stranding. No significant free fluid. No signs of pancreatic necrosis or pseudocyst formation. No pancreatic mass identified. Spleen:  Within normal limits in size and appearance. Adrenals/Urinary Tract: Normal adrenal glands. Lower pole left kidney cyst containing thin hairline internal septation without measurable enhancement is identified compatible with a Bosniak class 2 kidney cyst, image 18/3. No follow-up imaging recommended. Additional small cortical and parapelvic cysts are noted bilaterally. No signs of obstructive uropathy. Stomach/Bowel: Visualized portions within the abdomen are unremarkable. Vascular/Lymphatic: Aortic atherosclerosis. Patent abdominal vascularity. No abdominal adenopathy. Other:  None. Musculoskeletal: Thoracolumbar scoliosis and degenerative disc disease noted within the lower thoracic and lumbar spine IMPRESSION: 1. The head and uncinate process of the pancreas appear mildly edematous with a small amount of adjacent fat stranding. Findings are compatible with acute pancreatitis. No signs of pancreatic necrosis or pseudocyst formation. 2. Cholelithiasis without signs of acute cholecystitis. 3. No bile duct dilatation or signs of choledocholithiasis. Electronically Signed   By: Waddell Calk M.D.   On: 11/03/2023 04:32  CT Head Wo Contrast Result Date: 11/02/2023 CLINICAL DATA:  Dizziness and heaviness in center of chest EXAM: CT HEAD WITHOUT CONTRAST TECHNIQUE: Contiguous axial images were obtained from the base of the skull through the vertex without intravenous contrast. RADIATION DOSE REDUCTION: This exam was performed according to the departmental dose-optimization program which includes automated exposure control, adjustment of the mA and/or kV according to patient size and/or use of iterative reconstruction technique. COMPARISON:  CT and MRI 09/21/2018 FINDINGS: Brain: No intracranial hemorrhage, mass effect, or evidence of  acute infarct. No hydrocephalus. No extra-axial fluid collection. Generalized cerebral atrophy and chronic small vessel ischemic disease. Vascular: No hyperdense vessel. Intracranial arterial calcification. Skull: No fracture or focal lesion. Sinuses/Orbits: No acute finding. Other: None. IMPRESSION: No acute intracranial abnormality. Electronically Signed   By: Norman Gatlin M.D.   On: 11/02/2023 18:51   CT ABDOMEN PELVIS W CONTRAST Result Date: 11/02/2023 CLINICAL DATA:  89 year old with abdominal pain. EXAM: CT ABDOMEN AND PELVIS WITH CONTRAST TECHNIQUE: Multidetector CT imaging of the abdomen and pelvis was performed using the standard protocol following bolus administration of intravenous contrast. RADIATION DOSE REDUCTION: This exam was performed according to the departmental dose-optimization program which includes automated exposure control, adjustment of the mA and/or kV according to patient size and/or use of iterative reconstruction technique. CONTRAST:  OMNIPAQUE  IOHEXOL  300 MG/ML  SOLN COMPARISON:  CT 12/10/2022. Right upper quadrant ultrasound earlier today. FINDINGS: Lower chest: Areas of atelectasis in the left greater than right lower lobe. No pleural fluid. There are coronary artery calcifications. Hepatobiliary: No focal liver abnormality. Gallstones on ultrasound earlier today are not well-defined by CT. There is no biliary dilatation. Pancreas: There is minimal edema adjacent to the head and uncinate process of the pancreas, series 2, image 37. No pancreatic ductal dilatation or evidence of pancreatic mass. Spleen: Normal in size without focal abnormality. Adrenals/Urinary Tract: Normal adrenal glands. No hydronephrosis or renal inflammation. Punctate nonobstructing stone in the lower left kidney. Left renal cyst. No further follow-up imaging is recommended. Unremarkable urinary bladder. Stomach/Bowel: Unremarkable appearance of the stomach. No bowel dilatation or evidence of  obstruction. The appendix is not confidently visualized. Moderate volume of stool in the colon. Left colonic diverticulosis. No diverticulitis. Sigmoid colon is redundant. Vascular/Lymphatic: Dense aortic atherosclerosis. Moderate branch atherosclerosis. No aneurysm. Patent portal vein. No suspicious lymphadenopathy. Reproductive: Prostate is unremarkable. Other: No free air, free fluid, or intra-abdominal fluid collection. Small fat containing umbilical hernia. Musculoskeletal: Scoliosis and degenerative change throughout the spine. There are no acute or suspicious osseous abnormalities. IMPRESSION: 1. Minimal edema adjacent to the head and uncinate process of the pancreas, suspicious for acute pancreatitis. Recommend correlation with pancreatic enzymes. 2. Gallstones on ultrasound earlier today are not well-defined by CT. No biliary dilatation. 3. Punctate nonobstructing left renal stone. 4. Colonic diverticulosis without diverticulitis. Aortic Atherosclerosis (ICD10-I70.0). Electronically Signed   By: Andrea Gasman M.D.   On: 11/02/2023 18:51   US  ABDOMEN LIMITED RUQ (LIVER/GB) Result Date: 11/02/2023 CLINICAL DATA:  Right upper quadrant pain for 1 day. EXAM: ULTRASOUND ABDOMEN LIMITED RIGHT UPPER QUADRANT COMPARISON:  12/10/2022 FINDINGS: Gallbladder: Gallstones identified measuring up to 6 mm. No gallbladder wall thickening, pericholecystic fluid or sonographic Murphy's sign. Common bile duct: Diameter: 4.2 mm.  No intrahepatic bile duct dilatation. Liver: No focal lesion identified. Within normal limits in parenchymal echogenicity. Portal vein is patent on color Doppler imaging with normal direction of blood flow towards the liver. Other: None. IMPRESSION: Cholelithiasis without secondary signs of acute  cholecystitis. Electronically Signed   By: Waddell Calk M.D.   On: 11/02/2023 08:33   DG Chest Portable 1 View Result Date: 11/02/2023 CLINICAL DATA:  Chest pain. EXAM: PORTABLE CHEST 1 VIEW  COMPARISON:  12/10/2022 FINDINGS: The heart size and mediastinal contours are within normal limits. Aortic atherosclerotic calcifications. Unchanged, chronic bronchial wall thickening. No pleural fluid, interstitial edema or airspace disease. The visualized skeletal structures are unremarkable. IMPRESSION: 1. No acute findings. 2. Chronic bronchial wall thickening. Electronically Signed   By: Waddell Calk M.D.   On: 11/02/2023 07:00      Discharge Exam: Vitals:   11/04/23 0416 11/04/23 1136  BP: 131/67 138/64  Pulse: 66 63  Resp: 18 18  Temp: 97.8 F (36.6 C) 97.8 F (36.6 C)  SpO2: 95% 98%   Vitals:   11/03/23 1544 11/03/23 1947 11/04/23 0416 11/04/23 1136  BP: (!) 138/54 (!) 148/60 131/67 138/64  Pulse: 62 78 66 63  Resp: 16  18 18   Temp: 97.6 F (36.4 C) 97.9 F (36.6 C) 97.8 F (36.6 C) 97.8 F (36.6 C)  TempSrc:      SpO2: 92% 96% 95% 98%  Weight:      Height:        Constitutional:  Normal appearance. Non toxic-appearing.  HENT: Head Normocephalic and atraumatic.  Mucous membranes are moist.  Eyes:  Extraocular intact. Conjunctivae normal.  Cardiovascular: Rate and Rhythm: Normal rate and regular rhythm. Abdomen: Soft, appropriately tender around surgical sites, all surgical sites well-healing, mild ecchymosis, no oozing or bleeding Pulmonary: Non labored, symmetric rise of chest wall.  Skin: warm and dry. not jaundiced.  Neurological: No focal deficit present. alert. Oriented.  Psychiatric: Mood and Affect congruent.    The results of significant diagnostics from this hospitalization (including imaging, microbiology, ancillary and laboratory) are listed below for reference.     Microbiology: No results found for this or any previous visit (from the past 240 hours).   Labs: BNP (last 3 results) No results for input(s): BNP in the last 8760 hours. Basic Metabolic Panel: Recent Labs  Lab 11/02/23 0616 11/02/23 1504 11/03/23 0739  NA 140 138 141  K  3.5 3.4* 4.4  CL 107 104 110  CO2 24 24 23   GLUCOSE 116* 207* 99  BUN 23 21 15   CREATININE 0.96 0.88 0.87  CALCIUM  8.7* 8.8* 8.6*  MG  --   --  2.3   Liver Function Tests: Recent Labs  Lab 11/02/23 0616 11/02/23 1504 11/03/23 0739  AST 84* 332* 379*  ALT 39 198* 334*  ALKPHOS 52 73 92  BILITOT 1.0 2.0* 3.5*  PROT 6.2* 6.7 6.1*  ALBUMIN 3.5 3.8 3.5   Recent Labs  Lab 11/02/23 0616 11/02/23 1504  LIPASE 60* 44   No results for input(s): AMMONIA in the last 168 hours. CBC: Recent Labs  Lab 11/02/23 0616 11/02/23 1504 11/03/23 0739  WBC 4.4 6.0 5.2  NEUTROABS 2.9  --   --   HGB 11.4* 12.5* 11.5*  HCT 34.0* 36.7* 34.3*  MCV 95.2 96.1 96.1  PLT 226 252 221   Cardiac Enzymes: No results for input(s): CKTOTAL, CKMB, CKMBINDEX, TROPONINI in the last 168 hours. BNP: Invalid input(s): POCBNP CBG: No results for input(s): GLUCAP in the last 168 hours. D-Dimer No results for input(s): DDIMER in the last 72 hours. Hgb A1c No results for input(s): HGBA1C in the last 72 hours. Lipid Profile No results for input(s): CHOL, HDL, LDLCALC, TRIG, CHOLHDL, LDLDIRECT in the  last 72 hours. Thyroid  function studies No results for input(s): TSH, T4TOTAL, T3FREE, THYROIDAB in the last 72 hours.  Invalid input(s): FREET3 Anemia work up No results for input(s): VITAMINB12, FOLATE, FERRITIN, TIBC, IRON, RETICCTPCT in the last 72 hours. Urinalysis    Component Value Date/Time   COLORURINE STRAW (A) 03/29/2022 0734   APPEARANCEUR CLEAR (A) 03/29/2022 0734   LABSPEC 1.012 03/29/2022 0734   PHURINE 6.0 03/29/2022 0734   GLUCOSEU NEGATIVE 03/29/2022 0734   HGBUR NEGATIVE 03/29/2022 0734   BILIRUBINUR NEGATIVE 03/29/2022 0734   KETONESUR NEGATIVE 03/29/2022 0734   PROTEINUR NEGATIVE 03/29/2022 0734   NITRITE NEGATIVE 03/29/2022 0734   LEUKOCYTESUR NEGATIVE 03/29/2022 0734   Sepsis Labs Recent Labs  Lab 11/02/23 0616  11/02/23 1504 11/03/23 0739  WBC 4.4 6.0 5.2   Microbiology No results found for this or any previous visit (from the past 240 hours).   Time coordinating discharge: 32 min   SIGNED: Lorane Poland, MD  [facility] 11/04/2023, 3:11 PM Pager   If 7PM-7AM, please contact night-coverage

## 2023-11-05 ENCOUNTER — Telehealth: Payer: Self-pay

## 2023-11-05 NOTE — Transitions of Care (Post Inpatient/ED Visit) (Signed)
   11/05/2023  Name: [name] MRN: [id number] DOB: [birth year]  Today's TOC FU Call Status: Today's TOC FU Call Status:: Unsuccessful Call (1st Attempt) Unsuccessful Call (1st Attempt) Date: 11/05/23  Attempted to reach the patient regarding the most recent Inpatient/ED visit.  Follow Up Plan: Additional outreach attempts will be made to reach the patient to complete the Transitions of Care (Post Inpatient/ED visit) call.   Signature  Charmaine Bloodgood, LPN [facility] Health Advisor [facility] You Are. We Are. [facility] Direct Dial [phone]

## 2023-11-05 NOTE — Transitions of Care (Post Inpatient/ED Visit) (Signed)
 11/05/2023  Name: [name] MRN: [id number] DOB: [birth year]  Today's TOC FU Call Status: Today's TOC FU Call Status:: Successful TOC FU Call Completed Unsuccessful Call (1st Attempt) Date: 11/05/23 [facility] FU Call Complete Date: 11/05/23 Patient's Name and Date of Birth confirmed.  Transition Care Management Follow-up Telephone Call Date of Discharge: 11/04/23 Discharge Facility: [facility]) Type of Discharge: Inpatient Admission Primary Inpatient Discharge Diagnosis:: Symptomatic cholelithiasis How have you been since you were released from the hospital?: Better Any questions or concerns?: No  Items Reviewed: Did you receive and understand the discharge instructions provided?: Yes Medications obtained,verified, and reconciled?: Yes (Medications Reviewed) Any new allergies since your discharge?: No Dietary orders reviewed?: Yes Type of Diet Ordered:: normal Do you have support at home?: Yes People in Home [RPT]: spouse  Medications Reviewed Today: Medications Reviewed Today     Reviewed by Lavelle Charmaine NOVAK, LPN (Licensed Practical Nurse) on 11/05/23 at 0911  Med List Status: <None>   Medication Order Taking? Sig Documenting Provider Last Dose Status Informant  amLODipine  (NORVASC ) 10 MG tablet [id number] Yes Take 1 tablet (10 mg total) by mouth daily. Rilla Baller, MD  Active Spouse/Significant Other  aspirin  EC 81 MG tablet [id number] Yes Take 1 tablet (81 mg total) by mouth daily. Swallow whole. Caleen Qualia, MD  Active Spouse/Significant Other  azelastine  (ASTELIN ) 0.1 % nasal spray [id number] Yes Place 1 spray into both nostrils 2 (two) times daily. Use in each nostril as directed Rilla Baller, MD  Active Spouse/Significant Other           Med Note BEVERLEE, MERLYNN   Fri Nov 02, 2023 11:09 PM) prn  cetirizine (ZYRTEC) 10 MG tablet [id number] Yes Take 10 mg by mouth daily. Alternates weekly with allegra. OTC [provider]   Active Spouse/Significant Other  clopidogrel  (PLAVIX ) 75 MG tablet [id number] Yes Take 1 tablet (75 mg total) by mouth daily. Rilla Baller, MD  Active Spouse/Significant Other  Coenzyme Q10-Vitamin E (QUNOL ULTRA COQ10 PO) [id number] Yes Take 1 capsule by mouth daily. OTC [provider]  Active Spouse/Significant Other  cyanocobalamin  (VITAMIN B12) 1000 MCG tablet [id number] Yes Take 1 tablet (1,000 mcg total) by mouth daily.  Patient taking differently: Take 1,000 mcg by mouth daily. OTC   Rilla Baller, MD  Active Spouse/Significant Other  donepezil  (ARICEPT ) 5 MG tablet [id number] Yes Take 1 tablet (5 mg total) by mouth at bedtime. Rilla Baller, MD  Active Spouse/Significant Other  hydrALAZINE  (APRESOLINE ) 50 MG tablet [id number] Yes Take 1 tablet (50 mg total) by mouth 3 (three) times daily. Rilla Baller, MD  Active Spouse/Significant Other  HYDROcodone -acetaminophen  (NORCO/VICODIN) 5-325 MG tablet [id number] Yes Take 1 tablet by mouth every 6 (six) hours as needed for up to 3 days. Rodolph Romano, MD  Active   losartan  (COZAAR ) 50 MG tablet [id number] Yes Take 1 tablet (50 mg total) by mouth daily. Rilla Baller, MD  Active Spouse/Significant Other  Misc Natural Products (OSTEO BI-FLEX TRIPLE STRENGTH) TABS [id number] Yes Take 1 tablet by mouth daily. + vitamin D . OTC [provider]  Active Spouse/Significant Other  Multiple Vitamins-Minerals (ONE-A-DAY MENS 50+) TABS [id number] Yes Take 1 tablet by mouth daily. OTC [provider]  Active Spouse/Significant Other  pantoprazole  (PROTONIX ) 20 MG tablet [id number] Yes Take 1 tablet (20 mg total) by mouth daily. Rilla Baller, MD  Active Spouse/Significant Other  polyethylene glycol (MIRALAX  / GLYCOLAX ) packet [id number] Yes Take 8.5 g by mouth daily.  [provider]  Active Spouse/Significant Other  pravastatin  (PRAVACHOL ) 20 MG tablet [id number] Yes Take 1 tablet (20 mg total) by mouth 2  (two) times a week. Rilla Baller, MD  Active Spouse/Significant Other  sertraline  (ZOLOFT ) 25 MG tablet [id number] Yes Take 1 tablet (25 mg total) by mouth daily. Rilla Baller, MD  Active Spouse/Significant Other  triamcinolone  cream (KENALOG ) 0.1 % [id number] Yes Apply to affected areas rash on right hip twice daily until improved. Avoid applying to face, groin, and axilla. Use as directed. Long-term use can cause thinning of the skin. Hester Alm BROCKS, MD  Active Spouse/Significant Other           Med Note CATHY OVAL DEL   Fri Sep 21, 2023  1:27 PM) prn            Home Care and Equipment/Supplies: Were Home Health Services Ordered?: NA Any new equipment or medical supplies ordered?: NA  Functional Questionnaire: Do you need assistance with bathing/showering or dressing?: No Do you need assistance with meal preparation?: Yes Do you need assistance with eating?: No Do you have difficulty maintaining continence: No Do you need assistance with getting out of bed/getting out of a chair/moving?: No Do you have difficulty managing or taking your medications?: No  Follow up appointments reviewed: PCP Follow-up appointment confirmed?: Yes Date of PCP follow-up appointment?: 11/14/23 Follow-up Provider: [facility] Follow-up appointment confirmed?: Yes Follow-Up Specialty Provider:: Surgeon Do you need transportation to your follow-up appointment?: No Do you understand care options if your condition(s) worsen?: Yes-patient verbalized understanding    SIGNATURE Charmaine Bloodgood, LPN [facility] Advisor [facility] You Are. We Are. [facility] Direct Dial [phone]

## 2023-11-06 LAB — SURGICAL PATHOLOGY

## 2023-11-14 ENCOUNTER — Encounter: Payer: Self-pay | Admitting: Family Medicine

## 2023-11-14 ENCOUNTER — Ambulatory Visit: Admitting: Family Medicine

## 2023-11-14 VITALS — BP 156/56 | HR 57 | Temp 97.7°F | Ht 69.0 in | Wt 199.4 lb

## 2023-11-14 DIAGNOSIS — Z8673 Personal history of transient ischemic attack (TIA), and cerebral infarction without residual deficits: Secondary | ICD-10-CM

## 2023-11-14 DIAGNOSIS — I10 Essential (primary) hypertension: Secondary | ICD-10-CM | POA: Diagnosis not present

## 2023-11-14 DIAGNOSIS — K8020 Calculus of gallbladder without cholecystitis without obstruction: Secondary | ICD-10-CM

## 2023-11-14 DIAGNOSIS — R634 Abnormal weight loss: Secondary | ICD-10-CM | POA: Insufficient documentation

## 2023-11-14 DIAGNOSIS — G3184 Mild cognitive impairment, so stated: Secondary | ICD-10-CM | POA: Diagnosis not present

## 2023-11-14 DIAGNOSIS — K851 Biliary acute pancreatitis without necrosis or infection: Secondary | ICD-10-CM | POA: Diagnosis not present

## 2023-11-14 DIAGNOSIS — K5909 Other constipation: Secondary | ICD-10-CM

## 2023-11-14 DIAGNOSIS — S98131A Complete traumatic amputation of one right lesser toe, initial encounter: Secondary | ICD-10-CM

## 2023-11-14 DIAGNOSIS — Z89421 Acquired absence of other right toe(s): Secondary | ICD-10-CM

## 2023-11-14 LAB — CBC WITH DIFFERENTIAL/PLATELET
Basophils Absolute: 0 K/uL (ref 0.0–0.1)
Basophils Relative: 0.8 % (ref 0.0–3.0)
Eosinophils Absolute: 0.1 K/uL (ref 0.0–0.7)
Eosinophils Relative: 2.4 % (ref 0.0–5.0)
HCT: 36.7 % — ABNORMAL LOW (ref 39.0–52.0)
Hemoglobin: 12.3 g/dL — ABNORMAL LOW (ref 13.0–17.0)
Lymphocytes Relative: 21.9 % (ref 12.0–46.0)
Lymphs Abs: 1.1 K/uL (ref 0.7–4.0)
MCHC: 33.6 g/dL (ref 30.0–36.0)
MCV: 94.8 fl (ref 78.0–100.0)
Monocytes Absolute: 0.5 K/uL (ref 0.1–1.0)
Monocytes Relative: 10 % (ref 3.0–12.0)
Neutro Abs: 3.4 K/uL (ref 1.4–7.7)
Neutrophils Relative %: 64.9 % (ref 43.0–77.0)
Platelets: 308 K/uL (ref 150.0–400.0)
RBC: 3.87 Mil/uL — ABNORMAL LOW (ref 4.22–5.81)
RDW: 15.2 % (ref 11.5–15.5)
WBC: 5.2 K/uL (ref 4.0–10.5)

## 2023-11-14 LAB — COMPREHENSIVE METABOLIC PANEL WITH GFR
ALT: 26 U/L (ref 0–53)
AST: 21 U/L (ref 0–37)
Albumin: 4.3 g/dL (ref 3.5–5.2)
Alkaline Phosphatase: 89 U/L (ref 39–117)
BUN: 21 mg/dL (ref 6–23)
CO2: 30 meq/L (ref 19–32)
Calcium: 8.9 mg/dL (ref 8.4–10.5)
Chloride: 101 meq/L (ref 96–112)
Creatinine, Ser: 1.12 mg/dL (ref 0.40–1.50)
GFR: 58.09 mL/min — ABNORMAL LOW (ref >60.00)
Glucose, Bld: 87 mg/dL (ref 70–99)
Potassium: 4.6 meq/L (ref 3.5–5.1)
Sodium: 137 meq/L (ref 135–145)
Total Bilirubin: 0.6 mg/dL (ref 0.2–1.2)
Total Protein: 6.9 g/dL (ref 6.0–8.3)

## 2023-11-14 LAB — POC URINALSYSI DIPSTICK (AUTOMATED)
Bilirubin, UA: NEGATIVE
Blood, UA: NEGATIVE
Glucose, UA: NEGATIVE
Ketones, UA: NEGATIVE
Leukocytes, UA: NEGATIVE
Nitrite, UA: NEGATIVE
Protein, UA: POSITIVE — AB
Spec Grav, UA: 1.02 (ref 1.010–1.025)
Urobilinogen, UA: 0.2 U/dL
pH, UA: 6 (ref 5.0–8.0)

## 2023-11-14 LAB — LIPASE: Lipase: 116 U/L — ABNORMAL HIGH (ref 11.0–59.0)

## 2023-11-14 NOTE — Progress Notes (Unsigned)
 Ph: [phone] Fax: [phone]   Patient ID: [name], male    DOB: 11/26/1931, 89 y.o.   MRN: [id number]  This visit was conducted in person.  BP (!) 160/60   Pulse (!) 57   Temp 97.7 F (36.5 C) (Oral)   Ht 5' 9 (1.753 m)   Wt 199 lb 6.4 oz (90.4 kg)   SpO2 99%   BMI 29.45 kg/m   BP Readings from Last 3 Encounters:  11/14/23 (!) 160/60  11/04/23 138/64  11/02/23 (!) 158/65    CC: hosp f/u visit  Subjective:   HPI: [name] is a 89 y.o. male presenting on 11/14/2023 for Hospitalization Follow-up (Addmitted on 11/02/23 at [facility], DX elevate LFT's ; Gallstones. Pt accompanied by wife, [name].)   Recent hospitalization for acute onset epigastric pain with nausea/vomiting, found to have acute gallstone pancreatitis on imaging (MRCP - cholelithiasis without cholecystitis) s/p robotic assisted laparoscopic cholecystectomy. Chest pain deemed due to pancreatitis.  Hospital records reviewed. Med rec performed.   Overall feeling better. 13 lb weight loss noted over the past 6 months.   Continues miralax  1/2 capful every other day. Otherwise gets constipated. Good fiber and water.  Notes some urinary urgency with incontinence, some urinary urgency.   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 See derm twice yearly.  No dysphagia, hoarseness, sore throat.  Lab Results  Component Value Date   TSH 1.89 09/05/2022    Home health not set up.  Other follow up appointments scheduled: gen surgery 11/15/2023 Daphane Coe), neurology 11/26/2023 ______________________________________________________________________ Hospital admission: 11/02/2023 Hospital discharge: 11/04/2023 TCM f/u phone call:  performed on 11/05/2023  Recommendations for Outpatient Follow-up:  Follow up with PCP in 1 week to repeat CMP and ensure LFTs are downtrending to normal Follow-up with general surgery in 2 weeks for postop  check     Relevant past medical, surgical, family and social history reviewed and updated as indicated. Interim medical history since our last visit reviewed. Allergies and medications reviewed and updated. Outpatient Medications Prior to Visit  Medication Sig Dispense Refill   amLODipine  (NORVASC ) 10 MG tablet Take 1 tablet (10 mg total) by mouth daily. 90 tablet 4   aspirin  EC 81 MG tablet Take 1 tablet (81 mg total) by mouth daily. Swallow whole. 30 tablet 12   azelastine  (ASTELIN ) 0.1 % nasal spray Place 1 spray into both nostrils 2 (two) times daily. Use in each nostril as directed 30 mL 12   cetirizine  (ZYRTEC ) 10 MG tablet Take 10 mg by mouth daily. Alternates weekly with allegra. OTC     clopidogrel  (PLAVIX ) 75 MG tablet Take 1 tablet (75 mg total) by mouth daily. 90 tablet 4   Coenzyme Q10-Vitamin E  (QUNOL ULTRA COQ10 PO) Take 1 capsule by mouth daily. OTC     cyanocobalamin  (VITAMIN B12) 1000 MCG tablet Take 1 tablet (1,000 mcg total) by mouth daily. (Patient taking differently: Take 1,000 mcg by mouth daily. OTC)     donepezil  (ARICEPT ) 5 MG tablet Take 1 tablet (5 mg total) by mouth at bedtime. 90 tablet 4   hydrALAZINE  (APRESOLINE ) 50 MG tablet Take 1 tablet (50 mg total) by mouth 3 (three) times daily. 270 tablet 4   losartan  (COZAAR ) 50 MG tablet Take 1 tablet (50 mg total) by mouth daily. 90 tablet 4   Misc Natural Products (OSTEO BI-FLEX TRIPLE STRENGTH) TABS Take 1 tablet by mouth daily. +  vitamin D . OTC     Multiple Vitamins-Minerals (ONE-A-DAY MENS 50+) TABS Take 1 tablet by mouth daily. OTC     pantoprazole  (PROTONIX ) 20 MG tablet Take 1 tablet (20 mg total) by mouth daily. 90 tablet 4   polyethylene glycol (MIRALAX  / GLYCOLAX ) packet Take 8.5 g by mouth daily.      pravastatin  (PRAVACHOL ) 20 MG tablet Take 1 tablet (20 mg total) by mouth 2 (two) times a week. 24 tablet 4   sertraline  (ZOLOFT ) 25 MG tablet Take 1 tablet (25 mg total) by mouth daily. 30 tablet 3    triamcinolone  cream (KENALOG ) 0.1 % Apply to affected areas rash on right hip twice daily until improved. Avoid applying to face, groin, and axilla. Use as directed. Long-term use can cause thinning of the skin. 80 g 0   No facility-administered medications prior to visit.     Per HPI unless specifically indicated in ROS section below Review of Systems  Objective:  BP (!) 160/60   Pulse (!) 57   Temp 97.7 F (36.5 C) (Oral)   Ht 5' 9 (1.753 m)   Wt 199 lb 6.4 oz (90.4 kg)   SpO2 99%   BMI 29.45 kg/m   Wt Readings from Last 3 Encounters:  11/14/23 199 lb 6.4 oz (90.4 kg)  11/02/23 205 lb (93 kg)  11/02/23 200 lb (90.7 kg)      Physical Exam    Results for orders placed or performed during the hospital encounter of 11/02/23  Basic metabolic panel   Collection Time: 11/02/23  3:04 PM  Result Value Ref Range   Sodium 138 135 - 145 mmol/L   Potassium 3.4 (L) 3.5 - 5.1 mmol/L   Chloride 104 98 - 111 mmol/L   CO2 24 22 - 32 mmol/L   Glucose, Bld 207 (H) 70 - 99 mg/dL   BUN 21 8 - 23 mg/dL   Creatinine, Ser 9.11 0.61 - 1.24 mg/dL   Calcium  8.8 (L) 8.9 - 10.3 mg/dL   GFR, Estimated >39 >39 mL/min   Anion gap 10 5 - 15  CBC   Collection Time: 11/02/23  3:04 PM  Result Value Ref Range   WBC 6.0 4.0 - 10.5 K/uL   RBC 3.82 (L) 4.22 - 5.81 MIL/uL   Hemoglobin 12.5 (L) 13.0 - 17.0 g/dL   HCT 63.2 (L) 60.9 - 47.9 %   MCV 96.1 80.0 - 100.0 fL   MCH 32.7 26.0 - 34.0 pg   MCHC 34.1 30.0 - 36.0 g/dL   RDW 85.6 88.4 - 84.4 %   Platelets 252 150 - 400 K/uL   nRBC 0.0 0.0 - 0.2 %  Hepatic function panel   Collection Time: 11/02/23  3:04 PM  Result Value Ref Range   Total Protein 6.7 6.5 - 8.1 g/dL   Albumin 3.8 3.5 - 5.0 g/dL   AST 667 (H) 15 - 41 U/L   ALT 198 (H) 0 - 44 U/L   Alkaline Phosphatase 73 38 - 126 U/L   Total Bilirubin 2.0 (H) 0.0 - 1.2 mg/dL   Bilirubin, Direct 1.0 (H) 0.0 - 0.2 mg/dL   Indirect Bilirubin 1.0 (H) 0.3 - 0.9 mg/dL  Lipase, blood   Collection  Time: 11/02/23  3:04 PM  Result Value Ref Range   Lipase 44 11 - 51 U/L  Troponin I (High Sensitivity)   Collection Time: 11/02/23  3:04 PM  Result Value Ref Range   Troponin I (High Sensitivity) 7 <18  ng/L  Troponin I (High Sensitivity)   Collection Time: 11/02/23  4:49 PM  Result Value Ref Range   Troponin I (High Sensitivity) 7 <18 ng/L  Surgical pathology   Collection Time: 11/03/23 12:00 AM  Result Value Ref Range   SURGICAL PATHOLOGY      SURGICAL PATHOLOGY [facility] [address] [place], [place] [address] Telephone [phone] or [phone] Fax [phone]  REPORT OF SURGICAL PATHOLOGY   Accession #: [id number] Patient Name: NOBEL, BRAR Visit # : [id number]  MRN: [id number] Physician: Rodolph Romano DOB/Age 07-Aug-1931 (Age: 18) Gender: M Collected Date: 11/03/2023 Received Date: 11/05/2023  FINAL DIAGNOSIS       1. Gallbladder,  :       - CHRONIC CHOLECYSTITIS WITH CHOLELITHIASIS.      - NEGATIVE FOR MALIGNANCY.       DATE SIGNED OUT: 11/06/2023 ELECTRONIC SIGNATURE : Janel Md, Rexene , Pathologist, Electronic Signature  MICROSCOPIC DESCRIPTION  CASE COMMENTS STAINS USED IN DIAGNOSIS: H&E    CLINICAL HISTORY  SPECIMEN(S) OBTAINED 1. Gallbladder,  SPECIMEN COMMENTS: SPECIMEN CLINICAL INFORMATION: 1. Cholecystitis    Gross Description 1. Size/?Intact: 8.4 x 3.8 cm; predominantly intact with a 1.0 cm transmural, linear def ect (0.7 cm from the cystic duct margin)      Serosal surface: Gray-green, smooth, and glistening with a mild amount of      adipose tissue and a mildly roughened hepatic bed.      Mucosa/Wall: Diffusely bile stained, trabeculated mucosa; 0.1-0.6 cm thick wall      with a 0.6 x 0.3 x 0.2 cm with-walled cyst within the fundus.      Contents: 7.0 x 3.5 x 0.5 cm aggregate of yellow, mulberry stones and stone      fragments (0.1-0.8 cm in greatest dimension); moderate amount of  tenacious, dark      green bile.      Cystic duct: 0.3 cm diameter, patent; received with a single white, plastic      clamp; inked black.      Block Summary: Representative sections including the cystic duct margin (en      face) are submitted in 1 block (1A).      AMG 11/05/2023        Report signed out from the following location(s) [place]. [facility] [address] [place], [place] [address] CLIA #: [id number]  [facility] [address] [place], [place] [address] CLIA #: [id number]   Comprehensive metabolic panel   Collection Time: 11/03/23  7:39 AM  Result Value Ref Range   Sodium 141 135 - 145 mmol/L   Potassium 4.4 3.5 - 5.1 mmol/L   Chloride 110 98 - 111 mmol/L   CO2 23 22 - 32 mmol/L   Glucose, Bld 99 70 - 99 mg/dL   BUN 15 8 - 23 mg/dL   Creatinine, Ser 9.12 0.61 - 1.24 mg/dL   Calcium  8.6 (L) 8.9 - 10.3 mg/dL   Total Protein 6.1 (L) 6.5 - 8.1 g/dL   Albumin 3.5 3.5 - 5.0 g/dL   AST 620 (H) 15 - 41 U/L   ALT 334 (H) 0 - 44 U/L   Alkaline Phosphatase 92 38 - 126 U/L   Total Bilirubin 3.5 (H) 0.0 - 1.2 mg/dL   GFR, Estimated >39 >39 mL/min   Anion gap 8 5 - 15  CBC   Collection Time: 11/03/23  7:39 AM  Result Value Ref Range  WBC 5.2 4.0 - 10.5 K/uL   RBC 3.57 (L) 4.22 - 5.81 MIL/uL   Hemoglobin 11.5 (L) 13.0 - 17.0 g/dL   HCT 65.6 (L) 60.9 - 47.9 %   MCV 96.1 80.0 - 100.0 fL   MCH 32.2 26.0 - 34.0 pg   MCHC 33.5 30.0 - 36.0 g/dL   RDW 85.3 88.4 - 84.4 %   Platelets 221 150 - 400 K/uL   nRBC 0.0 0.0 - 0.2 %  Magnesium    Collection Time: 11/03/23  7:39 AM  Result Value Ref Range   Magnesium  2.3 1.7 - 2.4 mg/dL    Assessment & Plan:   Problem List Items Addressed This Visit   None    No orders of the defined types were placed in this encounter.   No orders of the defined types were placed in this encounter.   There are no Patient Instructions on file for this visit.  Follow up plan: No follow-ups on  file.  Anton Blas, MD

## 2023-11-14 NOTE — Patient Instructions (Addendum)
 Labs and urinalysis today  Good to see you today Keep upcoming neurology appointment

## 2023-11-15 ENCOUNTER — Ambulatory Visit: Payer: Self-pay | Admitting: Family Medicine

## 2023-11-15 ENCOUNTER — Encounter: Payer: Self-pay | Admitting: Family Medicine

## 2023-11-15 NOTE — Assessment & Plan Note (Addendum)
 Chronic, permissive hypertension after TIA 09/2023. Imaging at that time with evidence of chronic cerebellar strokes and significant large vessel ATH/occlusion.  Await neurological consult/recommendations later this month.  No med changes for now.

## 2023-11-15 NOTE — Assessment & Plan Note (Signed)
 ER records reviewed.  S/p lap chole Lipase was resolved at discharge, transaminitis remained.  Update labs including lipase.  Keep gen surg f/u.

## 2023-11-15 NOTE — Assessment & Plan Note (Signed)
 Reviewed bowel regimen - continue miralax .  Consider linzess, amitiza trial.

## 2023-11-15 NOTE — Assessment & Plan Note (Signed)
Continue donepezil and sertraline.

## 2023-11-15 NOTE — Assessment & Plan Note (Signed)
 Noted 13 lb unintentional weight loss over the past 6 months Overall feels well. No red flags.  H/o radiation to throat for non-cancer diagnosis 2009.  Consider update imaging, reassess at f/u next month .

## 2023-11-22 ENCOUNTER — Encounter: Payer: Self-pay | Admitting: *Deleted

## 2023-11-26 ENCOUNTER — Encounter: Payer: Self-pay | Admitting: Neurology

## 2023-11-26 ENCOUNTER — Ambulatory Visit: Payer: Self-pay | Admitting: Neurology

## 2023-11-26 VITALS — BP 158/50 | HR 56

## 2023-11-26 DIAGNOSIS — I639 Cerebral infarction, unspecified: Secondary | ICD-10-CM | POA: Diagnosis not present

## 2023-11-26 DIAGNOSIS — R42 Dizziness and giddiness: Secondary | ICD-10-CM | POA: Diagnosis not present

## 2023-11-26 DIAGNOSIS — R001 Bradycardia, unspecified: Secondary | ICD-10-CM

## 2023-11-26 DIAGNOSIS — G4733 Obstructive sleep apnea (adult) (pediatric): Secondary | ICD-10-CM

## 2023-11-26 DIAGNOSIS — R29818 Other symptoms and signs involving the nervous system: Secondary | ICD-10-CM

## 2023-11-26 NOTE — Patient Instructions (Signed)
 Please continue to use your CPAP consistently as you are.  I am not convinced that you are completely safe to drive, even locally.  This is because of your hearing loss, memory loss, difficulty with coordination, posture changes, limitation in neck movements and history of confusion.  Below are some places that do a formal evaluation of your driving.  I highly recommend that you stop driving altogether or get a formal evaluation done first.  [facility] in [place]: [phone]  2.   [facility]: [phone]  3.   [facility]: [phone]  4.   [facility]: [phone] or [phone]   Please stay well-hydrated and well rested, use a cane for gait safety as you have mild instability with walking.

## 2023-11-26 NOTE — Progress Notes (Signed)
 Subjective:    Patient ID: [name] is a 89 y.o. male.  HPI    True Mar, MD, PhD [facility] [address], [address] [address] [place], [place] [address]   Mr. Downard is an 89 year old male with an underlying medical history of hypertension, hyperlipidemia, sleep apnea, mild cognitive impairment, history of cerebellar stroke, gout, history of laryngeal cancer with status post XRT, carotid artery stenosis, bradycardia, vertigo, hypokalemia, cholecystitis, elevated liver enzymes, status post robotic assisted laparoscopic cholecystectomy on 11/03/2023, who presents for TIA follow-up after recent hospitalization.  The patient is accompanied by his stepson today.  He reports feeling at baseline, he denies any recent recurrence of dizziness or falls.  He tries to walk regularly.  He presented to the emergency room on 09/21/2023 with dizziness and difficulty walking.  I reviewed the hospital records.  He was discharged on 09/22/2023.  Workup included echocardiogram which did not show any left ventricular dysfunction, showed normal EF and grade 1 diastolic dysfunction.  He was advised to add aspirin  to Plavix .  He had a CT angiogram of the head and neck with and without contrast on 09/21/2023 and I reviewed the results:  IMPRESSION: 1. Pre-existing Critical atherosclerotic stenosis of the Vertebrobasilar junction, with New Occlusion of the distal Right Vertebral artery since the 2023 CTA. Chronic occlusion of the proximal Left Vertebral Artery but stable reconstitution. But subsequently decreased Basilar artery enhancement compared to 2023, and evidence of progressed underlying Basilar atherosclerosis, stenosis. Basilar tip chronically supplied from P-comm collaterals, stable.   2. Other fairly severe atherosclerosis in the Head and Neck including: - Severe bilateral ICA siphon stenosis due to bulky calcified plaque, stable to progressed. - RICA origin stenosis  estimated at 62%. - 50% stenosis LICA below the skull base.   3.  Aortic Atherosclerosis (ICD10-I70.0). He had a brain MRI without contrast on 09/21/2023 and I reviewed the results:  IMPRESSION: 1. Negative for acute infarct, despite abnormal distal Vertebral arteries and Basilar as detailed on CTA today separately.   2. Progressed since 2023 but chronic small infarcts in the left > right cerebellum. Brainstem signal remains normal.   3. Stable since 2023 and mild for age supratentorial signal changes compatible with small vessel disease.  Surgical intervention for significant bilateral ICA stenosis was not recommended due to surgical risks.  He was found to have a new occlusion of the distal right vertebral artery since 2023.  He did not have any acute infarct on MRI testing.  Outpatient physical therapy was recommended.  I had evaluated him for sleep apnea and his cerebellar stroke in the past.  He was last seen in our clinic in October 2023. Of note, he had a split-night sleep study through our sleep lab on 10/27/2016 which showed moderate obstructive sleep apnea by number of events.  He has been on PAP therapy.   I reviewed his CPAP compliance data for the past month from 10/27/2023 through 11/25/2023, during which time he used his machine 28 out of 30 days with percent use days greater than 4 hours at 93%, indicating excellent compliance with an average usage of 8 hours and 17 minutes, residual AHI at goal at 2.4/h, leak on the higher side with fluctuation, with a 95th percentile at 24 L/min on a pressure of 10 cm with EPR of 1.  He reports full compliance with his PAP machine and does not sleep without it.  He had to skip some nights when he was hospitalized for  his gallbladder surgery.    He lives with his wife, he has not had any alcohol in about 3 months, he tries to hydrate well, estimates that he drinks about 5 to 6 cups of water per day, drinks 1 cup of decaf coffee in the morning.  He  tries to walk regularly, has gradually increased his walking, did not have any outpatient physical therapy after his admission to the hospital for suspected TIA.  He does not use a walker or cane.  He still drives, he drives locally only.  He is currently on donepezil .  He continues to take baby aspirin  and Plavix  daily.  Previously:  02/07/2022: 89 year old right-handed gentleman with an underlying medical history of sleep apnea, on CPAP therapy, gout, history of laryngeal cancer with status post XRT, carotid artery stenosis, bradycardia, hypertension, hyperlipidemia, reflux disease, prediabetes, and mild obesity, who presented to the emergency room with feeling of dizziness and off-balance.  He reports feeling back to baseline.  She tries to hydrate well.  He does not have a family history of stroke, he was a never smoker.  He does have a history of arthritis.  He has had low heart rate off and on for years.  He was started on donepezil  by primary care about a year ago and it was increased to 10 mg but then reduced back to 5 mg.  He is stable on the dose, he tolerates it. He stays compliant with his CPAP.  I reviewed his most recent compliance data from 01/08/2022 through 02/06/2022, during which time he used his machine 28 out of 30 days, of note, he was also hospitalized in the early part of that timeframe.  Average usage of 8 hours and 28 minutes, residual AHI at goal at 2.3/h, leak acceptable with the 95th percentile at 11.1 L/min on a pressure of 10 cm with EPR of 1.  I believe he is eligible for new machine and was supposed to get a new machine, we will check with his DME company, [employer].   I reviewed the hospital records.  He was admitted to the hospital from 01/08/2022 through 01/10/2022.  His brain MRI without contrast from 01/08/2022 showed: IMPRESSION: 1. 1.7 cm acute to early subacute ischemic nonhemorrhagic central cerebellar infarct. 2. Age-related cerebral atrophy with mild chronic small  vessel ischemic disease.   He had a CT angiogram of the head and neck with and without contrast on 01/09/2022 and I reviewed the results: IMPRESSION: CT HEAD IMPRESSION:   1. Previously identified cerebellar infarct not well visualized by CT. No intracranial hemorrhage. No other acute intracranial abnormality. 2. Underlying atrophy with chronic small vessel ischemic disease.   CTA HEAD AND NECK IMPRESSION:   1. Occlusion of the dominant mid-distal left V1 segment, age indeterminate, but could be acute in nature. Distal reconstitution at the left V2 segment via cervical collaterals. Additional extensive atheromatous change about the intracranial V4 segments with severe multifocal stenoses as above. 2. Severe 85% stenosis at the origin of the right subclavian artery. 3. Short-segment 50% stenosis about the distal cervical left ICA. 4. 70% atheromatous stenosis about the right carotid bulb. 5. Extensive atheromatous disease throughout the carotid siphons with associated moderate multifocal stenoses.   He had consultation with vascular surgery, no surgical intervention was recommended.   He was placed on aspirin  and Plavix  with the plan to continue dual antiplatelet medications for 3 months, then Plavix  alone.  His echocardiogram from 01/09/2022 showed EF of 60 to 65%, normal LV function,  no regional wall motion abnormalities, moderate left ventricular hypertrophy.  There was trivial mitral valve regurgitation, no evidence of mitral stenosis, no aortic stenosis.  There was borderline dilatation of the aortic root.   Blood work from 01/09/2022 showed an A1c of 5.6, lipid panel showed total cholesterol of 136, triglycerides 138, HDL 41, LDL 67, no new cholesterol medication was started for that reason.   His primary care physician has previously followed his carotid arteries with yearly Doppler ultrasounds.   He had a follow-up with his primary care physician recently since his hospital discharge  on 01/17/2022.  I reviewed the office note.  He was advised to continue with amlodipine  and hydralazine  but hold his losartan  and Toprol  for now.  He saw Greig Forbes, NP on 12/01/2021, at which time he was compliant with his CPAP of 10 cm with good apnea control.  He was advised to follow-up in sleep clinic in 1 year.   He saw Greig Forbes, NP on 12/01/2020, at which time he was compliant with his CPAP and doing well.  He was working part-time at [employer].   He saw Greig Forbes, NP on 09/04/2019, at which time he reported doing well with his CPAP therapy.  He was noted to have an elevated blood pressure at the time.   He had a telephone visit with Amy Lomax on 09/02/2018, at which time he reported doing well with his CPAP of 10 cm.   He saw Duwaine Russell, NP on 08/29/2017, at which time he was compliant with his CPAP of 10 cm, apnea control was good but wife had noticed residual snoring.   01/23/17 (SA): 89 year old right-handed gentleman with an underlying medical history of gout, reflux disease, diverticulitis, bilateral carotid artery stenosis, osteoarthritis, hypertension, skin cancer, history of osteomyelitis, hearing loss, seasonal allergies and obesity, who presents for follow-up consultation of his obstructive sleep apnea, after recent sleep study for re-evaluation. The patient is unaccompanied today. I first met him on 10/11/2016 at the request of his primary care physician, at which time he reported a prior diagnosis of obstructive sleep apnea and he had an older CPAP machine. He was advised to return for a repeat sleep study for requalification for sleep apnea treatment and a new CPAP machine. He had a split-night sleep study on 10/27/2016. I went over his test results with him in detail today. His baseline sleep efficiency was only 44.7%, REM sleep was absent during the first part of the study. Total AHI was 25.5 per hour, average oxygen saturation was 94%, nadir was 89%. He had moderate PLMS with moderate  arousals. He was fitted with a nasal mask. CPAP was titrated from 5 cm to 10 cm. On final pressure his AHI was 0 per hour, some supine REM sleep was achieved and O2 nadir was 93%. Average oxygen saturation was 95%, nadir was 89%. He had no significant PLMS during the second part of the study and during the titration portion of the study sleep efficiency was 80.8% REM percentage was 22.5%. Based on his test results I prescribed a new CPAP machine for him.   I reviewed his CPAP compliance data from 12/23/2016 through 01/21/2017 which is a total of 30 days, during which time he used his machine 29 days with percent used days greater than 4 hours at 97%, indicating excellent compliance with an average usage of 9 hours and 35 minutes, residual AHI at goal at 2.2 per hour, leak acceptable with the 95th percentile at 16.1 L/m on  a pressure of 10 cm with EPR. He reports, he occasional wakes up with a dull headache. He limits his water intake after 8 PM. He does like to drink wine, wife gives him a hard time if he drinks more than 3 glasses. He is using a medium Eson nasal mask. Has occasional leg cramps, but tonic water helps, no actual RLS symptoms.      10/11/2016: (He) was previously diagnosed with obstructive sleep apnea and placed on CPAP therapy years ago. Prior sleep study results are not available for my review today. He has an older CPAP machine, maybe 89 years old. A CPAP compliance download is reviewed today, from 09/11/2016 through 10/10/2016, which is a total of 30 days, pressure of 13.8, AHI of 2.1. This past month, leak low with the 95th percentile at 4.5 L/m. I reviewed your office note from 08/31/2016. His Epworth sleepiness score is 7 out of 24, fatigue score is 17 out of 63. He was diagnosed with OSA when he turned 67, but has had Sx of OSA way before then. Per patient, he had an AHI of about 40/hour. He moved from [place] in 12/17, as wife's family is here in [place], [place]. He has 2 step children, no  biological kids. He retired from Control and instrumentation engineer job.  He has one dog. TV tends to stay on at night, but does not bother him. No telltale RLS symptoms, but was told he jerked a lot during sleep study.  His bedtime varies, usually between 8:30 and 11. His wake up time very is, usually between 5:30 AM to 7 AM. He does not have night to night nocturia. He has one sister who had sleep apnea. He has a total of 6 siblings. Lives with his wife. He is a nonsmoker, drinks alcohol daily about 2 glasses per evening, sometimes more on the weekends. He reports that he was a heavy drinker in the past, typically drinking liquor regularly. He does not drink much in the way of caffeine, usually one cup of coffee per day. He is fully compliant with CPAP machine. He would like to be able to get a new and updated machine.       Her Past Medical History Is Significant For: Past Medical History:  Diagnosis Date   Actinic keratosis    AV block, 1st degree 10/05/2021   With bradycardia 09/2021 - drop Toprol  XL to 25mg  daily, increase amlodipine  to 10mg  daily. Donepezil  can also cause AV block.      Carotid stenosis, asymptomatic, bilateral 09/16/2016   Diffuse 1-39% bilaterally, but involving ICA, ECA, CCA - rpt 1 yr (09/2016)   Complication of anesthesia    slow to wake up after a colonoscopy years ago   Diverticulitis    Dyslipidemia 09/04/2017   Atorvastatin  elevated CPK levels.      GERD (gastroesophageal reflux disease)    Gout    History of cerebellar stroke 01/09/2022   Cerebellar stroke     History of osteomyelitis 10/2015   left foot   History of squamous cell carcinoma of skin    HTN (hypertension)    Infection of left great toe due to methicillin resistant Staphylococcus aureus (MRSA) 2015   Hx s/p IV abx 7 wks through PICC (~2015)     MCI (mild cognitive impairment) 02/17/2020   MMSE (02/2020) 24/30, 25 with cue. Misses 3 orientation, 2 recall (1 with cue), 1 language. Independent ADLs and IADLs. 4/4  clock drawing test.   MMSE 08/2021 23/30, CDT  4/4     OSA on CPAP 09/01/2016   Osteoarthritis    back, neck, knees - multiple joints   Personal history of malignant neoplasm of larynx 2009   s/p XRT, Skin Cancer- pre cancerous   Pre-diabetes    Prediabetes 01/09/2022   Seasonal allergies    Sensorineural hearing loss (SNHL) of both ears 09/01/2016   Sinus bradycardia 02/07/2022   Sinus bradycardia     Squamous cell carcinoma of skin unknown   Treated in [place] - SCC of the L post auricular    Squamous cell carcinoma of skin 10/26/2022   R med knee, EDC    His Past Surgical History Is Significant For: Past Surgical History:  Procedure Laterality Date   ACHILLES TENDON REPAIR Left    AMPUTATION Left 05/16/2019   LEFT GREAT TOE AMPUTATION for osteomyelitis Claud, Jerona GAILS, MD), 2nd toe removed   APPENDECTOMY     CHOLECYSTECTOMY     [facility] physician   COLONOSCOPY     THROAT SURGERY N/A 2009   ?vocal cord vs larynx nodule removal, had XRT after this but states it was not cancer   TONSILLECTOMY     TREATMENT FISTULA ANAL      His Family History Is Significant For: Family History  Problem Relation Age of Onset   Hypertension Mother    Hypertension Father    Cancer Other        unknown - niece   CAD Sister 69       stent (80% blockage)   CAD Brother 92       stent (100% blockage)    His Social History Is Significant For: Social History   Socioeconomic History   Marital status: Married    Spouse name: Not on file   Number of children: Not on file   Years of education: Not on file   Highest education level: Bachelor's degree (e.g., BA, AB, BS)  Occupational History   Not on file  Tobacco Use   Smoking status: Never   Smokeless tobacco: Never  Vaping Use   Vaping status: Never Used  Substance and Sexual Activity   Alcohol use: Yes    Comment: may have something occasionally   Drug use: No   Sexual activity: Not Currently  Other Topics Concern   Not on  file  Social History Narrative   [name]   Widower. 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   Overall Financial Resource Strain (CARDIA)    Difficulty of Paying Living Expenses: Not very hard  Food Insecurity: No Food Insecurity (11/15/2023)   Received from [facility]   Hunger Vital Sign    Within the past 12 months, you worried that your food would run out before you got the money to buy more.: Never true    Within the past 12 months, the food you bought just didn't last and you didn't have money to get more.: Never true  Transportation Needs: No Transportation Needs (11/15/2023)   Received from [facility] - Transportation    In the past 12 months, has lack of  transportation kept you from medical appointments or from getting medications?: No    Lack of Transportation (Non-Medical): No  Physical Activity: Sufficiently Active (03/05/2023)   Exercise Vital Sign    Days of Exercise per Week: 5 days    Minutes of Exercise per Session: 30 min  Stress: No Stress Concern Present (03/05/2023)   [name] of Occupational Health - Occupational Stress Questionnaire    Feeling of Stress : Not at all  Social Connections: Moderately Integrated (11/02/2023)   Social Connection and Isolation Panel    Frequency of Communication with Friends and Family: Three times a week    Frequency of Social Gatherings with Friends and Family: Three times a week    Attends Religious Services: More than 4 times per year    Active Member of Clubs or Organizations: No    Attends Banker Meetings: Never    Marital Status: Married  Recent  Concern: Social Connections - Moderately Isolated (09/21/2023)   Social Connection and Isolation Panel    Frequency of Communication with Friends and Family: Once a week    Frequency of Social Gatherings with Friends and Family: Once a week    Attends Religious Services: More than 4 times per year    Active Member of [employer] or Organizations: No    Attends Banker Meetings: Never    Marital Status: Married    His Allergies Are:  Allergies  Allergen Reactions   Lisinopril Cough   Atorvastatin  Other (See Comments)    Myalgias and mildly elevated CPK   :   His Current Medications Are:  Outpatient Encounter Medications as of 11/26/2023  Medication Sig   amLODipine  (NORVASC ) 10 MG tablet Take 1 tablet (10 mg total) by mouth daily.   aspirin  EC 81 MG tablet Take 1 tablet (81 mg total) by mouth daily. Swallow whole.   azelastine  (ASTELIN ) 0.1 % nasal spray Place 1 spray into both nostrils 2 (two) times daily. Use in each nostril as directed   cetirizine  (ZYRTEC ) 10 MG tablet Take 10 mg by mouth daily. Alternates weekly with allegra. OTC   clopidogrel  (PLAVIX ) 75 MG tablet Take 1 tablet (75 mg total) by mouth daily.   Coenzyme Q10-Vitamin E  (QUNOL ULTRA COQ10 PO) Take 1 capsule by mouth daily. OTC   cyanocobalamin  (VITAMIN B12) 1000 MCG tablet Take 1 tablet (1,000 mcg total) by mouth daily. (Patient taking differently: Take 1,000 mcg by mouth daily. OTC)   donepezil  (ARICEPT ) 5 MG tablet Take 1 tablet (5 mg total) by mouth at bedtime.   hydrALAZINE  (APRESOLINE ) 50 MG tablet Take 1 tablet (50 mg total) by mouth in the morning and at bedtime.   losartan  (COZAAR ) 50 MG tablet Take 1 tablet (50 mg total) by mouth daily.   Misc Natural Products (OSTEO BI-FLEX TRIPLE STRENGTH) TABS Take 1 tablet by mouth daily. + vitamin D . OTC   Multiple Vitamins-Minerals (ONE-A-DAY MENS 50+) TABS Take 1 tablet by mouth daily. OTC   pantoprazole  (PROTONIX ) 20 MG tablet Take 1 tablet (20 mg total) by  mouth daily.   polyethylene glycol (MIRALAX  / GLYCOLAX ) packet Take 8.5 g by mouth daily.    pravastatin  (PRAVACHOL ) 20 MG tablet Take 1 tablet (20 mg total) by mouth 2 (two) times a week.   sertraline  (ZOLOFT ) 25 MG tablet Take 1 tablet (25 mg total) by mouth daily.   triamcinolone  cream (KENALOG ) 0.1 % Apply to affected areas rash on right hip twice daily until improved. Avoid applying to  face, groin, and axilla. Use as directed. Long-term use can cause thinning of the skin.   No facility-administered encounter medications on file as of 11/26/2023.  :   Review of Systems:  Out of a complete 14 point review of systems, all are reviewed and negative with the exception of these symptoms as listed below:   Review of Systems  Neurological:        Patient is here with stepson for TIA about 2 months or so ago. He is also overdue for OSA follow-up. He states he uses his cpap machine religiously. He states he cannot sleep without it. He uses [employer] and he states they still give him supplies. Regarding his TIA, he states he got up and felt dizzy. His son states he got up, went for his daily walk. The patient states he walked 5-6 blocks then lost his breath, felt dizzy, was afraid he was going to fall. He is doing  better now. Since the TIA, he had to have his gallbladder removed. He is back walking up to about 4 blocks. He also started Sertraline  on 09/28/23. ESS 7 FSS 15    Objective:  Neurological Exam  Physical Exam Physical Examination:   Vitals:   11/26/23 1103  BP: (!) 158/50  Pulse: (!) 56  SpO2: 96%    General Examination: The patient is a very pleasant 89 y.o. male in no acute distress. He appears well-developed and well-nourished and well groomed.   HEENT: Normocephalic, atraumatic, pupils are equal, round and reactive to light, tracking mildly impaired, neck with mild decrease in active range of motion with neck turns to left and right, more restricted to the right.  No carotid  bruits.  Tongue protrudes centrally.  Bilateral hearing aids in place.   Chest: Clear to auscultation without wheezing, rhonchi or crackles noted.   Heart: S1+S2+0, regular with mild bradycardia noted.    Abdomen: Soft, non-tender and non-distended.   Extremities: There is trace edema in the distal lower extremities bilaterally.   Skin: Warm and dry with multiple bruising noted in the legs and arms. Age related changes and changes in keeping with chronic sun exposure.   Musculoskeletal: exam reveals no obvious joint deformities, mild swelling left knee, reports having had arthroscopic knee surgery on the left.     Neurologically:  Mental status: The patient is awake, alert and able to provide some of his history, history is supplemented by his stepson.   Cranial nerves II - XII are as described above under HEENT exam.  Motor exam: Normal bulk, global strength of at least 4 out of 5, probably normal for age strength.  No obvious tremor.  Fine motor skills mildly impaired globally.  Reflexes diminished throughout.   Cerebellar testing: No dysmetria or intention tremor. Sensory exam: intact to light touch in the upper and lower extremities.  Gait, station and balance: He without major difficulty, posture is mildly stooped, could be normal for age.  He walks without a walking aid but does have some slowness and wider base gait, slight insecurity with turns.     Assessment and Plan:    In summary, Arion Shankles is an 89 year old male with an underlying medical history of hypertension, hyperlipidemia, sleep apnea, mild cognitive impairment, history of cerebellar stroke, gout, history of laryngeal cancer with status post XRT, carotid artery stenosis, bradycardia, vertigo, hypokalemia, cholecystitis, elevated liver enzymes, status post robotic assisted laparoscopic cholecystectomy on 11/03/2023, who presents for evaluation after hospitalization for transient neurological symptoms and for  his history  of sleep apnea.  He presents after over 1-1/2 years.  He is compliant with his PAP machine.  He is advised to continue with treatment.  He had workup in the hospital for TIA concern and was not found to have any new CVA.  He is advised to continue with secondary prevention and we talked about fall prevention as well today.  In addition, we talked about his driving, I am not convinced that he is completely safe to drive.  He is advised to get a formal evaluation for his driving.  He is advised to stay well rested and well-hydrated and continue to stay active.  I do recommend that he start using a cane for gait safety.  He is advised to follow-up routinely in this clinic to see one of our nurse practitioners for his sleep apnea follow-up in 1 year, sooner if needed.  I answered all their questions today and the patient and his stepson were in agreement.  I spent 45 minutes in total face-to-face time and in reviewing records during pre-charting, more than 50% of which was spent in counseling and coordination of care, reviewing test results, reviewing medications and treatment regimen and/or in discussing or reviewing the diagnosis of TIA concern, sleep apnea, dizziness, the prognosis and treatment options. Pertinent laboratory and imaging test results that were available during this visit with the patient were reviewed by me and considered in my medical decision making (see chart for details).

## 2023-12-05 ENCOUNTER — Ambulatory Visit: Admitting: Dermatology

## 2023-12-05 DIAGNOSIS — W908XXA Exposure to other nonionizing radiation, initial encounter: Secondary | ICD-10-CM

## 2023-12-05 DIAGNOSIS — D485 Neoplasm of uncertain behavior of skin: Secondary | ICD-10-CM | POA: Diagnosis not present

## 2023-12-05 DIAGNOSIS — C4491 Basal cell carcinoma of skin, unspecified: Secondary | ICD-10-CM

## 2023-12-05 DIAGNOSIS — L578 Other skin changes due to chronic exposure to nonionizing radiation: Secondary | ICD-10-CM

## 2023-12-05 DIAGNOSIS — L570 Actinic keratosis: Secondary | ICD-10-CM | POA: Diagnosis not present

## 2023-12-05 DIAGNOSIS — C44311 Basal cell carcinoma of skin of nose: Secondary | ICD-10-CM | POA: Diagnosis not present

## 2023-12-05 DIAGNOSIS — D489 Neoplasm of uncertain behavior, unspecified: Secondary | ICD-10-CM

## 2023-12-05 HISTORY — DX: Basal cell carcinoma of skin, unspecified: C44.91

## 2023-12-05 NOTE — Patient Instructions (Addendum)
 Biopsy Wound Care Instructions  Leave the original bandage on for 24 hours if possible.  If the bandage becomes soaked or soiled before that time, it is OK to remove it and examine the wound.  A small amount of post-operative bleeding is normal.  If excessive bleeding occurs, remove the bandage, place gauze over the site and apply continuous pressure (no peeking) over the area for 30 minutes. If this does not work, please call our clinic as soon as possible or page your doctor if it is after hours.   Once a day, cleanse the wound with soap and water. It is fine to shower. If a thick crust develops you may use a Q-tip dipped into dilute hydrogen peroxide (mix 1:1 with water) to dissolve it.  Hydrogen peroxide can slow the healing process, so use it only as needed.    After washing, apply petroleum jelly (Vaseline) or an antibiotic ointment if your doctor prescribed one for you, followed by a bandage.    For best healing, the wound should be covered with a layer of ointment at all times. If you are not able to keep the area covered with a bandage to hold the ointment in place, this may mean re-applying the ointment several times a day.  Continue this wound care until the wound has healed and is no longer open.   Itching and mild discomfort is normal during the healing process. However, if you develop pain or severe itching, please call our office.   If you have any discomfort, you can take Tylenol (acetaminophen) or ibuprofen as directed on the bottle. (Please do not take these if you have an allergy to them or cannot take them for another reason).  Some redness, tenderness and white or yellow material in the wound is normal healing.  If the area becomes very sore and red, or develops a thick yellow-green material (pus), it may be infected; please notify us.    If you have stitches, return to clinic as directed to have the stitches removed. You will continue wound care for 2-3 days after the stitches  are removed.   Wound healing continues for up to one year following surgery. It is not unusual to experience pain in the scar from time to time during the interval.  If the pain becomes severe or the scar thickens, you should notify the office.    A slight amount of redness in a scar is expected for the first six months.  After six months, the redness will fade and the scar will soften and fade.  The color difference becomes less noticeable with time.  If there are any problems, return for a post-op surgery check at your earliest convenience.  To improve the appearance of the scar, you can use silicone scar gel, cream, or sheets (such as Mederma or Serica) every night for up to one year. These are available over the counter (without a prescription).  Please call our office at [phone] for any questions or concerns.    Electrodesiccation and Curettage ("Scrape and Burn") Wound Care Instructions  Leave the original bandage on for 24 hours if possible.  If the bandage becomes soaked or soiled before that time, it is OK to remove it and examine the wound.  A small amount of post-operative bleeding is normal.  If excessive bleeding occurs, remove the bandage, place gauze over the site and apply continuous pressure (no peeking) over the area for 30 minutes. If this does not work, please call  our clinic as soon as possible or page your doctor if it is after hours.   Once a day, cleanse the wound with soap and water. It is fine to shower. If a thick crust develops you may use a Q-tip dipped into dilute hydrogen peroxide (mix 1:1 with water) to dissolve it.  Hydrogen peroxide can slow the healing process, so use it only as needed.    After washing, apply petroleum jelly (Vaseline) or an antibiotic ointment if your doctor prescribed one for you, followed by a bandage.    For best healing, the wound should be covered with a layer of ointment at all times. If you are not able to keep the area covered  with a bandage to hold the ointment in place, this may mean re-applying the ointment several times a day.  Continue this wound care until the wound has healed and is no longer open. It may take several weeks for the wound to heal and close.  Itching and mild discomfort is normal during the healing process.  If you have any discomfort, you can take Tylenol (acetaminophen) or ibuprofen as directed on the bottle. (Please do not take these if you have an allergy to them or cannot take them for another reason).  Some redness, tenderness and white or yellow material in the wound is normal healing.  If the area becomes very sore and red, or develops a thick yellow-green material (pus), it may be infected; please notify us.    Wound healing continues for up to one year following surgery. It is not unusual to experience pain in the scar from time to time during the interval.  If the pain becomes severe or the scar thickens, you should notify the office.    A slight amount of redness in a scar is expected for the first six months.  After six months, the redness will fade and the scar will soften and fade.  The color difference becomes less noticeable with time.  If there are any problems, return for a post-op surgery check at your earliest convenience.  To improve the appearance of the scar, you can use silicone scar gel, cream, or sheets (such as Mederma or Serica) every night for up to one year. These are available over the counter (without a prescription).  Please call our office at [phone] for any questions or concerns.     Due to recent changes in healthcare laws, you may see results of your pathology and/or laboratory studies on MyChart before the doctors have had a chance to review them. We understand that in some cases there may be results that are confusing or concerning to you. Please understand that not all results are received at the same time and often the doctors may need to interpret  multiple results in order to provide you with the best plan of care or course of treatment. Therefore, we ask that you please give [place] 2 business days to thoroughly review all your results before contacting the office for clarification. Should we see a critical lab result, you will be contacted sooner.   If You Need Anything After Your Visit  If you have any questions or concerns for your doctor, please call our main line at [phone] and press option 4 to reach your doctor's medical assistant. If no one answers, please leave a voicemail as directed and we will return your call as soon as possible. Messages left after 4 pm will be answered the following business day.  You may also send [place] a message via MyChart. We typically respond to MyChart messages within 1-2 business days.  For prescription refills, please ask your pharmacy to contact our office. Our fax number is [phone].  If you have an urgent issue when the clinic is closed that cannot wait until the next business day, you can page your doctor at the number below.    Please note that while we do our best to be available for urgent issues outside of office hours, we are not available 24/7.   If you have an urgent issue and are unable to reach [place], you may choose to seek medical care at your doctor's office, retail clinic, urgent care center, or emergency room.  If you have a medical emergency, please immediately call 911 or go to the emergency department.  Pager Numbers  - Dr. Gwen Pounds: [phone]  - Dr. Roseanne Reno: [phone]  - Dr. Katrinka Blazing: [phone]   In the event of inclement weather, please call our main line at [phone] for an update on the status of any delays or closures.  Dermatology Medication Tips: Please keep the boxes that topical medications come in in order to help keep track of the instructions about where and how to use these. Pharmacies typically print the medication instructions only on the boxes  and not directly on the medication tubes.   If your medication is too expensive, please contact our office at [phone] option 4 or send [place] a message through MyChart.   We are unable to tell what your co-pay for medications will be in advance as this is different depending on your insurance coverage. However, we may be able to find a substitute medication at lower cost or fill out paperwork to get insurance to cover a needed medication.   If a prior authorization is required to get your medication covered by your insurance company, please allow [place] 1-2 business days to complete this process.  Drug prices often vary depending on where the prescription is filled and some pharmacies may offer cheaper prices.  The website www.goodrx.com contains coupons for medications through different pharmacies. The prices here do not account for what the cost may be with help from insurance (it may be cheaper with your insurance), but the website can give you the price if you did not use any insurance.  - You can print the associated coupon and take it with your prescription to the pharmacy.  - You may also stop by our office during regular business hours and pick up a GoodRx coupon card.  - If you need your prescription sent electronically to a different pharmacy, notify our office through [facility] or by phone at [phone] option 4.     Si Usted Necesita Algo Despus de Su Visita  Tambin puede enviarnos un mensaje a travs de Clinical cytogeneticist. Por lo general respondemos a los mensajes de MyChart en el transcurso de 1 a 2 das hbiles.  Para renovar recetas, por favor pida a su farmacia que se ponga en contacto con nuestra oficina. Annie Sable de fax es [place] [phone].  Si tiene un asunto urgente cuando la clnica est cerrada y que no puede esperar hasta el siguiente da hbil, puede llamar/localizar a su doctor(a) al nmero que aparece a continuacin.   Por favor, tenga en cuenta que aunque  hacemos todo lo posible para estar disponibles para asuntos urgentes fuera del horario de [place], no estamos disponibles las 24 horas del da, los 7 [address]  [address] de la [place].  Si tiene un problema urgente y no puede comunicarse con nosotros, puede optar por buscar atencin mdica  en el consultorio de su doctor(a), en una clnica privada, en un centro de atencin urgente o en una sala de emergencias.  Si tiene Engineer, drilling, por favor llame inmediatamente al 911 o vaya a la sala de emergencias.  Nmeros de bper  - Dr. Gwen Pounds: [phone]  - Dra. Roseanne Reno: [phone]  - Dr. Katrinka Blazing: [phone]   En caso de inclemencias del tiempo, por favor llame a Lacy Duverney principal al [phone] para una actualizacin sobre el [place] de cualquier retraso o cierre.  Consejos para la medicacin en dermatologa: Por favor, guarde las cajas en las que vienen los medicamentos de uso tpico para ayudarle a seguir las instrucciones sobre dnde y cmo usarlos. Las farmacias generalmente imprimen las instrucciones del medicamento slo en las cajas y no directamente en los tubos del [place].   Si su medicamento es muy caro, por favor, pngase en contacto con Rolm Gala llamando al [phone] y presione la opcin 4 o envenos un mensaje a travs de Clinical cytogeneticist.   No podemos decirle cul ser su copago por los medicamentos por adelantado ya que esto es diferente dependiendo de la cobertura de su seguro. Sin embargo, es posible que podamos encontrar un medicamento sustituto a Audiological scientist un formulario para que el seguro cubra el medicamento que se considera necesario.   Si se requiere una autorizacin previa para que su compaa de seguros [place] su medicamento, por favor permtanos de 1 a 2 das hbiles para completar [address]  Los precios de los medicamentos varan con frecuencia dependiendo del Environmental consultant de dnde se surte la receta y alguna farmacias pueden ofrecer precios ms  baratos.  El sitio web www.goodrx.com tiene cupones para medicamentos de Health and safety inspector. Los precios aqu no tienen en cuenta lo que podra costar con la ayuda del seguro (puede ser ms barato con su seguro), pero el sitio web puede darle el precio si no utiliz Tourist information centre manager.  - Puede imprimir el cupn correspondiente y llevarlo con su receta a la farmacia.  - Tambin puede pasar por nuestra oficina durante el horario de atencin regular y Education officer, museum una tarjeta de cupones de GoodRx.  - Si necesita que su receta se enve electrnicamente a una farmacia diferente, informe a nuestra oficina a travs de MyChart de [place] o por telfono llamando al [phone] y presione la opcin 4.

## 2023-12-05 NOTE — Progress Notes (Signed)
 Follow-Up Visit   Subjective  [name] is a 89 y.o. male who presents for the following: patient reports a spot at nasal bridge that he is concerned about. Reports was told may consider bx if not healed.   The patient has spots, moles and lesions to be evaluated, some may be new or changing and the patient may have concern these could be cancer.  The following portions of the chart were reviewed this encounter and updated as appropriate: medications, allergies, medical history  Review of Systems:  No other skin or systemic complaints except as noted in HPI or Assessment and Plan.  Objective  Well appearing patient in no apparent distress; mood and affect are within normal limits.    A focused examination was performed of the following areas: nose  Relevant exam findings are noted in the Assessment and Plan.  nasal bridge 0.7 cm red papule   face x 5 (5), left lateral eyelid margin x 1 Erythematous thin papules/macules with gritty scale.   Assessment & Plan   NEOPLASM OF UNCERTAIN BEHAVIOR nasal bridge Epidermal / dermal shaving  Lesion diameter (cm):  0.7 Informed consent: discussed and consent obtained   Timeout: patient name, date of birth, surgical site, and procedure verified   Procedure prep:  Patient was prepped and draped in usual sterile fashion Prep type:  Isopropyl alcohol Anesthesia: the lesion was anesthetized in a standard fashion   Anesthetic:  1% lidocaine  w/ epinephrine  1-100,000 buffered w/ 8.4% NaHCO3 Instrument used: flexible razor blade   Hemostasis achieved with: pressure, aluminum chloride and electrodesiccation   Outcome: patient tolerated procedure well   Post-procedure details: sterile dressing applied and wound care instructions given   Dressing type: bandage and petrolatum    Destruction of lesion Complexity: extensive   Destruction method: electrodesiccation and curettage   Informed consent: discussed and consent obtained    Timeout:  patient name, date of birth, surgical site, and procedure verified Procedure prep:  Patient was prepped and draped in usual sterile fashion Prep type:  Isopropyl alcohol Anesthesia: the lesion was anesthetized in a standard fashion   Anesthetic:  1% lidocaine  w/ epinephrine  1-100,000 buffered w/ 8.4% NaHCO3 Curettage performed in three different directions: Yes   Electrodesiccation performed over the curetted area: Yes   Final wound size (cm):  1.2 Hemostasis achieved with:  pressure, aluminum chloride and electrodesiccation Outcome: patient tolerated procedure well with no complications   Post-procedure details: sterile dressing applied and wound care instructions given   Dressing type: bandage and petrolatum    Specimen 1 - Surgical pathology Differential Diagnosis: r/o BCC ED&C done   Check Margins: No 1.2 post defect  R/o bcc  ACTINIC KERATOSIS (6) face x 5 (5), left lateral eyelid margin x 1 Actinic keratoses are precancerous spots that appear secondary to cumulative UV radiation exposure/sun exposure over time. They are chronic with expected duration over 1 year. A portion of actinic keratoses will progress to squamous cell carcinoma of the skin. It is not possible to reliably predict which spots will progress to skin cancer and so treatment is recommended to prevent development of skin cancer.  Recommend daily broad spectrum sunscreen SPF 30+ to sun-exposed areas, reapply every 2 hours as needed.  Recommend staying in the shade or wearing long sleeves, sun glasses (UVA+UVB protection) and wide brim hats (4-inch brim around the entire circumference of the hat). Call for new or changing lesions. Destruction of lesion - face x 5 (5) Complexity: simple  Destruction method: cryotherapy   Informed consent: discussed and consent obtained   Timeout:  patient name, date of birth, surgical site, and procedure verified Lesion destroyed using liquid nitrogen: Yes   Region  frozen until ice ball extended beyond lesion: Yes   Outcome: patient tolerated procedure well with no complications   Post-procedure details: wound care instructions given    ACTINIC SKIN DAMAGE   BASAL CELL CARCINOMA (BCC) OF DORSUM OF NOSE    ACTINIC DAMAGE - chronic, secondary to cumulative UV radiation exposure/sun exposure over time - diffuse scaly erythematous macules with underlying dyspigmentation - Recommend daily broad spectrum sunscreen SPF 30+ to sun-exposed areas, reapply every 2 hours as needed.  - Recommend staying in the shade or wearing long sleeves, sun glasses (UVA+UVB protection) and wide brim hats (4-inch brim around the entire circumference of the hat). - Call for new or changing lesions.  Follow-up as scheduled  I, Eleanor Blush, CMA, am acting as scribe for Alm Rhyme, MD.   Documentation: I have reviewed the above documentation for accuracy and completeness, and I agree with the above.  Alm Rhyme, MD

## 2023-12-06 ENCOUNTER — Encounter: Payer: Self-pay | Admitting: Dermatology

## 2023-12-10 LAB — SURGICAL PATHOLOGY

## 2023-12-11 ENCOUNTER — Encounter: Payer: Self-pay | Admitting: Dermatology

## 2023-12-11 ENCOUNTER — Ambulatory Visit: Payer: Self-pay | Admitting: Dermatology

## 2023-12-11 NOTE — Telephone Encounter (Addendum)
 Called and discussed bx results. Verbalized understanding and denied further questions.  Will recheck at follow up  ----- Message from Alm Rhyme sent at 12/11/2023  9:04 AM EDT ----- FINAL DIAGNOSIS        1. Skin, nasal bridge :       BASAL CELL CARCINOMA, NODULAR PATTERN   Cancer = BCC Already treated Recheck next visit  ----- Message ----- From: Interface, Lab In [place] Sent: 12/10/2023   5:39 PM EDT To: Alm JAYSON Rhyme, MD

## 2023-12-31 ENCOUNTER — Ambulatory Visit (INDEPENDENT_AMBULATORY_CARE_PROVIDER_SITE_OTHER): Admitting: Family Medicine

## 2023-12-31 ENCOUNTER — Encounter: Payer: Self-pay | Admitting: Family Medicine

## 2023-12-31 VITALS — BP 160/82 | HR 64 | Temp 98.6°F | Ht 69.0 in | Wt 203.0 lb

## 2023-12-31 DIAGNOSIS — E785 Hyperlipidemia, unspecified: Secondary | ICD-10-CM

## 2023-12-31 DIAGNOSIS — G4733 Obstructive sleep apnea (adult) (pediatric): Secondary | ICD-10-CM

## 2023-12-31 DIAGNOSIS — G3184 Mild cognitive impairment, so stated: Secondary | ICD-10-CM | POA: Diagnosis not present

## 2023-12-31 DIAGNOSIS — Z8673 Personal history of transient ischemic attack (TIA), and cerebral infarction without residual deficits: Secondary | ICD-10-CM

## 2023-12-31 DIAGNOSIS — I10 Essential (primary) hypertension: Secondary | ICD-10-CM | POA: Diagnosis not present

## 2023-12-31 DIAGNOSIS — K219 Gastro-esophageal reflux disease without esophagitis: Secondary | ICD-10-CM

## 2023-12-31 MED ORDER — MEMANTINE HCL 5 MG PO TABS: ORAL_TABLET | ORAL | 3 refills | Status: DC

## 2023-12-31 NOTE — Patient Instructions (Addendum)
 I do recommend completing driving test for safety.  Continue donepezil  5mg  nightly. Start namenda  5mg  nightly for 1 wk then increase to 5mg  twice daily.  Drop pantoprazole  20mg  to every other day. Monitor reflux.  Good to see you today Return after 04/12/2024 for physical.

## 2023-12-31 NOTE — Progress Notes (Unsigned)
 Ph: [phone] Fax: [phone]   Patient ID: [name], male    DOB: 1931-09-14, 89 y.o.   MRN: [id number]  This visit was conducted in person.  BP (!) 160/58   Pulse 64   Temp 98.6 F (37 C) (Oral)   Ht 5' 9 (1.753 m)   Wt 203 lb (92.1 kg)   SpO2 97%   BMI 29.98 kg/m   BP Readings from Last 3 Encounters:  12/31/23 (!) 160/58  11/26/23 (!) 158/50  11/14/23 (!) 156/56  148/82 right arm 158/68 left arm  CC: 3 mo f/u visit  Subjective:   HPI: [name] is a 89 y.o. male presenting on 12/31/2023 for Medical Management of Chronic Issues (Pt here for 3 mth f/u/Pt accompanied by wife [name])   See prior note for details.  Hospitalized 10/2023 for gallstone pancreatitis s/p lap chole.  Previous hospitalization 09/2023 for TIA with subsequent permissive hypertension. Imaging showed chronic cerebellar strokes and significant large vessel plaque/occlusions.  States he was recommended to stay with permissive hypertension due to this. Brings BP log - 120s-170/60-70, HR 50-60 over the past week.   On pantoprazole  20mg  since hospital - denies GERD symptoms.   Recent nasal bridge BCC removal [name]) 12/05/2023.   [facility] - continues donepezil  5mg  nightly and sertraline . Notes memory trouble is worsening.  Last saw neurology Dr [name] 11/2023 for OSA on CPAP and f/u TIA and [facility].  Was recommended to undergo formal driving test to assess driving safety. He does not want to do this.      Relevant past medical, surgical, family and social history reviewed and updated as indicated. Interim medical history since our last visit reviewed. Allergies and medications reviewed and updated. Outpatient Medications Prior to Visit  Medication Sig Dispense Refill   amLODipine  (NORVASC ) 10 MG tablet Take 1 tablet (10 mg total) by mouth daily. 90 tablet 4   aspirin  EC 81 MG tablet Take 1 tablet (81 mg total) by mouth daily. Swallow whole. 30 tablet 12   azelastine  (ASTELIN ) 0.1 % nasal  spray Place 1 spray into both nostrils 2 (two) times daily. Use in each nostril as directed 30 mL 12   cetirizine  (ZYRTEC ) 10 MG tablet Take 10 mg by mouth daily. Alternates weekly with allegra. OTC     clopidogrel  (PLAVIX ) 75 MG tablet Take 1 tablet (75 mg total) by mouth daily. 90 tablet 4   Coenzyme Q10-Vitamin E  (QUNOL ULTRA COQ10 PO) Take 1 capsule by mouth daily. OTC     cyanocobalamin  (VITAMIN B12) 1000 MCG tablet Take 1 tablet (1,000 mcg total) by mouth daily. (Patient taking differently: Take 1,000 mcg by mouth daily. OTC)     donepezil  (ARICEPT ) 5 MG tablet Take 1 tablet (5 mg total) by mouth at bedtime. 90 tablet 4   esomeprazole  (NEXIUM ) 20 MG capsule TAKE 1 CAPSULE BY MOUTH DAILY AT 12 PM     fexofenadine (ALLEGRA) 180 MG tablet Take 180 mg by mouth.     hydrALAZINE  (APRESOLINE ) 50 MG tablet Take 1 tablet (50 mg total) by mouth in the morning and at bedtime.     losartan  (COZAAR ) 50 MG tablet Take 1 tablet (50 mg total) by mouth daily. 90 tablet 4   metoprolol  succinate (TOPROL -XL) 25 MG 24 hr tablet TAKE 1 TABLET BY MOUTH DAILY WITH OR IMMEDIATELY FOLLOWING A MEAL     Misc Natural Products (OSTEO BI-FLEX TRIPLE STRENGTH) TABS Take 1 tablet by mouth daily. + vitamin D . OTC  Multiple Vitamins-Minerals (ONE-A-DAY MENS 50+) TABS Take 1 tablet by mouth daily. OTC     pantoprazole  (PROTONIX ) 20 MG tablet Take 1 tablet (20 mg total) by mouth daily. 90 tablet 4   polyethylene glycol (MIRALAX  / GLYCOLAX ) packet Take 8.5 g by mouth daily.      pravastatin  (PRAVACHOL ) 20 MG tablet Take 1 tablet (20 mg total) by mouth 2 (two) times a week. 24 tablet 4   sertraline  (ZOLOFT ) 25 MG tablet Take 1 tablet (25 mg total) by mouth daily. 30 tablet 3   triamcinolone  cream (KENALOG ) 0.1 % Apply to affected areas rash on right hip twice daily until improved. Avoid applying to face, groin, and axilla. Use as directed. Long-term use can cause thinning of the skin. 80 g 0   No facility-administered  medications prior to visit.     Per HPI unless specifically indicated in ROS section below Review of Systems  Objective:  BP (!) 160/58   Pulse 64   Temp 98.6 F (37 C) (Oral)   Ht 5' 9 (1.753 m)   Wt 203 lb (92.1 kg)   SpO2 97%   BMI 29.98 kg/m   Wt Readings from Last 3 Encounters:  12/31/23 203 lb (92.1 kg)  11/14/23 199 lb 6.4 oz (90.4 kg)  11/02/23 205 lb (93 kg)      Physical Exam Vitals and nursing note reviewed.  Constitutional:      Appearance: Normal appearance. He is not ill-appearing.  HENT:     Head: Normocephalic and atraumatic.     Mouth/Throat:     Mouth: Mucous membranes are moist.     Pharynx: Oropharynx is clear. No oropharyngeal exudate or posterior oropharyngeal erythema.  Eyes:     Extraocular Movements: Extraocular movements intact.     Pupils: Pupils are equal, round, and reactive to light.  Cardiovascular:     Rate and Rhythm: Regular rhythm. Bradycardia present.     Pulses: Normal pulses.     Heart sounds: Normal heart sounds. No murmur heard. Pulmonary:     Effort: Pulmonary effort is normal. No respiratory distress.     Breath sounds: Normal breath sounds. No wheezing, rhonchi or rales.  Musculoskeletal:     Right lower leg: No edema.     Left lower leg: No edema.  Skin:    General: Skin is warm and dry.     Findings: No rash.  Neurological:     Mental Status: He is alert.  Psychiatric:        Mood and Affect: Mood normal.        Behavior: Behavior normal.       Lab Results  Component Value Date   NA 137 11/14/2023   CL 101 11/14/2023   K 4.6 11/14/2023   CO2 30 11/14/2023   BUN 21 11/14/2023   CREATININE 1.12 11/14/2023   GFR 58.09 (L) 11/14/2023   CALCIUM  8.9 11/14/2023   PHOS 4.3 03/01/2020   ALBUMIN 4.3 11/14/2023   GLUCOSE 87 11/14/2023    Lab Results  Component Value Date   ALT 26 11/14/2023   AST 21 11/14/2023   ALKPHOS 89 11/14/2023   BILITOT 0.6 11/14/2023    Lab Results  Component Value Date   WBC 5.2  11/14/2023   HGB 12.3 (L) 11/14/2023   HCT 36.7 (L) 11/14/2023   MCV 94.8 11/14/2023   PLT 308.0 11/14/2023    Assessment & Plan:   Problem List Items Addressed This Visit   None  No orders of the defined types were placed in this encounter.   No orders of the defined types were placed in this encounter.   Patient Instructions  I do recommend completing driving test for safety.    Follow up plan: Return if symptoms worsen or fail to improve.  Anton Blas, MD

## 2023-12-31 NOTE — Assessment & Plan Note (Signed)
 Wife notes worsening memory difficulty. ?vascular dementia vs other Has been recommended driving test prior to continuing driving. Encouraged he schedule this.  Continue donepezil  5mg  nightly - titration limited by bradycardia.  Will add namenda  5mg  nightly x1 wk and if tolerated increase to bid.  Will continue sertraline  25mg  daily.

## 2024-01-01 DIAGNOSIS — K080 Exfoliation of teeth due to systemic causes: Secondary | ICD-10-CM | POA: Diagnosis not present

## 2024-01-01 NOTE — Assessment & Plan Note (Signed)
 Chronic, states following permissive hypertension since TIA 09/2023 due to large vessel ATH/occlusion found on imaging.  Left arm BP runs higher than on right, suspect due to component of subclavian atherosclerosis. Will titrate BP based on R arm readings. Home readings largely better controlled - continue current regimen.

## 2024-01-01 NOTE — Assessment & Plan Note (Signed)
 Other statin intolerance, continue pravastatin  20mg  twice weekly.

## 2024-01-01 NOTE — Assessment & Plan Note (Signed)
 Continue nightly CPAP followed by neurology.

## 2024-01-01 NOTE — Assessment & Plan Note (Signed)
 Continue plavix  and pravastatin .

## 2024-01-01 NOTE — Assessment & Plan Note (Signed)
 Longterm PPI use.  No significant GERD at this time.  Drop pantoprazole  20mg  to every other day dosing.

## 2024-01-01 NOTE — Addendum Note (Signed)
 Addended by: RILLA BALLER on: 01/01/2024 07:26 AM   Modules accepted: Level of Service

## 2024-01-10 ENCOUNTER — Encounter: Payer: Self-pay | Admitting: Adult Health

## 2024-01-15 DIAGNOSIS — M9905 Segmental and somatic dysfunction of pelvic region: Secondary | ICD-10-CM | POA: Diagnosis not present

## 2024-01-15 DIAGNOSIS — M5416 Radiculopathy, lumbar region: Secondary | ICD-10-CM | POA: Diagnosis not present

## 2024-01-15 DIAGNOSIS — M9903 Segmental and somatic dysfunction of lumbar region: Secondary | ICD-10-CM | POA: Diagnosis not present

## 2024-01-15 DIAGNOSIS — M955 Acquired deformity of pelvis: Secondary | ICD-10-CM | POA: Diagnosis not present

## 2024-02-05 DIAGNOSIS — M9905 Segmental and somatic dysfunction of pelvic region: Secondary | ICD-10-CM | POA: Diagnosis not present

## 2024-02-05 DIAGNOSIS — M5416 Radiculopathy, lumbar region: Secondary | ICD-10-CM | POA: Diagnosis not present

## 2024-02-05 DIAGNOSIS — M9903 Segmental and somatic dysfunction of lumbar region: Secondary | ICD-10-CM | POA: Diagnosis not present

## 2024-02-05 DIAGNOSIS — M955 Acquired deformity of pelvis: Secondary | ICD-10-CM | POA: Diagnosis not present

## 2024-02-06 DIAGNOSIS — M9905 Segmental and somatic dysfunction of pelvic region: Secondary | ICD-10-CM | POA: Diagnosis not present

## 2024-02-06 DIAGNOSIS — M5416 Radiculopathy, lumbar region: Secondary | ICD-10-CM | POA: Diagnosis not present

## 2024-02-06 DIAGNOSIS — M955 Acquired deformity of pelvis: Secondary | ICD-10-CM | POA: Diagnosis not present

## 2024-02-06 DIAGNOSIS — M9903 Segmental and somatic dysfunction of lumbar region: Secondary | ICD-10-CM | POA: Diagnosis not present

## 2024-02-12 ENCOUNTER — Other Ambulatory Visit: Payer: Self-pay | Admitting: Family Medicine

## 2024-02-12 DIAGNOSIS — R454 Irritability and anger: Secondary | ICD-10-CM

## 2024-02-13 DIAGNOSIS — M5416 Radiculopathy, lumbar region: Secondary | ICD-10-CM | POA: Diagnosis not present

## 2024-02-13 DIAGNOSIS — M9903 Segmental and somatic dysfunction of lumbar region: Secondary | ICD-10-CM | POA: Diagnosis not present

## 2024-02-13 DIAGNOSIS — M955 Acquired deformity of pelvis: Secondary | ICD-10-CM | POA: Diagnosis not present

## 2024-02-13 DIAGNOSIS — M9905 Segmental and somatic dysfunction of pelvic region: Secondary | ICD-10-CM | POA: Diagnosis not present

## 2024-03-03 DIAGNOSIS — M5416 Radiculopathy, lumbar region: Secondary | ICD-10-CM | POA: Diagnosis not present

## 2024-03-03 DIAGNOSIS — M955 Acquired deformity of pelvis: Secondary | ICD-10-CM | POA: Diagnosis not present

## 2024-03-03 DIAGNOSIS — M9905 Segmental and somatic dysfunction of pelvic region: Secondary | ICD-10-CM | POA: Diagnosis not present

## 2024-03-03 DIAGNOSIS — M9903 Segmental and somatic dysfunction of lumbar region: Secondary | ICD-10-CM | POA: Diagnosis not present

## 2024-03-04 DIAGNOSIS — K080 Exfoliation of teeth due to systemic causes: Secondary | ICD-10-CM | POA: Diagnosis not present

## 2024-03-05 ENCOUNTER — Ambulatory Visit (INDEPENDENT_AMBULATORY_CARE_PROVIDER_SITE_OTHER): Payer: Medicare Other

## 2024-03-05 VITALS — Ht 69.0 in | Wt 203.0 lb

## 2024-03-05 DIAGNOSIS — Z0000 Encounter for general adult medical examination without abnormal findings: Secondary | ICD-10-CM

## 2024-03-05 NOTE — Progress Notes (Addendum)
 Please attest and cosign this visit due to patients primary care provider not being in the office at the time the visit was completed.    Subjective:   [name] is a 89 y.o. who presents for a Medicare Wellness preventive visit.  As a reminder, Annual Wellness Visits don't include a physical exam, and some assessments may be limited, especially if this visit is performed virtually. We may recommend an in-person follow-up visit with your provider if needed.  Visit Complete: Virtual I connected with  [name] on 03/05/24 by a audio enabled telemedicine application and verified that I am speaking with the correct person using two identifiers.  Patient Location: Home  Provider Location: Office/Clinic  I discussed the limitations of evaluation and management by telemedicine. The patient expressed understanding and agreed to proceed.  Vital Signs: Because this visit was a virtual/telehealth visit, some criteria may be missing or patient reported. Any vitals not documented were not able to be obtained and vitals that have been documented are patient reported.  VideoDeclined- This patient declined Librarian, academic. Therefore the visit was completed with audio only.  Persons Participating in Visit: Patient.  AWV Questionnaire: No: Patient Medicare AWV questionnaire was not completed prior to this visit.  Cardiac Risk Factors include: advanced age (>59men, >66 women);dyslipidemia;hypertension;male gender     Objective:    Today's Vitals   03/05/24 0815  Weight: 203 lb (92.1 kg)  Height: 5' 9 (1.753 m)   Body mass index is 29.98 kg/m.     03/05/2024    8:29 AM 11/02/2023   11:43 PM 11/02/2023    3:04 PM 11/02/2023    6:19 AM 09/21/2023    4:00 PM 09/21/2023   10:26 AM 03/05/2023    8:06 AM  Advanced Directives  Does Patient Have a Medical Advance Directive? Yes Yes Yes Yes No No Yes  Type of Estate Agent of  [place];Living will Healthcare Power of Attorney Living will Living will   Healthcare Power of [place];Living will  Does patient want to make changes to medical advance directive?  No - Patient declined  No - Patient declined     Copy of Healthcare Power of Attorney in Chart? Yes - validated most recent copy scanned in chart (See row information) No - copy requested     No - copy requested  Would patient like information on creating a medical advance directive?     Yes (Inpatient - patient requests chaplain consult to create a medical advance directive)      Current Medications (verified) Outpatient Encounter Medications as of 03/05/2024  Medication Sig   amLODipine  (NORVASC ) 10 MG tablet Take 1 tablet (10 mg total) by mouth daily.   aspirin  EC 81 MG tablet Take 1 tablet (81 mg total) by mouth daily. Swallow whole.   azelastine  (ASTELIN ) 0.1 % nasal spray Place 1 spray into both nostrils 2 (two) times daily. Use in each nostril as directed   cetirizine  (ZYRTEC ) 10 MG tablet Take 10 mg by mouth daily. Alternates weekly with allegra. OTC   clopidogrel  (PLAVIX ) 75 MG tablet Take 1 tablet (75 mg total) by mouth daily.   Coenzyme Q10-Vitamin E  (QUNOL ULTRA COQ10 PO) Take 1 capsule by mouth daily. OTC   cyanocobalamin  (VITAMIN B12) 1000 MCG tablet Take 1 tablet (1,000 mcg total) by mouth daily. (Patient taking differently: Take 1,000 mcg by mouth daily. OTC)   donepezil  (ARICEPT ) 5 MG tablet Take 1 tablet (5 mg  total) by mouth at bedtime.   fexofenadine (ALLEGRA) 180 MG tablet Take 180 mg by mouth.   hydrALAZINE  (APRESOLINE ) 50 MG tablet Take 1 tablet (50 mg total) by mouth in the morning and at bedtime.   losartan  (COZAAR ) 50 MG tablet Take 1 tablet (50 mg total) by mouth daily.   memantine  (NAMENDA ) 5 MG tablet Take 1 tablet (5 mg total) by mouth at bedtime for 7 days, THEN 1 tablet (5 mg total) 2 (two) times daily.   metoprolol  succinate (TOPROL -XL) 25 MG 24 hr tablet TAKE 1 TABLET BY MOUTH DAILY  WITH OR IMMEDIATELY FOLLOWING A MEAL   Misc Natural Products (OSTEO BI-FLEX TRIPLE STRENGTH) TABS Take 1 tablet by mouth daily. + vitamin D . OTC   Multiple Vitamins-Minerals (ONE-A-DAY MENS 50+) TABS Take 1 tablet by mouth daily. OTC   pantoprazole  (PROTONIX ) 20 MG tablet Take 1 tablet (20 mg total) by mouth every other day.   polyethylene glycol (MIRALAX  / GLYCOLAX ) packet Take 8.5 g by mouth daily.    pravastatin  (PRAVACHOL ) 20 MG tablet Take 1 tablet (20 mg total) by mouth 2 (two) times a week.   sertraline  (ZOLOFT ) 25 MG tablet TAKE 1 TABLET(25 MG) BY MOUTH DAILY   triamcinolone  cream (KENALOG ) 0.1 % Apply to affected areas rash on right hip twice daily until improved. Avoid applying to face, groin, and axilla. Use as directed. Long-term use can cause thinning of the skin.   No facility-administered encounter medications on file as of 03/05/2024.    Allergies (verified) Lisinopril and Atorvastatin    History: Past Medical History:  Diagnosis Date   Actinic keratosis    AV block, 1st degree 10/05/2021   With bradycardia 09/2021 - drop Toprol  XL to 25mg  daily, increase amlodipine  to 10mg  daily. Donepezil  can also cause AV block.      BCC (basal cell carcinoma of skin) 12/05/2023   nasal bridge - treated with ED&C   Carotid stenosis, asymptomatic, bilateral 09/16/2016   Diffuse 1-39% bilaterally, but involving ICA, ECA, CCA - rpt 1 yr (09/2016)   Complication of anesthesia    slow to wake up after a colonoscopy years ago   Diverticulitis    Dyslipidemia 09/04/2017   Atorvastatin  elevated CPK levels.      GERD (gastroesophageal reflux disease)    Gout    History of cerebellar stroke 01/09/2022   Cerebellar stroke     History of osteomyelitis 10/2015   left foot   History of squamous cell carcinoma of skin    HTN (hypertension)    Infection of left great toe due to methicillin resistant Staphylococcus aureus (MRSA) 2015   Hx s/p IV abx 7 wks through PICC (~2015)     MCI (mild  cognitive impairment) 02/17/2020   MMSE (02/2020) 24/30, 25 with cue. Misses 3 orientation, 2 recall (1 with cue), 1 language. Independent ADLs and IADLs. 4/4 clock drawing test.   MMSE 08/2021 23/30, CDT 4/4     OSA on CPAP 09/01/2016   Osteoarthritis    back, neck, knees - multiple joints   Personal history of malignant neoplasm of larynx 2009   s/p XRT, Skin Cancer- pre cancerous   Pre-diabetes    Prediabetes 01/09/2022   Seasonal allergies    Sensorineural hearing loss (SNHL) of both ears 09/01/2016   Sinus bradycardia 02/07/2022   Sinus bradycardia     Squamous cell carcinoma of skin unknown   Treated in [facility] - SCC of the L post auricular    Squamous  cell carcinoma of skin 10/26/2022   R med knee, EDC   Past Surgical History:  Procedure Laterality Date   ACHILLES TENDON REPAIR Left    AMPUTATION Left 05/16/2019   LEFT GREAT TOE AMPUTATION for osteomyelitis Claud, Jerona GAILS, MD), 2nd toe removed   APPENDECTOMY     CHOLECYSTECTOMY     [facility] physician   COLONOSCOPY     THROAT SURGERY N/A 2009   ?vocal cord vs larynx nodule removal, had XRT after this but states it was not cancer   TONSILLECTOMY     TREATMENT FISTULA ANAL     Family History  Problem Relation Age of Onset   Hypertension Mother    Hypertension Father    Cancer Other        unknown - niece   CAD Sister 89       stent (80% blockage)   CAD Brother 72       stent (100% blockage)   Social History   Socioeconomic History   Marital status: Married    Spouse name: Not on file   Number of children: Not on file   Years of education: Not on file   Highest education level: Bachelor's degree (e.g., BA, AB, BS)  Occupational History   Not on file  Tobacco Use   Smoking status: Never   Smokeless tobacco: Never  Vaping Use   Vaping status: Never Used  Substance and Sexual Activity   Alcohol use: Yes    Comment: may have something occasionally   Drug use: No   Sexual activity: Not Currently   Other Topics Concern   Not on file  Social History Narrative   [name]   Widower. 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   Edu: college chemistry degree   Right handed   Activity: walking 9/10th mile daily    Caffeine: Drinks 1 cup of decaf coffee every morning    Social Drivers of Corporate Investment Banker Strain: Low Risk  (03/05/2024)   Overall Financial Resource Strain (CARDIA)    Difficulty of Paying Living Expenses: Not hard at all  Food Insecurity: No Food Insecurity (03/05/2024)   Hunger Vital Sign    Worried About Running Out of Food in the Last Year: Never true    Ran Out of Food in the Last Year: Never true  Transportation Needs: No Transportation Needs (03/05/2024)   PRAPARE - Administrator, Civil Service (Medical): No    Lack of Transportation (Non-Medical): No  Physical Activity: Sufficiently Active (03/05/2024)   Exercise Vital Sign    Days of Exercise per Week: 5 days    Minutes of Exercise per Session: 30 min  Stress: No Stress Concern Present (03/05/2024)   [name] of Occupational Health - Occupational Stress Questionnaire    Feeling of Stress: Not at all  Social Connections: Moderately Integrated (03/05/2024)   Social Connection and Isolation Panel    Frequency of Communication with Friends and Family: Three times a week    Frequency of Social Gatherings with Friends and Family: Three times a week    Attends Religious Services: More than 4 times per year    Active Member of Clubs or Organizations: No    Attends Banker Meetings: Never    Marital Status: Married    Tobacco Counseling Counseling given: Not Answered    Clinical Intake:  Pre-visit preparation completed: Yes  Pain : No/denies pain  BMI - recorded: 29.98 Nutritional Status: BMI 25 -29 Overweight Nutritional Risks: None Diabetes: No  Lab Results  Component Value Date   HGBA1C 5.7  03/27/2023   HGBA1C 5.6 01/09/2022   HGBA1C 5.6 02/12/2019     How often do you need to have someone help you when you read instructions, pamphlets, or other written materials from your doctor or pharmacy?: 1 - Never  Interpreter Needed?: No  Comments: lives with wife Information entered by :: B.Abou Sterkel,LPN   Activities of Daily Living     03/05/2024    8:30 AM 11/02/2023   11:57 PM  In your present state of health, do you have any difficulty performing the following activities:  Hearing? 1   Vision? 0   Difficulty concentrating or making decisions? 1   Walking or climbing stairs? 0   Dressing or bathing? 0   Doing errands, shopping? 0 0  Preparing Food and eating ? N   Using the Toilet? N   In the past six months, have you accidently leaked urine? N   Do you have problems with loss of bowel control? N   Managing your Medications? N   Managing your Finances? Y   Comment turned over to wife:can manage better   Housekeeping or managing your Housekeeping? N     Patient Care Team: Rilla Baller, MD as PCP - General (Family Medicine) Court Dorn PARAS, MD as PCP - Cardiology (Cardiology) Mittie Gaskin, MD as Referring Physician (Ophthalmology)  I have updated your Care Teams any recent Medical Services you may have received from other providers in the past year.     Assessment:   This is a routine wellness examination for [name].  Hearing/Vision screen Hearing Screening - Comments:: Patient denies any hearing difficulties with hearing aids Vision Screening - Comments:: Pt says their vision is good with glasses for driving only Dr  Gaskin Mittie   Goals Addressed             This Visit's Progress    COMPLETED: DIET - INCREASE WATER INTAKE       Starting 08/28/2017, I will attempt to drink at least 6-8 glasses of water daily.      COMPLETED: Increase physical activity       When weather permits, I will resume walking at least 1 mile in morning.       Patient Stated   On track    03/05/24-, I will continue to walk daily for 30 minutes.        Depression Screen     03/05/2024    8:24 AM 12/31/2023    2:43 PM 11/14/2023   11:25 AM 09/28/2023    2:07 PM 07/13/2023    3:07 PM 03/05/2023    8:05 AM 12/05/2022    9:00 AM  PHQ 2/9 Scores  PHQ - 2 Score 0 0 1 0 0 0 0  PHQ- 9 Score  0 10 3 1  3     Fall Risk     03/05/2024    8:20 AM 12/31/2023    2:43 PM 11/14/2023   11:25 AM 09/28/2023    2:07 PM 07/13/2023    3:07 PM  Fall Risk   Falls in the past year? 1 0 1 1 0  Comment tripped at home      Number falls in past yr: 0 0  0   Injury with Fall? 0 0  0   Risk for fall due to : Impaired balance/gait No Fall  Risks Other (Comment)    Risk for fall due to: Comment uses cane sometimes      Follow up Education provided;Falls prevention discussed Falls evaluation completed Falls evaluation completed      MEDICARE RISK AT HOME:  Medicare Risk at Home Any stairs in or around the home?: Yes If so, are there any without handrails?: Yes Home free of loose throw rugs in walkways, pet beds, electrical cords, etc?: Yes Adequate lighting in your home to reduce risk of falls?: Yes Life alert?: No Use of a cane, walker or w/c?: Yes (cane occassionally) Grab bars in the bathroom?: No Shower chair or bench in shower?: Yes Elevated toilet seat or a handicapped toilet?: Yes  TIMED UP AND GO:  Was the test performed?  No  Cognitive Function: 6CIT completed    02/11/2020   12:12 PM 08/28/2017   12:02 PM 08/23/2016   11:10 AM  MMSE - Mini Mental State Exam  Orientation to time 5 5 5    Orientation to Place 5 5 5    Registration 3 3 3    Attention/ Calculation 5 0 0   Recall 1 2 3    Recall-comments  unable to recall 1 of 3 words   Language- name 2 objects  0 0   Language- repeat 1 1 1   Language- follow 3 step command  3 3   Language- read & follow direction  0 0   Write a sentence  0 0   Copy design  0 0   Total score  19 20      Data  saved with a previous flowsheet row definition        03/05/2024    8:33 AM 03/05/2023    8:07 AM 02/22/2022    8:49 AM  6CIT Screen  What Year? 0 points 0 points 0 points  What month? 0 points 0 points 0 points  What time? 0 points 0 points 0 points  Count back from 20 0 points 0 points 0 points  Months in reverse 0 points 0 points 0 points  Repeat phrase 0 points 0 points 4 points  Total Score 0 points 0 points 4 points    Immunizations Immunization History  Administered Date(s) Administered   Fluad Quad(high Dose 65+) 02/17/2020, 03/06/2022   Fluad Trivalent(High Dose 65+) 04/13/2023   Hepatitis A 12/06/2012   INFLUENZA, HIGH DOSE SEASONAL PF 02/11/2018, 01/16/2019, 01/20/2021   Influenza-Unspecified 01/19/2016, 01/30/2017   Moderna Covid-19 Fall Seasonal Vaccine 13yrs & older 08/30/2023   PFIZER(Purple Top)SARS-COV-2 Vaccination 05/26/2019, 06/16/2019, 02/06/2020   Pfizer Covid-19 Vaccine Bivalent Booster 75yrs & up 02/01/2021   Pneumococcal Conjugate-13 07/21/2014   Pneumococcal Polysaccharide-23 09/09/2010   Td 08/06/2004   Tdap 09/10/2012   Zoster, Live 02/06/2008    Screening Tests Health Maintenance  Topic Date Due   Zoster Vaccines- Shingrix  (1 of 2) 12/22/1950   DTaP/Tdap/Td (3 - Td or Tdap) 09/11/2022   COVID-19 Vaccine (6 - 2025-26 season) 01/07/2024   Influenza Vaccine  08/05/2024 (Originally 12/07/2023)   Medicare Annual Wellness (AWV)  03/05/2025   Pneumococcal Vaccine: 50+ Years  Completed   Meningococcal B Vaccine  Aged Out    Health Maintenance Items Addressed: Pt scheduled for Influenza vaccine on 03/11/24. Pt will get other vaccines from his pharmacy when available  Additional Screening:  Vision Screening: Recommended annual ophthalmology exams for early detection of glaucoma and other disorders of the eye. Is the patient up to date with their annual eye exam?  Yes  Who is the provider or what is the name of the office in which the patient  attends annual eye exams? Dr Mittie  Dental Screening: Recommended annual dental exams for proper oral hygiene  Community Resource Referral / Chronic Care Management: CRR required this visit?  No   CCM required this visit?  Appt scheduled for Flu vaccine   Plan:    I have personally reviewed and noted the following in the patient's chart:   Medical and social history Use of alcohol, tobacco or illicit drugs  Current medications and supplements including opioid prescriptions. Patient is not currently taking opioid prescriptions. Functional ability and status Nutritional status Physical activity Advanced directives List of other physicians Hospitalizations, surgeries, and ER visits in previous 12 months Vitals Screenings to include cognitive, depression, and falls Referrals and appointments  In addition, I have reviewed and discussed with patient certain preventive protocols, quality metrics, and best practice recommendations. A written personalized care plan for preventive services as well as general preventive health recommendations were provided to patient.   Erminio LITTIE Saris, LPN   [id number]   After Visit Summary: (MyChart) Due to this being a telephonic visit, the after visit summary with patients personalized plan was offered to patient via MyChart   Notes: Pt relays his BP was 151/68 and 131/67 yesterday at home. He says it is higher when he first up then he retakes later and it is down. Pt encouraged to continue to monitor BP and bring record in for Dr Rilla at 04/14/24 appt.

## 2024-03-05 NOTE — Patient Instructions (Signed)
 [name],  Thank you for taking the time for your Medicare Wellness Visit. I appreciate your continued commitment to your health goals. Please review the care plan we discussed, and feel free to reach out if I can assist you further.  Medicare recommends these wellness visits once per year to help you and your care team stay ahead of potential health issues. These visits are designed to focus on prevention, allowing your provider to concentrate on managing your acute and chronic conditions during your regular appointments.  Please note that Annual Wellness Visits do not include a physical exam. Some assessments may be limited, especially if the visit was conducted virtually. If needed, we may recommend a separate in-person follow-up with your provider.  Ongoing Care Seeing your primary care provider every 3 to 6 months helps us  monitor your health and provide consistent, personalized care.   Referrals If a referral was made during today's visit and you haven't received any updates within two weeks, please contact the referred provider directly to check on the status.  Recommended Screenings:  Health Maintenance  Topic Date Due   Zoster (Shingles) Vaccine (1 of 2) 12/22/1950   DTaP/Tdap/Td vaccine (3 - Td or Tdap) 09/11/2022   COVID-19 Vaccine (6 - 2025-26 season) 01/07/2024   Flu Shot  08/05/2024*   Medicare Annual Wellness Visit  03/05/2025   Pneumococcal Vaccine for age over 15  Completed   Meningitis B Vaccine  Aged Out  *Topic was postponed. The date shown is not the original due date.       11/02/2023   11:43 PM  Advanced Directives  Does Patient Have a Medical Advance Directive? Yes  Type of Advance Directive Healthcare Power of Attorney  Does patient want to make changes to medical advance directive? No - Patient declined  Copy of Healthcare Power of Attorney in Chart? No - copy requested   Advance Care Planning is important because it: Ensures you receive medical care  that aligns with your values, goals, and preferences. Provides guidance to your family and loved ones, reducing the emotional burden of decision-making during critical moments.  Vision: Annual vision screenings are recommended for early detection of glaucoma, cataracts, and diabetic retinopathy. These exams can also reveal signs of chronic conditions such as diabetes and high blood pressure.  Dental: Annual dental screenings help detect early signs of oral cancer, gum disease, and other conditions linked to overall health, including heart disease and diabetes.

## 2024-03-10 ENCOUNTER — Other Ambulatory Visit: Payer: Self-pay | Admitting: Family Medicine

## 2024-03-10 DIAGNOSIS — E785 Hyperlipidemia, unspecified: Secondary | ICD-10-CM

## 2024-03-11 ENCOUNTER — Ambulatory Visit

## 2024-03-11 DIAGNOSIS — I10 Essential (primary) hypertension: Secondary | ICD-10-CM | POA: Diagnosis not present

## 2024-03-11 DIAGNOSIS — G4733 Obstructive sleep apnea (adult) (pediatric): Secondary | ICD-10-CM | POA: Diagnosis not present

## 2024-03-24 DIAGNOSIS — M5416 Radiculopathy, lumbar region: Secondary | ICD-10-CM | POA: Diagnosis not present

## 2024-03-24 DIAGNOSIS — M9903 Segmental and somatic dysfunction of lumbar region: Secondary | ICD-10-CM | POA: Diagnosis not present

## 2024-03-24 DIAGNOSIS — M955 Acquired deformity of pelvis: Secondary | ICD-10-CM | POA: Diagnosis not present

## 2024-03-24 DIAGNOSIS — M9905 Segmental and somatic dysfunction of pelvic region: Secondary | ICD-10-CM | POA: Diagnosis not present

## 2024-04-05 ENCOUNTER — Other Ambulatory Visit: Payer: Self-pay | Admitting: Family Medicine

## 2024-04-05 DIAGNOSIS — D649 Anemia, unspecified: Secondary | ICD-10-CM

## 2024-04-05 DIAGNOSIS — R748 Abnormal levels of other serum enzymes: Secondary | ICD-10-CM

## 2024-04-05 DIAGNOSIS — E785 Hyperlipidemia, unspecified: Secondary | ICD-10-CM

## 2024-04-05 DIAGNOSIS — M1A00X0 Idiopathic chronic gout, unspecified site, without tophus (tophi): Secondary | ICD-10-CM

## 2024-04-05 DIAGNOSIS — R7303 Prediabetes: Secondary | ICD-10-CM

## 2024-04-06 ENCOUNTER — Other Ambulatory Visit: Payer: Self-pay | Admitting: Family Medicine

## 2024-04-06 DIAGNOSIS — I10 Essential (primary) hypertension: Secondary | ICD-10-CM

## 2024-04-07 ENCOUNTER — Ambulatory Visit: Payer: Self-pay | Admitting: Family Medicine

## 2024-04-07 ENCOUNTER — Other Ambulatory Visit (INDEPENDENT_AMBULATORY_CARE_PROVIDER_SITE_OTHER)

## 2024-04-07 DIAGNOSIS — M1A00X0 Idiopathic chronic gout, unspecified site, without tophus (tophi): Secondary | ICD-10-CM | POA: Diagnosis not present

## 2024-04-07 DIAGNOSIS — R7303 Prediabetes: Secondary | ICD-10-CM

## 2024-04-07 DIAGNOSIS — D649 Anemia, unspecified: Secondary | ICD-10-CM

## 2024-04-07 DIAGNOSIS — E785 Hyperlipidemia, unspecified: Secondary | ICD-10-CM

## 2024-04-07 DIAGNOSIS — R748 Abnormal levels of other serum enzymes: Secondary | ICD-10-CM | POA: Diagnosis not present

## 2024-04-07 LAB — CBC WITH DIFFERENTIAL/PLATELET
Basophils Absolute: 0 K/uL (ref 0.0–0.1)
Basophils Relative: 0.9 % (ref 0.0–3.0)
Eosinophils Absolute: 0.3 K/uL (ref 0.0–0.7)
Eosinophils Relative: 7.6 % — ABNORMAL HIGH (ref 0.0–5.0)
HCT: 36.3 % — ABNORMAL LOW (ref 39.0–52.0)
Hemoglobin: 12.3 g/dL — ABNORMAL LOW (ref 13.0–17.0)
Lymphocytes Relative: 21.3 % (ref 12.0–46.0)
Lymphs Abs: 1 K/uL (ref 0.7–4.0)
MCHC: 33.7 g/dL (ref 30.0–36.0)
MCV: 95.2 fl (ref 78.0–100.0)
Monocytes Absolute: 0.5 K/uL (ref 0.1–1.0)
Monocytes Relative: 11.7 % (ref 3.0–12.0)
Neutro Abs: 2.7 K/uL (ref 1.4–7.7)
Neutrophils Relative %: 58.5 % (ref 43.0–77.0)
Platelets: 252 K/uL (ref 150.0–400.0)
RBC: 3.81 Mil/uL — ABNORMAL LOW (ref 4.22–5.81)
RDW: 14.8 % (ref 11.5–15.5)
WBC: 4.6 K/uL (ref 4.0–10.5)

## 2024-04-07 LAB — CK: Total CK: 103 U/L (ref 17–232)

## 2024-04-07 LAB — COMPREHENSIVE METABOLIC PANEL WITH GFR
ALT: 17 U/L (ref 0–53)
AST: 20 U/L (ref 0–37)
Albumin: 4.2 g/dL (ref 3.5–5.2)
Alkaline Phosphatase: 46 U/L (ref 39–117)
BUN: 21 mg/dL (ref 6–23)
CO2: 29 meq/L (ref 19–32)
Calcium: 8.9 mg/dL (ref 8.4–10.5)
Chloride: 105 meq/L (ref 96–112)
Creatinine, Ser: 1.08 mg/dL (ref 0.40–1.50)
GFR: 60.51 mL/min (ref >60.00)
Glucose, Bld: 95 mg/dL (ref 70–99)
Potassium: 4.1 meq/L (ref 3.5–5.1)
Sodium: 142 meq/L (ref 135–145)
Total Bilirubin: 0.6 mg/dL (ref 0.2–1.2)
Total Protein: 6.5 g/dL (ref 6.0–8.3)

## 2024-04-07 LAB — LIPID PANEL
Cholesterol: 136 mg/dL (ref 0–200)
HDL: 36.5 mg/dL — ABNORMAL LOW (ref >39.00)
LDL Cholesterol: 72 mg/dL (ref 0–99)
NonHDL: 99.18
Total CHOL/HDL Ratio: 4
Triglycerides: 136 mg/dL (ref 0.0–149.0)
VLDL: 27.2 mg/dL (ref 0.0–40.0)

## 2024-04-07 LAB — HEMOGLOBIN A1C: Hgb A1c MFr Bld: 5.4 % (ref 4.6–6.5)

## 2024-04-07 LAB — URIC ACID: Uric Acid, Serum: 4.4 mg/dL (ref 4.0–7.8)

## 2024-04-14 ENCOUNTER — Ambulatory Visit: Admitting: Family Medicine

## 2024-04-14 ENCOUNTER — Encounter: Payer: Self-pay | Admitting: Family Medicine

## 2024-04-14 VITALS — BP 140/80 | HR 60 | Temp 97.6°F | Ht 69.0 in | Wt 209.2 lb

## 2024-04-14 DIAGNOSIS — R748 Abnormal levels of other serum enzymes: Secondary | ICD-10-CM

## 2024-04-14 DIAGNOSIS — K5909 Other constipation: Secondary | ICD-10-CM

## 2024-04-14 DIAGNOSIS — R454 Irritability and anger: Secondary | ICD-10-CM

## 2024-04-14 DIAGNOSIS — R001 Bradycardia, unspecified: Secondary | ICD-10-CM

## 2024-04-14 DIAGNOSIS — M1A00X0 Idiopathic chronic gout, unspecified site, without tophus (tophi): Secondary | ICD-10-CM

## 2024-04-14 DIAGNOSIS — I10 Essential (primary) hypertension: Secondary | ICD-10-CM

## 2024-04-14 DIAGNOSIS — K219 Gastro-esophageal reflux disease without esophagitis: Secondary | ICD-10-CM

## 2024-04-14 DIAGNOSIS — E66811 Obesity, class 1: Secondary | ICD-10-CM

## 2024-04-14 DIAGNOSIS — G4733 Obstructive sleep apnea (adult) (pediatric): Secondary | ICD-10-CM

## 2024-04-14 DIAGNOSIS — I6523 Occlusion and stenosis of bilateral carotid arteries: Secondary | ICD-10-CM

## 2024-04-14 DIAGNOSIS — E785 Hyperlipidemia, unspecified: Secondary | ICD-10-CM | POA: Diagnosis not present

## 2024-04-14 DIAGNOSIS — Z0000 Encounter for general adult medical examination without abnormal findings: Secondary | ICD-10-CM

## 2024-04-14 DIAGNOSIS — Z7189 Other specified counseling: Secondary | ICD-10-CM

## 2024-04-14 DIAGNOSIS — Z23 Encounter for immunization: Secondary | ICD-10-CM | POA: Diagnosis not present

## 2024-04-14 DIAGNOSIS — G3184 Mild cognitive impairment, so stated: Secondary | ICD-10-CM | POA: Diagnosis not present

## 2024-04-14 DIAGNOSIS — Z8673 Personal history of transient ischemic attack (TIA), and cerebral infarction without residual deficits: Secondary | ICD-10-CM

## 2024-04-14 DIAGNOSIS — R7303 Prediabetes: Secondary | ICD-10-CM

## 2024-04-14 DIAGNOSIS — S98131A Complete traumatic amputation of one right lesser toe, initial encounter: Secondary | ICD-10-CM

## 2024-04-14 DIAGNOSIS — Z79899 Other long term (current) drug therapy: Secondary | ICD-10-CM | POA: Insufficient documentation

## 2024-04-14 DIAGNOSIS — D649 Anemia, unspecified: Secondary | ICD-10-CM

## 2024-04-14 MED ORDER — LOSARTAN POTASSIUM 50 MG PO TABS: 50.0000 mg | ORAL_TABLET | Freq: Every day | ORAL | 3 refills | Status: AC

## 2024-04-14 MED ORDER — HYDRALAZINE HCL 50 MG PO TABS: 50.0000 mg | ORAL_TABLET | Freq: Two times a day (BID) | ORAL | 3 refills | Status: AC

## 2024-04-14 MED ORDER — DONEPEZIL HCL 10 MG PO TABS: 10.0000 mg | ORAL_TABLET | Freq: Every day | ORAL | 3 refills | Status: AC

## 2024-04-14 MED ORDER — MEMANTINE HCL 5 MG PO TABS: 5.0000 mg | ORAL_TABLET | Freq: Two times a day (BID) | ORAL | 3 refills | Status: AC

## 2024-04-14 MED ORDER — AMLODIPINE BESYLATE 10 MG PO TABS: 10.0000 mg | ORAL_TABLET | Freq: Every day | ORAL | 3 refills | Status: AC

## 2024-04-14 MED ORDER — PRAVASTATIN SODIUM 20 MG PO TABS: 20.0000 mg | ORAL_TABLET | ORAL | 3 refills | Status: AC

## 2024-04-14 MED ORDER — CLOPIDOGREL BISULFATE 75 MG PO TABS: 75.0000 mg | ORAL_TABLET | Freq: Every day | ORAL | 3 refills | Status: AC

## 2024-04-14 NOTE — Assessment & Plan Note (Signed)
 Continues nightly CPAP followed by neurology.

## 2024-04-14 NOTE — Patient Instructions (Addendum)
 Prevnar-20 today  Try off pantoprazole , monitor effect on heartburn.  Try increased dose of donepezil  to 10mg  nightly - take 2 at night until you run out, new dose will be 10mg  tablets.  Finish up sertraline  prescription then trial off this medicine. Consider shingles shots.  Good to see you today. Return as needed or in 4 months for follow up visit.

## 2024-04-14 NOTE — Assessment & Plan Note (Signed)
 They continue working on this, will bring me copy when complete.

## 2024-04-14 NOTE — Assessment & Plan Note (Signed)
 Stable period with daily miralax .

## 2024-04-14 NOTE — Assessment & Plan Note (Addendum)
 Pt and wife note progression of memory difficulty.  ?vascular vs other Med list reviewed, recommendations made. He had been taking tylenol  PM as needed during day for joint pains - will switch to plain tylenol .  Increase aricept  to 10mg  nightly, monitoring for bradycardia, arrhythmias, GI upset.  Continue namenda  5mg  bid (started 12/2023).

## 2024-04-14 NOTE — Assessment & Plan Note (Signed)
Latest A1c in normal range.

## 2024-04-14 NOTE — Assessment & Plan Note (Addendum)
 Will remain off beta blockers, monitor effect of increased donepezil  dose.

## 2024-04-14 NOTE — Assessment & Plan Note (Signed)
 Chronic, stable on current regimen. He does not have Toprol  XL in his medications he brings today.  Due to HR 60s, will stay off at this time. Continue losartan , amlodipine , hydralazine  50mg  bid.

## 2024-04-14 NOTE — Assessment & Plan Note (Signed)
 Remains mildly anemic, stable.

## 2024-04-14 NOTE — Assessment & Plan Note (Signed)
 Atorvastatin  raised CPK. Tolerating pravastatin  twice weekly - continue this. The ASCVD Risk score (Arnett DK, et al., 2019) failed to calculate for the following reasons:   The 2019 ASCVD risk score is only valid for ages 55 to 47   Risk score cannot be calculated because patient has a medical history suggesting prior/existing ASCVD

## 2024-04-14 NOTE — Assessment & Plan Note (Addendum)
 SSRI started 09/2023 due to worsened irritability.  Overall stable period - will trial off sertraline  as he desires to minimize medications

## 2024-04-14 NOTE — Assessment & Plan Note (Addendum)
 Longterm PPI for years.  No significant GERD symptoms although he is on plavix  and aspirin .  Will trial off PPI.

## 2024-04-14 NOTE — Assessment & Plan Note (Addendum)
 Encourage healthy diet and lifestyle choices to effect sustainable weight loss.

## 2024-04-14 NOTE — Progress Notes (Signed)
 Ph: [phone] Fax: [phone]   Patient ID: [name], male    DOB: 1932/01/23, 89 y.o.   MRN: [id number]  This visit was conducted in person.  BP (!) 140/80 (Cuff Size: Normal)   Pulse 60   Temp 97.6 F (36.4 C) (Oral)   Ht 5' 9 (1.753 m)   Wt 209 lb 3.2 oz (94.9 kg)   SpO2 95%   BMI 30.89 kg/m    CC: CPE Subjective:   HPI: [name] is a 89 y.o. male presenting on 04/14/2024 for Annual Exam (Pt wants to discuss medications, states he doesn't think he needs all of them, //Mrs. Paul)   Saw health advisor 02/2024 for medicare wellness visit. Note reviewed.   No results found.  Flowsheet Row Office Visit from 04/14/2024 in [facility] [place]  PHQ-2 Total Score 0       04/14/2024   12:37 PM 03/05/2024    8:20 AM 12/31/2023    2:43 PM 11/14/2023   11:25 AM 09/28/2023    2:07 PM  Fall Risk   Falls in the past year? 0 1 0 1 1  Comment  tripped at home     Number falls in past yr: 0 0 0  0  Injury with Fall? 0 0  0   0   Risk for fall due to :  Impaired balance/gait No Fall Risks Other (Comment)   Risk for fall due to: Comment  uses cane sometimes     Follow up Falls evaluation completed Education provided;Falls prevention discussed Falls evaluation completed Falls evaluation completed      Data saved with a previous flowsheet row definition   [name] Recent BCC to nasal bridge  Hospitalized 09/2023 for TIA with subsequent permissive hypertension. Imaging showed chronic cerebellar strokes and significant large vessel plaque/occlusions.  Subsequent hospitalization 10/2023 for gallstone pancreatitis s/p lap chole.   OSA on CPAP followed by [facility].  Last saw neurology Dr Buck 11/2023 for OSA on CPAP and f/u TIA and MCI. Was recommended to undergo formal driving test to assess driving safety. He does not want to do this. Continues donepezil  5mg  nightly as well as sertraline .   MRI brain imaging from 01/2022 showed ischemic  nonhemorrhagic infarct of central cerebellum, treated with DAPT x 3 mo then plavix  alone. Aspirin  stopped 04/2022. Found to have RICA 70%, LICA 50% stenosis, as well as R subclavian stenosis 85% at origin and occluded L vertebral artery - thought noncontributory.   He notes he's not taking Toprol  XL (25mg  daily)   He stopped allopurinol  early 2024. No recurrent gout flares.    Preventative: Colon cancer screening - normal colonoscopies, last ~2009 - aged out  Prostate cancer screening - age out  Lung cancer screening - not eligible  Flu shot - yearly  COVID vaccine - Pfizer 06/2019, 07/2019, booster 02/2020, bivalent booster 01/2021 Tdap 2014 Pneumovax 2012, Prevnar-13 2016, prevnar-20 today  zostavax 2009 shingrix  - discussed, to check at pharmacy  Advanced directive discussion - needs updating, they are working on this. Would want wife to be HCPOA. Packet previously provided.  Seat belt use discussed.  Sunscreen use discussed. No changing moles on skin. Sees derm regularly.  Non smoker. Significant second hand smoker.  Alcohol - was heavy drinker, has cut down significantly - 1 glass of wine ivery per month  Dentist q6 mo  Eye exam yearly ([place])  Bowel - constipation managed with daily miralax   Bladder -  no incontinence, he's cut down on caffeine intake.   Widower. 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 Interim medical history since our last visit reviewed. Allergies and medications reviewed and updated. Outpatient Medications Prior to Visit  Medication Sig Dispense Refill   aspirin  EC 81 MG tablet Take 1 tablet (81 mg total) by mouth daily. Swallow whole. 30  tablet 12   azelastine  (ASTELIN ) 0.1 % nasal spray Place 1 spray into both nostrils 2 (two) times daily. Use in each nostril as directed 30 mL 12   cetirizine  (ZYRTEC ) 10 MG tablet Take 10 mg by mouth daily. Alternates weekly with allegra. OTC     Coenzyme Q10-Vitamin E  (QUNOL ULTRA COQ10 PO) Take 1 capsule by mouth daily. OTC     cyanocobalamin  (VITAMIN B12) 1000 MCG tablet Take 1 tablet (1,000 mcg total) by mouth daily. (Patient taking differently: Take 1,000 mcg by mouth daily. OTC)     fexofenadine (ALLEGRA) 180 MG tablet Take 180 mg by mouth.     Misc Natural Products (OSTEO BI-FLEX TRIPLE STRENGTH) TABS Take 1 tablet by mouth daily. + vitamin D . OTC     Multiple Vitamins-Minerals (ONE-A-DAY MENS 50+) TABS Take 1 tablet by mouth daily. OTC     polyethylene glycol (MIRALAX  / GLYCOLAX ) packet Take 8.5 g by mouth daily.      amLODipine  (NORVASC ) 10 MG tablet Take 1 tablet (10 mg total) by mouth daily. 90 tablet 4   clopidogrel  (PLAVIX ) 75 MG tablet Take 1 tablet (75 mg total) by mouth daily. 90 tablet 4   donepezil  (ARICEPT ) 5 MG tablet Take 1 tablet (5 mg total) by mouth at bedtime. 90 tablet 4   hydrALAZINE  (APRESOLINE ) 50 MG tablet Take 1 tablet (50 mg total) by mouth in the morning and at bedtime.     losartan  (COZAAR ) 50 MG tablet TAKE 1 TABLET(50 MG) BY MOUTH DAILY 90 tablet 0   metoprolol  succinate (TOPROL -XL) 25 MG 24 hr tablet TAKE 1 TABLET BY MOUTH DAILY WITH OR IMMEDIATELY FOLLOWING A MEAL     pantoprazole  (PROTONIX ) 20 MG tablet Take 1 tablet (20 mg total) by mouth every other day.     pravastatin  (PRAVACHOL ) 20 MG tablet Take 1 tablet (20 mg total) by mouth 2 (two) times a week. 32 tablet 0   sertraline  (ZOLOFT ) 25 MG tablet TAKE 1 TABLET(25 MG) BY MOUTH DAILY 30 tablet 2   sertraline  (ZOLOFT ) 25 MG tablet Take 1 tablet (25 mg total) by mouth daily.     triamcinolone  cream (KENALOG ) 0.1 % Apply to affected areas rash on right hip twice daily until improved. Avoid applying to face,  groin, and axilla. Use as directed. Long-term use can cause thinning of the skin. 80 g 0   memantine  (NAMENDA ) 5 MG tablet Take 1 tablet (5 mg total) by mouth at bedtime for 7 days, THEN 1 tablet (5 mg total) 2 (two) times daily. 60 tablet 3   No facility-administered medications prior to visit.     Per HPI unless specifically indicated in ROS section below Review of Systems  Constitutional:  Negative for activity change, appetite change, chills, fatigue, fever and unexpected weight change.  HENT:  Negative  for hearing loss.   Eyes:  Negative for visual disturbance.  Respiratory:  Negative for cough, chest tightness, shortness of breath and wheezing.   Cardiovascular:  Negative for chest pain, palpitations and leg swelling.  Gastrointestinal:  Negative for abdominal distention, abdominal pain, blood in stool, constipation, diarrhea, nausea and vomiting.  Genitourinary:  Negative for difficulty urinating and hematuria.  Musculoskeletal:  Negative for arthralgias, myalgias and neck pain.  Skin:  Negative for rash.  Neurological:  Negative for dizziness, seizures, syncope and headaches.  Hematological:  Negative for adenopathy. Bruises/bleeds easily.  Psychiatric/Behavioral:  Negative for dysphoric mood. The patient is not nervous/anxious.     Objective:  BP (!) 140/80 (Cuff Size: Normal)   Pulse 60   Temp 97.6 F (36.4 C) (Oral)   Ht 5' 9 (1.753 m)   Wt 209 lb 3.2 oz (94.9 kg)   SpO2 95%   BMI 30.89 kg/m   Wt Readings from Last 3 Encounters:  04/14/24 209 lb 3.2 oz (94.9 kg)  03/05/24 203 lb (92.1 kg)  12/31/23 203 lb (92.1 kg)      Physical Exam Vitals and nursing note reviewed.  Constitutional:      General: He is not in acute distress.    Appearance: Normal appearance. He is well-developed. He is not ill-appearing.  HENT:     Head: Normocephalic and atraumatic.     Right Ear: Hearing, tympanic membrane, ear canal and external ear normal.     Left Ear: Hearing, tympanic  membrane, ear canal and external ear normal.     Nose: Nose normal.     Mouth/Throat:     Mouth: Mucous membranes are moist.     Pharynx: Oropharynx is clear. No oropharyngeal exudate or posterior oropharyngeal erythema.  Eyes:     General: No scleral icterus.    Extraocular Movements: Extraocular movements intact.     Conjunctiva/sclera: Conjunctivae normal.     Pupils: Pupils are equal, round, and reactive to light.  Neck:     Thyroid : No thyroid  mass or thyromegaly.     Vascular: Carotid bruit (R>L) present.  Cardiovascular:     Rate and Rhythm: Normal rate and regular rhythm.     Pulses: Normal pulses.          Radial pulses are 2+ on the right side and 2+ on the left side.     Heart sounds: Normal heart sounds. No murmur heard. Pulmonary:     Effort: Pulmonary effort is normal. No respiratory distress.     Breath sounds: Normal breath sounds. No wheezing, rhonchi or rales.  Abdominal:     General: Bowel sounds are normal. There is no distension.     Palpations: Abdomen is soft. There is no mass.     Tenderness: There is no abdominal tenderness. There is no guarding or rebound.     Hernia: No hernia is present.  Musculoskeletal:        General: Normal range of motion.     Cervical back: Normal range of motion and neck supple.     Right lower leg: No edema.     Left lower leg: No edema.  Lymphadenopathy:     Cervical: No cervical adenopathy.  Skin:    General: Skin is warm and dry.     Findings: No rash.  Neurological:     General: No focal deficit present.     Mental Status: He is alert and oriented to person, place, and time.  Psychiatric:  Mood and Affect: Mood normal.        Behavior: Behavior normal.        Thought Content: Thought content normal.        Judgment: Judgment normal.       Results for orders placed or performed in visit on 04/07/24  CK   Collection Time: 04/07/24  7:54 AM  Result Value Ref Range   Total CK 103 17 - 232 U/L  Uric acid    Collection Time: 04/07/24  7:54 AM  Result Value Ref Range   Uric Acid, Serum 4.4 4.0 - 7.8 mg/dL  Hemoglobin J8r   Collection Time: 04/07/24  7:54 AM  Result Value Ref Range   Hgb A1c MFr Bld 5.4 4.6 - 6.5 %  CBC with Differential/Platelet   Collection Time: 04/07/24  7:54 AM  Result Value Ref Range   WBC 4.6 4.0 - 10.5 K/uL   RBC 3.81 (L) 4.22 - 5.81 Mil/uL   Hemoglobin 12.3 (L) 13.0 - 17.0 g/dL   HCT 63.6 (L) 60.9 - 47.9 %   MCV 95.2 78.0 - 100.0 fl   MCHC 33.7 30.0 - 36.0 g/dL   RDW 85.1 88.4 - 84.4 %   Platelets 252.0 150.0 - 400.0 K/uL   Neutrophils Relative % 58.5 43.0 - 77.0 %   Lymphocytes Relative 21.3 12.0 - 46.0 %   Monocytes Relative 11.7 3.0 - 12.0 %   Eosinophils Relative 7.6 (H) 0.0 - 5.0 %   Basophils Relative 0.9 0.0 - 3.0 %   Neutro Abs 2.7 1.4 - 7.7 K/uL   Lymphs Abs 1.0 0.7 - 4.0 K/uL   Monocytes Absolute 0.5 0.1 - 1.0 K/uL   Eosinophils Absolute 0.3 0.0 - 0.7 K/uL   Basophils Absolute 0.0 0.0 - 0.1 K/uL  Comprehensive metabolic panel with GFR   Collection Time: 04/07/24  7:54 AM  Result Value Ref Range   Sodium 142 135 - 145 mEq/L   Potassium 4.1 3.5 - 5.1 mEq/L   Chloride 105 96 - 112 mEq/L   CO2 29 19 - 32 mEq/L   Glucose, Bld 95 70 - 99 mg/dL   BUN 21 6 - 23 mg/dL   Creatinine, Ser 8.91 0.40 - 1.50 mg/dL   Total Bilirubin 0.6 0.2 - 1.2 mg/dL   Alkaline Phosphatase 46 39 - 117 U/L   AST 20 0 - 37 U/L   ALT 17 0 - 53 U/L   Total Protein 6.5 6.0 - 8.3 g/dL   Albumin 4.2 3.5 - 5.2 g/dL   GFR 39.48 >39.99 mL/min   Calcium  8.9 8.4 - 10.5 mg/dL  Lipid panel   Collection Time: 04/07/24  7:54 AM  Result Value Ref Range   Cholesterol 136 0 - 200 mg/dL   Triglycerides 863.9 0.0 - 149.0 mg/dL   HDL 63.49 (L) >60.99 mg/dL   VLDL 72.7 0.0 - 59.9 mg/dL   LDL Cholesterol 72 0 - 99 mg/dL   Total CHOL/HDL Ratio 4    NonHDL 99.18     Assessment & Plan:   Problem List Items Addressed This Visit     Advanced care planning/counseling discussion  (Chronic)   They continue working on this, will bring me copy when complete.       Health maintenance examination - Primary (Chronic)   Preventative protocols reviewed and updated unless pt declined. Discussed healthy diet and lifestyle.       GERD (gastroesophageal reflux disease)   Longterm PPI for years.  No significant  GERD symptoms although he is on plavix  and aspirin .  Will trial off PPI.       Gout   Urate levels normal off allopurinol , no recent gout flare. He notes he has significantly cut down on alcohol intake.       HTN (hypertension)   Chronic, stable on current regimen. He does not have Toprol  XL in his medications he brings today.  Due to HR 60s, will stay off at this time. Continue losartan , amlodipine , hydralazine  50mg  bid.       Relevant Medications   amLODipine  (NORVASC ) 10 MG tablet   hydrALAZINE  (APRESOLINE ) 50 MG tablet   losartan  (COZAAR ) 50 MG tablet   pravastatin  (PRAVACHOL ) 20 MG tablet   OSA on CPAP   Continues nightly CPAP followed by neurology.       Obesity, Class I, BMI 30.0-34.9 (see actual BMI)   Encourage healthy diet and lifestyle choices to effect sustainable weight loss.       Carotid stenosis, asymptomatic, bilateral   Relevant Medications   amLODipine  (NORVASC ) 10 MG tablet   clopidogrel  (PLAVIX ) 75 MG tablet   hydrALAZINE  (APRESOLINE ) 50 MG tablet   losartan  (COZAAR ) 50 MG tablet   pravastatin  (PRAVACHOL ) 20 MG tablet   Dyslipidemia   Atorvastatin  raised CPK. Tolerating pravastatin  twice weekly - continue this. The ASCVD Risk score (Arnett DK, et al., 2019) failed to calculate for the following reasons:   The 2019 ASCVD risk score is only valid for ages 38 to 26   Risk score cannot be calculated because patient has a medical history suggesting prior/existing ASCVD       Relevant Medications   pravastatin  (PRAVACHOL ) 20 MG tablet   Amputation of one or more toes   MCI (mild cognitive impairment)   Pt and wife note  progression of memory difficulty.  ?vascular vs other Med list reviewed, recommendations made. He had been taking tylenol  PM as needed during day for joint pains - will switch to plain tylenol .  Increase aricept  to 10mg  nightly, monitoring for bradycardia, arrhythmias, GI upset.  Continue namenda  5mg  bid (started 12/2023).      Relevant Medications   donepezil  (ARICEPT ) 10 MG tablet   Anemia   Remains mildly anemic, stable.       History of cerebellar stroke   Prediabetes   Latest A1c in normal range.      Sinus bradycardia   Will remain off beta blockers, monitor effect of increased donepezil  dose.       Relevant Medications   amLODipine  (NORVASC ) 10 MG tablet   hydrALAZINE  (APRESOLINE ) 50 MG tablet   losartan  (COZAAR ) 50 MG tablet   pravastatin  (PRAVACHOL ) 20 MG tablet   Elevated CK   CPK stable on twice weekly pravastatin   Previously increased when on atorvastatin       Chronic constipation   Stable period with daily miralax .       Irritability   SSRI started 09/2023 due to worsened irritability.  Overall stable period - will trial off sertraline  as he desires to minimize medications      Relevant Medications   sertraline  (ZOLOFT ) 25 MG tablet   Polypharmacy   Medications including supplements reviewed with pt/wife, recommendations made       Other Visit Diagnoses       Need for vaccination against Streptococcus pneumoniae       Relevant Orders   Pneumococcal conjugate vaccine 20-valent (Prevnar 20) (Completed)        Meds ordered this encounter  Medications  donepezil  (ARICEPT ) 10 MG tablet    Sig: Take 1 tablet (10 mg total) by mouth at bedtime.    Dispense:  90 tablet    Refill:  3    Note new dose   memantine  (NAMENDA ) 5 MG tablet    Sig: Take 1 tablet (5 mg total) by mouth 2 (two) times daily.    Dispense:  180 tablet    Refill:  3   amLODipine  (NORVASC ) 10 MG tablet    Sig: Take 1 tablet (10 mg total) by mouth daily.    Dispense:  90  tablet    Refill:  3   clopidogrel  (PLAVIX ) 75 MG tablet    Sig: Take 1 tablet (75 mg total) by mouth daily.    Dispense:  90 tablet    Refill:  3   hydrALAZINE  (APRESOLINE ) 50 MG tablet    Sig: Take 1 tablet (50 mg total) by mouth in the morning and at bedtime.    Dispense:  180 tablet    Refill:  3   losartan  (COZAAR ) 50 MG tablet    Sig: Take 1 tablet (50 mg total) by mouth daily.    Dispense:  90 tablet    Refill:  3   pravastatin  (PRAVACHOL ) 20 MG tablet    Sig: Take 1 tablet (20 mg total) by mouth 2 (two) times a week.    Dispense:  32 tablet    Refill:  3    Orders Placed This Encounter  Procedures   Pneumococcal conjugate vaccine 20-valent (Prevnar 20)    Patient Instructions  Prevnar-20 today  Try off pantoprazole , monitor effect on heartburn.  Try increased dose of donepezil  to 10mg  nightly - take 2 at night until you run out, new dose will be 10mg  tablets.  Finish up sertraline  prescription then trial off this medicine. Consider shingles shots.  Good to see you today. Return as needed or in 4 months for follow up visit.   Follow up plan: Return in about 4 months (around 08/13/2024) for follow up visit.  Anton Blas, MD

## 2024-04-14 NOTE — Assessment & Plan Note (Signed)
 Preventative protocols reviewed and updated unless pt declined. Discussed healthy diet and lifestyle.

## 2024-04-14 NOTE — Assessment & Plan Note (Signed)
 Medications including supplements reviewed with pt/wife, recommendations made

## 2024-04-14 NOTE — Assessment & Plan Note (Signed)
 Urate levels normal off allopurinol , no recent gout flare. He notes he has significantly cut down on alcohol intake.

## 2024-04-14 NOTE — Assessment & Plan Note (Signed)
 CPK stable on twice weekly pravastatin   Previously increased when on atorvastatin 

## 2024-05-19 ENCOUNTER — Telehealth: Payer: Self-pay

## 2024-05-19 DIAGNOSIS — L84 Corns and callosities: Secondary | ICD-10-CM

## 2024-05-19 DIAGNOSIS — S98131A Complete traumatic amputation of one right lesser toe, initial encounter: Secondary | ICD-10-CM

## 2024-05-19 NOTE — Telephone Encounter (Unsigned)
 Copied from CRM [id number]. Topic: Referral - Request for Referral >> May 19, 2024 10:09 AM Chasity T wrote: Did the patient discuss referral with their provider in the last year? No  Appointment offered? No  Type of order/referral and detailed reason for visit: Toe infection from toe removal, need an referral for appointment tomorrow  Preference of office, provider, location: Thresa Sar, [facility], [address] [place]  If referral order, have you been seen by this specialty before? No (If Yes, this issue or another issue? When? Where?  Can we respond through MyChart? Yes (prefer phone call) >> May 19, 2024 10:13 AM Chasity T wrote: Patient has an appointment tomorrow and office states he may need an referral to be seen.

## 2024-05-20 ENCOUNTER — Other Ambulatory Visit: Payer: Self-pay | Admitting: Family Medicine

## 2024-05-20 ENCOUNTER — Ambulatory Visit: Admitting: Podiatry

## 2024-05-20 VITALS — Ht 69.0 in | Wt 209.2 lb

## 2024-05-20 DIAGNOSIS — J3089 Other allergic rhinitis: Secondary | ICD-10-CM

## 2024-05-20 DIAGNOSIS — L03031 Cellulitis of right toe: Secondary | ICD-10-CM | POA: Diagnosis not present

## 2024-05-20 MED ORDER — MUPIROCIN 2 % EX OINT: 1.0000 | TOPICAL_OINTMENT | Freq: Two times a day (BID) | CUTANEOUS | 0 refills | Status: AC

## 2024-05-20 NOTE — Telephone Encounter (Signed)
Plz notify new referral placed

## 2024-05-21 NOTE — Telephone Encounter (Signed)
 Mychart sent to patient.

## 2024-06-03 ENCOUNTER — Ambulatory Visit: Admitting: Podiatry

## 2024-06-03 DIAGNOSIS — L03031 Cellulitis of right toe: Secondary | ICD-10-CM | POA: Diagnosis not present

## 2024-06-03 NOTE — Progress Notes (Signed)
" ° °  Chief Complaint  Patient presents with   Nail Problem    Pt is here to f/u on right great toenail after removal, states no pain or drainage, happy with results.    Subjective: 89 y.o. male presents today status post total temporary nail avulsion procedure of the right hallux nail plate that was performed on 05/20/2024.   Past Medical History:  Diagnosis Date   Actinic keratosis    AV block, 1st degree 10/05/2021   With bradycardia 09/2021 - drop Toprol  XL to 25mg  daily, increase amlodipine  to 10mg  daily. Donepezil  can also cause AV block.      BCC (basal cell carcinoma of skin) 12/05/2023   nasal bridge - treated with ED&C   Carotid stenosis, asymptomatic, bilateral 09/16/2016   Diffuse 1-39% bilaterally, but involving ICA, ECA, CCA - rpt 1 yr (09/2016)   Complication of anesthesia    slow to wake up after a colonoscopy years ago   Diverticulitis    Dyslipidemia 09/04/2017   Atorvastatin  elevated CPK levels.      GERD (gastroesophageal reflux disease)    Gout    History of cerebellar stroke 01/09/2022   Cerebellar stroke     History of osteomyelitis 10/2015   left foot   History of squamous cell carcinoma of skin    HTN (hypertension)    Infection of left great toe due to methicillin resistant Staphylococcus aureus (MRSA) 2015   Hx s/p IV abx 7 wks through PICC (~2015)     MCI (mild cognitive impairment) 02/17/2020   MMSE (02/2020) 24/30, 25 with cue. Misses 3 orientation, 2 recall (1 with cue), 1 language. Independent ADLs and IADLs. 4/4 clock drawing test.   MMSE 08/2021 23/30, CDT 4/4     OSA on CPAP 09/01/2016   Osteoarthritis    back, neck, knees - multiple joints   Personal history of malignant neoplasm of larynx 2009   s/p XRT, Skin Cancer- pre cancerous   Pre-diabetes    Prediabetes 01/09/2022   Seasonal allergies    Sensorineural hearing loss (SNHL) of both ears 09/01/2016   Sinus bradycardia 02/07/2022   Sinus bradycardia     Squamous cell carcinoma of skin  unknown   Treated in [place] - SCC of the L post auricular    Squamous cell carcinoma of skin 10/26/2022   R med knee, EDC    Objective: Neurovascular status intact.  Skin is warm, dry and supple.  The nailbed appears to be healing nicely.  There is no open wound.  Assessment: #1 s/p total temporary nail avulsion right hallux nail plate   Plan of care: #1 patient was evaluated  #2 light debridement of the periungual debris was performed to the border of the respective toe to remove some of the callus tissue around the area #3  Full activity no restrictions  #4 return to clinic PRN   Thresa EMERSON Sar, DPM [facility]  Dr. Thresa EMERSON Sar, DPM    [address] [place], [place] [address]                Office [phone]  Fax [phone]     "

## 2024-06-03 NOTE — Progress Notes (Signed)
 "  Chief Complaint  Patient presents with   Nail Problem    Right great toenail lifted, may need to come, causing some pain. He is not a diabetic.    HPI: 89 y.o. malepresenting for above complaint  Past Medical History:  Diagnosis Date   Actinic keratosis    AV block, 1st degree 10/05/2021   With bradycardia 09/2021 - drop Toprol  XL to 25mg  daily, increase amlodipine  to 10mg  daily. Donepezil  can also cause AV block.      BCC (basal cell carcinoma of skin) 12/05/2023   nasal bridge - treated with ED&C   Carotid stenosis, asymptomatic, bilateral 09/16/2016   Diffuse 1-39% bilaterally, but involving ICA, ECA, CCA - rpt 1 yr (09/2016)   Complication of anesthesia    slow to wake up after a colonoscopy years ago   Diverticulitis    Dyslipidemia 09/04/2017   Atorvastatin  elevated CPK levels.      GERD (gastroesophageal reflux disease)    Gout    History of cerebellar stroke 01/09/2022   Cerebellar stroke     History of osteomyelitis 10/2015   left foot   History of squamous cell carcinoma of skin    HTN (hypertension)    Infection of left great toe due to methicillin resistant Staphylococcus aureus (MRSA) 2015   Hx s/p IV abx 7 wks through PICC (~2015)     MCI (mild cognitive impairment) 02/17/2020   MMSE (02/2020) 24/30, 25 with cue. Misses 3 orientation, 2 recall (1 with cue), 1 language. Independent ADLs and IADLs. 4/4 clock drawing test.   MMSE 08/2021 23/30, CDT 4/4     OSA on CPAP 09/01/2016   Osteoarthritis    back, neck, knees - multiple joints   Personal history of malignant neoplasm of larynx 2009   s/p XRT, Skin Cancer- pre cancerous   Pre-diabetes    Prediabetes 01/09/2022   Seasonal allergies    Sensorineural hearing loss (SNHL) of both ears 09/01/2016   Sinus bradycardia 02/07/2022   Sinus bradycardia     Squamous cell carcinoma of skin unknown   Treated in [place] - SCC of the L post auricular    Squamous cell carcinoma of skin 10/26/2022   R med knee, EDC     Past Surgical History:  Procedure Laterality Date   ACHILLES TENDON REPAIR Left    AMPUTATION Left 05/16/2019   LEFT GREAT TOE AMPUTATION for osteomyelitis Claud, Jerona GAILS, MD), 2nd toe removed   APPENDECTOMY     CHOLECYSTECTOMY     [facility] physician   COLONOSCOPY     THROAT SURGERY N/A 2009   ?vocal cord vs larynx nodule removal, had XRT after this but states it was not cancer   TONSILLECTOMY     TREATMENT FISTULA ANAL      Allergies[1]   Physical Exam: General: The patient is alert and oriented x3 in no acute distress.  Dermatology: Skin is warm, dry and supple bilateral lower extremities.  Partially detached dystrophic nail noted to the right hallux nail plate with underlying debris.  Vascular: Palpable pedal pulses bilaterally. Capillary refill within normal limits.  No appreciable edema.  There is no surrounding erythema that would be concerning for deeper or spreading cellulitis  Neurological: Grossly intact via light touch  Musculoskeletal Exam: No pedal deformities noted.  Patient ambulatory   Assessment/Plan of Care: 1.  Partially detached dystrophic toenail right great toe with underlying debris  - Patient evaluated -After discussion with the patient I do believe  it would be beneficial to have the nail completely avulsed in its entirety.  This was explained in detail that it would be in his best interest to avulsed the toenail and clean any underlying debris or infection.  Mechanical debridement of the nail was performed and the nail was avulsed in its entirety.  Patient tolerated this well without anesthesia -The nailbed was cleansed and lightly debrided with antibiotic ointment and a light dressing applied -Prescription for mupirocin  ointment applied twice daily with under occlusion with a Band-Aid -Return to clinic 2 weeks   Thresa EMERSON Sar, DPM [facility]  Dr. Thresa EMERSON Sar, DPM    [address] [place], [place] [address]                Office [phone]  Fax [phone]        [1]  Allergies Allergen Reactions   Lisinopril Cough   Atorvastatin  Other (See Comments)    Myalgias and mildly elevated CPK    "

## 2024-06-18 ENCOUNTER — Ambulatory Visit: Admitting: Dermatology

## 2024-08-13 ENCOUNTER — Ambulatory Visit: Admitting: Family Medicine

## 2024-11-25 ENCOUNTER — Ambulatory Visit: Admitting: Family Medicine

## 2025-03-06 ENCOUNTER — Ambulatory Visit

## 2025-03-10 ENCOUNTER — Ambulatory Visit
